# Patient Record
Sex: Female | Born: 1935 | Race: White | Hispanic: No | Marital: Married | Smoking: Never smoker
Health system: Southern US, Community
[De-identification: ages and names within clinical notes are randomized; demographics above are authoritative.]

## PROBLEM LIST (undated history)

## (undated) ENCOUNTER — Ambulatory Visit: Admission: EM | Payer: Medicare (Managed Care)

## (undated) DIAGNOSIS — K900 Celiac disease: Secondary | ICD-10-CM

## (undated) DIAGNOSIS — I809 Phlebitis and thrombophlebitis of unspecified site: Secondary | ICD-10-CM

## (undated) DIAGNOSIS — I639 Cerebral infarction, unspecified: Secondary | ICD-10-CM

## (undated) DIAGNOSIS — I10 Essential (primary) hypertension: Secondary | ICD-10-CM

## (undated) DIAGNOSIS — F419 Anxiety disorder, unspecified: Secondary | ICD-10-CM

## (undated) DIAGNOSIS — E162 Hypoglycemia, unspecified: Secondary | ICD-10-CM

## (undated) DIAGNOSIS — R112 Nausea with vomiting, unspecified: Secondary | ICD-10-CM

## (undated) DIAGNOSIS — M1990 Unspecified osteoarthritis, unspecified site: Secondary | ICD-10-CM

## (undated) DIAGNOSIS — H269 Unspecified cataract: Secondary | ICD-10-CM

## (undated) DIAGNOSIS — E039 Hypothyroidism, unspecified: Secondary | ICD-10-CM

## (undated) DIAGNOSIS — Z79890 Hormone replacement therapy (postmenopausal): Secondary | ICD-10-CM

## (undated) DIAGNOSIS — H409 Unspecified glaucoma: Secondary | ICD-10-CM

## (undated) DIAGNOSIS — M419 Scoliosis, unspecified: Secondary | ICD-10-CM

## (undated) DIAGNOSIS — T7840XA Allergy, unspecified, initial encounter: Secondary | ICD-10-CM

## (undated) DIAGNOSIS — D099 Carcinoma in situ, unspecified: Secondary | ICD-10-CM

## (undated) DIAGNOSIS — K219 Gastro-esophageal reflux disease without esophagitis: Secondary | ICD-10-CM

## (undated) DIAGNOSIS — I6381 Other cerebral infarction due to occlusion or stenosis of small artery: Secondary | ICD-10-CM

## (undated) DIAGNOSIS — E785 Hyperlipidemia, unspecified: Secondary | ICD-10-CM

## (undated) DIAGNOSIS — D692 Other nonthrombocytopenic purpura: Secondary | ICD-10-CM

## (undated) DIAGNOSIS — N3281 Overactive bladder: Secondary | ICD-10-CM

## (undated) DIAGNOSIS — I7300 Raynaud's syndrome without gangrene: Secondary | ICD-10-CM

## (undated) DIAGNOSIS — M810 Age-related osteoporosis without current pathological fracture: Secondary | ICD-10-CM

## (undated) DIAGNOSIS — O2230 Deep phlebothrombosis in pregnancy, unspecified trimester: Secondary | ICD-10-CM

## (undated) DIAGNOSIS — J302 Other seasonal allergic rhinitis: Secondary | ICD-10-CM

## (undated) DIAGNOSIS — Z9889 Other specified postprocedural states: Secondary | ICD-10-CM

## (undated) DIAGNOSIS — E7800 Pure hypercholesterolemia, unspecified: Secondary | ICD-10-CM

## (undated) HISTORY — DX: Hormone replacement therapy: Z79.890

## (undated) HISTORY — DX: Other seasonal allergic rhinitis: J30.2

## (undated) HISTORY — DX: Essential (primary) hypertension: I10

## (undated) HISTORY — DX: Unspecified glaucoma: H40.9

## (undated) HISTORY — DX: Overactive bladder: N32.81

## (undated) HISTORY — DX: Raynaud's syndrome without gangrene: I73.00

## (undated) HISTORY — DX: Allergy, unspecified, initial encounter: T78.40XA

## (undated) HISTORY — DX: Age-related osteoporosis without current pathological fracture: M81.0

## (undated) HISTORY — DX: Cerebral infarction, unspecified: I63.9

## (undated) HISTORY — DX: Deep phlebothrombosis in pregnancy, unspecified trimester: O22.30

## (undated) HISTORY — PX: OTHER SURGICAL HISTORY: SHX169

## (undated) HISTORY — DX: Hyperlipidemia, unspecified: E78.5

## (undated) HISTORY — DX: Unspecified osteoarthritis, unspecified site: M19.90

## (undated) HISTORY — DX: Gastro-esophageal reflux disease without esophagitis: K21.9

## (undated) HISTORY — DX: Hypoglycemia, unspecified: E16.2

## (undated) HISTORY — DX: Celiac disease: K90.0

## (undated) HISTORY — DX: Unspecified cataract: H26.9

## (undated) HISTORY — DX: Anxiety disorder, unspecified: F41.9

## (undated) HISTORY — DX: Scoliosis, unspecified: M41.9

## (undated) HISTORY — DX: Hypothyroidism, unspecified: E03.9

## (undated) HISTORY — DX: Other nonthrombocytopenic purpura: D69.2

## (undated) HISTORY — PX: CATARACT EXTRACTION, BILATERAL: SHX1313

## (undated) HISTORY — PX: VARICOSE VEIN SURGERY: SHX832

## (undated) HISTORY — DX: Phlebitis and thrombophlebitis of unspecified site: I80.9

## (undated) HISTORY — DX: Other cerebral infarction due to occlusion or stenosis of small artery: I63.81

## (undated) HISTORY — DX: Pure hypercholesterolemia, unspecified: E78.00

## (undated) HISTORY — DX: Carcinoma in situ, unspecified: D09.9

---

## 1977-10-04 HISTORY — PX: ABDOMINAL HYSTERECTOMY: SUR658

## 1998-06-28 ENCOUNTER — Emergency Department (HOSPITAL_COMMUNITY): Admission: EM | Admit: 1998-06-28 | Discharge: 1998-06-28 | Payer: Self-pay | Admitting: Emergency Medicine

## 2002-07-05 ENCOUNTER — Encounter: Payer: Self-pay | Admitting: Chiropractic Medicine

## 2002-07-05 ENCOUNTER — Encounter: Admission: RE | Admit: 2002-07-05 | Discharge: 2002-07-05 | Payer: Self-pay | Admitting: Chiropractic Medicine

## 2003-02-06 ENCOUNTER — Encounter: Admission: RE | Admit: 2003-02-06 | Discharge: 2003-02-06 | Payer: Self-pay | Admitting: Chiropractic Medicine

## 2003-02-06 ENCOUNTER — Encounter: Payer: Self-pay | Admitting: Chiropractic Medicine

## 2008-01-30 DIAGNOSIS — D099 Carcinoma in situ, unspecified: Secondary | ICD-10-CM

## 2008-01-30 HISTORY — DX: Carcinoma in situ, unspecified: D09.9

## 2010-02-17 ENCOUNTER — Encounter: Admission: RE | Admit: 2010-02-17 | Discharge: 2010-02-17 | Payer: Self-pay | Admitting: *Deleted

## 2010-06-04 ENCOUNTER — Encounter: Admission: RE | Admit: 2010-06-04 | Discharge: 2010-06-04 | Payer: Self-pay | Admitting: Family Medicine

## 2012-09-29 ENCOUNTER — Encounter: Payer: Self-pay | Admitting: Internal Medicine

## 2013-06-01 ENCOUNTER — Encounter: Payer: Self-pay | Admitting: Internal Medicine

## 2014-01-29 ENCOUNTER — Other Ambulatory Visit: Payer: Self-pay | Admitting: Internal Medicine

## 2014-01-29 DIAGNOSIS — Z1231 Encounter for screening mammogram for malignant neoplasm of breast: Secondary | ICD-10-CM

## 2014-02-14 ENCOUNTER — Ambulatory Visit
Admission: RE | Admit: 2014-02-14 | Discharge: 2014-02-14 | Disposition: A | Discharge status: Home / Self Care | Payer: Medicare Other | Source: Ambulatory Visit | Attending: Internal Medicine | Admitting: Internal Medicine

## 2014-02-14 ENCOUNTER — Encounter (INDEPENDENT_AMBULATORY_CARE_PROVIDER_SITE_OTHER): Payer: Self-pay

## 2014-02-14 ENCOUNTER — Other Ambulatory Visit: Payer: Self-pay | Admitting: Internal Medicine

## 2014-02-14 DIAGNOSIS — Z1231 Encounter for screening mammogram for malignant neoplasm of breast: Secondary | ICD-10-CM

## 2017-02-08 ENCOUNTER — Encounter: Payer: Self-pay | Admitting: Gastroenterology

## 2017-03-10 ENCOUNTER — Encounter: Payer: Self-pay | Admitting: *Deleted

## 2017-03-17 ENCOUNTER — Ambulatory Visit: Payer: Self-pay | Admitting: Gastroenterology

## 2017-04-28 ENCOUNTER — Encounter: Payer: Self-pay | Admitting: *Deleted

## 2017-04-28 ENCOUNTER — Other Ambulatory Visit: Payer: Self-pay | Admitting: *Deleted

## 2017-05-12 ENCOUNTER — Ambulatory Visit: Payer: Self-pay | Admitting: Gastroenterology

## 2017-12-05 ENCOUNTER — Other Ambulatory Visit: Payer: Self-pay | Admitting: Podiatry

## 2017-12-05 ENCOUNTER — Other Ambulatory Visit: Payer: Self-pay

## 2017-12-05 ENCOUNTER — Encounter: Payer: Self-pay | Admitting: Podiatry

## 2017-12-05 ENCOUNTER — Ambulatory Visit: Payer: Medicare Other | Admitting: Podiatry

## 2017-12-05 ENCOUNTER — Ambulatory Visit (INDEPENDENT_AMBULATORY_CARE_PROVIDER_SITE_OTHER): Payer: Medicare Other

## 2017-12-05 DIAGNOSIS — R52 Pain, unspecified: Secondary | ICD-10-CM

## 2017-12-05 DIAGNOSIS — M779 Enthesopathy, unspecified: Secondary | ICD-10-CM

## 2017-12-05 DIAGNOSIS — M4120 Other idiopathic scoliosis, site unspecified: Secondary | ICD-10-CM

## 2017-12-05 DIAGNOSIS — M778 Other enthesopathies, not elsewhere classified: Secondary | ICD-10-CM

## 2017-12-05 DIAGNOSIS — M7752 Other enthesopathy of left foot: Secondary | ICD-10-CM

## 2017-12-05 DIAGNOSIS — M7751 Other enthesopathy of right foot: Secondary | ICD-10-CM | POA: Diagnosis not present

## 2017-12-05 NOTE — Progress Notes (Signed)
   Subjective:    Patient ID: Caroline [name], female    DOB: 1935/12/14, 82 y.o.   MRN: [id number]  HPI    Review of Systems  All other systems reviewed and are negative.      Objective:   Physical Exam        Assessment & Plan:

## 2017-12-06 NOTE — Progress Notes (Signed)
   HPI: 82 year old female presenting today as a new patient with a chief complaint of pain to the forefoot and toes bilaterally that began about 4 months ago. She reports associated swelling of the areas. She states [facility] to [facility] her granddaughter race which aggravated her symptoms. She has not done anything for treatment. Patient is here for further evaluation and treatment.   Past Medical History:  Diagnosis Date  . Anxiety   . Celiac sprue   . GERD (gastroesophageal reflux disease)   . Hormone replacement therapy (HRT)   . Hypercholesteremia   . Hypothyroidism   . OAB (overactive bladder)   . Osteoporosis   . Raynaud disease   . Seasonal allergies   . Senile purpura (HCC)   . Thrombophlebitis      Physical Exam: General: The patient is alert and oriented x3 in no acute distress.  Dermatology: Skin is warm, dry and supple bilateral lower extremities. Negative for open lesions or macerations.  Vascular: Palpable pedal pulses bilaterally. No edema or erythema noted. Capillary refill within normal limits.  Neurological: Epicritic and protective threshold grossly intact bilaterally.   Musculoskeletal Exam: Pain with palpation to the 2nd MPJ of the left foot. Range of motion within normal limits to all pedal and ankle joints bilateral. Muscle strength 5/5 in all groups bilateral.   Radiographic Exam:  Normal osseous mineralization. Joint spaces preserved. No fracture/dislocation/boney destruction.    Assessment: - 2nd MPJ capsulitis left - Idiopathic scoliosis    Plan of Care:  - Patient evaluated. X-Rays reviewed.  - Injection of 0.5 mLs Celestone Soluspan injected into the 2nd MPJ of the left foot.  - Appointment with Raiford Nobleick for custom molded orthotics.  - Recommended new shoes from [employer].  - Return to clinic as needed.    Felecia ShellingBrent M. Grace Haggart, DPM [facility]  Dr. Felecia ShellingBrent M. Berna Gitto, DPM    [address]                                         [place], [place] [address]                Office [phone]  Fax [phone]

## 2018-01-02 ENCOUNTER — Other Ambulatory Visit: Payer: Medicare Other | Admitting: Orthotics

## 2018-01-02 ENCOUNTER — Ambulatory Visit: Payer: Medicare Other | Admitting: Podiatry

## 2018-02-01 DIAGNOSIS — I639 Cerebral infarction, unspecified: Secondary | ICD-10-CM

## 2018-02-01 HISTORY — DX: Cerebral infarction, unspecified: I63.9

## 2018-02-07 ENCOUNTER — Inpatient Hospital Stay (HOSPITAL_COMMUNITY)
Admission: EM | Admit: 2018-02-07 | Discharge: 2018-02-10 | DRG: 064 | Disposition: A | Discharge status: Home Health | Payer: Medicare Other | Attending: Internal Medicine | Admitting: Internal Medicine

## 2018-02-07 ENCOUNTER — Emergency Department (HOSPITAL_COMMUNITY): Payer: Medicare Other

## 2018-02-07 ENCOUNTER — Encounter (HOSPITAL_COMMUNITY): Payer: Self-pay

## 2018-02-07 DIAGNOSIS — Z90710 Acquired absence of both cervix and uterus: Secondary | ICD-10-CM

## 2018-02-07 DIAGNOSIS — W19XXXA Unspecified fall, initial encounter: Secondary | ICD-10-CM | POA: Diagnosis present

## 2018-02-07 DIAGNOSIS — E7800 Pure hypercholesterolemia, unspecified: Secondary | ICD-10-CM | POA: Diagnosis present

## 2018-02-07 DIAGNOSIS — G9341 Metabolic encephalopathy: Secondary | ICD-10-CM | POA: Diagnosis present

## 2018-02-07 DIAGNOSIS — Z8672 Personal history of thrombophlebitis: Secondary | ICD-10-CM

## 2018-02-07 DIAGNOSIS — N3000 Acute cystitis without hematuria: Secondary | ICD-10-CM

## 2018-02-07 DIAGNOSIS — Z7982 Long term (current) use of aspirin: Secondary | ICD-10-CM

## 2018-02-07 DIAGNOSIS — I6381 Other cerebral infarction due to occlusion or stenosis of small artery: Secondary | ICD-10-CM

## 2018-02-07 DIAGNOSIS — R4781 Slurred speech: Secondary | ICD-10-CM | POA: Diagnosis present

## 2018-02-07 DIAGNOSIS — M810 Age-related osteoporosis without current pathological fracture: Secondary | ICD-10-CM | POA: Diagnosis present

## 2018-02-07 DIAGNOSIS — R4182 Altered mental status, unspecified: Secondary | ICD-10-CM | POA: Diagnosis not present

## 2018-02-07 DIAGNOSIS — K219 Gastro-esophageal reflux disease without esophagitis: Secondary | ICD-10-CM | POA: Diagnosis present

## 2018-02-07 DIAGNOSIS — I639 Cerebral infarction, unspecified: Secondary | ICD-10-CM | POA: Diagnosis not present

## 2018-02-07 DIAGNOSIS — I10 Essential (primary) hypertension: Secondary | ICD-10-CM | POA: Diagnosis present

## 2018-02-07 DIAGNOSIS — K900 Celiac disease: Secondary | ICD-10-CM | POA: Diagnosis present

## 2018-02-07 DIAGNOSIS — Y92000 Kitchen of unspecified non-institutional (private) residence as  the place of occurrence of the external cause: Secondary | ICD-10-CM

## 2018-02-07 DIAGNOSIS — R29700 NIHSS score 0: Secondary | ICD-10-CM | POA: Diagnosis present

## 2018-02-07 DIAGNOSIS — Z79890 Hormone replacement therapy (postmenopausal): Secondary | ICD-10-CM

## 2018-02-07 DIAGNOSIS — B9620 Unspecified Escherichia coli [E. coli] as the cause of diseases classified elsewhere: Secondary | ICD-10-CM | POA: Diagnosis present

## 2018-02-07 DIAGNOSIS — Z79899 Other long term (current) drug therapy: Secondary | ICD-10-CM

## 2018-02-07 DIAGNOSIS — R296 Repeated falls: Secondary | ICD-10-CM | POA: Diagnosis present

## 2018-02-07 DIAGNOSIS — Z1611 Resistance to penicillins: Secondary | ICD-10-CM | POA: Diagnosis present

## 2018-02-07 DIAGNOSIS — E785 Hyperlipidemia, unspecified: Secondary | ICD-10-CM

## 2018-02-07 DIAGNOSIS — E039 Hypothyroidism, unspecified: Secondary | ICD-10-CM | POA: Diagnosis present

## 2018-02-07 DIAGNOSIS — N390 Urinary tract infection, site not specified: Secondary | ICD-10-CM | POA: Diagnosis present

## 2018-02-07 DIAGNOSIS — R29810 Facial weakness: Secondary | ICD-10-CM | POA: Diagnosis present

## 2018-02-07 LAB — COMPREHENSIVE METABOLIC PANEL
ALT: 19 U/L (ref 14–54)
AST: 34 U/L (ref 15–41)
Albumin: 4 g/dL (ref 3.5–5.0)
Alkaline Phosphatase: 92 U/L (ref 38–126)
Anion gap: 8 (ref 5–15)
BUN: 18 mg/dL (ref 6–20)
CO2: 25 mmol/L (ref 22–32)
Calcium: 9.1 mg/dL (ref 8.9–10.3)
Chloride: 107 mmol/L (ref 101–111)
Creatinine, Ser: 0.93 mg/dL (ref 0.44–1.00)
GFR calc Af Amer: >60 mL/min (ref >60)
GFR calc non Af Amer: 57 mL/min — ABNORMAL LOW (ref >60)
Glucose, Bld: 92 mg/dL (ref 65–99)
Potassium: 4.1 mmol/L (ref 3.5–5.1)
Sodium: 140 mmol/L (ref 135–145)
Total Bilirubin: 0.8 mg/dL (ref 0.3–1.2)
Total Protein: 6.8 g/dL (ref 6.5–8.1)

## 2018-02-07 LAB — CBC
HCT: 40.1 % (ref 36.0–46.0)
Hemoglobin: 13.4 g/dL (ref 12.0–15.0)
MCH: 31 pg (ref 26.0–34.0)
MCHC: 33.4 g/dL (ref 30.0–36.0)
MCV: 92.8 fL (ref 78.0–100.0)
Platelets: 305 10*3/uL (ref 150–400)
RBC: 4.32 MIL/uL (ref 3.87–5.11)
RDW: 14.4 % (ref 11.5–15.5)
WBC: 8.4 10*3/uL (ref 4.0–10.5)

## 2018-02-07 LAB — DIFFERENTIAL
Basophils Absolute: 0.1 10*3/uL (ref 0.0–0.1)
Basophils Relative: 1 %
Eosinophils Absolute: 0.3 10*3/uL (ref 0.0–0.7)
Eosinophils Relative: 3 %
Lymphocytes Relative: 28 %
Lymphs Abs: 2.4 10*3/uL (ref 0.7–4.0)
Monocytes Absolute: 0.5 10*3/uL (ref 0.1–1.0)
Monocytes Relative: 6 %
Neutro Abs: 5.2 10*3/uL (ref 1.7–7.7)
Neutrophils Relative %: 62 %

## 2018-02-07 LAB — RAPID URINE DRUG SCREEN, HOSP PERFORMED
Amphetamines: NOT DETECTED
Barbiturates: NOT DETECTED
Benzodiazepines: NOT DETECTED
Cocaine: NOT DETECTED
Opiates: NOT DETECTED
Tetrahydrocannabinol: NOT DETECTED

## 2018-02-07 LAB — I-STAT CHEM 8, ED
BUN: 22 mg/dL — ABNORMAL HIGH (ref 6–20)
Calcium, Ion: 1.13 mmol/L — ABNORMAL LOW (ref 1.15–1.40)
Chloride: 106 mmol/L (ref 101–111)
Creatinine, Ser: 0.9 mg/dL (ref 0.44–1.00)
Glucose, Bld: 90 mg/dL (ref 65–99)
HCT: 41 % (ref 36.0–46.0)
Hemoglobin: 13.9 g/dL (ref 12.0–15.0)
Potassium: 3.8 mmol/L (ref 3.5–5.1)
Sodium: 141 mmol/L (ref 135–145)
TCO2: 26 mmol/L (ref 22–32)

## 2018-02-07 LAB — APTT: aPTT: 30 seconds (ref 24–36)

## 2018-02-07 LAB — URINALYSIS, ROUTINE W REFLEX MICROSCOPIC
Bilirubin Urine: NEGATIVE
Glucose, UA: NEGATIVE mg/dL
Ketones, ur: NEGATIVE mg/dL
Nitrite: POSITIVE — AB
Protein, ur: NEGATIVE mg/dL
Specific Gravity, Urine: 1.018 (ref 1.005–1.030)
WBC, UA: >50 WBC/hpf — ABNORMAL HIGH (ref 0–5)
pH: 5 (ref 5.0–8.0)

## 2018-02-07 LAB — I-STAT TROPONIN, ED: Troponin i, poc: 0 ng/mL (ref 0.00–0.08)

## 2018-02-07 LAB — PROTIME-INR
INR: 1.04
Prothrombin Time: 13.5 seconds (ref 11.4–15.2)

## 2018-02-07 LAB — ETHANOL: Alcohol, Ethyl (B): <10 mg/dL (ref <10)

## 2018-02-07 NOTE — ED Provider Notes (Signed)
Patient placed in Quick Look pathway, seen and evaluated   Chief Complaint: Possible TIA  HPI:   Patient with history of hypothyroidism, high blood pressure, no anticoagulation --presents the emergency department with frequent falls today.  Patient states that she awoke in her normal state of health.  She states that around lunchtime she began feeling poorly, describes feeling tired, and laid down.  At some point she developed a frontal headache.  When she got up she fell into the side of the wall but was able to get up.  She then proceeded to drive and go perform errands.  While she was out she states that she tried to get in a stranger's car not realizing it was not hers.  Upon returning home she fell multiple times.  She states that during one fall in her kitchen she hit her head on the floor.  Patient's husband reports that he received a phone call from the patient's sister who was talking to her on the telephone.  He was told that her speech was abnormal.  He has not witnessed any abnormal speech himself.  He does state that his wife seems more so unsteady on her feet today than usual.  Patient is currently feeling better except her HA is still present, which is why she felt that she had a possible TIA.  Patient denies neck pain. Patient denies other signs of stroke including: facial droop,aphasia, weakness/numbness in extremities on one side.   ROS:  Positive ROS: (+) Weakness, fatigue, falls, HA Negative ROS: (-) neck pain, CP, SOB, abd pain  Physical Exam:   Gen: No distress  Neuro: Awake and Alert  Skin: Warm    Focused Exam: Neck no carotid bruits; Heart RRR, nml S1,S2, no m/r/g; Lungs CTAB; Abd soft, NT, no rebound or guarding; Ext 2+ pedal pulses bilaterally, no edema; Neuro cranial nerves II through XII grossly intact, upper and lower extremity sensation and strength preserved, neg Romberg.  BP (!) 162/58   Pulse 87   Temp 98.1 F (36.7 C) (Oral)   Resp 18   Ht  (1.626 m)    Wt 64.2 kg (141 lb 9.6 oz)   SpO2 97%   BMI 24.31 kg/m   Plan: Patient with nonspecific weakness, falls, and confusion symptoms today; no focal neurological deficits at time of exam.  No code stroke.  It is possible patient had a TIA today given the symptoms.  Work-up started.  Initiation of care has begun. The patient has been counseled on the process, plan, and necessity for staying for the completion/evaluation, and the remainder of the medical screening examination    Renne Crigler, Cordelia Poche 02/07/18 2045    Eber Hong, MD 02/08/18 [phone]

## 2018-02-07 NOTE — ED Triage Notes (Signed)
Pt presents with multiple falls today due to dizziness, pt reports "wicked" frontal headache and increased blood pressure all of which is normal now.  Husband reports pt has had some confusion last week which is resolved now.

## 2018-02-08 ENCOUNTER — Emergency Department (HOSPITAL_COMMUNITY): Payer: Medicare Other

## 2018-02-08 ENCOUNTER — Observation Stay (HOSPITAL_COMMUNITY): Payer: Self-pay

## 2018-02-08 ENCOUNTER — Observation Stay (HOSPITAL_COMMUNITY): Payer: Medicare Other

## 2018-02-08 ENCOUNTER — Encounter (HOSPITAL_COMMUNITY): Payer: Self-pay | Admitting: Family Medicine

## 2018-02-08 DIAGNOSIS — I639 Cerebral infarction, unspecified: Secondary | ICD-10-CM | POA: Diagnosis not present

## 2018-02-08 DIAGNOSIS — I10 Essential (primary) hypertension: Secondary | ICD-10-CM | POA: Diagnosis not present

## 2018-02-08 DIAGNOSIS — N3000 Acute cystitis without hematuria: Secondary | ICD-10-CM | POA: Insufficient documentation

## 2018-02-08 DIAGNOSIS — E039 Hypothyroidism, unspecified: Secondary | ICD-10-CM | POA: Diagnosis not present

## 2018-02-08 DIAGNOSIS — E785 Hyperlipidemia, unspecified: Secondary | ICD-10-CM

## 2018-02-08 DIAGNOSIS — I6381 Other cerebral infarction due to occlusion or stenosis of small artery: Secondary | ICD-10-CM

## 2018-02-08 DIAGNOSIS — N390 Urinary tract infection, site not specified: Secondary | ICD-10-CM | POA: Diagnosis present

## 2018-02-08 HISTORY — DX: Cerebral infarction, unspecified: I63.9

## 2018-02-08 LAB — LIPID PANEL
Cholesterol: 182 mg/dL (ref 0–200)
HDL: 64 mg/dL (ref >40)
LDL Cholesterol: 107 mg/dL — ABNORMAL HIGH (ref 0–99)
Total CHOL/HDL Ratio: 2.8 RATIO
Triglycerides: 55 mg/dL (ref <150)
VLDL: 11 mg/dL (ref 0–40)

## 2018-02-08 LAB — HEMOGLOBIN A1C
Hgb A1c MFr Bld: 5.7 % — ABNORMAL HIGH (ref 4.8–5.6)
Mean Plasma Glucose: 116.89 mg/dL

## 2018-02-08 MED ORDER — ASPIRIN 325 MG PO TABS
325.0000 mg | ORAL_TABLET | Freq: Every day | ORAL | Status: DC
  Administered 2018-02-08: 325 mg via ORAL
  Filled 2018-02-08: qty 1

## 2018-02-08 MED ORDER — SODIUM CHLORIDE 0.9 % IV SOLN
1.0000 g | INTRAVENOUS | Status: DC
  Administered 2018-02-09 – 2018-02-10 (×2): 1 g via INTRAVENOUS
  Filled 2018-02-08 (×2): qty 10

## 2018-02-08 MED ORDER — VITAMIN D 1000 UNITS PO TABS
1000.0000 [IU] | ORAL_TABLET | Freq: Every day | ORAL | Status: DC
  Administered 2018-02-08 – 2018-02-10 (×3): 1000 [IU] via ORAL
  Filled 2018-02-08 (×3): qty 1

## 2018-02-08 MED ORDER — ACETAMINOPHEN 160 MG/5ML PO SOLN: 650.0000 mg | ORAL | Status: DC | PRN

## 2018-02-08 MED ORDER — CYANOCOBALAMIN 500 MCG PO TABS
250.0000 ug | ORAL_TABLET | ORAL | Status: DC
  Administered 2018-02-08 – 2018-02-10 (×2): 250 ug via ORAL
  Filled 2018-02-08 (×2): qty 1

## 2018-02-08 MED ORDER — HYDROCODONE-ACETAMINOPHEN 5-325 MG PO TABS: 1.0000 | ORAL_TABLET | ORAL | Status: DC | PRN

## 2018-02-08 MED ORDER — SODIUM CHLORIDE 0.9 % IV SOLN
INTRAVENOUS | Status: DC
  Administered 2018-02-08: 06:00:00 via INTRAVENOUS

## 2018-02-08 MED ORDER — ENOXAPARIN SODIUM 40 MG/0.4ML ~~LOC~~ SOLN
40.0000 mg | SUBCUTANEOUS | Status: DC
  Administered 2018-02-08 – 2018-02-10 (×3): 40 mg via SUBCUTANEOUS
  Filled 2018-02-08 (×3): qty 0.4

## 2018-02-08 MED ORDER — STROKE: EARLY STAGES OF RECOVERY BOOK
Freq: Once | Status: AC
  Administered 2018-02-08: 07:00:00
  Filled 2018-02-08: qty 1

## 2018-02-08 MED ORDER — ATORVASTATIN CALCIUM 10 MG PO TABS
20.0000 mg | ORAL_TABLET | Freq: Every day | ORAL | Status: DC
  Administered 2018-02-08 – 2018-02-09 (×2): 20 mg via ORAL
  Filled 2018-02-08 (×2): qty 2

## 2018-02-08 MED ORDER — ASPIRIN EC 81 MG PO TBEC
81.0000 mg | DELAYED_RELEASE_TABLET | Freq: Every day | ORAL | Status: DC
  Administered 2018-02-09: 81 mg via ORAL
  Filled 2018-02-08: qty 1

## 2018-02-08 MED ORDER — ACETAMINOPHEN 650 MG RE SUPP: 650.0000 mg | RECTAL | Status: DC | PRN

## 2018-02-08 MED ORDER — SODIUM CHLORIDE 0.9 % IV SOLN
1.0000 g | Freq: Once | INTRAVENOUS | Status: AC
  Administered 2018-02-08: 1 g via INTRAVENOUS
  Filled 2018-02-08: qty 10

## 2018-02-08 MED ORDER — ACETAMINOPHEN 325 MG PO TABS: 650.0000 mg | ORAL_TABLET | ORAL | Status: DC | PRN

## 2018-02-08 MED ORDER — ONDANSETRON HCL 4 MG/2ML IJ SOLN
4.0000 mg | Freq: Four times a day (QID) | INTRAMUSCULAR | Status: DC | PRN
  Filled 2018-02-08: qty 2

## 2018-02-08 MED ORDER — CLOPIDOGREL BISULFATE 75 MG PO TABS
75.0000 mg | ORAL_TABLET | Freq: Every day | ORAL | Status: DC
  Administered 2018-02-08 – 2018-02-10 (×3): 75 mg via ORAL
  Filled 2018-02-08 (×3): qty 1

## 2018-02-08 MED ORDER — SENNOSIDES-DOCUSATE SODIUM 8.6-50 MG PO TABS: 1.0000 | ORAL_TABLET | Freq: Every evening | ORAL | Status: DC | PRN

## 2018-02-08 MED ORDER — LEVOTHYROXINE SODIUM 100 MCG PO TABS
100.0000 ug | ORAL_TABLET | Freq: Every day | ORAL | Status: DC
  Administered 2018-02-09 – 2018-02-10 (×2): 100 ug via ORAL
  Filled 2018-02-08 (×2): qty 1

## 2018-02-08 MED ORDER — ASPIRIN 300 MG RE SUPP
300.0000 mg | Freq: Every day | RECTAL | Status: DC
  Filled 2018-02-08: qty 1

## 2018-02-08 NOTE — H&P (Signed)
History and Physical    Caroline [name] ZOX:[id number] DOB: 1936-01-19 DOA: 02/07/2018  PCP: Gaspar Garbe, MD   Patient coming from: Home  Chief Complaint: Confusion, gait difficulty with falls, left facial droop   HPI: Caroline [name] is a 82 y.o. female with medical history significant for hypertension and hypothyroidism, now presenting to the emergency department for evaluation of confusion, gait difficulty with falls, headache, and left facial droop.  Patient reportedly had an episode of confusion last week that resolved until today at roughly 10:30 AM when she began to have difficulties including gait instability, reportedly leaning to the left side and falling multiple times, drooping of the left face with drooling, and some confusion.  She is also had a frontal headache.  Denies chest pain or palpitations and denies fevers or chills.  By the time of admission, she reports that a slight headache persist, but other symptoms have resolved.  ED Course: Upon arrival to the ED, patient is found to be afebrile, saturating well on room air, and with vitals otherwise stable.  EKG features a normal sinus rhythm and noncontrast head CT is negative for acute intracranial abnormality.  Chemistry panel and CBC are unremarkable, UDS is negative, and ethanol level is undetectable.  Urinalysis is suggestive of infection and sample was sent for culture.  MRI brain reveals a 2 cm acute ischemic nonhemorrhagic ventral right thalamic infarct.  Patient was given 1 g of IV Rocephin and neurology was consulted by the ED physician.  Patient will be admitted for ongoing evaluation and management of acute ischemic CVA.  Review of Systems:  All other systems reviewed and apart from HPI, are negative.  Past Medical History:  Diagnosis Date  . Anxiety   . Celiac sprue   . GERD (gastroesophageal reflux disease)   . Hormone replacement therapy (HRT)   . Hypercholesteremia   . Hypothyroidism   . OAB (overactive bladder)     . Osteoporosis   . Raynaud disease   . Seasonal allergies   . Senile purpura (HCC)   . Thrombophlebitis     Past Surgical History:  Procedure Laterality Date  . ABDOMINAL HYSTERECTOMY  1979  . knee surgery Left      reports that she has never smoked. She has never used smokeless tobacco. She reports that she does not drink alcohol. Her drug history is not on file.  Allergies  Allergen Reactions  . Gluten Meal Nausea And Vomiting    Patient has CELIAC DISEASE  . Wheat Bran Nausea And Vomiting    Patient has CELIAC DISEASE  . Adhesive [Tape] Other (See Comments)    Patient's skin is VERY THIN and tears very easily; please use either paper tape or Coban wrap  . Codeine Nausea And Vomiting  . Other Other (See Comments)    Patient has Hypoglycemia; her husband stated "when her sugar crashes, it crashes hard."    Family History  Problem Relation Age of Onset  . Arthritis Mother   . Hypertension Mother   . Obesity Sister      Prior to Admission medications   Medication Sig Start Date End Date Taking? Authorizing Provider  Ascorbic Acid (VITAMIN C) 100 MG tablet Take 100 mg by mouth every other day.    Yes [provider]  aspirin EC 81 MG tablet Take 81 mg by mouth at bedtime.   Yes [provider]  B Complex-Biotin-FA (VITAMIN B50 COMPLEX PO) Take 1 tablet by mouth every other day.  Yes [provider]  Cholecalciferol (VITAMIN D-3) 1000 units CAPS Take 1,000 Units by mouth daily.   Yes [provider]  Cyanocobalamin (B-12) 250 MCG TABS Take 250 mcg by mouth every other day.    Yes [provider]  ibuprofen (ADVIL,MOTRIN) 200 MG tablet Take 200 mg by mouth every 6 (six) hours as needed (for back pain).   Yes [provider]  irbesartan (AVAPRO) 150 MG tablet Take 150 mg by mouth daily.  10/11/17  Yes [provider]  levothyroxine (SYNTHROID, LEVOTHROID) 100 MCG tablet Take 100 mcg by mouth daily before  breakfast.   Yes [provider]  meloxicam (MOBIC) 15 MG tablet Take 15 mg by mouth daily as needed (for back pain).    Yes [provider]    Physical Exam: Vitals:   02/07/18 2335 02/08/18 0051 02/08/18 0222 02/08/18 0230  BP: 118/61 (!) 114/53 113/64 137/64  Pulse: 75 73 70 69  Resp: Temp: 97.7 F (36.5 C) 98.1 F (36.7 C)    TempSrc: Oral Oral    SpO2: 96% 98% 95% 96%  Weight:      Height:          Constitutional: NAD, calm  Eyes: PERTLA, lids and conjunctivae normal ENMT: Mucous membranes are moist. Posterior pharynx clear of any exudate or lesions.   Neck: normal, supple, no masses, no thyromegaly Respiratory: clear to auscultation bilaterally, no wheezing, no crackles. Normal respiratory effort.  Cardiovascular: S1 & S2 heard, regular rate and rhythm. No extremity edema. No significant JVD. Abdomen: No distension, no tenderness, soft. Bowel sounds normal.  Musculoskeletal: no clubbing / cyanosis. No joint deformity upper and lower extremities.   Skin: no significant rashes, lesions, ulcers. Warm, dry, well-perfused. Neurologic: Subtle left lower facial weakness. Sensation to light touch intact, patellar DTR normal. Strength 5/5 in all 4 limbs.  Psychiatric: Alert and oriented x 3. Pleasant and cooperative.     Labs on Admission: I have personally reviewed following labs and imaging studies  CBC: Recent Labs  Lab 02/07/18 2058 02/07/18 2135  WBC 8.4  --   NEUTROABS 5.2  --   HGB 13.4 13.9  HCT 40.1 41.0  MCV 92.8  --   PLT 305  --    Basic Metabolic Panel: Recent Labs  Lab 02/07/18 2058 02/07/18 2135  NA 140 141  K 4.1 3.8  CL 107 106  CO2 25  --   GLUCOSE 92 90  BUN 18 22*  CREATININE 0.93 0.90  CALCIUM 9.1  --    GFR: Estimated Creatinine Clearance: 43.1 mL/min (by C-G formula based on SCr of 0.9 mg/dL). Liver Function Tests: Recent Labs  Lab 02/07/18 2058  AST 34  ALT 19  ALKPHOS 92  BILITOT 0.8  PROT  6.8  ALBUMIN 4.0   No results for input(s): LIPASE, AMYLASE in the last 168 hours. No results for input(s): AMMONIA in the last 168 hours. Coagulation Profile: Recent Labs  Lab 02/07/18 2058  INR 1.04   Cardiac Enzymes: No results for input(s): CKTOTAL, CKMB, CKMBINDEX, TROPONINI in the last 168 hours. BNP (last 3 results) No results for input(s): PROBNP in the last 8760 hours. HbA1C: No results for input(s): HGBA1C in the last 72 hours. CBG: No results for input(s): GLUCAP in the last 168 hours. Lipid Profile: No results for input(s): CHOL, HDL, LDLCALC, TRIG, CHOLHDL, LDLDIRECT in the last 72 hours. Thyroid Function Tests: No results for input(s): TSH, T4TOTAL, FREET4,  T3FREE, THYROIDAB in the last 72 hours. Anemia Panel: No results for input(s): VITAMINB12, FOLATE, FERRITIN, TIBC, IRON, RETICCTPCT in the last 72 hours. Urine analysis:    Component Value Date/Time   COLORURINE YELLOW 02/07/2018 2046   APPEARANCEUR CLOUDY (A) 02/07/2018 2046   LABSPEC 1.018 02/07/2018 2046   PHURINE 5.0 02/07/2018 2046   GLUCOSEU NEGATIVE 02/07/2018 2046   HGBUR SMALL (A) 02/07/2018 2046   BILIRUBINUR NEGATIVE 02/07/2018 2046   KETONESUR NEGATIVE 02/07/2018 2046   PROTEINUR NEGATIVE 02/07/2018 2046   NITRITE POSITIVE (A) 02/07/2018 2046   LEUKOCYTESUR LARGE (A) 02/07/2018 2046   Sepsis Labs: (procalcitonin:4,lacticidven:4) )No results found for this or any previous visit (from the past 240 hour(s)).   Radiological Exams on Admission: Ct Head Wo Contrast  Result Date: 02/07/2018 CLINICAL DATA:  82 year old female with headache and dizziness. EXAM: CT HEAD WITHOUT CONTRAST TECHNIQUE: Contiguous axial images were obtained from the base of the skull through the vertex without intravenous contrast. COMPARISON:  None. FINDINGS: Brain: There is mild age-related atrophy. Moderate periventricular and deep white matter chronic microvascular ischemic changes noted. There is no acute  intracranial hemorrhage. No mass effect or midline shift noted. No extra-axial fluid collection. Vascular: No hyperdense vessel or unexpected calcification. Skull: Normal. Negative for fracture or focal lesion. Sinuses/Orbits: No acute finding. Other: None IMPRESSION: 1. No acute intracranial hemorrhage. 2. Age-related atrophy and chronic microvascular ischemic changes. Electronically Signed   By: Elgie Collard M.D.   On: 02/07/2018 21:56   Mr Brain Wo Contrast  Result Date: 02/08/2018 CLINICAL DATA:  Initial evaluation for acute altered mental status, frequent falls. EXAM: MRI HEAD WITHOUT CONTRAST TECHNIQUE: Multiplanar, multiecho pulse sequences of the brain and surrounding structures were obtained without intravenous contrast. COMPARISON:  Prior CT from 02/07/2018. FINDINGS: Brain: Generalized age-related cerebral atrophy. Patchy and confluent T2/FLAIR hyperintensity within the periventricular and deep white matter both cerebral hemispheres most consistent with chronic small vessel ischemic change, moderate in nature There is an acute ischemic nonhemorrhagic infarct involving the ventral right thalamus measuring 6 x 12 x 21 mm (series 5001, image 65). No associated mass effect. No other evidence for acute or subacute ischemia. Gray-white matter differentiation otherwise maintained. No acute or chronic intracranial hemorrhage. No mass lesion, midline shift or mass effect. Mild ventricular prominence related global parenchymal volume loss of hydrocephalus. No extra-axial fluid collection. Major dural sinuses are grossly patent. Pituitary gland suprasellar region normal. Midline structures intact and normal. Vascular: Major intracranial vascular flow voids are maintained. Skull and upper cervical spine: Craniocervical junction within normal limits. Upper cervical spine normal. Bone marrow signal intensity within normal limits. No scalp soft tissue abnormality. Sinuses/Orbits: Globes and orbital soft tissues  within normal limits. Patient status post lens extraction bilaterally. Paranasal sinuses are clear. No significant mastoid effusion. Inner ear structures normal. Other: None. IMPRESSION: 1. Approximate 2 cm acute ischemic nonhemorrhagic ventral right thalamic infarct. 2. Underlying moderate chronic small vessel ischemic disease. Electronically Signed   By: Rise Mu M.D.   On: 02/08/2018 04:15    EKG: Independently reviewed. Normal sinus rhythm.   Assessment/Plan   1. Acute ischemic CVA  - Presents with confusion, unsteady gait with falls, slight left facial droop with onset ~10:30 am on 02/07/18  - Head CT is negative for acute findings but MRI brain reveals acute ischemic infarct involving ventral right thalamus  - Neurology is consulting and much appreciated  - Continue cardiac monitoring with frequent neuro checks, PT/OT/SLP evals  - Check MRA head, carotid  dopplers, echocardiogram, fasting lipids, and A1c  - Increase ASA to 325 mg    2. UTI  - Sample sent for culture and empiric Rocephin started  - Continue current abx pending culture data and clinical course    3. Hypertension  - BP at goal  - Hold ARB during acute-phase ischemic CVA    4. Hypothyroidism  - Continue Synthroid    DVT prophylaxis: Lovenox Code Status: Full  Family Communication: Discussed with patient Consults called: Neurology Admission status: Observation     Briscoe Deutscher, MD [facility] Pager [phone]  If 7PM-7AM, please contact night-coverage [email or web address] Password [email or web address]  02/08/2018, 5:04 AM

## 2018-02-08 NOTE — Progress Notes (Addendum)
Patient admitted after midnight,  She was found to have a new CVA on MRI.  Work up in progress including echo and carotids.  PT/OT eval. ? UTI-- was started on abx- no symptoms of frequency, dysuria etc per pateint Marlin Canary DO

## 2018-02-08 NOTE — ED Provider Notes (Signed)
[facility] EMERGENCY DEPARTMENT Provider Note   CSN: [id number] Arrival date & time: 02/07/18  2015     History   Chief Complaint No chief complaint on file.   HPI Caroline [name] is a 82 y.o. female.  HPI  This is an 82 year old female with a history of high blood pressure, hypothyroidism who presents with confusion and frequent falls.  Patient reports that she woke up this morning feeling normally.  However around 10:30 AM she started to feel poorly.  She states she felt generally fatigued and tired.  She laid down for nap.  She did drive herself to errands this afternoon.  However, she felt confused and attempted to get in someone else's car.  Upon getting home, she got out of her car and felt lightheaded.  She states that she fell into a wall.  She had multiple subsequent falls.  She denies unilateral weakness but generalized weakness of the legs.  Some question of whether she had some slurred speech.  Patient and husband states that they have not noted any slurred speech.  And has noted a droop of the left side of her mouth and she was drooling.  She currently feels much better.  She does have a frontal headache.  She fell once in the kitchen hitting her head.  She denies any recent fevers, back pain.  She denies chest pain, shortness of breath, abdominal pain.  Of note, informed by nursing that when she got up to ambulate she was noted to lean to the left and appeared unsteady.  Past Medical History:  Diagnosis Date  . Anxiety   . Celiac sprue   . GERD (gastroesophageal reflux disease)   . Hormone replacement therapy (HRT)   . Hypercholesteremia   . Hypothyroidism   . OAB (overactive bladder)   . Osteoporosis   . Raynaud disease   . Seasonal allergies   . Senile purpura (HCC)   . Thrombophlebitis     There are no active problems to display for this patient.   Past Surgical History:  Procedure Laterality Date  . ABDOMINAL HYSTERECTOMY  1979  . knee surgery  Left      OB History   None      Home Medications    Prior to Admission medications   Medication Sig Start Date End Date Taking? Authorizing Provider  Ascorbic Acid (VITAMIN C) 100 MG tablet Take 100 mg by mouth every other day.    Yes [provider]  aspirin EC 81 MG tablet Take 81 mg by mouth at bedtime.   Yes [provider]  B Complex-Biotin-FA (VITAMIN B50 COMPLEX PO) Take 1 tablet by mouth every other day.   Yes [provider]  Cholecalciferol (VITAMIN D-3) 1000 units CAPS Take 1,000 Units by mouth daily.   Yes [provider]  Cyanocobalamin (B-12) 250 MCG TABS Take 250 mcg by mouth every other day.    Yes [provider]  ibuprofen (ADVIL,MOTRIN) 200 MG tablet Take 200 mg by mouth every 6 (six) hours as needed (for back pain).   Yes [provider]  irbesartan (AVAPRO) 150 MG tablet Take 150 mg by mouth daily.  10/11/17  Yes [provider]  levothyroxine (SYNTHROID, LEVOTHROID) 100 MCG tablet Take 100 mcg by mouth daily before breakfast.   Yes [provider]  meloxicam (MOBIC) 15 MG tablet Take 15 mg by mouth daily as needed (for back pain).    Yes [provider]  Multiple  Vitamin (MULTIVITAMIN) tablet Take 1 tablet by mouth daily.    [provider]    Family History Family History  Problem Relation Age of Onset  . Arthritis Mother   . Hypertension Mother   . Obesity Sister     Social History Social History   Tobacco Use  . Smoking status: Never Smoker  . Smokeless tobacco: Never Used  Substance Use Topics  . Alcohol use: No  . Drug use: Not on file     Allergies   Gluten meal; Wheat bran; Adhesive [tape]; Codeine; and Other   Review of Systems Review of Systems  Constitutional: Negative for fever.  Respiratory: Negative for shortness of breath.   Cardiovascular: Negative for chest pain.  Gastrointestinal: Negative for abdominal pain.  Genitourinary: Negative  for dysuria.  Musculoskeletal: Negative for back pain.  Neurological: Positive for dizziness, speech difficulty, light-headedness and headaches.  Psychiatric/Behavioral: Positive for confusion.  All other systems reviewed and are negative.    Physical Exam Updated Vital Signs BP 113/64   Pulse 70   Temp 98.1 F (36.7 C) (Oral)   Resp 20   Ht  (1.626 m)   Wt 64.2 kg (141 lb 9.6 oz)   SpO2 95%   BMI 24.31 kg/m   Physical Exam  Constitutional: She is oriented to person, place, and time.  Elderly, nontoxic, no acute distress  HENT:  Head: Normocephalic and atraumatic.  Eyes: Pupils are equal, round, and reactive to light. EOM are normal.  Neck: Neck supple.  Cardiovascular: Normal rate, regular rhythm and normal heart sounds.  No murmur heard. Pulmonary/Chest: Effort normal and breath sounds normal. No respiratory distress. She has no wheezes.  Abdominal: Soft. Bowel sounds are normal. There is no tenderness.  Neurological: She is alert and oriented to person, place, and time.  Slight droop of the left side of the mouth which is overcome with intention, otherwise cranial nerves intact, 5 out of 5 strength in all 4 extremities, no dysmetria to finger-nose-finger, gait testing deferred  Skin: Skin is warm and dry.  Psychiatric: She has a normal mood and affect.  Nursing note and vitals reviewed.    ED Treatments / Results  Labs (all labs ordered are listed, but only abnormal results are displayed) Labs Reviewed  COMPREHENSIVE METABOLIC PANEL - Abnormal; Notable for the following components:      Result Value   GFR calc non Af Amer 57 (*)    All other components within normal limits  URINALYSIS, ROUTINE W REFLEX MICROSCOPIC - Abnormal; Notable for the following components:   APPearance CLOUDY (*)    Hgb urine dipstick SMALL (*)    Nitrite POSITIVE (*)    Leukocytes, UA LARGE (*)    WBC, UA >50 (*)    Bacteria, UA FEW (*)    All other components within normal  limits  I-STAT CHEM 8, ED - Abnormal; Notable for the following components:   BUN 22 (*)    Calcium, Ion 1.13 (*)    All other components within normal limits  URINE CULTURE  PROTIME-INR  APTT  CBC  DIFFERENTIAL  ETHANOL  RAPID URINE DRUG SCREEN, HOSP PERFORMED  I-STAT TROPONIN, ED  CBG MONITORING, ED  I-STAT CHEM 8, ED  I-STAT TROPONIN, ED    EKG None  Radiology Ct Head Wo Contrast  Result Date: 02/07/2018 CLINICAL DATA:  82 year old female with headache and dizziness. EXAM: CT HEAD WITHOUT CONTRAST TECHNIQUE: Contiguous axial images were obtained from the base of the  skull through the vertex without intravenous contrast. COMPARISON:  None. FINDINGS: Brain: There is mild age-related atrophy. Moderate periventricular and deep white matter chronic microvascular ischemic changes noted. There is no acute intracranial hemorrhage. No mass effect or midline shift noted. No extra-axial fluid collection. Vascular: No hyperdense vessel or unexpected calcification. Skull: Normal. Negative for fracture or focal lesion. Sinuses/Orbits: No acute finding. Other: None IMPRESSION: 1. No acute intracranial hemorrhage. 2. Age-related atrophy and chronic microvascular ischemic changes. Electronically Signed   By: Elgie Collard M.D.   On: 02/07/2018 21:56   Mr Brain Wo Contrast  Result Date: 02/08/2018 CLINICAL DATA:  Initial evaluation for acute altered mental status, frequent falls. EXAM: MRI HEAD WITHOUT CONTRAST TECHNIQUE: Multiplanar, multiecho pulse sequences of the brain and surrounding structures were obtained without intravenous contrast. COMPARISON:  Prior CT from 02/07/2018. FINDINGS: Brain: Generalized age-related cerebral atrophy. Patchy and confluent T2/FLAIR hyperintensity within the periventricular and deep white matter both cerebral hemispheres most consistent with chronic small vessel ischemic change, moderate in nature There is an acute ischemic nonhemorrhagic infarct involving the  ventral right thalamus measuring 6 x 12 x 21 mm (series 5001, image 65). No associated mass effect. No other evidence for acute or subacute ischemia. Gray-white matter differentiation otherwise maintained. No acute or chronic intracranial hemorrhage. No mass lesion, midline shift or mass effect. Mild ventricular prominence related global parenchymal volume loss of hydrocephalus. No extra-axial fluid collection. Major dural sinuses are grossly patent. Pituitary gland suprasellar region normal. Midline structures intact and normal. Vascular: Major intracranial vascular flow voids are maintained. Skull and upper cervical spine: Craniocervical junction within normal limits. Upper cervical spine normal. Bone marrow signal intensity within normal limits. No scalp soft tissue abnormality. Sinuses/Orbits: Globes and orbital soft tissues within normal limits. Patient status post lens extraction bilaterally. Paranasal sinuses are clear. No significant mastoid effusion. Inner ear structures normal. Other: None. IMPRESSION: 1. Approximate 2 cm acute ischemic nonhemorrhagic ventral right thalamic infarct. 2. Underlying moderate chronic small vessel ischemic disease. Electronically Signed   By: Rise Mu M.D.   On: 02/08/2018 04:15    Procedures Procedures (including critical care time)  Medications Ordered in ED Medications  cefTRIAXone (ROCEPHIN) 1 g in sodium chloride 0.9 % 100 mL IVPB (has no administration in time range)     Initial Impression / Assessment and Plan / ED Course  I have reviewed the triage vital signs and the nursing notes.  Pertinent labs & imaging results that were available during my care of the patient were reviewed by me and considered in my medical decision making (see chart for details).     Patient presents today with confusion, frequent falls, some concern for slurred speech and facial droop which appears to have mostly resolved.  She is overall nontoxic-appearing on exam  and vital signs are reassuring.  Neurologic exam is notable for possible slight left-sided facial droop which is overcome when the patient smiles.  I did not walk the patient as patient was reportedly very unsteady on her feet and leaning to the left.  Initial work-up reviewed from provider in triage.  CT scan of the head is negative.  She does have evidence of UTI.  Urine was cultured and patient was given IV Rocephin.  MRI ordered.  Patient is out of the TPA window as her symptoms started at 10:30 AM.  4:32 AM MRI does show evidence of an acute stroke in the right thalamus.  She is stable on repeat exam.  Will admit to  the hospitalist for formal stroke work-up.  Final Clinical Impressions(s) / ED Diagnoses   Final diagnoses:  Right thalamic stroke [facility])  Acute cystitis without hematuria    ED Discharge Orders    None       Shon Baton, MD 02/08/18 [phone]

## 2018-02-08 NOTE — Progress Notes (Signed)
Inpatient Rehabilitation  Per SLP request, patient was screened by Keviana Guida for appropriateness for an Inpatient Acute Rehab consult.  At this time we will plan to follow along for OT & PT recommendations prior to requesting an Inpatient Rehab consult.  Call if questions.   Kashara Blocher, M.A., CCC/SLP Admission Coordinator  [facility]  Cell [phone]  

## 2018-02-08 NOTE — Evaluation (Signed)
Physical Therapy Evaluation Patient Details Name: Caroline [name] MRN: [id number] DOB: 1936/01/30 Today's Date: 02/08/2018   History of Present Illness  82 y.o. female with medical history significant for hypertension and hypothyroidism who presents to the emergency room with confusion, gait difficulty, headache and left facial droop noticed by family. MRI reveals a right thalamic stroke.  Clinical Impression  PTA pt independent with ambulation with use of cane/shopping cart to ambulate community distances, and independent in iADLs. Pt currently limited by L sided weakness and decreased balance especially with gait. Pt currently supervision for bed mobility, minA for transfers and modA with a cane/minA with RW for ambulation of <1.8 feet/sec. Pt is motivated and desires to return to her PLOF making her a good candidate for CIR level rehab at d/c. PT will continue to follow acutely.      Follow Up Recommendations CIR    Equipment Recommendations  Rolling walker with 5" wheels(youth walkers)    Recommendations for Other Services       Precautions / Restrictions Precautions Precautions: Fall Precaution Comments: 6 falls yesterday prior to admission  Restrictions Weight Bearing Restrictions: No      Mobility  Bed Mobility Overal bed mobility: Needs Assistance Bed Mobility: Supine to Sit     Supine to sit: Supervision     General bed mobility comments: supervision for safety   Transfers Overall transfer level: Needs assistance Equipment used: Straight cane Transfers: Sit to/from Stand Sit to Stand: Min assist         General transfer comment: min A for steadying with cane  Ambulation/Gait Ambulation/Gait assistance: Mod assist;Min assist Ambulation Distance (Feet): 300 Feet Assistive device: Straight cane;Rolling walker (2 wheeled) Gait Pattern/deviations: Step-through pattern;Staggering left;Staggering right;Scissoring;Decreased dorsiflexion - left Gait velocity:  slowed Gait velocity interpretation: <1.8 ft/sec, indicate of risk for recurrent falls General Gait Details: modA for steadying with cane for minor scissoring and staggering L and R, switched to RW and required min A for steadying with RW for 1x LoB  Stairs Stairs: Yes Stairs assistance: Min guard Stair Management: Two rails;Alternating pattern;Forwards Number of Stairs: 5 General stair comments: min guard for safety, good steadying with bilateral rails  Wheelchair Mobility    Modified Rankin (Stroke Patients Only) Modified Rankin (Stroke Patients Only) Pre-Morbid Rankin Score: No symptoms Modified Rankin: Moderately severe disability     Balance Overall balance assessment: Needs assistance Sitting-balance support: No upper extremity supported;Feet supported Sitting balance-Leahy Scale: Good     Standing balance support: Single extremity supported Standing balance-Leahy Scale: Fair                               Pertinent Vitals/Pain Pain Assessment: 0-10 Pain Score: 6  Pain Location: L wrist OA  Pain Descriptors / Indicators: Aching;Jabbing Pain Intervention(s): Monitored during session;Repositioned    Home Living Family/patient expects to be discharged to:: Private residence Living Arrangements: Spouse/significant other Available Help at Discharge: Family;Available 24 hours/day Type of Home: House Home Access: Stairs to enter   [employer] of Steps: 1 Home Layout: Two level;Able to live on main level with bedroom/bathroom Home Equipment: Gilmer Mor - single point;Shower seat - built in      Prior Function Level of Independence: Independent with assistive device(s)         Comments: use cane or shopping cart for longer distances in community      Hand Dominance   Dominant Hand: Right    Extremity/Trunk Assessment  Upper Extremity Assessment Upper Extremity Assessment: Generalized weakness    Lower Extremity Assessment Lower  Extremity Assessment: Generalized weakness;LLE deficits/detail RLE Deficits / Details: ROM WFL, strength grossly 4/5 RLE Sensation: WNL RLE Coordination: WNL LLE Deficits / Details: ROM WFL, L hip strength grossly 3+/5, knee and ankle 4/5  LLE Sensation: WNL LLE Coordination: WNL       Communication   Communication: No difficulties  Cognition Arousal/Alertness: Awake/alert Behavior During Therapy: WFL for tasks assessed/performed Overall Cognitive Status: No family/caregiver present to determine baseline cognitive functioning                                         Assessment/Plan    PT Assessment Patient needs continued PT services  PT Problem List Decreased strength;Decreased activity tolerance;Decreased balance;Decreased mobility;Decreased safety awareness;Decreased knowledge of use of DME;Decreased cognition       PT Treatment Interventions DME instruction;Gait training;Functional mobility training;Therapeutic activities;Therapeutic exercise;Balance training;Neuromuscular re-education;Cognitive remediation;Patient/family education    PT Goals (Current goals can be found in the Care Plan section)  Acute Rehab PT Goals Patient Stated Goal: not fall PT Goal Formulation: With patient Time For Goal Achievement: 02/22/18 Potential to Achieve Goals: Good    Frequency Min 4X/week    AM-PAC PT "6 Clicks" Daily Activity  Outcome Measure Difficulty turning over in bed (including adjusting bedclothes, sheets and blankets)?: None Difficulty moving from lying on back to sitting on the side of the bed? : Unable Difficulty sitting down on and standing up from a chair with arms (e.g., wheelchair, bedside commode, etc,.)?: Unable Help needed moving to and from a bed to chair (including a wheelchair)?: A Little Help needed walking in hospital room?: A Little Help needed climbing 3-5 steps with a railing? : A Little 6 Click Score: 15    End of Session Equipment  Utilized During Treatment: Gait belt Activity Tolerance: Patient tolerated treatment well Patient left: in chair;with call bell/phone within reach;with chair alarm set Nurse Communication: Mobility status PT Visit Diagnosis: Unsteadiness on feet (R26.81);Other abnormalities of gait and mobility (R26.89)    Time: 1610-9604 PT Time Calculation (min) (ACUTE ONLY): 39 min   Charges:   PT Evaluation $PT Eval Moderate Complexity: 1 Mod PT Treatments $Gait Training: 23-37 mins   PT G Codes:        Iker Nuttall B. Beverely Risen PT, DPT Acute Rehabilitation  [phone] Pager [phone]    Caroline [name] 02/08/2018, 5:29 PM

## 2018-02-08 NOTE — ED Notes (Signed)
Called lab to add urine culture to already sent urine. They stated they would.

## 2018-02-08 NOTE — Progress Notes (Signed)
Preliminary results by tech - Carotid Duplex Completed. No evidence of a significant stenosis in bilateral carotid arteries - 1-39%. Marilynne Halsted, BS, RDMS, RVT

## 2018-02-08 NOTE — Evaluation (Signed)
Speech Language Pathology Evaluation Patient Details Name: Caroline [name] MRN: [id number] DOB: [birth year] Today's Date: 02/08/2018 Time: 0945-1000 SLP Time Calculation (min) (ACUTE ONLY): 15 min  Problem List:  Patient Active Problem List   Diagnosis Date Noted  . Hypothyroidism 02/08/2018  . Essential hypertension 02/08/2018  . Acute ischemic stroke (HCC) 02/08/2018  . Acute lower UTI 02/08/2018  . Acute cystitis without hematuria    Past Medical History:  Past Medical History:  Diagnosis Date  . Anxiety   . Celiac sprue   . GERD (gastroesophageal reflux disease)   . Hormone replacement therapy (HRT)   . Hypercholesteremia   . Hypothyroidism   . OAB (overactive bladder)   . Osteoporosis   . Raynaud disease   . Seasonal allergies   . Senile purpura (HCC)   . Thrombophlebitis    Past Surgical History:  Past Surgical History:  Procedure Laterality Date  . ABDOMINAL HYSTERECTOMY  1979  . knee surgery Left    HPI:  Caroline [name] a 82 y.o.femalewith medical history significant forhypertension and hypothyroidism, now presenting to the emergency department for evaluation of confusion, gait difficulty with falls, headache, and left facial droop. Patient reportedly had an episode of confusion last week that resolved until today at roughly 10:30 AM when she began to have difficulties including gait instability, reportedly leaning to the left side and falling multiple times, drooping of the left face with drooling, and some confusion. She is also had a frontal headache. Denies chest pain or palpitations and denies fevers or chills. By the time of admission, she reports that a slight headache persist, but other symptoms have resolved. Upon arrival to the ED, patient is found to be afebrile, saturating well on room air, and with vitals otherwise stable. EKG features a normal sinus rhythm and noncontrast head CT is negative for acute intracranial abnormality. Chemistry panel and CBC are  unremarkable, UDS is negative, and ethanol level is undetectable. Urinalysis is suggestive of infection and sample was sent for culture. MRI brain reveals a 2 cm acute ischemic nonhemorrhagic ventral right thalamic infarct. Patient was given 1 g of IV Rocephin and neurology was consulted by the ED physician. Patient will be admitted for ongoing evaluation and management of acute ischemic CVA.   Assessment / Plan / Recommendation Clinical Impression  Pt presents with cognitive impairments that impact basic problem solving, orientation to time, selective attention, intellectual awareness and reasoning. Pt with multiple interruptions during evaluation and formal cognitive evaluation not performed. However, pt was able to read clock 0945 and stated that it was "nighttime." Pt required Min to Mod A cues to problem solve that sun was shining therefore it was daytime. Recommend further cognitive assessment to ensure safe discharge home    SLP Assessment  SLP Recommendation/Assessment: Patient needs continued Speech Lanaguage Pathology Services SLP Visit Diagnosis: Cognitive communication deficit (R41.841)    Follow Up Recommendations  Inpatient Rehab    Frequency and Duration min 2x/week  2 weeks      SLP Evaluation Cognition  Overall Cognitive Status: Impaired/Different from baseline Arousal/Alertness: Awake/alert Orientation Level: Oriented to person;Disoriented to time;Oriented to place;Oriented to situation Attention: Sustained;Selective Sustained Attention: Appears intact Selective Attention: Impaired Selective Attention Impairment: Verbal complex;Functional complex Memory: Appears intact Awareness: Impaired Awareness Impairment: Intellectual impairment Problem Solving: Impaired Problem Solving Impairment: Verbal basic;Functional basic Executive Function: Reasoning Reasoning: Impaired Reasoning Impairment: Verbal basic;Functional basic Behaviors: Impulsive Safety/Judgment:  Impaired       Comprehension  Auditory Comprehension Overall Auditory Comprehension: Appears within  functional limits for tasks assessed Visual Recognition/Discrimination Discrimination: Within Function Limits Reading Comprehension Reading Status: Within funtional limits    Expression Expression Primary Mode of Expression: Verbal Verbal Expression Overall Verbal Expression: Appears within functional limits for tasks assessed Written Expression Dominant Hand: Right Written Expression: Not tested   Oral / Motor  Oral Motor/Sensory Function Overall Oral Motor/Sensory Function: Within functional limits Motor Speech Overall Motor Speech: Appears within functional limits for tasks assessed   GO                    Deondra Wigger 02/08/2018, 11:20 AM

## 2018-02-08 NOTE — Consult Note (Addendum)
Requesting Physician: Dr. Wilkie Aye    Chief Complaint: Confusion  History obtained from: Patient and Chart     HPI:                                                                                                                                       Caroline [name] is an 82 y.o. female past medical history for hypothyroidism,hypercholesterolemia presents to the emergency department with confusion, gait difficulty, headache. Patient cannot recall exact time her symptoms started. She states she felt confused and her grandson noticed a left facial droop. Per chart review around 10.30 am she started having difficulties with walking and was leaning to the left side and falling multiple times. She also complained of headache  She is almost back to her baseline for her mild headache.  Date last known well: 5.7.19 Time last known well: morning tPA Given: no, outside window  NIHSS: 0 Baseline MRS 0    Past Medical History:  Diagnosis Date  . Anxiety   . Celiac sprue   . GERD (gastroesophageal reflux disease)   . Hormone replacement therapy (HRT)   . Hypercholesteremia   . Hypothyroidism   . OAB (overactive bladder)   . Osteoporosis   . Raynaud disease   . Seasonal allergies   . Senile purpura (HCC)   . Thrombophlebitis     Past Surgical History:  Procedure Laterality Date  . ABDOMINAL HYSTERECTOMY  1979  . knee surgery Left     Family History  Problem Relation Age of Onset  . Arthritis Mother   . Hypertension Mother   . Obesity Sister    Social History:  reports that she has never smoked. She has never used smokeless tobacco. She reports that she does not drink alcohol. Her drug history is not on file.  Allergies:  Allergies  Allergen Reactions  . Gluten Meal Nausea And Vomiting    Patient has CELIAC DISEASE  . Wheat Bran Nausea And Vomiting    Patient has CELIAC DISEASE  . Adhesive [Tape] Other (See Comments)    Patient's skin is VERY THIN and tears very easily; please use  either paper tape or Coban wrap  . Codeine Nausea And Vomiting  . Other Other (See Comments)    Patient has Hypoglycemia; her husband stated "when her sugar crashes, it crashes hard."    Medications:  I reviewed home medications   ROS:                                                                                                                                     14 systems reviewed and negative except above    Examination:                                                                                                      General: Appears well-developed and well-nourished.  Psych: Affect appropriate to situation Eyes: No scleral injection HENT: No OP obstrucion Head: Normocephalic.  Cardiovascular: Normal rate and regular rhythm.  Respiratory: Effort normal and breath sounds normal to anterior ascultation GI: Soft.  No distension. There is no tenderness.  Skin: WDI   Neurological Examination Mental Status: Alert, oriented, thought content appropriate.  Speech fluent without evidence of aphasia. Able to follow 3 step commands without difficulty. Cranial Nerves: II: Visual fields grossly normal,  III,IV, VI: ptosis not present, extra-ocular motions intact bilaterally, pupils equal, round, reactive to light and accommodation V,VII: smile symmetric, facial light touch sensation normal bilaterally VIII: hearing normal bilaterally IX,X: uvula rises symmetrically XI: bilateral shoulder shrug XII: midline tongue extension Motor: Right : Upper extremity   5/5    Left:     Upper extremity   5/5  Lower extremity   5/5     Lower extremity   5/5 Tone and bulk:normal tone throughout; no atrophy noted Sensory: Pinprick and light touch intact throughout, bilaterally Deep Tendon Reflexes: 2+ and symmetric throughout Plantars: Right: downgoing   Left:  downgoing Cerebellar: normal finger-to-nose, normal rapid alternating movements and normal heel-to-shin test Gait: was not assessed     Lab Results: Basic Metabolic Panel: Recent Labs  Lab 02/07/18 2058 02/07/18 03/02/34  NA 140 141  K 4.1 3.8  CL 107 106  CO2 25  --   GLUCOSE 92 90  BUN 18 22*  CREATININE 0.93 0.90  CALCIUM 9.1  --     CBC: Recent Labs  Lab 02/07/18 2058 02/07/18 2135  WBC 8.4  --   NEUTROABS 5.2  --   HGB 13.4 13.9  HCT 40.1 41.0  MCV 92.8  --   PLT 305  --     Coagulation Studies: Recent Labs    02/07/18 02-Mar-2057  LABPROT 13.5  INR 1.04    Imaging: Ct Head Wo Contrast  Result Date: 02/07/2018 CLINICAL DATA:  82 year old female with headache and dizziness. EXAM: CT HEAD WITHOUT  CONTRAST TECHNIQUE: Contiguous axial images were obtained from the base of the skull through the vertex without intravenous contrast. COMPARISON:  None. FINDINGS: Brain: There is mild age-related atrophy. Moderate periventricular and deep white matter chronic microvascular ischemic changes noted. There is no acute intracranial hemorrhage. No mass effect or midline shift noted. No extra-axial fluid collection. Vascular: No hyperdense vessel or unexpected calcification. Skull: Normal. Negative for fracture or focal lesion. Sinuses/Orbits: No acute finding. Other: None IMPRESSION: 1. No acute intracranial hemorrhage. 2. Age-related atrophy and chronic microvascular ischemic changes. Electronically Signed   By: Elgie Collard M.D.   On: 02/07/2018 21:56   Mr Brain Wo Contrast  Result Date: 02/08/2018 CLINICAL DATA:  Initial evaluation for acute altered mental status, frequent falls. EXAM: MRI HEAD WITHOUT CONTRAST TECHNIQUE: Multiplanar, multiecho pulse sequences of the brain and surrounding structures were obtained without intravenous contrast. COMPARISON:  Prior CT from 02/07/2018. FINDINGS: Brain: Generalized age-related cerebral atrophy. Patchy and confluent T2/FLAIR hyperintensity  within the periventricular and deep white matter both cerebral hemispheres most consistent with chronic small vessel ischemic change, moderate in nature There is an acute ischemic nonhemorrhagic infarct involving the ventral right thalamus measuring 6 x 12 x 21 mm (series 5001, image 65). No associated mass effect. No other evidence for acute or subacute ischemia. Gray-white matter differentiation otherwise maintained. No acute or chronic intracranial hemorrhage. No mass lesion, midline shift or mass effect. Mild ventricular prominence related global parenchymal volume loss of hydrocephalus. No extra-axial fluid collection. Major dural sinuses are grossly patent. Pituitary gland suprasellar region normal. Midline structures intact and normal. Vascular: Major intracranial vascular flow voids are maintained. Skull and upper cervical spine: Craniocervical junction within normal limits. Upper cervical spine normal. Bone marrow signal intensity within normal limits. No scalp soft tissue abnormality. Sinuses/Orbits: Globes and orbital soft tissues within normal limits. Patient status post lens extraction bilaterally. Paranasal sinuses are clear. No significant mastoid effusion. Inner ear structures normal. Other: None. IMPRESSION: 1. Approximate 2 cm acute ischemic nonhemorrhagic ventral right thalamic infarct. 2. Underlying moderate chronic small vessel ischemic disease. Electronically Signed   By: Rise Mu M.D.   On: 02/08/2018 04:15     ASSESSMENT AND PLAN  82 y.o. female with medical history significant for hypertension and hypothyroidism who presents to the emergency room with confusion, gait difficulty, headache and left facial droop noticed by family. My brain reveals a right thalamic stroke. She is outside the window for TPA.   Right Thalamic stroke    Risk factors: HTN, HLD Etiology: - likely small vessel disease   Recommend #MRA Head and neck  #Transthoracic Echo  # Start patient on  ASA  daily and plavix  daily x 3 weeks  #Start or continue Atorvastatin 80 mg/other high intensity statin # BP goal: permissive HTN upto 220/110 mmHG # HBAIC and Lipid profile # Telemetry monitoring # Frequent neuro checks # NPO until passes stroke swallow screen  Please page stroke NP  Or  PA  Or MD from 8am -4 pm  as this patient from this time will be  followed by the stroke.   You can look them up on [email or web address]  Password [facility]    Emori Kamau [facility] Pager Number [phone]

## 2018-02-08 NOTE — Progress Notes (Addendum)
STROKE TEAM PROGRESS NOTE   INTERVAL HISTORY Patient without complaints.  Feels she is neurologically back to baseline.  States she had been out running errands yesterday when she came home with a severe headache and very thirsty.  She drank 2 bottles of water.  She went back and forth to the bathroom.  She eventually ended up in the kitchen where she fell and hit her head on the tile floor.  She was unable to get up.  Vitals:   02/08/18 0230 02/08/18 0500 02/08/18 0536 02/08/18 0700  BP: 137/64 (!) 148/70  (!) 169/77  Pulse: 69 83  76  Resp: 17     Temp:   97.8 F (36.6 C) 97.6 F (36.4 C)  TempSrc:    Axillary  SpO2: 96% 95%  98%  Weight:      Height:        CBC:  CBC Latest Ref Rng & Units 02/07/2018 02/07/2018  WBC 4.0 - 10.5 K/uL - 8.4  Hemoglobin 12.0 - 15.0 g/dL 96.0 45.4  Hematocrit 09.8 - 46.0 % 41.0 40.1  Platelets 150 - 400 K/uL - 305     Comprehensive Metabolic Panel:   CMP Latest Ref Rng & Units 02/07/2018 02/07/2018  Glucose 65 - 99 mg/dL 90 92  BUN 6 - 20 mg/dL 11(B) 18  Creatinine 1.47 - 1.00 mg/dL 8.29 5.62  Sodium 130 - 145 mmol/L 141 140  Potassium 3.5 - 5.1 mmol/L 3.8 4.1  Chloride 101 - 111 mmol/L 106 107  CO2 22 - 32 mmol/L - 25  Calcium 8.9 - 10.3 mg/dL - 9.1  Total Protein 6.5 - 8.1 g/dL - 6.8  Total Bilirubin 0.3 - 1.2 mg/dL - 0.8  Alkaline Phos 38 - 126 U/L - 92  AST 15 - 41 U/L - 34  ALT 14 - 54 U/L - 19   Lipid Panel: No results found for: CHOL, TRIG, HDL, CHOLHDL, VLDL, LDLCALC HgbA1c: No results found for: HGBA1C Urine Drug Screen:     Component Value Date/Time   LABOPIA NONE DETECTED 02/07/2018 2046   COCAINSCRNUR NONE DETECTED 02/07/2018 2046   LABBENZ NONE DETECTED 02/07/2018 2046   AMPHETMU NONE DETECTED 02/07/2018 2046   THCU NONE DETECTED 02/07/2018 2046   LABBARB NONE DETECTED 02/07/2018 2046    Alcohol Level     Component Value Date/Time   ETH <10 02/07/2018 2059    IMAGING Ct Head Wo Contrast  Result Date:  02/07/2018 CLINICAL DATA:  82 year old female with headache and dizziness. EXAM: CT HEAD WITHOUT CONTRAST TECHNIQUE: Contiguous axial images were obtained from the base of the skull through the vertex without intravenous contrast. COMPARISON:  None. FINDINGS: Brain: There is mild age-related atrophy. Moderate periventricular and deep white matter chronic microvascular ischemic changes noted. There is no acute intracranial hemorrhage. No mass effect or midline shift noted. No extra-axial fluid collection. Vascular: No hyperdense vessel or unexpected calcification. Skull: Normal. Negative for fracture or focal lesion. Sinuses/Orbits: No acute finding. Other: None IMPRESSION: 1. No acute intracranial hemorrhage. 2. Age-related atrophy and chronic microvascular ischemic changes. Electronically Signed   By: Elgie Collard M.D.   On: 02/07/2018 21:56   Mr Brain Wo Contrast  Result Date: 02/08/2018 CLINICAL DATA:  Initial evaluation for acute altered mental status, frequent falls. EXAM: MRI HEAD WITHOUT CONTRAST TECHNIQUE: Multiplanar, multiecho pulse sequences of the brain and surrounding structures were obtained without intravenous contrast. COMPARISON:  Prior CT from 02/07/2018. FINDINGS: Brain: Generalized age-related cerebral atrophy. Patchy and confluent T2/FLAIR hyperintensity  within the periventricular and deep white matter both cerebral hemispheres most consistent with chronic small vessel ischemic change, moderate in nature There is an acute ischemic nonhemorrhagic infarct involving the ventral right thalamus measuring 6 x 12 x 21 mm (series 5001, image 65). No associated mass effect. No other evidence for acute or subacute ischemia. Gray-white matter differentiation otherwise maintained. No acute or chronic intracranial hemorrhage. No mass lesion, midline shift or mass effect. Mild ventricular prominence related global parenchymal volume loss of hydrocephalus. No extra-axial fluid collection. Major dural  sinuses are grossly patent. Pituitary gland suprasellar region normal. Midline structures intact and normal. Vascular: Major intracranial vascular flow voids are maintained. Skull and upper cervical spine: Craniocervical junction within normal limits. Upper cervical spine normal. Bone marrow signal intensity within normal limits. No scalp soft tissue abnormality. Sinuses/Orbits: Globes and orbital soft tissues within normal limits. Patient status post lens extraction bilaterally. Paranasal sinuses are clear. No significant mastoid effusion. Inner ear structures normal. Other: None. IMPRESSION: 1. Approximate 2 cm acute ischemic nonhemorrhagic ventral right thalamic infarct. 2. Underlying moderate chronic small vessel ischemic disease. Electronically Signed   By: Rise Mu M.D.   On: 02/08/2018 04:15   Mr Maxine Glenn Head Wo Contrast  Result Date: 02/08/2018 CLINICAL DATA:  Confusion and gait difficulty.  Left facial droop. EXAM: MRA HEAD WITHOUT CONTRAST TECHNIQUE: Angiographic images of the Circle of Willis were obtained using MRA technique without intravenous contrast. COMPARISON:  None. FINDINGS: Tortuous distal left cervical ICA with looping. Symmetric carotid and vertebral arteries. Dominant right PICA and left AICA. Mild atheromatous type narrowing seen along the bilateral P2 segments. Moderate narrowing at the right MCA bifurcation. Hypoplastic left A1 segment. Negative for aneurysm. IMPRESSION: 1. No acute finding. 2. Atheromatous narrowings that are mild at the P2 segments and moderate at the right MCA bifurcation. Electronically Signed   By: Marnee Spring M.D.   On: 02/08/2018 07:10   Carotid Doppler   There is 1-39% bilateral ICA stenosis. Vertebral artery flow is antegrade.    2D Echocardiogram  pending   PHYSICAL EXAM General: Appears well-developed and well-nourished.  Psych: Affect appropriate to situation Eyes: No scleral injection HENT: No OP obstrucion Head: Normocephalic.   Cardiovascular: Normal rate and regular rhythm.  Respiratory: Effort normal and breath sounds normal to anterior ascultation Skin: WDI  Neurological Examination Mental Status: Alert, oriented, thought content appropriate.  Speech fluent without evidence of aphasia. Able to follow 3 step commands without difficulty. Cranial Nerves: II: Visual fields grossly normal,  III,IV, VI: ptosis not present, extra-ocular motions intact bilaterally, pupils equal, round, reactive to light and accommodation V,VII: smile symmetric, facial light touch sensation normal bilaterally VIII: hearing normal bilaterally IX,X: uvula rises symmetrically XI: bilateral shoulder shrug XII: midline tongue extension Motor: Right :  Upper extremity   5/5                                      Left:     Upper extremity   5/5             Lower extremity   5/5                                                  Lower extremity   5/5  Tone and bulk:normal tone throughout; no atrophy noted Sensory: Pinprick and light touch intact throughout, bilaterally Deep Tendon Reflexes: 2+ and symmetric throughout Plantars: Right: downgoing                                Left: downgoing Cerebellar: normal finger-to-nose, normal rapid alternating movements and normal heel-to-shin test Gait: was not assessed   ASSESSMENT/PLAN Ms. Jearlean Demauro is a 82 y.o. female with history of hypothyroidism and hyperlipidemia presenting with confusion, gait abnormality and headache.   Stroke:   Right thalamic infarct secondary to small vessel disease source  Resultant neurological deficits resolved  CT head no acute hemorrhage. Small vessel disease. Atrophy.   MRI small right thalamic infarct.  Small vessel disease.  MRA no acute finding.  Mild atherosclerosis.  Carotid Doppler  B ICA 1-39% stenosis, VAs antegrade   2D Echo  pending   LDL 107  HgbA1c 5.7  Lovenox 40 mg sq daily for VTE prophylaxis  aspirin 81 mg daily prior to  admission, now on clopidogrel 75 mg daily. Given mild stroke, will place on aspirin 81 mg and plavix 75 mg daily x 3 weeks, then plavix alone. Orders adjusted.   Therapy recommendations:  pending   Disposition:  Anticipate return home  Hypertension  Variable ranging from 110s to 190s  . Permissive hypertension (OK if < 220/120) but gradually normalize in 5-7 days . Long-term BP goal normotensive  Hyperlipidemia  Home meds:  No statin  LDL 107, goal < 70  Added lipitor 20  Continue statin at discharge  Other Stroke Risk Factors  Advanced age  Other Active Problems  Asymptomatic UTI.  Started on antibiotics  Hospital day # 0  Annie Main, MSN, APRN, ANVP-BC, AGPCNP-BC Advanced Practice Stroke Nurse [facility] See Amion for Schedule & Pager information 02/08/2018 3:43 PM   ATTENDING NOTE: I reviewed above note and agree with the assessment and plan. I have made any additions or clarifications directly to the above note. Pt was seen and examined.   82 year old female with history of HLD, HTN admitted for gait difficulty, leaning toward the left side and left facial droop.  CT no acute abnormality, MRI showed right thalamic infarct.  UDS negative.  MRI showed left siphon and right M1 stenosis.  Carotid Doppler unremarkable.  TTE pending.  LDL 107 and A1c 5.7.  Found to have UTI and started on Rocephin.   Patient stroke likely due to small vessel disease.  She has aspirin 81 PTA, recommend aspirin 81 and Plavix dual antiplatelet for 3 weeks and then Plavix alone.  Also started on Lipitor for stroke prevention. PT/OT recommend Inpatient rehab.  Neurology will sign off. Please call with questions. Pt will follow up with stroke clinic NP at [facility] in about 4 weeks. Thanks for the consult.   Marvel Plan, MD PhD Stroke Neurology 02/08/2018 5:27 PM     To contact Stroke Continuity provider, please refer to [email or web address]. After hours, contact General Neurology

## 2018-02-09 ENCOUNTER — Observation Stay (HOSPITAL_COMMUNITY): Payer: Medicare Other

## 2018-02-09 DIAGNOSIS — W19XXXA Unspecified fall, initial encounter: Secondary | ICD-10-CM | POA: Diagnosis present

## 2018-02-09 DIAGNOSIS — M810 Age-related osteoporosis without current pathological fracture: Secondary | ICD-10-CM | POA: Diagnosis present

## 2018-02-09 DIAGNOSIS — Z8672 Personal history of thrombophlebitis: Secondary | ICD-10-CM | POA: Diagnosis not present

## 2018-02-09 DIAGNOSIS — R269 Unspecified abnormalities of gait and mobility: Secondary | ICD-10-CM

## 2018-02-09 DIAGNOSIS — R296 Repeated falls: Secondary | ICD-10-CM | POA: Diagnosis present

## 2018-02-09 DIAGNOSIS — N3000 Acute cystitis without hematuria: Secondary | ICD-10-CM | POA: Diagnosis not present

## 2018-02-09 DIAGNOSIS — K219 Gastro-esophageal reflux disease without esophagitis: Secondary | ICD-10-CM | POA: Diagnosis present

## 2018-02-09 DIAGNOSIS — Z7982 Long term (current) use of aspirin: Secondary | ICD-10-CM | POA: Diagnosis not present

## 2018-02-09 DIAGNOSIS — I639 Cerebral infarction, unspecified: Principal | ICD-10-CM | POA: Diagnosis present

## 2018-02-09 DIAGNOSIS — R29810 Facial weakness: Secondary | ICD-10-CM | POA: Diagnosis present

## 2018-02-09 DIAGNOSIS — Z90710 Acquired absence of both cervix and uterus: Secondary | ICD-10-CM | POA: Diagnosis not present

## 2018-02-09 DIAGNOSIS — Z79890 Hormone replacement therapy (postmenopausal): Secondary | ICD-10-CM | POA: Diagnosis not present

## 2018-02-09 DIAGNOSIS — K900 Celiac disease: Secondary | ICD-10-CM | POA: Diagnosis present

## 2018-02-09 DIAGNOSIS — G8314 Monoplegia of lower limb affecting left nondominant side: Secondary | ICD-10-CM

## 2018-02-09 DIAGNOSIS — Z79899 Other long term (current) drug therapy: Secondary | ICD-10-CM | POA: Diagnosis not present

## 2018-02-09 DIAGNOSIS — N390 Urinary tract infection, site not specified: Secondary | ICD-10-CM

## 2018-02-09 DIAGNOSIS — E785 Hyperlipidemia, unspecified: Secondary | ICD-10-CM | POA: Diagnosis present

## 2018-02-09 DIAGNOSIS — I5030 Unspecified diastolic (congestive) heart failure: Secondary | ICD-10-CM | POA: Diagnosis not present

## 2018-02-09 DIAGNOSIS — I10 Essential (primary) hypertension: Secondary | ICD-10-CM

## 2018-02-09 DIAGNOSIS — E039 Hypothyroidism, unspecified: Secondary | ICD-10-CM | POA: Diagnosis present

## 2018-02-09 DIAGNOSIS — R4182 Altered mental status, unspecified: Secondary | ICD-10-CM | POA: Diagnosis present

## 2018-02-09 DIAGNOSIS — G9341 Metabolic encephalopathy: Secondary | ICD-10-CM | POA: Diagnosis present

## 2018-02-09 DIAGNOSIS — I69398 Other sequelae of cerebral infarction: Secondary | ICD-10-CM | POA: Diagnosis not present

## 2018-02-09 DIAGNOSIS — Y92000 Kitchen of unspecified non-institutional (private) residence as  the place of occurrence of the external cause: Secondary | ICD-10-CM | POA: Diagnosis not present

## 2018-02-09 DIAGNOSIS — B9620 Unspecified Escherichia coli [E. coli] as the cause of diseases classified elsewhere: Secondary | ICD-10-CM | POA: Diagnosis present

## 2018-02-09 DIAGNOSIS — Z1611 Resistance to penicillins: Secondary | ICD-10-CM | POA: Diagnosis present

## 2018-02-09 DIAGNOSIS — E7800 Pure hypercholesterolemia, unspecified: Secondary | ICD-10-CM | POA: Diagnosis present

## 2018-02-09 DIAGNOSIS — R29700 NIHSS score 0: Secondary | ICD-10-CM | POA: Diagnosis present

## 2018-02-09 DIAGNOSIS — R4781 Slurred speech: Secondary | ICD-10-CM | POA: Diagnosis present

## 2018-02-09 NOTE — Consult Note (Addendum)
Physical Medicine and Rehabilitation Consult   Reason for Consult: Stroke with functional decline.  Referring Physician: Dr. Zenaida Niece   HPI: Caroline [name] is a 82 y.o. female with history of hypothyroid, Raynaud's celiac sprue who was admitted on 02/07/18 with left facial droop, confusion and difficulty walking with tendency to lean to the left as well as multiple falls and struck her head.  MRI brain done revealing right thalamic stroke and MRA without acute finding. Carotid dopplers were negative for significant stenosis. 2 D echo done today.  Dr. Roda Shutters felt that stroke was due to SVD and recommended ASA/Plavix X 3 weeks followed by Plavix alone.  Speech therapy evaluations done revealing impairments in problem solving, orientation, and attention. Patient with reports of fatigue few days PTA and was started on IV rocephin for UTI.  Therapy evaluations completed today revealing left sided weakness as well as decreased balance affecting mobility. CIR recommended due to functional deficits/    Review of Systems  Constitutional: Positive for malaise/fatigue (for few days PTA). Negative for fever.  HENT: Negative for hearing loss and tinnitus.   Eyes: Negative for blurred vision and double vision.  Respiratory: Negative for cough and shortness of breath.   Cardiovascular: Negative for chest pain and palpitations.  Gastrointestinal: Negative for constipation, heartburn and nausea.  Genitourinary: Negative for dysuria and urgency.  Musculoskeletal: Positive for back pain, falls (LLE instability with fatigue) and myalgias.       Left flank pain since falls  Neurological: Positive for focal weakness. Negative for dizziness and speech change.  Psychiatric/Behavioral: The patient is not nervous/anxious and does not have insomnia.       Past Medical History:  Diagnosis Date  . Anxiety   . Celiac sprue   . GERD (gastroesophageal reflux disease)   . Hormone replacement therapy (HRT)   .  Hypercholesteremia   . Hypothyroidism   . OAB (overactive bladder)   . Osteoporosis   . Raynaud disease   . Seasonal allergies   . Senile purpura (HCC)   . Thrombophlebitis     Past Surgical History:  Procedure Laterality Date  . ABDOMINAL HYSTERECTOMY  1979  . knee surgery Left     Family History  Problem Relation Age of Onset  . Arthritis Mother   . Hypertension Mother   . Obesity Sister     Social History:  Married. Retired from [employer] and [employer]. Independent and active without AD. Manages home and yard work--uses Psychologist, educational. She reports that she has never smoked. She has never used smokeless tobacco. She reports that she does not drink alcohol. Her drug history is not on file.    Allergies  Allergen Reactions  . Gluten Meal Nausea And Vomiting    Patient has CELIAC DISEASE  . Wheat Bran Nausea And Vomiting    Patient has CELIAC DISEASE  . Adhesive [Tape] Other (See Comments)    Patient's skin is VERY THIN and tears very easily; please use either paper tape or Coban wrap  . Codeine Nausea And Vomiting  . Other Other (See Comments)    Patient has Hypoglycemia; her husband stated "when her sugar crashes, it crashes hard."    Medications Prior to Admission  Medication Sig Dispense Refill  . Ascorbic Acid (VITAMIN C) 100 MG tablet Take 100 mg by mouth every other day.     Marland Kitchen aspirin EC 81 MG tablet Take 81 mg by mouth at bedtime.    . B Complex-Biotin-FA (  VITAMIN B50 COMPLEX PO) Take 1 tablet by mouth every other day.    . Cholecalciferol (VITAMIN D-3) 1000 units CAPS Take 1,000 Units by mouth daily.    . Cyanocobalamin (B-12) 250 MCG TABS Take 250 mcg by mouth every other day.     . ibuprofen (ADVIL,MOTRIN) 200 MG tablet Take 200 mg by mouth every 6 (six) hours as needed (for back pain).    . irbesartan (AVAPRO) 150 MG tablet Take 150 mg by mouth daily.     Marland Kitchen levothyroxine (SYNTHROID, LEVOTHROID) 100 MCG tablet Take 100 mcg by mouth daily before  breakfast.    . meloxicam (MOBIC) 15 MG tablet Take 15 mg by mouth daily as needed (for back pain).       Home: Home Living Family/patient expects to be discharged to:: Private residence Living Arrangements: Spouse/significant other Available Help at Discharge: Family, Available 24 hours/day Type of Home: House Home Access: Stairs to enter [employer] of Steps: 1 Home Layout: Two level, Able to live on main level with bedroom/bathroom Bathroom Shower/Tub: Psychologist, counselling, Door [employer] Toilet: Standard Bathroom Accessibility: Yes Home Equipment: Medical laboratory scientific officer - single point, Information systems manager - built in  Lives With: Spouse  Functional History: Prior Function Level of Independence: Independent with assistive device(s) Comments: use cane or shopping cart for longer distances in community  Functional Status:  Mobility: Bed Mobility Overal bed mobility: Modified Independent Bed Mobility: Supine to Sit Supine to sit: Supervision General bed mobility comments: supervision for safety  Transfers Overall transfer level: Needs assistance Equipment used: None Transfers: Sit to/from Stand Sit to Stand: Supervision General transfer comment: min A for steadying with cane Ambulation/Gait Ambulation/Gait assistance: Mod assist, Min assist Ambulation Distance (Feet): 300 Feet Assistive device: Straight cane, Rolling walker (2 wheeled) Gait Pattern/deviations: Step-through pattern, Staggering left, Staggering right, Scissoring, Decreased dorsiflexion - left General Gait Details: modA for steadying with cane for minor scissoring and staggering L and R, switched to RW and required min A for steadying with RW for 1x LoB Gait velocity: slowed Gait velocity interpretation: <1.8 ft/sec, indicate of risk for recurrent falls Stairs: Yes Stairs assistance: Min guard Stair Management: Two rails, Alternating pattern, Forwards Number of Stairs: 5 General stair comments: min guard for safety, good  steadying with bilateral rails    ADL: ADL Overall ADL's : Needs assistance/impaired Eating/Feeding: Independent Grooming: Supervision/safety, Standing Upper Body Bathing: Set up, Supervision/ safety, Standing Lower Body Bathing: Supervison/ safety, Set up, Sit to/from stand Upper Body Dressing : Set up, Sitting Lower Body Dressing: Supervision/safety, Sit to/from stand Toilet Transfer: Supervision/safety, RW, Comfort height toilet, Ambulation Toilet Transfer Details (indicate cue type and reason): difficulty getting up from low toilet. Would benefit from 3in1 Toileting- Clothing Manipulation and Hygiene: Modified independent Functional mobility during ADLs: Supervision/safety General ADL Comments: close to baseline with basic ADL tasks; recommend S with medication management  Cognition: Cognition Overall Cognitive Status: No family/caregiver present to determine baseline cognitive functioning Arousal/Alertness: Awake/alert Orientation Level: Oriented X4 Attention: Sustained, Selective Sustained Attention: Appears intact Selective Attention: Impaired Selective Attention Impairment: Verbal complex, Functional complex Memory: Appears intact Awareness: Impaired Awareness Impairment: Intellectual impairment Problem Solving: Impaired Problem Solving Impairment: Verbal basic, Functional basic Executive Function: Reasoning Reasoning: Impaired Reasoning Impairment: Verbal basic, Functional basic Behaviors: Impulsive Safety/Judgment: Impaired Cognition Arousal/Alertness: Awake/alert Behavior During Therapy: WFL for tasks assessed/performed Overall Cognitive Status: No family/caregiver present to determine baseline cognitive functioning General Comments: Pt appears to demonstrate decreased attention and STM. Would benefit form further cognitive assessment; able to  explain medicaiton schedule adn problem solve during ADL  Blood pressure (!) 174/52, pulse 70, temperature 97.7 F (36.5  C), temperature source Oral, resp. rate 16, height  (1.626 m), weight 64.2 kg (141 lb 9.6 oz), SpO2 99 %. Physical Exam  Nursing note and vitals reviewed. Constitutional: She is oriented to person, place, and time. She appears well-developed and well-nourished. No distress.  HENT:  Head: Normocephalic and atraumatic.  Eyes: Pupils are equal, round, and reactive to light. Conjunctivae and EOM are normal.  Neck: Normal range of motion. Neck supple.  Cardiovascular: Normal rate, regular rhythm and normal heart sounds. Exam reveals no friction rub.  No murmur heard. Respiratory: Effort normal and breath sounds normal. No respiratory distress. She has no wheezes.  GI: Soft. Bowel sounds are normal. She exhibits no distension. There is no tenderness.  Neurological: She is alert and oriented to person, place, and time.  Speech clear. Able to follow basic command without difficulty. Able to answer orientation questions without difficulty.  Left sided weakness --LE>UE  Skin: Skin is warm and dry. She is not diaphoretic. No erythema.  Psychiatric: She has a normal mood and affect.  4+ strength in left deltoid bicep tricep grip, 5/5 in the right deltoid bicep tricep grip, 5/5 bilateral hip flexor knee extensor ankle dorsiflexor Sensation reported as equal bilateral upper and lower limbs as well as facial Visual fields are intact No evidence of facial droop No evidence of dysarthria  No results found for this or any previous visit (from the past 24 hour(s)). Ct Head Wo Contrast  Result Date: 02/07/2018 CLINICAL DATA:  82 year old female with headache and dizziness. EXAM: CT HEAD WITHOUT CONTRAST TECHNIQUE: Contiguous axial images were obtained from the base of the skull through the vertex without intravenous contrast. COMPARISON:  None. FINDINGS: Brain: There is mild age-related atrophy. Moderate periventricular and deep white matter chronic microvascular ischemic changes noted. There is no acute  intracranial hemorrhage. No mass effect or midline shift noted. No extra-axial fluid collection. Vascular: No hyperdense vessel or unexpected calcification. Skull: Normal. Negative for fracture or focal lesion. Sinuses/Orbits: No acute finding. Other: None IMPRESSION: 1. No acute intracranial hemorrhage. 2. Age-related atrophy and chronic microvascular ischemic changes. Electronically Signed   By: Elgie Collard M.D.   On: 02/07/2018 21:56   Mr Brain Wo Contrast  Result Date: 02/08/2018 CLINICAL DATA:  Initial evaluation for acute altered mental status, frequent falls. EXAM: MRI HEAD WITHOUT CONTRAST TECHNIQUE: Multiplanar, multiecho pulse sequences of the brain and surrounding structures were obtained without intravenous contrast. COMPARISON:  Prior CT from 02/07/2018. FINDINGS: Brain: Generalized age-related cerebral atrophy. Patchy and confluent T2/FLAIR hyperintensity within the periventricular and deep white matter both cerebral hemispheres most consistent with chronic small vessel ischemic change, moderate in nature There is an acute ischemic nonhemorrhagic infarct involving the ventral right thalamus measuring 6 x 12 x 21 mm (series 5001, image 65). No associated mass effect. No other evidence for acute or subacute ischemia. Gray-white matter differentiation otherwise maintained. No acute or chronic intracranial hemorrhage. No mass lesion, midline shift or mass effect. Mild ventricular prominence related global parenchymal volume loss of hydrocephalus. No extra-axial fluid collection. Major dural sinuses are grossly patent. Pituitary gland suprasellar region normal. Midline structures intact and normal. Vascular: Major intracranial vascular flow voids are maintained. Skull and upper cervical spine: Craniocervical junction within normal limits. Upper cervical spine normal. Bone marrow signal intensity within normal limits. No scalp soft tissue abnormality. Sinuses/Orbits: Globes and orbital soft tissues  within normal limits. Patient status post lens extraction bilaterally. Paranasal sinuses are clear. No significant mastoid effusion. Inner ear structures normal. Other: None. IMPRESSION: 1. Approximate 2 cm acute ischemic nonhemorrhagic ventral right thalamic infarct. 2. Underlying moderate chronic small vessel ischemic disease. Electronically Signed   By: Rise Mu M.D.   On: 02/08/2018 04:15   Mr Maxine Glenn Head Wo Contrast  Result Date: 02/08/2018 CLINICAL DATA:  Confusion and gait difficulty.  Left facial droop. EXAM: MRA HEAD WITHOUT CONTRAST TECHNIQUE: Angiographic images of the Circle of Willis were obtained using MRA technique without intravenous contrast. COMPARISON:  None. FINDINGS: Tortuous distal left cervical ICA with looping. Symmetric carotid and vertebral arteries. Dominant right PICA and left AICA. Mild atheromatous type narrowing seen along the bilateral P2 segments. Moderate narrowing at the right MCA bifurcation. Hypoplastic left A1 segment. Negative for aneurysm. IMPRESSION: 1. No acute finding. 2. Atheromatous narrowings that are mild at the P2 segments and moderate at the right MCA bifurcation. Electronically Signed   By: Marnee Spring M.D.   On: 02/08/2018 07:10     Assessment/Plan: Diagnosis: Right thalamic infarct secondary to small vessel disease with mild left upper extremity weakness and gait disorder 1. Does the need for close, 24 hr/day medical supervision in concert with the patient's rehab needs make it unreasonable for this patient to be served in a less intensive setting? No 2. Co-Morbidities requiring supervision/potential complications: Hypothyroidism, celiac sprue and Raynaud's disease 3. Due to disease management and patient education, does the patient require 24 hr/day rehab nursing? Yes 4. Does the patient require coordinated care of a physician, rehab nurse, PT, OT to address physical and functional deficits in the context of the above medical  diagnosis(es)? No Addressing deficits in the following areas: balance, endurance, locomotion, strength, transferring, bathing, dressing and toileting 5. Can the patient actively participate in an intensive therapy program of at least 3 hrs of therapy per day at least 5 days per week? Yes 6. The potential for patient to make measurable gains while on inpatient rehab is Limited secondary to high functional status 7. Anticipated functional outcomes upon discharge from inpatient rehab are n/a  with PT, n/a with OT, n/a with SLP. 8. Estimated rehab length of stay to reach the above functional goals is: Not applicable 9. Anticipated D/C setting: Home 10. Anticipated post D/C treatments: HH therapy 11. Overall Rehab/Functional Prognosis: excellent  RECOMMENDATIONS: This patient's condition is appropriate for continued rehabilitative care in the following setting: [facility] Therapy Patient has agreed to participate in recommended program. Yes Note that insurance prior authorization may be required for reimbursement for recommended care.  Comment:   "I have personally performed a face to face diagnostic evaluation of this patient.  Additionally, I have reviewed and concur with the physician assistant's documentation above." Erick Colace M.D. [facility] FAAPM&R (Sports Med, Neuromuscular Med) Diplomate Am Board of Electrodiagnostic Med  Jacquelynn Cree, PA-C 02/09/2018

## 2018-02-09 NOTE — Progress Notes (Signed)
Inpatient Rehabilitation  Patient was re-screened by Fae Pippin for appropriateness for an Inpatient Acute Rehab consult and note PT recommendations.  At this time we are recommending an Inpatient Rehab consult.  Text paged MD to notify; please order if you are agreeable.  Charlane Ferretti., CCC/SLP Admission Coordinator  [facility]  Cell [phone]

## 2018-02-09 NOTE — Progress Notes (Signed)
  Echocardiogram 2D Echocardiogram has been performed.  Caroline [name] 02/09/2018, 11:49 AM

## 2018-02-09 NOTE — Progress Notes (Signed)
  Speech Language Pathology Treatment: Cognitive-Linquistic  Patient Details Name: Labresha Mellor MRN: [id number] DOB: 11/10/1935 Today's Date: 02/09/2018 Time: 1350-1405 SLP Time Calculation (min) (ACUTE ONLY): 15 min  Assessment / Plan / Recommendation Clinical Impression  Skilled treatment session focused on cognition goals. Upon entrance to room,it was evident that pt had made rapid progress. She immediately recall all PT/OT recommendations including equipment, was orientated, able to alternate attention with other people entering room and was able to demonstrate anticipatory awareness with Mod I ability. ST to sign off at this time, recommend initial 24 hour supervision at discharge to prevent any more falls. No follow up services indicated.    HPI HPI: Mckaylee Dimalanta a 82 y.o.femalewith medical history significant forhypertension and hypothyroidism, now presenting to the emergency department for evaluation of confusion, gait difficulty with falls, headache, and left facial droop. Patient reportedly had an episode of confusion last week that resolved until today at roughly 10:30 AM when she began to have difficulties including gait instability, reportedly leaning to the left side and falling multiple times, drooping of the left face with drooling, and some confusion. She is also had a frontal headache. Denies chest pain or palpitations and denies fevers or chills. By the time of admission, she reports that a slight headache persist, but other symptoms have resolved. Upon arrival to the ED, patient is found to be afebrile, saturating well on room air, and with vitals otherwise stable. EKG features a normal sinus rhythm and noncontrast head CT is negative for acute intracranial abnormality. Chemistry panel and CBC are unremarkable, UDS is negative, and ethanol level is undetectable. Urinalysis is suggestive of infection and sample was sent for culture. MRI brain reveals a 2 cm acute ischemic  nonhemorrhagic ventral right thalamic infarct. Patient was given 1 g of IV Rocephin and neurology was consulted by the ED physician. Patient will be admitted for ongoing evaluation and management of acute ischemic CVA.      SLP Plan  Continue with current plan of care;Discharge SLP treatment due to (comment)       Recommendations                   Plan: Continue with current plan of care;Discharge SLP treatment due to (comment)       GO                Wylder Macomber 02/09/2018, 2:19 PM

## 2018-02-09 NOTE — Progress Notes (Signed)
Occupational Therapy Evaluation Patient Details Name: Caroline [name] MRN: [id number] DOB: 31-Dec-1935 Today's Date: 02/09/2018    History of Present Illness 82 y.o. female with medical history significant for hypertension and hypothyroidism who presents to the emergency room with confusion, gait difficulty, headache and left facial droop noticed by family. MRI reveals a right thalamic stroke.   Clinical Impression   PTA, pt modified independent with ADL and mobility @  level. Pt with significant improvement. Pt currerntly completing ADL and mobility @ S level. Scored a 21 on the DGI which is not indicative of a high fall risk. At this time, recommend pt DC home with initial 24/7 S and HHOT. Will follow acutely to facilitate safe DC home.     Follow Up Recommendations  Home health OT;Supervision/Assistance - 24 hour    Equipment Recommendations  3 in 1 bedside commode    Recommendations for Other Services       Precautions / Restrictions Precautions Precautions: Fall Restrictions Weight Bearing Restrictions: No      Mobility Bed Mobility Overal bed mobility: Modified Independent                Transfers Overall transfer level: Needs assistance Equipment used: None Transfers: Sit to/from Stand Sit to Stand: Supervision              Balance Overall balance assessment: Needs assistance Sitting-balance support: No upper extremity supported;Feet supported Sitting balance-Leahy Scale: Good     Standing balance support: Single extremity supported Standing balance-Leahy Scale: Fair                   Standardized Balance Assessment Standardized Balance Assessment : Dynamic Gait Index   Dynamic Gait Index Level Surface: Normal Change in Gait Speed: Normal Gait with Horizontal Head Turns: Mild Impairment Gait with Vertical Head Turns: Mild Impairment Gait and Pivot Turn: Mild Impairment Step Over Obstacle: Normal Step Around Obstacles: Normal Steps:  Normal Total Score: 21  Not indicative of high fall risk     ADL either performed or assessed with clinical judgement   ADL Overall ADL's : Needs assistance/impaired Eating/Feeding: Independent   Grooming: Supervision/safety;Standing   Upper Body Bathing: Set up;Supervision/ safety;Standing   Lower Body Bathing: Supervison/ safety;Set up;Sit to/from stand   Upper Body Dressing : Set up;Sitting   Lower Body Dressing: Supervision/safety;Sit to/from stand   Toilet Transfer: Supervision/safety;RW;Comfort height toilet;Ambulation Toilet Transfer Details (indicate cue type and reason): difficulty getting up from low toilet. Would benefit from 3in1 Toileting- Clothing Manipulation and Hygiene: Modified independent       Functional mobility during ADLs: Supervision/safety General ADL Comments: close to baseline with basic ADL tasks; recommend S with medication management     Vision Baseline Vision/History: Wears glasses Wears Glasses: At all times Vision Assessment?: Yes Eye Alignment: Within Functional Limits Ocular Range of Motion: Within Functional Limits Alignment/Gaze Preference: Within Defined Limits Tracking/Visual Pursuits: Able to track stimulus in all quads without difficulty Saccades: Additional eye shifts occurred during testing Convergence: Within functional limits Visual Fields: No apparent deficits Additional Comments: Decreased visual attention     Perception Perception Perception Tested?: (appeasr [facility])   Praxis Praxis Praxis tested?: Within functional limits    Pertinent Vitals/Pain Pain Assessment: No/denies pain     Hand Dominance Right   Extremity/Trunk Assessment Upper Extremity Assessment Upper Extremity Assessment: Overall WFL for tasks assessed   Lower Extremity Assessment Lower Extremity Assessment: Defer to PT evaluation RLE Deficits / Details: ROM WFL, strength grossly 4/5 RLE Sensation:  WNL RLE Coordination: WNL LLE Deficits /  Details: ROM WFL, L hip strength grossly 3+/5, knee and ankle 4/5  LLE Sensation: WNL LLE Coordination: WNL   Cervical / Trunk Assessment Cervical / Trunk Assessment: Kyphotic;Other exceptions(scoliotic)   Communication Communication Communication: No difficulties   Cognition Arousal/Alertness: Awake/alert Behavior During Therapy: WFL for tasks assessed/performed Overall Cognitive Status: No family/caregiver present to determine baseline cognitive functioning                                 General Comments: Pt appears to demonstrate decreased attention and STM. Would benefit form further cognitive assessment; able to explain medicaiton schedule adn problem solve during ADL   General Comments       Exercises     Shoulder Instructions      Home Living Family/patient expects to be discharged to:: Private residence Living Arrangements: Spouse/significant other Available Help at Discharge: Family;Available 24 hours/day Type of Home: House Home Access: Stairs to enter [employer] of Steps: 1   Home Layout: Two level;Able to live on main level with bedroom/bathroom     Bathroom Shower/Tub: Walk-in shower;Door   [employer] Toilet: Standard Bathroom Accessibility: Yes How Accessible: Accessible via walker Home Equipment: Cane - single point;Shower seat - built in      Lives With: Spouse    Prior Functioning/Environment Level of Independence: Independent with assistive device(s)        Comments: use cane or shopping cart for longer distances in community         OT Problem List: Impaired balance (sitting and/or standing);Decreased safety awareness;Decreased cognition;Decreased knowledge of use of DME or AE      OT Treatment/Interventions: Self-care/ADL training;DME and/or AE instruction;Therapeutic activities;Patient/family education;Cognitive remediation/compensation;Balance training    OT Goals(Current goals can be found in the care plan  section) Acute Rehab OT Goals Patient Stated Goal: to be independent OT Goal Formulation: With patient Time For Goal Achievement: 02/23/18 Potential to Achieve Goals: Good  OT Frequency: Min 2X/week   Barriers to D/C:            Co-evaluation              AM-PAC PT "6 Clicks" Daily Activity     Outcome Measure Help from another person eating meals?: None Help from another person taking care of personal grooming?: None Help from another person toileting, which includes using toliet, bedpan, or urinal?: A Little Help from another person bathing (including washing, rinsing, drying)?: A Little Help from another person to put on and taking off regular upper body clothing?: None Help from another person to put on and taking off regular lower body clothing?: None 6 Click Score: 22   End of Session Equipment Utilized During Treatment: Gait belt;Rolling walker Nurse Communication: Mobility status;Other (comment)(DC plan)  Activity Tolerance: Patient tolerated treatment well Patient left: in bed;with call bell/phone within reach;with bed alarm set  OT Visit Diagnosis: Unsteadiness on feet (R26.81);History of falling (Z91.81);Other symptoms and signs involving cognitive function                Time: 1019-1040 OT Time Calculation (min): 21 min Charges:  OT General Charges $OT Visit: 1 Visit OT Evaluation $OT Eval Low Complexity: 1 Low G-Codes:     Carmel Garfield, OT/L  [phone] 02/09/2018  Samaia Iwata,HILLARY 02/09/2018, 10:54 AM

## 2018-02-09 NOTE — Progress Notes (Signed)
PROGRESS NOTE    Caroline [name]  NWG:[id number] DOB: 02/01/1936 DOA: 02/07/2018 PCP: Gaspar Garbe, MD    Brief Narrative:  82 year old female who presented with confusion, gait disturbance and left facial droop.  She does have significant past medical history for hypertension, hypothyroidism.  She developed a sudden difficulty walking, blindness in left eye with ambulatory dysfunction, leaning to the left side with multiple falls, associated with left facial dropping and confusion.  On the initial physical examination blood pressure 118/61, heart rate 75, respiratory rate 16, temperature 97.7, oxygen saturation 96%.  Moist mucous membranes, lungs are clear to auscultation, heart S1-S2 present rhythmic, the abdomen was soft nontender, no lower extremity edema, positive left facial weakness.  Sodium 140, potassium 4.1, chloride 1 7, bicarb 25, glucose 92, BUN 18, creatinine 0.93, white count 8.4, hemoglobin 13.4, hematocrit 40.1, platelets 305.  Urinalysis with specific gravity 1.018, pH 5.0, RBCs 11-20, white cell count greater than 50.  Urine drug screen negative, alcohol level less than 10.  Head CT with no acute intracranial hemorrhage.  EKG with normal sinus rhythm, normal axis, normal intervals.  Brain MRI with a 2 cm acute ischemic nonhemorrhagic ventral right thalamic infarct.   Patient was admitted to the hospital with a working diagnosis of acute ischemic cerebrovascular accident.   Assessment & Plan:   Principal Problem:   Acute ischemic stroke [facility]) Active Problems:   Hypothyroidism   Essential hypertension   Acute lower UTI   Right thalamic stroke (HCC)   Hyperlipidemia   1.  Acute ischemic nonhemorrhagic ventral right thalamic infarct. Patient feeling better, not back to baseline. Will continue physical therapy and speech therapy evaluation. Dual antiplatelet therapy with asa and clopidogrel, statin therapy with atorvastatin 20 mg daily. Carotid ultrasonography negative for  significant stenosis. Pending echocardiogram. LDL 107, HDL 64, total cholesterol 578.   2.  Urinary tract infection due to E coli (presnet on admission), complicated with metabolic encephalopathy. Urine culture positive for E coli, will follow on sensitivities, for now will continue with IV ceftriaxone.   3.  Hypertension. Continue blood pressure monitoring, target 160 to 180 mmHg, will continue to hold on irvesartan for now.   4.  Hypothyroidism. Continue levothyroxine per home regimen.   DVT prophylaxis: enoxaparin   Code Status:  full Family Communication: I spoke with patient's husband at the bedside and all questions were addressed.  Disposition Plan: pending physical therapy    Consultants:   Neurology   Procedures:     Antimicrobials:       Subjective: Patient is feeling better, improved strength but not back to baseline, no further confusion or facial drop. Tolerating po well.   Objective: Vitals:   02/09/18 0002 02/09/18 0400 02/09/18 0805 02/09/18 1151  BP: (!) 122/54 133/75 (!) 146/67 (!) 174/52  Pulse: 70 72 70 70  Resp: Temp: 97.6 F (36.4 C) 97.7 F (36.5 C) 97.7 F (36.5 C)   TempSrc: Oral Oral Oral   SpO2: 96% 99% 96% 99%  Weight:      Height:        Intake/Output Summary (Last 24 hours) at 02/09/2018 1236 Last data filed at 02/09/2018 1100 Gross per 24 hour  Intake 240 ml  Output -  Net 240 ml   Filed Weights   02/07/18 2036  Weight: 64.2 kg (141 lb 9.6 oz)    Examination:   General: Not in pain or dyspnea, deconditioned Neurology: Awake and alert, no facial drop or  focal weakness.  E ENT: mild pallor, no icterus, oral mucosa moist Cardiovascular: No JVD. S1-S2 present, rhythmic, no gallops, rubs, or murmurs. No lower extremity edema. Pulmonary: vesicular breath sounds bilaterally, adequate air movement, no wheezing, rhonchi or rales. Gastrointestinal. Abdomen with no organomegaly, non tender, no rebound or guarding Skin. No  rashes Musculoskeletal: no joint deformities     Data Reviewed: I have personally reviewed following labs and imaging studies  CBC: Recent Labs  Lab 02/07/18 2058 02/07/18 2135  WBC 8.4  --   NEUTROABS 5.2  --   HGB 13.4 13.9  HCT 40.1 41.0  MCV 92.8  --   PLT 305  --    Basic Metabolic Panel: Recent Labs  Lab 02/07/18 2058 02/07/18 2135  NA 140 141  K 4.1 3.8  CL 107 106  CO2 25  --   GLUCOSE 92 90  BUN 18 22*  CREATININE 0.93 0.90  CALCIUM 9.1  --    GFR: Estimated Creatinine Clearance: 43.1 mL/min (by C-G formula based on SCr of 0.9 mg/dL). Liver Function Tests: Recent Labs  Lab 02/07/18 2058  AST 34  ALT 19  ALKPHOS 92  BILITOT 0.8  PROT 6.8  ALBUMIN 4.0   No results for input(s): LIPASE, AMYLASE in the last 168 hours. No results for input(s): AMMONIA in the last 168 hours. Coagulation Profile: Recent Labs  Lab 02/07/18 2058  INR 1.04   Cardiac Enzymes: No results for input(s): CKTOTAL, CKMB, CKMBINDEX, TROPONINI in the last 168 hours. BNP (last 3 results) No results for input(s): PROBNP in the last 8760 hours. HbA1C: Recent Labs    02/08/18 1058  HGBA1C 5.7*   CBG: No results for input(s): GLUCAP in the last 168 hours. Lipid Profile: Recent Labs    02/08/18 1058  CHOL 182  HDL 64  LDLCALC 107*  TRIG 55  CHOLHDL 2.8   Thyroid Function Tests: No results for input(s): TSH, T4TOTAL, FREET4, T3FREE, THYROIDAB in the last 72 hours. Anemia Panel: No results for input(s): VITAMINB12, FOLATE, FERRITIN, TIBC, IRON, RETICCTPCT in the last 72 hours.    Radiology Studies: I have reviewed all of the imaging during this hospital visit personally     Scheduled Meds: . aspirin EC  81 mg Oral QHS  . atorvastatin  20 mg Oral q1800  . cholecalciferol  1,000 Units Oral Daily  . clopidogrel  75 mg Oral Daily  . enoxaparin (LOVENOX) injection  40 mg Subcutaneous Q24H  . levothyroxine  100 mcg Oral QAC breakfast  . vitamin B-12  250 mcg  Oral QODAY   Continuous Infusions: . cefTRIAXone (ROCEPHIN)  IV Stopped (02/09/18 0630)     LOS: 0 days        Kariann Wecker Annett Gula, MD [facility] Pager [phone]

## 2018-02-09 NOTE — Progress Notes (Signed)
Physical Therapy Treatment Patient Details Name: Caroline [name] MRN: [id number] DOB: Jan 20, 1936 Today's Date: 02/09/2018    History of Present Illness 82 y.o. female with medical history significant for hypertension and hypothyroidism who presents to the emergency room with confusion, gait difficulty, headache and left facial droop noticed by family. MRI reveals a right thalamic stroke.    PT Comments    Patient is progressing toward PT goals and overall min guard assist for OOB mobility. Pt with noted cognitive impairments needing redirection to tasks at times and decreased attention. Pt will need 24 hour assistance upon d/c and will continue to benefit from further skilled PT services to maximize independence and safety with mobility.    Follow Up Recommendations  CIR     Equipment Recommendations  Rolling walker with 5" wheels(youth sized)    Recommendations for Other Services       Precautions / Restrictions Precautions Precautions: Fall Restrictions Weight Bearing Restrictions: No    Mobility  Bed Mobility               General bed mobility comments: pt sitting EOB upon arrival  Transfers Overall transfer level: Needs assistance Equipment used: None Transfers: Sit to/from Stand Sit to Stand: Supervision         General transfer comment: supervision for safety  Ambulation/Gait Ambulation/Gait assistance: Min guard Ambulation Distance (Feet): 300 Feet Assistive device: Rolling walker (2 wheeled);None Gait Pattern/deviations: Step-through pattern;Decreased stride length;Trunk flexed;Drifts right/left     General Gait Details: pt drifts slightly R to L without AD; overall min guard for safety with challenges to gait; cues for safe use of RW especially when turning   Stairs Stairs: Yes Stairs assistance: Min guard Stair Management: One rail Right;Alternating pattern;Forwards Number of Stairs: 2 General stair comments: cues for sequencing   Wheelchair  Mobility    Modified Rankin (Stroke Patients Only)       Balance Overall balance assessment: Needs assistance Sitting-balance support: No upper extremity supported;Feet supported Sitting balance-Leahy Scale: Good     Standing balance support: Single extremity supported Standing balance-Leahy Scale: Fair                              Cognition Arousal/Alertness: Awake/alert Behavior During Therapy: WFL for tasks assessed/performed Overall Cognitive Status: Impaired/Different from baseline                                 General Comments: needs cues for attention to task      Exercises      General Comments General comments (skin integrity, edema, etc.): husband present in room      Pertinent Vitals/Pain Pain Assessment: No/denies pain    Home Living                      Prior Function            PT Goals (current goals can now be found in the care plan section) Acute Rehab PT Goals Patient Stated Goal: to be independent Progress towards PT goals: Progressing toward goals    Frequency    Min 4X/week      PT Plan Current plan remains appropriate    Co-evaluation              AM-PAC PT "6 Clicks" Daily Activity  Outcome Measure  Difficulty turning over in bed (including adjusting  bedclothes, sheets and blankets)?: None Difficulty moving from lying on back to sitting on the side of the bed? : A Little Difficulty sitting down on and standing up from a chair with arms (e.g., wheelchair, bedside commode, etc,.)?: A Lot Help needed moving to and from a bed to chair (including a wheelchair)?: A Little Help needed walking in hospital room?: A Little Help needed climbing 3-5 steps with a railing? : A Little 6 Click Score: 18    End of Session Equipment Utilized During Treatment: Gait belt Activity Tolerance: Patient tolerated treatment well Patient left: in chair;with call bell/phone within reach;with family/visitor  present;with chair alarm set Nurse Communication: Mobility status PT Visit Diagnosis: Unsteadiness on feet (R26.81);Other abnormalities of gait and mobility (R26.89)     Time: 1610-9604 PT Time Calculation (min) (ACUTE ONLY): 23 min  Charges:  $Gait Training: 8-22 mins $Therapeutic Activity: 8-22 mins                    G Codes:       Caroline [name], PTA Pager: [phone]     Caroline [name] 02/09/2018, 4:23 PM

## 2018-02-10 LAB — CBC WITH DIFFERENTIAL/PLATELET
Basophils Absolute: 0 10*3/uL (ref 0.0–0.1)
Basophils Relative: 1 %
Eosinophils Absolute: 0.3 10*3/uL (ref 0.0–0.7)
Eosinophils Relative: 5 %
HCT: 39.6 % (ref 36.0–46.0)
Hemoglobin: 12.8 g/dL (ref 12.0–15.0)
Lymphocytes Relative: 33 %
Lymphs Abs: 2 10*3/uL (ref 0.7–4.0)
MCH: 30 pg (ref 26.0–34.0)
MCHC: 32.3 g/dL (ref 30.0–36.0)
MCV: 92.7 fL (ref 78.0–100.0)
Monocytes Absolute: 0.4 10*3/uL (ref 0.1–1.0)
Monocytes Relative: 7 %
Neutro Abs: 3.2 10*3/uL (ref 1.7–7.7)
Neutrophils Relative %: 54 %
Platelets: 283 10*3/uL (ref 150–400)
RBC: 4.27 MIL/uL (ref 3.87–5.11)
RDW: 14.7 % (ref 11.5–15.5)
WBC: 6 10*3/uL (ref 4.0–10.5)

## 2018-02-10 LAB — ECHOCARDIOGRAM COMPLETE
Height: 64 in
Weight: 2265.6 oz

## 2018-02-10 LAB — URINE CULTURE: Culture: >=100000 — AB

## 2018-02-10 LAB — BASIC METABOLIC PANEL
Anion gap: 8 (ref 5–15)
BUN: 18 mg/dL (ref 6–20)
CO2: 27 mmol/L (ref 22–32)
Calcium: 8.7 mg/dL — ABNORMAL LOW (ref 8.9–10.3)
Chloride: 105 mmol/L (ref 101–111)
Creatinine, Ser: 0.79 mg/dL (ref 0.44–1.00)
GFR calc Af Amer: >60 mL/min (ref >60)
GFR calc non Af Amer: >60 mL/min (ref >60)
Glucose, Bld: 88 mg/dL (ref 65–99)
Potassium: 4.1 mmol/L (ref 3.5–5.1)
Sodium: 140 mmol/L (ref 135–145)

## 2018-02-10 MED ORDER — ATORVASTATIN CALCIUM 20 MG PO TABS: 20.0000 mg | ORAL_TABLET | Freq: Every day | ORAL | 0 refills | Status: DC

## 2018-02-10 MED ORDER — CEPHALEXIN 500 MG PO CAPS
500.0000 mg | ORAL_CAPSULE | Freq: Three times a day (TID) | ORAL | Status: DC
  Administered 2018-02-10: 500 mg via ORAL
  Filled 2018-02-10: qty 1

## 2018-02-10 MED ORDER — CEPHALEXIN 500 MG PO CAPS: 500.0000 mg | ORAL_CAPSULE | Freq: Three times a day (TID) | ORAL | 0 refills | Status: AC

## 2018-02-10 MED ORDER — CLOPIDOGREL BISULFATE 75 MG PO TABS: 75.0000 mg | ORAL_TABLET | Freq: Every day | ORAL | 0 refills | Status: DC

## 2018-02-10 NOTE — Progress Notes (Signed)
Occupational Therapy Treatment Patient Details Name: Caroline [name] MRN: [id number] DOB: 1936/02/13 Today's Date: 02/10/2018    History of present illness 82 y.o. female with medical history significant for hypertension and hypothyroidism who presents to the emergency room with confusion, gait difficulty, headache and left facial droop noticed by family. MRI reveals a right thalamic stroke.   OT comments  Pt continues to make steady progress. Completed ADL session with S @ RW level. Educated on use of DEM to reduce risk of falls. Pt verbalized understanding. Pt needs to continue with HHOT. Pt safe to DC home when medically stable.   Follow Up Recommendations  Home health OT;Supervision/Assistance - 24 hour    Equipment Recommendations  3 in 1 bedside commode(youth RW)    Recommendations for Other Services      Precautions / Restrictions Precautions Precautions: Fall       Mobility Bed Mobility Overal bed mobility: Modified Independent                Transfers                      Balance Overall balance assessment: Needs assistance   Sitting balance-Leahy Scale: Good       Standing balance-Leahy Scale: Fair                             ADL either performed or assessed with clinical judgement   ADL Overall ADL's : Needs assistance/impaired                                     Functional mobility during ADLs: Supervision/safety General ADL Comments: completed ADL session, including changing undergarments to do urinary incontinence @ S level. Educated on home safety and strategies to reduce risk of falls. Pt needs to use 3in1 bedside at night and over toilet during the day. Will need to use as shower seat. Pt verbalized understanding.      Vision       Perception     Praxis      Cognition Arousal/Alertness: Awake/alert Behavior During Therapy: WFL for tasks assessed/performed Overall Cognitive Status: Impaired/Different  from baseline                                 General Comments: slow processing; ? memory deficits; need to further assess        Exercises     Shoulder Instructions       General Comments      Pertinent Vitals/ Pain       Pain Assessment: No/denies pain  Home Living                                          Prior Functioning/Environment              Frequency  Min 2X/week        Progress Toward Goals  OT Goals(current goals can now be found in the care plan section)  Progress towards OT goals: Progressing toward goals  Acute Rehab OT Goals Patient Stated Goal: to be independent OT Goal Formulation: With patient Time For Goal Achievement: 02/23/18 Potential to Achieve Goals: Good ADL Goals Pt  Will Transfer to Toilet: with modified independence;bedside commode;ambulating Additional ADL Goal #1: pt/family will independently verbalize 3 strategies to reduce risk of falls Additional ADL Goal #2: Pt will complete dynamic ADL task wtih modified independence  Plan Discharge plan remains appropriate    Co-evaluation                 AM-PAC PT "6 Clicks" Daily Activity     Outcome Measure   Help from another person eating meals?: None Help from another person taking care of personal grooming?: None Help from another person toileting, which includes using toliet, bedpan, or urinal?: A Little Help from another person bathing (including washing, rinsing, drying)?: A Little Help from another person to put on and taking off regular upper body clothing?: None Help from another person to put on and taking off regular lower body clothing?: None 6 Click Score: 22    End of Session Equipment Utilized During Treatment: Rolling walker  OT Visit Diagnosis: Unsteadiness on feet (R26.81);History of falling (Z91.81);Other symptoms and signs involving cognitive function   Activity Tolerance Patient tolerated treatment well   Patient  Left in chair;with call bell/phone within reach;with chair alarm set   Nurse Communication Other (comment)(DC needs)        Time: 1220-1250 OT Time Calculation (min): 30 min  Charges: OT General Charges $OT Visit: 1 Visit OT Treatments $Self Care/Home Management : 23-37 mins  [facility], OT/L  [phone] 02/10/2018   Caroline [name],HILLARY 02/10/2018, 1:06 PM

## 2018-02-10 NOTE — Progress Notes (Signed)
Physical Therapy Treatment Patient Details Name: Caroline [name] MRN: [id number] DOB: 10-07-1935 Today's Date: 02/10/2018    History of Present Illness 82 y.o. female with medical history significant for hypertension and hypothyroidism who presents to the emergency room with confusion, gait difficulty, headache and left facial droop noticed by family. MRI reveals a right thalamic stroke.    PT Comments    Session focused on stair training and ambulating. Able to walk with min guard without AD, however still unsteady. Will benefit from HHPT with follow up OP neuro PT. Stairs today with close supervision. Pt leaving unit as session was cut short. Discussed home safety considerations with patient, she reports she is happy to be d/cing today.   Follow Up Recommendations  Home health PT     Equipment Recommendations  Rolling walker with 5" wheels    Recommendations for Other Services       Precautions / Restrictions Precautions Precautions: Fall    Mobility  Bed Mobility Overal bed mobility: Modified Independent Bed Mobility: Supine to Sit     Supine to sit: Supervision     General bed mobility comments: pt sitting EOB upon arrival  Transfers Overall transfer level: Needs assistance Equipment used: None Transfers: Sit to/from Stand Sit to Stand: Supervision         General transfer comment: supervision for safety  Ambulation/Gait Ambulation/Gait assistance: Min guard Ambulation Distance (Feet): 150 Feet Assistive device: Rolling walker (2 wheeled);None Gait Pattern/deviations: Step-through pattern;Decreased stride length;Trunk flexed;Drifts right/left Gait velocity: slowed   General Gait Details: patient unsteable without AD, walking with supervision with RW this visit.   Stairs Stairs: Yes Stairs assistance: Min guard;Supervision Stair Management: One rail Right;Alternating pattern;Forwards Number of Stairs: 8 General stair comments: cues for  sequencing   Wheelchair Mobility    Modified Rankin (Stroke Patients Only)       Balance Overall balance assessment: Needs assistance Sitting-balance support: No upper extremity supported;Feet supported Sitting balance-Leahy Scale: Good       Standing balance-Leahy Scale: Fair                              Cognition Arousal/Alertness: Awake/alert Behavior During Therapy: WFL for tasks assessed/performed Overall Cognitive Status: Impaired/Different from baseline                                 General Comments: slow processing; ? memory deficits; need to further assess      Exercises      General Comments        Pertinent Vitals/Pain Pain Assessment: No/denies pain    Home Living                      Prior Function            PT Goals (current goals can now be found in the care plan section) Acute Rehab PT Goals Patient Stated Goal: to be independent PT Goal Formulation: With patient Time For Goal Achievement: 02/22/18 Potential to Achieve Goals: Good Progress towards PT goals: Progressing toward goals    Frequency    Min 4X/week      PT Plan Current plan remains appropriate    Co-evaluation              AM-PAC PT "6 Clicks" Daily Activity  Outcome Measure  Difficulty turning over in bed (including adjusting bedclothes, sheets  and blankets)?: None Difficulty moving from lying on back to sitting on the side of the bed? : A Little Difficulty sitting down on and standing up from a chair with arms (e.g., wheelchair, bedside commode, etc,.)?: A Lot Help needed moving to and from a bed to chair (including a wheelchair)?: A Little Help needed walking in hospital room?: A Little Help needed climbing 3-5 steps with a railing? : A Little 6 Click Score: 18    End of Session Equipment Utilized During Treatment: Gait belt Activity Tolerance: Patient tolerated treatment well Patient left: in chair;with call  bell/phone within reach;with family/visitor present;with chair alarm set Nurse Communication: Mobility status PT Visit Diagnosis: Unsteadiness on feet (R26.81);Other abnormalities of gait and mobility (R26.89)     Time: 1610-9604 PT Time Calculation (min) (ACUTE ONLY): 18 min  Charges:  $Gait Training: 8-22 mins                    G Codes:       Etta Grandchild, PT, DPT Acute Rehab Services Pager: [phone]     Etta Grandchild 02/10/2018, 4:15 PM

## 2018-02-10 NOTE — Discharge Summary (Signed)
Physician Discharge Summary  Morrisa Aldaba ZOX:[id number] DOB: August 28, 1936 DOA: 02/07/2018  PCP: Gaspar Garbe, MD  Admit date: 02/07/2018 Discharge date: 02/10/2018  Admitted From: Home Disposition:  Home  Recommendations for Outpatient Follow-up and new medication changes:  1. Follow up with PCP in 1- week 2. Patient has been placed on clopidogrel and aspirin for 3 weeks, then continue aspirin alone.  3. Follow up with neurology as outpatient.  4. Pending echocardiogram report.   Home Health: Yes  Equipment/Devices: walker   Discharge Condition: stable  CODE STATUS: Full Diet recommendation: Heart healthy diet  Brief/Interim Summary: 82 year old female who presented with confusion, gait disturbance and left facial droop. She does have the significant past medical history for hypertension, and hypothyroidism.  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  On the initial physical examination blood pressure 118/61, heart rate 75, respiratory rate 16, temperature 97.7, oxygen saturation 96%.  Moist mucous membranes, lungs were clear to auscultation, heart S1-S2 present rhythmic, the abdomen was soft and nontender, no lower extremity edema, positive left facial weakness.  Sodium 140, potassium 4.1, chloride 1 7, bicarb 25, glucose 92, BUN 18, creatinine 0.93, white count 8.4, hemoglobin 13.4, hematocrit 40.1, platelets 305.  Urinalysis with specific gravity 1.018, pH 5.0, RBCs 11-20, white cell count greater than 50.  Urine drug screen negative, alcohol level less than 10.  Head CT with no acute intracranial hemorrhage.  EKG with normal sinus rhythm, normal axis, normal intervals.  Brain MRI with a 2 cm acute ischemic nonhemorrhagic ventral right thalamic infarct.   Patient was admitted to the hospital with a working diagnosis of acute ischemic cerebrovascular accident.    1.  Acute ischemic nonhemorrhagic ventral right thalamic infarct.  Patient was admitted to the medical ward, she was placed on a remote telemetry monitor, received physical therapy and speech therapy evaluation.  Frequent neuro checks.  Carotid ultrasonography with no significant stenosis, echocardiogram report is still pending.  Her condition improved, it was recommended to continue physical therapy at home.  Home health services have been arranged.  Patient will continue dual therapy for 3 weeks, with aspirin and clopidogrel, then continue clopidogrel alone.  Patient was placed on low-dose atorvastatin 20 mg daily, her LDL is 107, HDL 64 with cholesterol 182.   2.  Urinary tract infection due to E. coli, present on admission, complicated by metabolic encephalopathy.  Patient was placed on IV ceftriaxone, her encephalopathy improved, at discharge she is back to her baseline.  She will continue cephalexin for 3 more days.  Coli sensitive to cephalosporins, resistant to ampicillin.  3.  Hypertension.  Her antihypertensive agents were held during her hospitalization to prevent hypotension, at discharge she will resume irbesartan.  Discharge systolic blood pressure 134 mmHg.   4.  Hypothyroidism.  Continue levothyroxine.  Discharge Diagnoses:  Principal Problem:   Acute ischemic stroke [facility]) Active Problems:   Hypothyroidism   Essential hypertension   Acute lower UTI   Right thalamic stroke (HCC)   Hyperlipidemia   CVA (cerebral vascular accident) [facility])    Discharge Instructions  Discharge Instructions    Ambulatory referral to Neurology   Complete by:  As directed    Follow up with stroke clinic NP (Jessica Vanschaick or Darrol Angel, if both not available, consider Dr. Delia Heady, Dr. Jamelle Rushing, or Dr. Naomie Dean) at [facility] in about 4 weeks.  Allergies as of 02/10/2018      Reactions   Gluten Meal Nausea And Vomiting   Patient has CELIAC  DISEASE   Wheat Bran Nausea And Vomiting   Patient has CELIAC DISEASE   Adhesive [tape] Other (See Comments)   Patient's skin is VERY THIN and tears very easily; please use either paper tape or Coban wrap   Codeine Nausea And Vomiting   Other Other (See Comments)   Patient has Hypoglycemia; her husband stated "when her sugar crashes, it crashes hard."      Medication List    TAKE these medications   aspirin EC 81 MG tablet Take 81 mg by mouth at bedtime.   atorvastatin 20 MG tablet Commonly known as:  LIPITOR Take 1 tablet (20 mg total) by mouth daily at 6 PM.   B-12 250 MCG Tabs Take 250 mcg by mouth every other day.   cephALEXin 500 MG capsule Commonly known as:  KEFLEX Take 1 capsule (500 mg total) by mouth every 8 (eight) hours for 3 days.   clopidogrel 75 MG tablet Commonly known as:  PLAVIX Take 1 tablet (75 mg total) by mouth daily.   ibuprofen 200 MG tablet Commonly known as:  ADVIL,MOTRIN Take 200 mg by mouth every 6 (six) hours as needed (for back pain).   irbesartan 150 MG tablet Commonly known as:  AVAPRO Take 150 mg by mouth daily.   levothyroxine 100 MCG tablet Commonly known as:  SYNTHROID, LEVOTHROID Take 100 mcg by mouth daily before breakfast.   meloxicam 15 MG tablet Commonly known as:  MOBIC Take 15 mg by mouth daily as needed (for back pain).   VITAMIN B50 COMPLEX PO Take 1 tablet by mouth every other day.   vitamin C 100 MG tablet Take 100 mg by mouth every other day.   Vitamin D-3 1000 units Caps Take 1,000 Units by mouth daily.      Follow-up Information    [facility] Follow up in 4 week(s).   Specialty:  Neurology Why:  Stroke clinic.  Office will call with appointment date and time. Contact information: [address] [place] [address] [phone]         Allergies  Allergen Reactions  . Gluten Meal Nausea And Vomiting    Patient has CELIAC DISEASE  . Wheat Bran Nausea  And Vomiting    Patient has CELIAC DISEASE  . Adhesive [Tape] Other (See Comments)    Patient's skin is VERY THIN and tears very easily; please use either paper tape or Coban wrap  . Codeine Nausea And Vomiting  . Other Other (See Comments)    Patient has Hypoglycemia; her husband stated "when her sugar crashes, it crashes hard."    Consultations:  Neurology    Procedures/Studies: Ct Head Wo Contrast  Result Date: 02/07/2018 CLINICAL DATA:  82 year old female with headache and dizziness. EXAM: CT HEAD WITHOUT CONTRAST TECHNIQUE: Contiguous axial images were obtained from the base of the skull through the vertex without intravenous contrast. COMPARISON:  None. FINDINGS: Brain: There is mild age-related atrophy. Moderate periventricular and deep white matter chronic microvascular ischemic changes noted. There is no acute intracranial hemorrhage. No mass effect or midline shift noted. No extra-axial fluid collection. Vascular: No hyperdense vessel or unexpected calcification. Skull: Normal. Negative for fracture or focal lesion. Sinuses/Orbits: No acute finding. Other: None IMPRESSION: 1. No acute intracranial hemorrhage. 2. Age-related atrophy and chronic microvascular ischemic changes. Electronically Signed   By: Burtis Junes  Radparvar M.D.   On: 02/07/2018 21:56   Mr Brain Wo Contrast  Result Date: 02/08/2018 CLINICAL DATA:  Initial evaluation for acute altered mental status, frequent falls. EXAM: MRI HEAD WITHOUT CONTRAST TECHNIQUE: Multiplanar, multiecho pulse sequences of the brain and surrounding structures were obtained without intravenous contrast. COMPARISON:  Prior CT from 02/07/2018. FINDINGS: Brain: Generalized age-related cerebral atrophy. Patchy and confluent T2/FLAIR hyperintensity within the periventricular and deep white matter both cerebral hemispheres most consistent with chronic small vessel ischemic change, moderate in nature There is an acute ischemic nonhemorrhagic infarct involving  the ventral right thalamus measuring 6 x 12 x 21 mm (series 5001, image 65). No associated mass effect. No other evidence for acute or subacute ischemia. Gray-white matter differentiation otherwise maintained. No acute or chronic intracranial hemorrhage. No mass lesion, midline shift or mass effect. Mild ventricular prominence related global parenchymal volume loss of hydrocephalus. No extra-axial fluid collection. Major dural sinuses are grossly patent. Pituitary gland suprasellar region normal. Midline structures intact and normal. Vascular: Major intracranial vascular flow voids are maintained. Skull and upper cervical spine: Craniocervical junction within normal limits. Upper cervical spine normal. Bone marrow signal intensity within normal limits. No scalp soft tissue abnormality. Sinuses/Orbits: Globes and orbital soft tissues within normal limits. Patient status post lens extraction bilaterally. Paranasal sinuses are clear. No significant mastoid effusion. Inner ear structures normal. Other: None. IMPRESSION: 1. Approximate 2 cm acute ischemic nonhemorrhagic ventral right thalamic infarct. 2. Underlying moderate chronic small vessel ischemic disease. Electronically Signed   By: Rise Mu M.D.   On: 02/08/2018 04:15   Mr Maxine Glenn Head Wo Contrast  Result Date: 02/08/2018 CLINICAL DATA:  Confusion and gait difficulty.  Left facial droop. EXAM: MRA HEAD WITHOUT CONTRAST TECHNIQUE: Angiographic images of the Circle of Willis were obtained using MRA technique without intravenous contrast. COMPARISON:  None. FINDINGS: Tortuous distal left cervical ICA with looping. Symmetric carotid and vertebral arteries. Dominant right PICA and left AICA. Mild atheromatous type narrowing seen along the bilateral P2 segments. Moderate narrowing at the right MCA bifurcation. Hypoplastic left A1 segment. Negative for aneurysm. IMPRESSION: 1. No acute finding. 2. Atheromatous narrowings that are mild at the P2 segments and  moderate at the right MCA bifurcation. Electronically Signed   By: Marnee Spring M.D.   On: 02/08/2018 07:10       Subjective: Patient is feeling better, no nausea or vomiting, no chest pain or dyspnea.   Discharge Exam: Vitals:   02/10/18 0422 02/10/18 0849  BP: (!) 141/62 (!) 134/57  Pulse: 63 70  Resp: 18 18  Temp: 97.6 F (36.4 C) (!) 97.5 F (36.4 C)  SpO2: 98% 97%   Vitals:   02/09/18 2000 02/09/18 2356 02/10/18 0422 02/10/18 0849  BP: (!) 179/70 (!) 140/55 (!) 141/62 (!) 134/57  Pulse: 72 71 63 70  Resp: 18 18 18 18   Temp: 97.8 F (36.6 C) 98.1 F (36.7 C) 97.6 F (36.4 C) (!) 97.5 F (36.4 C)  TempSrc: Oral Oral Oral Oral  SpO2: 96% 96% 98% 97%  Weight:      Height:        General: Not in pain or dyspnea Neurology: Awake and alert, non focal  E ENT: mild pallor, no icterus, oral mucosa moist Cardiovascular: No JVD. S1-S2 present, rhythmic, no gallops, rubs, or murmurs. No lower extremity edema. Pulmonary: vesicular breath sounds bilaterally, adequate air movement, no wheezing, rhonchi or rales. Gastrointestinal. Abdomen flat, no organomegaly, non tender, no rebound or guarding Skin. No  rashes Musculoskeletal: no joint deformities   The results of significant diagnostics from this hospitalization (including imaging, microbiology, ancillary and laboratory) are listed below for reference.     Microbiology: Recent Results (from the past 240 hour(s))  Urine culture     Status: Abnormal   Collection Time: 02/07/18  8:46 PM  Result Value Ref Range Status   Specimen Description URINE, RANDOM  Final   Special Requests   Final    NONE Performed at [facility], [address], [place], [place] [address]    Culture >=100,000 COLONIES/mL ESCHERICHIA COLI (A)  Final   Report Status 02/10/2018 FINAL  Final   Organism ID, Bacteria ESCHERICHIA COLI (A)  Final      Susceptibility   Escherichia coli - MIC*    AMPICILLIN >=32 RESISTANT Resistant      CEFAZOLIN <=4 SENSITIVE Sensitive     CEFTRIAXONE <=1 SENSITIVE Sensitive     CIPROFLOXACIN <=0.25 SENSITIVE Sensitive     GENTAMICIN <=1 SENSITIVE Sensitive     IMIPENEM <=0.25 SENSITIVE Sensitive     NITROFURANTOIN <=16 SENSITIVE Sensitive     TRIMETH/SULFA <=20 SENSITIVE Sensitive     AMPICILLIN/SULBACTAM >=32 RESISTANT Resistant     PIP/TAZO <=4 SENSITIVE Sensitive     Extended ESBL NEGATIVE Sensitive     * >=100,000 COLONIES/mL ESCHERICHIA COLI     Labs: BNP (last 3 results) No results for input(s): BNP in the last 8760 hours. Basic Metabolic Panel: Recent Labs  Lab 02/07/18 2058 02/07/18 2135 02/10/18 0438  NA 140 141 140  K 4.1 3.8 4.1  CL 107 106 105  CO2 25  --  27  GLUCOSE 92 90 88  BUN 18 22* 18  CREATININE 0.93 0.90 0.79  CALCIUM 9.1  --  8.7*   Liver Function Tests: Recent Labs  Lab 02/07/18 2058  AST 34  ALT 19  ALKPHOS 92  BILITOT 0.8  PROT 6.8  ALBUMIN 4.0   No results for input(s): LIPASE, AMYLASE in the last 168 hours. No results for input(s): AMMONIA in the last 168 hours. CBC: Recent Labs  Lab 02/07/18 2058 02/07/18 2135 02/10/18 0438  WBC 8.4  --  6.0  NEUTROABS 5.2  --  3.2  HGB 13.4 13.9 12.8  HCT 40.1 41.0 39.6  MCV 92.8  --  92.7  PLT 305  --  283   Cardiac Enzymes: No results for input(s): CKTOTAL, CKMB, CKMBINDEX, TROPONINI in the last 168 hours. BNP: Invalid input(s): POCBNP CBG: No results for input(s): GLUCAP in the last 168 hours. D-Dimer No results for input(s): DDIMER in the last 72 hours. Hgb A1c Recent Labs    02/08/18 1058  HGBA1C 5.7*   Lipid Profile Recent Labs    02/08/18 1058  CHOL 182  HDL 64  LDLCALC 107*  TRIG 55  CHOLHDL 2.8   Thyroid function studies No results for input(s): TSH, T4TOTAL, T3FREE, THYROIDAB in the last 72 hours.  Invalid input(s): FREET3 Anemia work up No results for input(s): VITAMINB12, FOLATE, FERRITIN, TIBC, IRON, RETICCTPCT in the last 72 hours. Urinalysis     Component Value Date/Time   COLORURINE YELLOW 02/07/2018 2046   APPEARANCEUR CLOUDY (A) 02/07/2018 2046   LABSPEC 1.018 02/07/2018 2046   PHURINE 5.0 02/07/2018 2046   GLUCOSEU NEGATIVE 02/07/2018 2046   HGBUR SMALL (A) 02/07/2018 2046   BILIRUBINUR NEGATIVE 02/07/2018 2046   KETONESUR NEGATIVE 02/07/2018 2046   PROTEINUR NEGATIVE 02/07/2018 2046   NITRITE POSITIVE (A) 02/07/2018 2046  LEUKOCYTESUR LARGE (A) 02/07/2018 2046   Sepsis Labs Invalid input(s): PROCALCITONIN,  WBC,  LACTICIDVEN Microbiology Recent Results (from the past 240 hour(s))  Urine culture     Status: Abnormal   Collection Time: 02/07/18  8:46 PM  Result Value Ref Range Status   Specimen Description URINE, RANDOM  Final   Special Requests   Final    NONE Performed at [facility], [address], [place], [place] [address]    Culture >=100,000 COLONIES/mL ESCHERICHIA COLI (A)  Final   Report Status 02/10/2018 FINAL  Final   Organism ID, Bacteria ESCHERICHIA COLI (A)  Final      Susceptibility   Escherichia coli - MIC*    AMPICILLIN >=32 RESISTANT Resistant     CEFAZOLIN <=4 SENSITIVE Sensitive     CEFTRIAXONE <=1 SENSITIVE Sensitive     CIPROFLOXACIN <=0.25 SENSITIVE Sensitive     GENTAMICIN <=1 SENSITIVE Sensitive     IMIPENEM <=0.25 SENSITIVE Sensitive     NITROFURANTOIN <=16 SENSITIVE Sensitive     TRIMETH/SULFA <=20 SENSITIVE Sensitive     AMPICILLIN/SULBACTAM >=32 RESISTANT Resistant     PIP/TAZO <=4 SENSITIVE Sensitive     Extended ESBL NEGATIVE Sensitive     * >=100,000 COLONIES/mL ESCHERICHIA COLI     Time coordinating discharge: 45 minutes  SIGNED:   Coralie Keens, MD  [facility] 02/10/2018, 10:44 AM Pager [phone]  If 7PM-7AM, please contact night-coverage [email or web address] Password [email or web address]

## 2018-02-10 NOTE — Progress Notes (Addendum)
NCM spoke with patient, discussed recommendation of DME: rolling walker with seat, 3-in-1.  Patient agreed to 3-in-1 and rolling walker with 2 wheels, offered choice, patient selected [facility] for DME.  Also discussed recommendation for home health therapy PT/OT and patient agreed. Offered choice, she selected [facility].  Referral for Home Health physical therapy/occupational therapy and DME: 3-in1 and rolling walker (2 wheels) called to [place], hospital liaison with [facility].  At discharge patient has transportation home.  Caroline Reams, RN Nurse case Production designer, theatre/television/film

## 2018-02-15 ENCOUNTER — Other Ambulatory Visit: Payer: Self-pay

## 2018-02-15 NOTE — Patient Outreach (Signed)
[facility]) Care Management  02/15/2018  Caroline [name] 07/31/1936 [id number]  EMMI: stroke red alert Referral date: 02/15/18 Referral reason: scheduled follow up appointment: NO Insurance: [employer] Day # 1  Telephone call to patient  Regarding EMMI stroke red alert. HIPAA verified with patient. Discussed reason for call. Patient states she has scheduled her follow up appointment with her primary MD for Monday 02/20/18.  Patient states she has a scheduled appointment with the neurologist for 03/20/18.  Patient reports having all of her medications and taking as prescribed. She reports she has completed her antibiotics.  Patient states her husband provides her transportation to her appointments. Patient denies any new symptom. RNCM reviewed signs/ symptoms of stroke with patient. Advised to call 911 for stroke symptoms. Patient verbalized understanding.  Patient reports she is receiving home health with [facility].  Denies any further needs or concerns. Patient verbally agreed to ongoing EMMI automated calls. Aware that if there is concern based on the way question is answered on call she will receive call from nurse.    PLAN: RNCM will close patient due to patient being assessed and having no further needs.   George Ina RN,BSN,CCM [facility] Telephonic  [phone]   .

## 2018-03-09 ENCOUNTER — Ambulatory Visit: Payer: Medicare Other | Admitting: Adult Health

## 2018-03-09 ENCOUNTER — Encounter: Payer: Self-pay | Admitting: Adult Health

## 2018-03-09 VITALS — BP 140/77 | HR 89 | Ht 62.0 in | Wt 140.0 lb

## 2018-03-09 DIAGNOSIS — I639 Cerebral infarction, unspecified: Secondary | ICD-10-CM | POA: Diagnosis not present

## 2018-03-09 DIAGNOSIS — I6381 Other cerebral infarction due to occlusion or stenosis of small artery: Secondary | ICD-10-CM

## 2018-03-09 DIAGNOSIS — E785 Hyperlipidemia, unspecified: Secondary | ICD-10-CM

## 2018-03-09 DIAGNOSIS — I10 Essential (primary) hypertension: Secondary | ICD-10-CM

## 2018-03-09 MED ORDER — CLOPIDOGREL BISULFATE 75 MG PO TABS: 75.0000 mg | ORAL_TABLET | Freq: Every day | ORAL | 3 refills | Status: AC

## 2018-03-09 NOTE — Patient Instructions (Addendum)
Continue clopidogrel 75 mg daily  and lipitor  for secondary stroke prevention  Stop aspirin 81mg  and continue plavix only  Do not take advil for pain - you can take Tylenol 1-2 tabs every 8 hours as needed  Continue to follow up with PCP regarding cholesterol and blood pressure management   Continue to monitor blood pressure at home  Continue to stay active and eat healthy  Keep your mind active by doing memory games such as suduku, word search, cross word puzzles and card games  We will have our research department call you with any possible trials related to memory loss  Cleared to start driving with husband in passenger seat for the first few times. You can then start to drive alone if needed but drive to only places that you are aware of and avoid main roads/high ways  Maintain strict control of hypertension with blood pressure goal below 130/90, diabetes with hemoglobin A1c goal below 6.5% and cholesterol with LDL cholesterol (bad cholesterol) goal below 70 mg/dL. I also advised the patient to eat a healthy diet with plenty of whole grains, cereals, fruits and vegetables, exercise regularly and maintain ideal body weight.  Followup in the future with me in 4 months or call earlier if needed       Thank you [facility]. I hope we have been able to provide you high quality care today.  You may receive a patient satisfaction survey over the next few weeks. We would appreciate your feedback and comments so that we may continue to improve ourselves and the health of our patients.

## 2018-03-09 NOTE — Progress Notes (Signed)
[facility] [address] [place]. [place] [address] [phone]       OFFICE FOLLOW UP NOTE  [name] Date of Birth:  07/22/1936 Medical Record Number:  [id number]   Reason for Referral:  hospital stroke follow up  CHIEF COMPLAINT:  Chief Complaint  Patient presents with  . Follow-up    Stroke follow up, pt seen at hospital room 9 [name] pts husband    HPI: Caroline [name] is being seen today for initial visit in the office for right thalamic infarct on 02/07/18. History obtained from patient and chart review. Reviewed all radiology images and labs personally.  [name] is a 82 y.o. female with history of hypothyroidism and hyperlipidemia presenting with confusion, gait abnormality and headache. CT head reviewed and showed no acute hemorrhage or no abnormality.  MRI head reviewed and shows small right thalamic infarct along with small vessel disease.  MRA showed no acute finding except for mild arthrosclerosis.  Carotid Doppler showed bilateral ICA stenosis of 1 to 39%.  2D echo showed an EF of 60 to 65%.  LDL 107 this patient was not prescribed statin PTA it was recommended to start Lipitor 20 mg.  Patient was taking aspirin 81 mg PTA and recommended DAPT with aspirin and Plavix for 3 weeks and then continue Plavix alone.  PT/OT recommended inpatient rehab.  Patient is being seen today for hospital follow-up and is accompanied by her husband.  She states all symptoms have resolved but husband does have concerns of continued memory loss.  She continues to take both aspirin and Plavix without side effects of bleeding or bruising as they were confused regarding what medication to stop.  Continues to take Lipitor without side effects myalgias.  Blood pressure today 140/77 and they do check this at home which typically runs between 1 30-1 40.  She has continued doing all previous activities and lives alone independently with her husband.  Patient is interested in memory  trial where they did inquire one at [facility] but never received a call back.  Denies new or worsening stroke/TIA symptoms.   ROS:   14 system review of systems performed and negative with exception of eye itching, heat intolerance, food allergies, incontinence of bladder, frequency of urination, daytime sleepiness, back pain, and itching  PMH:  Past Medical History:  Diagnosis Date  . Anxiety   . Arthritis   . Celiac sprue   . DVT (deep vein thrombosis) in pregnancy (HCC)   . GERD (gastroesophageal reflux disease)   . Hormone replacement therapy (HRT)   . Hypercholesteremia   . Hyperlipidemia   . Hypertension   . Hypoglycemia   . Hypothyroidism   . OAB (overactive bladder)   . Osteoporosis   . Scoliosis   . Seasonal allergies   . Senile purpura (HCC)   . Thrombophlebitis     PSH:  Past Surgical History:  Procedure Laterality Date  . ABDOMINAL HYSTERECTOMY  1979  . knee surgery Left     Social History:  Social History   Socioeconomic History  . Marital status: Married    Spouse name: Not on file  . Number of children: 3  . Years of education: 49  . Highest education level: Not on file  Occupational History  . Occupation: retired  Engineer, production  . Financial resource strain: Not on file  . Food insecurity:    Worry: Not on file    Inability: Not on file  . Transportation needs:  Medical: Not on file    Non-medical: Not on file  Tobacco Use  . Smoking status: Never Smoker  . Smokeless tobacco: Never Used  Substance and Sexual Activity  . Alcohol use: No  . Drug use: Not Currently  . Sexual activity: Never  Lifestyle  . Physical activity:    Days per week: Not on file    Minutes per session: Not on file  . Stress: Not on file  Relationships  . Social connections:    Talks on phone: Not on file    Gets together: Not on file    Attends religious service: Not on file    Active member of club or organization: Not on file    Attends meetings of clubs or  organizations: Not on file    Relationship status: Not on file  . Intimate partner violence:    Fear of current or ex partner: Not on file    Emotionally abused: Not on file    Physically abused: Not on file    Forced sexual activity: Not on file  Other Topics Concern  . Not on file  Social History Narrative  . Not on file    Family History:  Family History  Problem Relation Age of Onset  . Arthritis Mother   . Hypertension Mother   . Obesity Sister     Medications:   Current Outpatient Medications on File Prior to Visit  Medication Sig Dispense Refill  . Ascorbic Acid (VITAMIN C) 100 MG tablet Take 100 mg by mouth every other day.     Marland Kitchen aspirin EC 81 MG tablet Take 81 mg by mouth at bedtime.    Marland Kitchen atorvastatin (LIPITOR) 20 MG tablet Take 1 tablet (20 mg total) by mouth daily at 6 PM. 30 tablet 0  . B Complex-Biotin-FA (VITAMIN B50 COMPLEX PO) Take 1 tablet by mouth every other day.    . Cholecalciferol (VITAMIN D-3) 1000 units CAPS Take 1,000 Units by mouth daily.    . clopidogrel (PLAVIX) 75 MG tablet Take 1 tablet (75 mg total) by mouth daily. 30 tablet 0  . Cyanocobalamin (B-12) 250 MCG TABS Take 250 mcg by mouth every other day.     . ibuprofen (ADVIL,MOTRIN) 200 MG tablet Take 200 mg by mouth every 6 (six) hours as needed (for back pain).    . irbesartan (AVAPRO) 150 MG tablet Take 150 mg by mouth daily.     Marland Kitchen levothyroxine (SYNTHROID, LEVOTHROID) 100 MCG tablet Take 100 mcg by mouth daily before breakfast.    . meloxicam (MOBIC) 15 MG tablet Take 15 mg by mouth daily as needed (for back pain).     . Omega-3 Fatty Acids (FISH OIL PO) Take 1,000 mg by mouth.    Marland Kitchen OVER THE COUNTER MEDICATION     . Potassium 99 MG TABS Take by mouth.     No current facility-administered medications on file prior to visit.     Allergies:   Allergies  Allergen Reactions  . Gluten Meal Nausea And Vomiting    Patient has CELIAC DISEASE  . Wheat Bran Nausea And Vomiting    Patient has  CELIAC DISEASE  . Adhesive [Tape] Other (See Comments)    Patient's skin is VERY THIN and tears very easily; please use either paper tape or Coban wrap  . Codeine Nausea And Vomiting  . Other Other (See Comments)    Patient has Hypoglycemia; her husband stated "when her sugar crashes, it crashes hard."  Physical Exam  Vitals:   03/09/18 1458  BP: 140/77  Pulse: 89   There is no height or weight on file to calculate BMI. No exam data present  General: well developed, pleasant elderly Caucasian female, well nourished, seated, in no evident distress Head: head normocephalic and atraumatic.   Neck: supple with no carotid or supraclavicular bruits Cardiovascular: regular rate and rhythm, no murmurs Musculoskeletal: no deformity Skin:  no rash/petichiae Vascular:  Normal pulses all extremities  Neurologic Exam Mental Status: Awake and fully alert. Oriented to place and time. Recent and remote memory intact. Attention span, concentration and fund of knowledge appropriate. Mood and affect appropriate.  Cranial Nerves: Fundoscopic exam reveals sharp disc margins. Pupils equal, briskly reactive to light. Extraocular movements full without nystagmus. Visual fields full to confrontation. Hearing intact. Facial sensation intact. Face, tongue, palate moves normally and symmetrically.  Motor: Normal bulk and tone. Normal strength in all tested extremity muscles. Sensory.: intact to touch , pinprick , position and vibratory sensation.  Coordination: Rapid alternating movements normal in all extremities. Finger-to-nose and heel-to-shin performed accurately bilaterally. Gait and Station: Arises from chair without difficulty. Stance is normal. Gait demonstrates normal stride length and balance . Able to heel, toe and tandem walk without difficulty.  Reflexes: 1+ and symmetric. Toes downgoing.    NIHSS  0 Modified Rankin  1    Diagnostic Data (Labs, Imaging, Testing)  CT HEAD WO  CONTRAST 02/07/2018 IMPRESSION: 1. No acute intracranial hemorrhage. 2. Age-related atrophy and chronic microvascular ischemic changes.  MR BRAIN WO CONTRAST 02/08/18 IMPRESSION: 1. Approximate 2 cm acute ischemic nonhemorrhagic ventral right thalamic infarct. 2. Underlying moderate chronic small vessel ischemic disease.  MR MRA HEAD WO CONTRAST 02/08/2018 IMPRESSION: 1. No acute finding. 2. Atheromatous narrowings that are mild at the P2 segments and moderate at the right MCA bifurcation.  ECHOCARDIOGRAM 02/09/2018 Impressions: - LVEF 60-65%, normal wall thickness, normal wall motion, grade 1   DD, elevated LV filling pressure, aortic valve calcification   without stenosis, but trivial AI, normal LA size, trivial TR,   RVSP 23 mmHg, normal IVC.  VAS US CAROTID DUPLEX BILATERAL 02/08/2018 Final Interpretation: Right Carotid: Velocities in the right ICA are consistent with a 1-39% stenosis. Left Carotid: Velocities in the left ICA are consistent with a 1-39% stenosis. Vertebrals: Bilateral vertebral arteries demonstrate antegrade flow.     ASSESSMENT: Caroline [name] is a 82 y.o. year old female here with right thalamic infarct on 02/07/2018 secondary to small vessel disease. Vascular risk factors include HLD.    PLAN: -Continue clopidogrel 75 mg daily  and lipitor  for secondary stroke prevention -Stop aspirin and continue Plavix only as 3-week of DAPT therapy has been completed -Advised patient to take Tylenol as needed for pain and avoid Advil -Research department to inform patient about recent study from memory loss -Advised patient to do mind exercises such as to do good, word searches, crossword puzzles, or card games -Patient cleared to start driving but will be supervised by her husband for the first few times and then if she is able to drive alone, she is aware to only drive to places that she is aware of, short distance from her house and avoiding main highways/roads.   Patient and husband are in agreement to this -F/u with PCP regarding your HLD and HTN management -continue to monitor BP at home  -Maintain strict control of hypertension with blood pressure goal below 130/90, diabetes with hemoglobin A1c goal below 6.5% and cholesterol  with LDL cholesterol (bad cholesterol) goal below 70 mg/dL. I also advised the patient to eat a healthy diet with plenty of whole grains, cereals, fruits and vegetables, exercise regularly and maintain ideal body weight.  Follow up in 4 months or call earlier if needed   Greater than 50% of time during this 25 minute visit was spent on counseling,explanation of diagnosis of right thalamic infarct, reviewing risk factor management of HLD and HTN, planning of further management, discussion with patient and family and coordination of care    George Hugh, [name]  [facility] [address] [place], [place] [address]  Phone [phone] Fax [phone]

## 2018-03-10 NOTE — Progress Notes (Signed)
I agree with the above plan 

## 2018-03-20 ENCOUNTER — Ambulatory Visit: Payer: Medicare Other | Admitting: Adult Health

## 2018-07-10 ENCOUNTER — Ambulatory Visit: Payer: Medicare Other | Admitting: Adult Health

## 2018-07-10 ENCOUNTER — Encounter: Payer: Self-pay | Admitting: Adult Health

## 2018-07-10 VITALS — BP 142/82 | HR 76 | Resp 20 | Ht 62.0 in | Wt 148.6 lb

## 2018-07-10 DIAGNOSIS — E785 Hyperlipidemia, unspecified: Secondary | ICD-10-CM

## 2018-07-10 DIAGNOSIS — I10 Essential (primary) hypertension: Secondary | ICD-10-CM | POA: Diagnosis not present

## 2018-07-10 DIAGNOSIS — I639 Cerebral infarction, unspecified: Secondary | ICD-10-CM | POA: Diagnosis not present

## 2018-07-10 DIAGNOSIS — I6381 Other cerebral infarction due to occlusion or stenosis of small artery: Secondary | ICD-10-CM

## 2018-07-10 NOTE — Progress Notes (Signed)
[facility] [address] [place] [name] [address] [phone]       OFFICE FOLLOW UP NOTE  [name] Date of Birth:  June 18, 1936 Medical Record Number:  [id number]   Reason for Referral:  hospital stroke follow up  CHIEF COMPLAINT:  Chief Complaint  Patient presents with  . Hx. of CVA    Denies new stroke sx.  Reports compliance with Plavix, Lipitor. Stopped ASA as directed. Sts. she is having more fatigue./fim    HPI: Caroline [name] is being seen today in the office for right thalamic infarct on 02/07/18. History obtained from patient and chart review. Reviewed all radiology images and labs personally.  [name] is a 82 y.o. female with history of hypothyroidism and hyperlipidemia presenting with confusion, gait abnormality and headache. CT head reviewed and showed no acute hemorrhage or no abnormality.  MRI head reviewed and shows small right thalamic infarct along with small vessel disease.  MRA showed no acute finding except for mild arthrosclerosis.  Carotid Doppler showed bilateral ICA stenosis of 1 to 39%.  2D echo showed an EF of 60 to 65%.  LDL 107 this patient was not prescribed statin PTA it was recommended to start Lipitor 20 mg.  Patient was taking aspirin 81 mg PTA and recommended DAPT with aspirin and Plavix for 3 weeks and then continue Plavix alone.  PT/OT recommended inpatient rehab.  03/09/2018 visit: Patient is being seen today for hospital follow-up and is accompanied by her husband.  She states all symptoms have resolved but husband does have concerns of continued memory loss.  She continues to take both aspirin and Plavix without side effects of bleeding or bruising as they were confused regarding what medication to stop.  Continues to take Lipitor without side effects myalgias.  Blood pressure today 140/77 and they do check this at home which typically runs between 1 30-1 40.  She has continued doing all previous activities and lives alone  independently with her husband.  Patient is interested in memory trial where they did inquire one at [facility] but never received a call back.  Denies new or worsening stroke/TIA symptoms.  Interval history 07/10/2018: Patient is being seen today for follow-up appointment and is accompanied by her husband.  She states overall she is been doing "okay" but has continued complaints of fatigue.  She states she can go for approximately 2 hours such as cleaning the house but then will have to sit down for approximately 1 hour and then will be able to resume prior activities.  Patient is frustrated by this and she wishes to return to her prior functioning level but feels as though this may not happen.  Denies any difficulty sleeping during the night.  During questioning, patient did realize that she mainly experiences this increased fatigue with more strenuous activities as she was recently on vacation and was able to walk around looking at different shops and did not experience the fatigue.  She does state that her memory has been stable without any worsening and has been able to drive without any difficulties.  She continues to take Plavix without side effects of bleeding or bruising.  Continues to take Lipitor without side effects myalgias.  Blood pressure today 142/82.  Denies new or worsening stroke/TIA symptoms.   ROS:   14 system review of systems performed and negative with exception of fatigue, moles, cough, incontinence, urination problems, easy bruising, increased thirst, depression, too much sleep and decreased energy  PMH:  Past Medical History:  Diagnosis Date  . Anxiety   . Arthritis   . Celiac sprue   . DVT (deep vein thrombosis) in pregnancy   . GERD (gastroesophageal reflux disease)   . Hormone replacement therapy (HRT)   . Hypercholesteremia   . Hyperlipidemia   . Hypertension   . Hypoglycemia   . Hypothyroidism   . OAB (overactive bladder)   . Osteoporosis   . Scoliosis   . Seasonal  allergies   . Senile purpura (HCC)   . Thrombophlebitis     PSH:  Past Surgical History:  Procedure Laterality Date  . ABDOMINAL HYSTERECTOMY  1979  . knee surgery Left     Social History:  Social History   Socioeconomic History  . Marital status: Married    Spouse name: Not on file  . Number of children: 3  . Years of education: 64  . Highest education level: Not on file  Occupational History  . Occupation: retired  Engineer, production  . Financial resource strain: Not on file  . Food insecurity:    Worry: Not on file    Inability: Not on file  . Transportation needs:    Medical: Not on file    Non-medical: Not on file  Tobacco Use  . Smoking status: Never Smoker  . Smokeless tobacco: Never Used  Substance and Sexual Activity  . Alcohol use: No  . Drug use: Not Currently  . Sexual activity: Never  Lifestyle  . Physical activity:    Days per week: Not on file    Minutes per session: Not on file  . Stress: Not on file  Relationships  . Social connections:    Talks on phone: Not on file    Gets together: Not on file    Attends religious service: Not on file    Active member of club or organization: Not on file    Attends meetings of clubs or organizations: Not on file    Relationship status: Not on file  . Intimate partner violence:    Fear of current or ex partner: Not on file    Emotionally abused: Not on file    Physically abused: Not on file    Forced sexual activity: Not on file  Other Topics Concern  . Not on file  Social History Narrative  . Not on file    Family History:  Family History  Problem Relation Age of Onset  . Arthritis Mother   . Hypertension Mother   . Obesity Sister     Medications:   Current Outpatient Medications on File Prior to Visit  Medication Sig Dispense Refill  . Ascorbic Acid (VITAMIN C) 100 MG tablet Take 100 mg by mouth every other day.     . B Complex-Biotin-FA (VITAMIN B50 COMPLEX PO) Take 1 tablet by mouth every  other day.    . Cholecalciferol (VITAMIN D-3) 1000 units CAPS Take 1,000 Units by mouth daily.    . clopidogrel (PLAVIX) 75 MG tablet Take 1 tablet (75 mg total) by mouth daily. 90 tablet 3  . Coenzyme Q10 (COQ10 PO) Take by mouth.    . Cyanocobalamin (B-12) 250 MCG TABS Take 250 mcg by mouth every other day.     . irbesartan (AVAPRO) 150 MG tablet Take 150 mg by mouth daily.     Marland Kitchen levothyroxine (SYNTHROID, LEVOTHROID) 100 MCG tablet Take 100 mcg by mouth daily before breakfast.    . Omega-3 Fatty Acids (FISH OIL PO) Take 1,000  mg by mouth.    . oxybutynin (DITROPAN XL) 10 MG 24 hr tablet Take 10 mg by mouth at bedtime.    . Potassium 99 MG TABS Take by mouth.    Marland Kitchen atorvastatin (LIPITOR) 20 MG tablet Take 1 tablet (20 mg total) by mouth daily at 6 PM. 30 tablet 0   No current facility-administered medications on file prior to visit.     Allergies:   Allergies  Allergen Reactions  . Gluten Meal Nausea And Vomiting    Patient has CELIAC DISEASE  . Wheat Bran Nausea And Vomiting    Patient has CELIAC DISEASE  . Adhesive [Tape] Other (See Comments)    Patient's skin is VERY THIN and tears very easily; please use either paper tape or Coban wrap  . Codeine Nausea And Vomiting  . Other Other (See Comments)    Patient has Hypoglycemia; her husband stated "when her sugar crashes, it crashes hard."     Physical Exam  Vitals:   07/10/18 1505  BP: (!) 142/82  Pulse: 76  Resp: 20  Weight: 148 lb 9.6 oz (67.4 kg)  Height: 5\' 2"  (1.575 m)   Body mass index is 27.18 kg/m. No exam data present  General: well developed, pleasant elderly Caucasian female, well nourished, seated, in no evident distress Head: head normocephalic and atraumatic.   Neck: supple with no carotid or supraclavicular bruits Cardiovascular: regular rate and rhythm, no murmurs Musculoskeletal: no deformity Skin:  no rash/petichiae Vascular:  Normal pulses all extremities  Neurologic Exam Mental Status: Awake  and fully alert. Oriented to place and time. Recent and remote memory intact. Attention span, concentration and fund of knowledge appropriate. Mood and affect appropriate.  Cranial Nerves: Fundoscopic exam reveals sharp disc margins. Pupils equal, briskly reactive to light. Extraocular movements full without nystagmus. Visual fields full to confrontation. Hearing intact. Facial sensation intact. Face, tongue, palate moves normally and symmetrically.  Motor: Normal bulk and tone. Normal strength in all tested extremity muscles. Sensory.: intact to touch , pinprick , position and vibratory sensation.  Coordination: Rapid alternating movements normal in all extremities. Finger-to-nose and heel-to-shin performed accurately bilaterally. Gait and Station: Arises from chair without difficulty. Stance is normal. Gait demonstrates normal stride length and balance . Able to heel, toe and tandem walk without difficulty.  Reflexes: 1+ and symmetric. Toes downgoing.      Diagnostic Data (Labs, Imaging, Testing)  CT HEAD WO CONTRAST 02/07/2018 IMPRESSION: 1. No acute intracranial hemorrhage. 2. Age-related atrophy and chronic microvascular ischemic changes.  MR BRAIN WO CONTRAST 02/08/18 IMPRESSION: 1. Approximate 2 cm acute ischemic nonhemorrhagic ventral right thalamic infarct. 2. Underlying moderate chronic small vessel ischemic disease.  MR MRA HEAD WO CONTRAST 02/08/2018 IMPRESSION: 1. No acute finding. 2. Atheromatous narrowings that are mild at the P2 segments and moderate at the right MCA bifurcation.  ECHOCARDIOGRAM 02/09/2018 Impressions: - LVEF 60-65%, normal wall thickness, normal wall motion, grade 1   DD, elevated LV filling pressure, aortic valve calcification   without stenosis, but trivial AI, normal LA size, trivial TR,   RVSP 23 mmHg, normal IVC.  VAS US CAROTID DUPLEX BILATERAL 02/08/2018 Final Interpretation: Right Carotid: Velocities in the right ICA are consistent with a  1-39% stenosis. Left Carotid: Velocities in the left ICA are consistent with a 1-39% stenosis. Vertebrals: Bilateral vertebral arteries demonstrate antegrade flow.     ASSESSMENT: Sherissa Tenenbaum is a 82 y.o. year old female here with right thalamic infarct on 02/07/2018 secondary to small vessel  disease. Vascular risk factors include HLD.  Patient is being seen today for follow-up visit and overall has been stable from a stroke standpoint but does have ongoing complaints of increased fatigue with increased activity.    PLAN: -Continue clopidogrel 75 mg daily  and lipitor  for secondary stroke prevention -f/u with PCP for continued management of cholesterol blood pressure -Recommended to do more strenuous activities at a slower rate and for less time as patient does admit to pushing herself continuously until she gets to the point where she can no longer continue.  Long discussion regarding this as it is unable to determine if this will be the patient's new normal but there is a chance that she could see some improvement.  Also recommended to do low intensity exercising for at least 30 continuous minutes daily even if this is for a walk. -Advised patient to do mind exercises such as to do good, word searches, crossword puzzles, or card games -continue to monitor BP at home -Maintain strict control of hypertension with blood pressure goal below 130/90, diabetes with hemoglobin A1c goal below 6.5% and cholesterol with LDL cholesterol (bad cholesterol) goal below 70 mg/dL. I also advised the patient to eat a healthy diet with plenty of whole grains, cereals, fruits and vegetables, exercise regularly and maintain ideal body weight.  Follow up in 6 months or call earlier if needed   Greater than 50% of time during this 25 minute visit was spent on counseling,explanation of diagnosis of right thalamic infarct, reviewing risk factor management of HLD and HTN, planning of further management, discussion with  patient and family and coordination of care    George Hugh, [facility]  [facility] [address] [place], [place] [address]  Phone [phone] Fax [phone]

## 2018-07-10 NOTE — Patient Instructions (Signed)
Continue clopidogrel 75 mg daily  and lipitor  for secondary stroke prevention  Continue to follow up with PCP regarding cholesterol and blood pressure management   Try to do activities at a slower rate for less time to see if this helps with fatigue. This could take time to get better and by preventing over exertion could help  Continue to do mind exercises for memory loss such as suduku, crossword, word search, reading and card games   Continue to stay active as tolerated and maintain a healthy diet   Continue to monitor blood pressure at home  Maintain strict control of hypertension with blood pressure goal below 130/90, diabetes with hemoglobin A1c goal below 6.5% and cholesterol with LDL cholesterol (bad cholesterol) goal below 70 mg/dL. I also advised the patient to eat a healthy diet with plenty of whole grains, cereals, fruits and vegetables, exercise regularly and maintain ideal body weight.  Followup in the future with me in 6 months or call earlier if needed       Thank you for coming to see [place] at [facility]. I hope we have been able to provide you high quality care today.  You may receive a patient satisfaction survey over the next few weeks. We would appreciate your feedback and comments so that we may continue to improve ourselves and the health of our patients.

## 2018-07-11 NOTE — Progress Notes (Signed)
I agree with the above plan 

## 2018-10-13 ENCOUNTER — Encounter: Payer: Self-pay | Admitting: Gastroenterology

## 2018-11-01 ENCOUNTER — Encounter: Payer: Self-pay | Admitting: Gastroenterology

## 2018-11-01 ENCOUNTER — Ambulatory Visit: Payer: Medicare Other | Admitting: Gastroenterology

## 2018-11-01 VITALS — BP 152/70 | HR 92 | Ht 61.0 in | Wt 150.5 lb

## 2018-11-01 DIAGNOSIS — R112 Nausea with vomiting, unspecified: Secondary | ICD-10-CM

## 2018-11-01 DIAGNOSIS — K900 Celiac disease: Secondary | ICD-10-CM | POA: Diagnosis not present

## 2018-11-01 NOTE — Patient Instructions (Signed)
If you are age 81 or older, your body mass index should be between 23-30. Your Body mass index is 28.44 kg/m. If this is out of the aforementioned range listed, please consider follow up with your Primary Care Provider.  If you are age 64 or younger, your body mass index should be between 19-25. Your Body mass index is 28.44 kg/m. If this is out of the aformentioned range listed, please consider follow up with your Primary Care Provider.   You have been scheduled for an endoscopy. Please follow written instructions given to you at your visit today. If you use inhalers (even only as needed), please bring them with you on the day of your procedure. Your physician has requested that you go to www.startemmi.com and enter the access code given to you at your visit today. This web site gives a general overview about your procedure. However, you should still follow specific instructions given to you by our office regarding your preparation for the procedure.  It was a pleasure to see you today!  Dr. Danis  

## 2018-11-01 NOTE — Progress Notes (Signed)
Caroline [name] 11/01/2018  Referring physician: Gaspar Garbe, MD  Reason for consult/chief complaint: Emesis (soon after eating, moth fills up with water and then food comes back up) and Heartburn (burning sensation right between breast)   Subjective  HPI:  This is a very pleasant 83 year old woman referred by primary care and last seen by Dr. Juanda Chance in 2003.  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 Caroline [name] has remained on a gluten-free diet ever since then.  Caroline [name] was referred by primary care for about 6 months of frequent "vomiting".  She says that soon after meals will bring up a lot of liquid and usually a small amount of food.  This will typically happen within 30 to 60 minutes after the meals, times precipitated by bending over.  She has had a few episodes of heartburn with a substernal burning discomfort that is nonexertional and resolves after she stands up.  She denies dyspnea radiation of the pain or any exertional component.  Her bowel habits have been regular without rectal bleeding.  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  I reviewed her most recent neurology office note from October, where she is being maintained on Plavix.  ROS:  Review of Systems  Constitutional: Positive for fatigue. Negative for appetite change and unexpected weight change.  HENT: Negative for mouth sores and voice change.   Eyes: Negative for pain and redness.  Respiratory: Positive for cough. Negative for shortness of breath.   Cardiovascular: Positive for leg swelling. Negative for chest pain and palpitations.  Genitourinary: Positive for enuresis and frequency. Negative for dysuria and hematuria.         She is currently undergoing bladder therapy with [facility]  Musculoskeletal: Positive for back pain. Negative for arthralgias and myalgias.       Scoliosis  Skin: Negative for pallor and rash.  Neurological: Negative for weakness and headaches.  Hematological: Negative for adenopathy.   No weight loss   Past Medical History: Past Medical History:  Diagnosis Date  . Anxiety   . Arthritis   . Celiac sprue   . CVA (cerebral vascular accident) (HCC) 02/2018   R thalamic CVA  . DVT (deep vein thrombosis) in pregnancy   . GERD (gastroesophageal reflux disease)   . Glaucoma   . Hormone replacement therapy (HRT)   . Hypercholesteremia   . Hyperlipidemia   . Hypertension   . Hypoglycemia   . Hypothyroidism   . OAB (overactive bladder)   . Osteoporosis   . Raynaud's disease   . Scoliosis   . Seasonal allergies   . Senile purpura (HCC)   . Thrombophlebitis      Past Surgical History: Past Surgical History:  Procedure Laterality Date  . ABDOMINAL HYSTERECTOMY  1979  . knee surgery Left      Family History: Family History  Problem Relation Age of Onset  . Arthritis Mother        pt denies  . Hypertension Mother        pt denies  . Obesity Sister   . Cancer Maternal Uncle        type unknown    Social History: Social History   Socioeconomic History  . Marital status: Married  Spouse name: Not on file  . Number of children: 3  . Years of education: 1912  . Highest education level: Not on file  Occupational History  . Occupation: retired  Engineer, productionocial Needs  . Financial resource strain: Not on file  . Food insecurity:    Worry: Not on file    Inability: Not on file  . Transportation needs:    Medical: Not on file    Non-medical: Not on file  Tobacco Use  . Smoking status: Never Smoker  . Smokeless tobacco: Never Used  Substance and Sexual Activity  . Alcohol use: No  . Drug use: Not Currently  . Sexual activity: Never  Lifestyle  . Physical  activity:    Days per week: Not on file    Minutes per session: Not on file  . Stress: Not on file  Relationships  . Social connections:    Talks on phone: Not on file    Gets together: Not on file    Attends religious service: Not on file    Active member of club or organization: Not on file    Attends meetings of clubs or organizations: Not on file    Relationship status: Not on file  Other Topics Concern  . Not on file  Social History Narrative  . Not on file    Allergies: Allergies  Allergen Reactions  . Gluten Meal Nausea And Vomiting    Patient has CELIAC DISEASE  . Wheat Bran Nausea And Vomiting    Patient has CELIAC DISEASE  . Adhesive [Tape] Other (See Comments)    Patient's skin is VERY THIN and tears very easily; please use either paper tape or Coban wrap  . Codeine Nausea And Vomiting  . Other Other (See Comments)    Patient has Hypoglycemia; her husband stated "when her sugar crashes, it crashes hard."    Outpatient Meds: Current Outpatient Medications  Medication Sig Dispense Refill  . Ascorbic Acid (VITAMIN C) 100 MG tablet Take 100 mg by mouth every other day.     Marland Kitchen. atorvastatin (LIPITOR) 20 MG tablet Take 1 tablet (20 mg total) by mouth daily at 6 PM. 30 tablet 0  . B Complex-Biotin-FA (VITAMIN B50 COMPLEX PO) Take 1 tablet by mouth every other day.    . Cholecalciferol (VITAMIN D-3) 1000 units CAPS Take 1,000 Units by mouth daily.    . clopidogrel (PLAVIX) 75 MG tablet Take 75 mg by mouth daily.    . Coenzyme Q10 (COQ10 PO) Take by mouth.    . Cyanocobalamin (B-12) 250 MCG TABS Take 250 mcg by mouth every other day.     . hydrochlorothiazide (MICROZIDE) 12.5 MG capsule Take 12.5 mg by mouth as needed.    . irbesartan (AVAPRO) 150 MG tablet Take 150 mg by mouth daily.     Marland Kitchen. levothyroxine (SYNTHROID, LEVOTHROID) 125 MCG tablet Take 1 tablet by mouth daily.    . Omega-3 Fatty Acids (FISH OIL PO) Take 1,000 mg by mouth.    . Potassium 99 MG TABS Take by  mouth.     No current facility-administered medications for this visit.       ___________________________________________________________________ Objective   Exam:  BP (!) 152/70 (BP Location: Left Arm, Patient Position: Sitting, Cuff Size: Normal)   Pulse 92   Ht 5\' 1"  (1.549 m) Comment: height measured without shoes  Wt 150 lb 8 oz (68.3 kg)   BMI 28.44 kg/m    General: this is a(n) well-appearing woman with  no dysarthria.  Eyes: sclera anicteric, no redness  ENT: oral mucosa moist without lesions, no cervical or supraclavicular lymphadenopathy  CV: RRR without murmur, S1/S2, no JVD, no peripheral edema  Resp: clear to auscultation bilaterally, normal RR and effort noted  GI: soft, no tenderness, with active bowel sounds. No guarding or palpable organomegaly noted.  Skin; warm and dry, no rash or jaundice noted  Neuro: awake, alert and oriented x 3. Normal gross motor function and fluent speech  Labs:  Primary care labs from 10/06/2018: Normal CBC and CMP  TSH elevated at 29, free T4 low at 0.6 Caroline [name](Caroline [name] says her dose was increased)  Radiologic Studies:  LVEF normal May 2019  Assessment: Encounter Diagnoses  Name Primary?  . Nausea and vomiting in adult Yes  . Celiac sprue     The symptoms are usually preceded by water brash and nausea.  It is difficult to tell if it is true vomiting or severe regurgitation.  This raises concern for sizable hiatal hernia or gastric outlet obstruction.  She has a longstanding sprue diagnosis and has been on a gluten-free diet( except for the usual occasional indiscretions), and does not have malabsorption syndrome to suggest evolution into lymphoma or collagenous sprue. She does not appear clinically hypothyroid to explain these upper digestive symptoms.  Plan:  Ideally, I would like to perform upper endoscopy if she could be off Plavix 5 days prior.  We have made a tentative schedule for an endoscopy several weeks from now and  we will communicate with her neurologist for opinion on briefly holding Plavix.  If the risk is felt to be acceptable, we will proceed with upper endoscopy.  If not, we will cancel the procedure schedule barium upper GI series.  Her endoscopy was discussed in detail and she is agreeable after discussion of procedure and risks.  She is aware of the cerebrovascular risk if she is off Plavix 5 days before and perhaps up to a day or 2 afterwards if any intervention is done such as dilation.  Increased risk for procedure due to medical comorbidities.  Thank you for the courtesy of this consult.  Please call me with any questions or concerns.  Charlie PitterHenry L Danis III  CC: Referring provider noted above

## 2018-11-01 NOTE — Progress Notes (Deleted)
[facility] Consult Note:  History: Caroline [name] 11/01/2018  Referring physician: Gaspar Garbeisovec, Richard W, MD  Reason for consult/chief complaint: No chief complaint on file.   Subjective  HPI:  ***  She suffered a right thalamic infarct May 2019.  October 2019 neurology follow-up note indicates residual memory loss and chronic fatigue, no other neurologic residual deficits.  She saw Dr. Juanda ChanceBrodie in December 2003 for iron deficiency anemia.  Colonoscopy was normal.  Upper endoscopy showed flattened duodenal folds, and biopsies were consistent with celiac sprue showing lymphoplasmacytic infiltrate and almost complete atrophy of villi.  If any serologies were done, they are not available in this EMR.  ROS:  Review of Systems   Past Medical History: Past Medical History:  Diagnosis Date  . Anxiety   . Arthritis   . Celiac sprue   . CVA (cerebral vascular accident) (HCC) 02/2018   R thalamic CVA  . DVT (deep vein thrombosis) in pregnancy   . GERD (gastroesophageal reflux disease)   . Hormone replacement therapy (HRT)   . Hypercholesteremia   . Hyperlipidemia   . Hypertension   . Hypoglycemia   . Hypothyroidism   . OAB (overactive bladder)   . Osteoporosis   . Raynaud's disease   . Scoliosis   . Seasonal allergies   . Senile purpura (HCC)   . Thrombophlebitis      Past Surgical History: Past Surgical History:  Procedure Laterality Date  . ABDOMINAL HYSTERECTOMY  1979  . knee surgery Left      Family History: Family History  Problem Relation Age of Onset  . Arthritis Mother   . Hypertension Mother   . Obesity Sister     Social History: Social History   Socioeconomic History  . Marital status: Married    Spouse name: Not on file  . Number of children: 3  . Years of education: 3012  . Highest education level: Not on file  Occupational History  . Occupation: retired  Engineer, productionocial Needs  . Financial resource strain: Not on file  . Food  insecurity:    Worry: Not on file    Inability: Not on file  . Transportation needs:    Medical: Not on file    Non-medical: Not on file  Tobacco Use  . Smoking status: Never Smoker  . Smokeless tobacco: Never Used  Substance and Sexual Activity  . Alcohol use: No  . Drug use: Not Currently  . Sexual activity: Never  Lifestyle  . Physical activity:    Days per week: Not on file    Minutes per session: Not on file  . Stress: Not on file  Relationships  . Social connections:    Talks on phone: Not on file    Gets together: Not on file    Attends religious service: Not on file    Active member of club or organization: Not on file    Attends meetings of clubs or organizations: Not on file    Relationship status: Not on file  Other Topics Concern  . Not on file  Social History Narrative  . Not on file    Allergies: Allergies  Allergen Reactions  . Gluten Meal Nausea And Vomiting    Patient has CELIAC DISEASE  . Wheat Bran Nausea And Vomiting    Patient has CELIAC DISEASE  . Adhesive [Tape] Other (See Comments)    Patient's skin is VERY THIN and tears very easily; please use either paper tape or Coban wrap  .  Codeine Nausea And Vomiting  . Other Other (See Comments)    Patient has Hypoglycemia; her husband stated "when her sugar crashes, it crashes hard."    Outpatient Meds: Current Outpatient Medications  Medication Sig Dispense Refill  . Ascorbic Acid (VITAMIN C) 100 MG tablet Take 100 mg by mouth every other day.     Marland Kitchen atorvastatin (LIPITOR) 20 MG tablet Take 1 tablet (20 mg total) by mouth daily at 6 PM. 30 tablet 0  . B Complex-Biotin-FA (VITAMIN B50 COMPLEX PO) Take 1 tablet by mouth every other day.    . Cholecalciferol (VITAMIN D-3) 1000 units CAPS Take 1,000 Units by mouth daily.    . Coenzyme Q10 (COQ10 PO) Take by mouth.    . Cyanocobalamin (B-12) 250 MCG TABS Take 250 mcg by mouth every other day.     . irbesartan (AVAPRO) 150 MG tablet Take 150 mg by  mouth daily.     Marland Kitchen levothyroxine (SYNTHROID, LEVOTHROID) 100 MCG tablet Take 100 mcg by mouth daily before breakfast.    . Omega-3 Fatty Acids (FISH OIL PO) Take 1,000 mg by mouth.    . oxybutynin (DITROPAN XL) 10 MG 24 hr tablet Take 10 mg by mouth at bedtime.    . Potassium 99 MG TABS Take by mouth.     No current facility-administered medications for this visit.       ___________________________________________________________________ Objective   Exam:  There were no vitals taken for this visit.   General: this is a(n) ***   Eyes: sclera anicteric, no redness  ENT: oral mucosa moist without lesions, no cervical or supraclavicular lymphadenopathy  CV: RRR without murmur, S1/S2, no JVD, no peripheral edema  Resp: clear to auscultation bilaterally, normal RR and effort noted  GI: soft, *** tenderness, with active bowel sounds. No guarding or palpable organomegaly noted.  Skin; warm and dry, no rash or jaundice noted  Neuro: awake, alert and oriented x 3. Normal gross motor function and fluent speech  Labs:  CBC Latest Ref Rng & Units 02/10/2018 02/07/2018 02/07/2018  WBC 4.0 - 10.5 K/uL 6.0 - 8.4  Hemoglobin 12.0 - 15.0 g/dL 76.2 26.3 33.5  Hematocrit 36.0 - 46.0 % 39.6 41.0 40.1  Platelets 150 - 400 K/uL 283 - 305   CMP Latest Ref Rng & Units 02/10/2018 02/07/2018 02/07/2018  Glucose 65 - 99 mg/dL 88 90 92  BUN 6 - 20 mg/dL 18 45(G) 18  Creatinine 0.44 - 1.00 mg/dL 2.56 3.89 3.73  Sodium 135 - 145 mmol/L 140 141 140  Potassium 3.5 - 5.1 mmol/L 4.1 3.8 4.1  Chloride 101 - 111 mmol/L 105 106 107  CO2 22 - 32 mmol/L 27 - 25  Calcium 8.9 - 10.3 mg/dL 4.2(A) - 9.1  Total Protein 6.5 - 8.1 g/dL - - 6.8  Total Bilirubin 0.3 - 1.2 mg/dL - - 0.8  Alkaline Phos 38 - 126 U/L - - 92  AST 15 - 41 U/L - - 34  ALT 14 - 54 U/L - - 19   No iron levels on file  Radiologic Studies:  ***  Assessment: No diagnosis found.  ***  Plan:  ***  Thank you for the courtesy of this  consult.  Please call me with any questions or concerns.  Charlie Pitter III  CC: Referring provider noted above

## 2018-11-03 ENCOUNTER — Other Ambulatory Visit: Payer: Self-pay

## 2018-11-03 ENCOUNTER — Telehealth: Payer: Self-pay

## 2018-11-03 DIAGNOSIS — R112 Nausea with vomiting, unspecified: Secondary | ICD-10-CM

## 2018-11-03 DIAGNOSIS — K900 Celiac disease: Secondary | ICD-10-CM

## 2018-11-03 NOTE — Progress Notes (Signed)
He replied that it is acceptable risk to hold plavix 5 days prior to procedure.  - HD

## 2018-11-03 NOTE — Progress Notes (Signed)
Dr. [name], Thanks so much for your reply.  Sheralyn Boatman,  please see below.  No plavix 5 days prior to procedure.

## 2018-11-03 NOTE — Telephone Encounter (Signed)
Dr Myrtie Neither talked to Dr [name] in Neuro. Per Dr Myrtie Neither, ok to hold her Plavix 5 days prior to her procedure. Left a message for patient to return call.

## 2018-11-06 NOTE — Telephone Encounter (Signed)
Pt return called did advised of the not in epic pt understood will call back if have any further questions

## 2018-11-06 NOTE — Telephone Encounter (Signed)
Patient has been notified, states clear understanding to hold 5 days prior to procedure.

## 2018-11-15 ENCOUNTER — Encounter: Payer: Self-pay | Admitting: Gastroenterology

## 2018-11-29 ENCOUNTER — Ambulatory Visit (AMBULATORY_SURGERY_CENTER): Payer: Medicare Other | Admitting: Gastroenterology

## 2018-11-29 ENCOUNTER — Encounter: Payer: Self-pay | Admitting: Gastroenterology

## 2018-11-29 VITALS — BP 144/72 | HR 70 | Temp 97.5°F | Resp 12 | Ht 61.0 in | Wt 150.0 lb

## 2018-11-29 DIAGNOSIS — K2100 Gastro-esophageal reflux disease with esophagitis, without bleeding: Secondary | ICD-10-CM

## 2018-11-29 DIAGNOSIS — D72820 Lymphocytosis (symptomatic): Secondary | ICD-10-CM | POA: Diagnosis not present

## 2018-11-29 DIAGNOSIS — R112 Nausea with vomiting, unspecified: Secondary | ICD-10-CM

## 2018-11-29 DIAGNOSIS — K2980 Duodenitis without bleeding: Secondary | ICD-10-CM

## 2018-11-29 DIAGNOSIS — K900 Celiac disease: Secondary | ICD-10-CM

## 2018-11-29 MED ORDER — OMEPRAZOLE 40 MG PO CPDR: 40.0000 mg | DELAYED_RELEASE_CAPSULE | Freq: Two times a day (BID) | ORAL | 2 refills | Status: DC

## 2018-11-29 MED ORDER — SODIUM CHLORIDE 0.9 % IV SOLN: 500.0000 mL | Freq: Once | INTRAVENOUS | Status: DC

## 2018-11-29 NOTE — Progress Notes (Signed)
Called to room to assist during endoscopic procedure.  Patient ID and intended procedure confirmed with present staff. Received instructions for my participation in the procedure from the performing physician.  

## 2018-11-29 NOTE — Progress Notes (Signed)
PT taken to PACU. Monitors in place. VSS. Report given to RN. 

## 2018-11-29 NOTE — Patient Instructions (Signed)
Resume plavix at prior dose tomorrow (Thursday) Prilosec ordered - see prescription Repeat EGD in 8 weeks to evaluate therapy  Follow antireflux measures, need to elevate head of bed, may need to get a bed wedge  YOU HAD AN ENDOSCOPIC PROCEDURE TODAY AT THE [facility]:   Refer to the procedure report that was given to you for any specific questions about what was found during the examination.  If the procedure report does not answer your questions, please call your gastroenterologist to clarify.  If you requested that your care partner not be given the details of your procedure findings, then the procedure report has been included in a sealed envelope for you to review at your convenience later.  YOU SHOULD EXPECT: Some feelings of bloating in the abdomen. Passage of more gas than usual.  Walking can help get rid of the air that was put into your GI tract during the procedure and reduce the bloating. If you had a lower endoscopy (such as a colonoscopy or flexible sigmoidoscopy) you may notice spotting of blood in your stool or on the toilet paper. If you underwent a bowel prep for your procedure, you may not have a normal bowel movement for a few days.  Please Note:  You might notice some irritation and congestion in your nose or some drainage.  This is from the oxygen used during your procedure.  There is no need for concern and it should clear up in a day or so.  SYMPTOMS TO REPORT IMMEDIATELY:   Following upper endoscopy (EGD)  Vomiting of blood or coffee ground material  New chest pain or pain under the shoulder blades  Painful or persistently difficult swallowing  New shortness of breath  Fever of 100F or higher  Black, tarry-looking stools  For urgent or emergent issues, a gastroenterologist can be reached at any hour by calling [phone].   DIET:  We do recommend a small meal at first, but then you may proceed to your regular diet.  Drink plenty of fluids but you  should avoid alcoholic beverages for 24 hours.  ACTIVITY:  You should plan to take it easy for the rest of today and you should NOT DRIVE or use heavy machinery until tomorrow (because of the sedation medicines used during the test).    FOLLOW UP: Our staff will call the number listed on your records the next business day following your procedure to check on you and address any questions or concerns that you may have regarding the information given to you following your procedure. If we do not reach you, we will leave a message.  However, if you are feeling well and you are not experiencing any problems, there is no need to return our call.  We will assume that you have returned to your regular daily activities without incident.  If any biopsies were taken you will be contacted by phone or by letter within the next 1-3 weeks.  Please call us at [phone] if you have not heard about the biopsies in 3 weeks.    SIGNATURES/CONFIDENTIALITY: You and/or your care partner have signed paperwork which will be entered into your electronic medical record.  These signatures attest to the fact that that the information above on your After Visit Summary has been reviewed and is understood.  Full responsibility of the confidentiality of this discharge information lies with you and/or your care-partner.

## 2018-11-29 NOTE — Progress Notes (Signed)
Tried to make a new appointment in 8 weeks, but the schedule was not available.  Will have to schedule when available.

## 2018-11-29 NOTE — Op Note (Addendum)
[facility] Patient Name: Caroline [name] Procedure Date: 11/29/2018 9:45 AM MRN: [id number] Endoscopist: Sherilyn Cooter L. Myrtie Neither , MD Age: 83 Referring MD:  Date of Birth: 04-Mar-1936 Gender: Female Account #: [id number] Procedure:                Upper GI endoscopy Indications:              Celiac disease (Dx 2003, no diarrhea or weight loss                            on gluten free diet) , Vomiting (suspected to be                            possible severe regurgitation) Medicines:                Monitored Anesthesia Care Procedure:                Pre-Anesthesia Assessment:                           - Prior to the procedure, a History and Physical                            was performed, and patient medications and                            allergies were reviewed. The patient's tolerance of                            previous anesthesia was also reviewed. The risks                            and benefits of the procedure and the sedation                            options and risks were discussed with the patient.                            All questions were answered, and informed consent                            was obtained. Prior Anticoagulants: The patient has                            taken Plavix (clopidogrel), last dose was 5 days                            prior to procedure. ASA Grade Assessment: III - A                            patient with severe systemic disease. After                            reviewing the risks and benefits, the patient was  deemed in satisfactory condition to undergo the                            procedure.                           After obtaining informed consent, the endoscope was                            passed under direct vision. Throughout the                            procedure, the patient's blood pressure, pulse, and                            oxygen saturations were monitored continuously. The                             Endoscope was introduced through the mouth, and                            advanced to the second part of duodenum. The upper                            GI endoscopy was accomplished without difficulty.                            The patient tolerated the procedure well. Scope In: Scope Out: Findings:                 LA Grade D (one or more mucosal breaks involving at                            least 75% of esophageal circumference) esophagitis                            with bleeding was found in the distal esophagus.                           A 4-5 cm hiatal hernia was present with a possible                            additional paraesophageal component.                           The stomach was normal.                           The cardia and gastric fundus were normal on                            retroflexion.                           Atrophic mucosa was found in the entire duodenum.  Two biopsies were taken with a cold forceps for                            histology from second portion of duodenum, and two                            additional biopsies from the duodenal bulb.                           The exam of the duodenum was otherwise normal. Complications:            No immediate complications. Estimated Blood Loss:     Estimated blood loss was minimal. Impression:               - LA Grade D reflux esophagitis.                           - 4 cm hiatal hernia.                           - Normal stomach.                           - Duodenal mucosal atrophy. Biopsied. Recommendation:           - Patient has a contact number available for                            emergencies. The signs and symptoms of potential                            delayed complications were discussed with the                            patient. Return to normal activities tomorrow.                            Written discharge instructions were provided to the                             patient.                           - Resume previous diet.                           - Resume Plavix (clopidogrel) at prior dose                            tomorrow.                           - Use Prilosec (omeprazole) 40 mg by mouth twice                            daily before breakfast/supper for 8 weeks. Disp                            #  60, RF 2                           - Await pathology results.                           - Repeat upper endoscopy in 8 weeks to evaluate the                            response to therapy.                           - Follow an antireflux regimen indefinitely                            (especially elevating head of bed - obtain a bed                            wedge). Emmanuelle Hibbitts L. Myrtie Neither, MD 11/29/2018 10:32:37 AM This report has been signed electronically.

## 2018-11-30 ENCOUNTER — Telehealth: Payer: Self-pay

## 2018-11-30 NOTE — Telephone Encounter (Signed)
  Follow up Call-  Call back number 11/29/2018  Post procedure Call Back phone  # [phone]  Permission to leave phone message Yes  Some recent data might be hidden     Patient questions:  Do you have a fever, pain , or abdominal swelling? No. Pain Score  0 *  Have you tolerated food without any problems? Yes.    Have you been able to return to your normal activities? Yes.    Do you have any questions about your discharge instructions: Diet   No. Medications  No. Follow up visit  No.  Do you have questions or concerns about your Care? No.  Actions: * If pain score is 4 or above: No action needed, pain <4.  Spoke with patient's husband and patient is "doing well."

## 2018-12-25 ENCOUNTER — Other Ambulatory Visit: Payer: Self-pay | Admitting: Adult Health

## 2019-01-01 ENCOUNTER — Ambulatory Visit: Payer: Self-pay | Admitting: Gastroenterology

## 2019-01-02 ENCOUNTER — Telehealth: Payer: Self-pay | Admitting: Gastroenterology

## 2019-01-02 MED ORDER — PANTOPRAZOLE SODIUM 40 MG PO TBEC: 40.0000 mg | DELAYED_RELEASE_TABLET | Freq: Two times a day (BID) | ORAL | 1 refills | Status: DC

## 2019-01-02 NOTE — Telephone Encounter (Signed)
Left message to return call 

## 2019-01-02 NOTE — Telephone Encounter (Signed)
Please let [name] know that I received a notification from her pharmacy commending a change from omeprazole to pantoprazole because of a potential interaction between the omeprazole and Plavix.  I want her to stop the omeprazole and begin pantoprazole 40 mg twice daily.  I sent a prescription for this to her pharmacy.  He is also set up a WebEx or Zoom visit with me in the next few weeks.

## 2019-01-03 NOTE — Telephone Encounter (Signed)
Patient has been notified and aware. She does not have a way to do Webex or zoom. I put her on the recall list for a follow up visit.

## 2019-01-05 ENCOUNTER — Ambulatory Visit: Payer: Self-pay | Admitting: Gastroenterology

## 2019-01-10 ENCOUNTER — Other Ambulatory Visit: Payer: Self-pay

## 2019-01-10 ENCOUNTER — Encounter: Payer: Self-pay | Admitting: Adult Health

## 2019-01-10 ENCOUNTER — Ambulatory Visit (INDEPENDENT_AMBULATORY_CARE_PROVIDER_SITE_OTHER): Payer: Medicare Other | Admitting: Adult Health

## 2019-01-10 DIAGNOSIS — I6381 Other cerebral infarction due to occlusion or stenosis of small artery: Secondary | ICD-10-CM

## 2019-01-10 DIAGNOSIS — I10 Essential (primary) hypertension: Secondary | ICD-10-CM | POA: Diagnosis not present

## 2019-01-10 DIAGNOSIS — I639 Cerebral infarction, unspecified: Secondary | ICD-10-CM

## 2019-01-10 DIAGNOSIS — E785 Hyperlipidemia, unspecified: Secondary | ICD-10-CM | POA: Diagnosis not present

## 2019-01-10 NOTE — Progress Notes (Signed)
[facility] [address] [place]. [place] [address] [phone]     Virtual Visit via Telephone Note  I connected with Caroline [name] on 01/10/19 at  3:15 PM EDT by telephone located remotely within my own home and verified that I am speaking with the correct person using two identifiers who reports being located within her own home.    I discussed the limitations, risks, security and privacy concerns of performing an evaluation and management service by telephone and the availability of in person appointments. I also discussed with the patient that there may be a patient responsible charge related to this service. The patient expressed understanding and agreed to proceed.   History of Present Illness:  Caroline [name] is a 83 y.o. 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  She was initially scheduled for face-to-face office visit today at this time but due to COVID19, face-to-face office visit rescheduled for non-face-to-face telephone visit.   She was last seen in the office on 07/10/2018 with continued complaints of fatigue but otherwise stable from a stroke standpoint.  Since this time, she states she has been doing well with only minimal amount of fatigue. She does endorse having to rest 1x in the afternoon but is able to maintain needed activities around the house along with outdoor work without difficulty. She continues on Plavix without side effects of bleeding or bruising.  Continues on atorvastatin without side effects myalgias.  She does have follow up visit with PCP for full check up and plans on obtaining lab work at that time. Blood pressure is monitored at home with BP 140s/70s.  No further concerns at this time.  Denies new or worsening stroke/TIA symptoms.   Observations/Objective:  *Limited exam due to visit type*  General: Pleasant elderly Caucasian female asking and answering questions  appropriately  Reviewed all recent lab work and imaging   Assessment and Plan:   Caroline [name] is a 83 y.o. year old female here with right thalamic infarct on 02/07/2018 secondary to small vessel disease. Vascular risk factors include HLD and HTN.   She has been doing well from a stroke standpoint without residual deficits or reoccurring of symptoms.  -Continue clopidogrel 75 mg daily  and lipitor  for secondary stroke prevention -f/u with PCP for continued management of cholesterol and blood pressure -Encouraged daily activity and exercising along with maintaining a healthy diet -continue to monitor BP at home -Maintain strict control of hypertension with blood pressure goal below 130/90, diabetes with hemoglobin A1c goal below 6.5% and cholesterol with LDL cholesterol (bad cholesterol) goal below 70 mg/dL. I also advised the patient to eat a healthy diet with plenty of whole grains, cereals, fruits and vegetables, exercise regularly and maintain ideal body weight.   Follow Up Instructions: As she has been stable from a stroke standpoint, recommend to follow-up as needed    I discussed the assessment and treatment plan with the patient.  The patient was provided an opportunity to ask questions and all were answered to their satisfaction. The patient agreed with the plan and verbalized an understanding of the instructions.   I provided 22 minutes of non-face-to-face time during this encounter.    George Hugh, AGNP-BC  [facility] [address] [place], [place] [address]  Phone [phone] Fax [phone] Note: This document was prepared with digital dictation and possible smart phrase technology. Any transcriptional errors that result from this  process are unintentional.

## 2019-01-14 NOTE — Progress Notes (Signed)
I agree with the above plan 

## 2019-02-14 ENCOUNTER — Encounter: Payer: Self-pay | Admitting: Gastroenterology

## 2019-02-28 ENCOUNTER — Telehealth: Payer: Self-pay

## 2019-02-28 NOTE — Telephone Encounter (Signed)
Covid-19 travel screening questions  Have you traveled in the last 14 days? NO If yes where?  Do you now or have you had a fever in the last 14 days? NO  Do you have any respiratory symptoms of shortness of breath or cough now or in the last 14 days? NO  Do you have a medical history of Congestive Heart Failure? NO  Do you have a medical history of lung disease? NO  Do you have any family members or close contacts with diagnosed or suspected Covid-19? NO        

## 2019-03-02 ENCOUNTER — Ambulatory Visit: Payer: Medicare Other | Admitting: Gastroenterology

## 2019-03-02 ENCOUNTER — Encounter: Payer: Self-pay | Admitting: Gastroenterology

## 2019-03-02 ENCOUNTER — Other Ambulatory Visit: Payer: Self-pay

## 2019-03-02 VITALS — BP 136/60 | HR 92 | Temp 97.7°F | Ht 61.0 in | Wt 150.0 lb

## 2019-03-02 DIAGNOSIS — K449 Diaphragmatic hernia without obstruction or gangrene: Secondary | ICD-10-CM | POA: Diagnosis not present

## 2019-03-02 DIAGNOSIS — K2100 Gastro-esophageal reflux disease with esophagitis, without bleeding: Secondary | ICD-10-CM

## 2019-03-02 DIAGNOSIS — K900 Celiac disease: Secondary | ICD-10-CM

## 2019-03-02 NOTE — Progress Notes (Signed)
[facility] Progress Note  Chief Complaint: GERD with esophagitis  Subjective  History: Caroline [name] has a history of celiac sprue, and came to see me several months ago with regurgitation and "vomiting" which sounded more like severe regurgitation.  Upper endoscopy revealed a 4 to 5 cm hiatal hernia and severe erosive esophagitis. The initial plan was to repeat an upper endoscopy in about 8 weeks on high-dose PPI as well as nonpharmacologic antireflux measures, but that was delayed due to COVID.  Caroline [name] is feeling very well overall, and no longer has heartburn or regurgitation.  Her appetite is good and she is put on a few pounds in the last couple of months.  She denies dysphagia or odynophagia. Her pharmacist changed to the twice daily omeprazole to pantoprazole out of concern for potential interaction with clopidogrel.  Caroline [name] seems to have had some confusion about that, as she was continuing to take the omeprazole.  She brought her pills today, and that bottle is empty but she still has capsules in the pantoprazole bottle.  We are confirming that with her pharmacy and will be sure to clarify her medicines with today's instructions.  She also elevated her head of bed says that has made "a world of difference".  She is also avoiding food within several hours of bed.  In the past, she would sometimes enjoy late evening ice cream, after which she would have severe regurgitation that would wake her from sleep.  ROS: Cardiovascular:  no chest pain Respiratory: no dyspnea No diarrhea  Low back pain Remainder of systems negative except as above The patient's Past Medical, Family and Social History were reviewed and are on file in the EMR.  Objective:  Med list reviewed  Current Outpatient Medications:  .  Ascorbic Acid (VITAMIN C) 100 MG tablet, Take 100 mg by mouth every other day. , Disp: , Rfl:  .  B Complex-Biotin-FA (VITAMIN B50 COMPLEX PO), Take 1 tablet by mouth every other day.,  Disp: , Rfl:  .  Cholecalciferol (VITAMIN D-3) 1000 units CAPS, Take 1,000 Units by mouth daily., Disp: , Rfl:  .  clopidogrel (PLAVIX) 75 MG tablet, Take 75 mg by mouth daily., Disp: , Rfl:  .  Coenzyme Q10 (COQ10 PO), Take by mouth., Disp: , Rfl:  .  Cyanocobalamin (B-12) 250 MCG TABS, Take 250 mcg by mouth every other day. , Disp: , Rfl:  .  hydrochlorothiazide (MICROZIDE) 12.5 MG capsule, Take 12.5 mg by mouth as needed., Disp: , Rfl:  .  irbesartan (AVAPRO) 150 MG tablet, Take 150 mg by mouth daily. , Disp: , Rfl:  .  levothyroxine (SYNTHROID, LEVOTHROID) 125 MCG tablet, Take 1 tablet by mouth daily., Disp: , Rfl:  .  Omega-3 Fatty Acids (FISH OIL PO), Take 1,000 mg by mouth., Disp: , Rfl:  .  omeprazole (PRILOSEC) 40 MG capsule, Take 40 mg by mouth 2 (two) times a day., Disp: , Rfl:  .  Potassium 99 MG TABS, Take by mouth., Disp: , Rfl:  .  atorvastatin (LIPITOR) 20 MG tablet, Take 1 tablet (20 mg total) by mouth daily at 6 PM., Disp: 30 tablet, Rfl: 0   Vital signs in last 24 hrs: Vitals:   03/02/19 0932  BP: 136/60  Pulse: 92  Temp: 97.7 F (36.5 C)    Physical Exam  She is well-appearing  HEENT: sclera anicteric, oral mucosa moist without lesions  Neck: supple, no thyromegaly, JVD or lymphadenopathy  Cardiac: RRR without murmurs, S1S2  heard, no peripheral edema  Pulm: clear to auscultation bilaterally, normal RR and effort noted  Abdomen: soft, no tenderness, with active bowel sounds. No guarding or palpable hepatosplenomegaly.  Skin; warm and dry, no jaundice or rash  Data:  Duodenal bulb biopsy shows peptic changes Biopsy from second portion duodenum shows mild changes of celiac.  @ASSESSMENTPLANBEGIN @ Assessment: Encounter Diagnoses  Name Primary?  . GERD with esophagitis Yes  . Hiatal hernia   . Celiac sprue    Despite biopsy changes of celiac, she is asymptomatic on a gluten-free diet. Severe reflux esophagitis was found, symptoms considerably  improved on medicines and diet/lifestyle changes. I showed her diagram of the anatomy, described hiatal hernia, reviewed therapies for reflux.  Since she is considerably improved, I am hopeful she may be able to avoid surgical therapy for the hernia and reflux. Plan: Decrease PPI to pantoprazole 40 mg with evening meal, and plan to continue it indefinitely.  We will confirm with her pharmacy and be sure instructions are clear on her AVS today.  Continue diet and lifestyle measures, as there are at least if not more important in the medicine to control the symptoms.  Repeat upper endoscopy in 4 to 6 weeks to see if esophagitis remains healed after PPI dose reduction.  Today's note will be copied to primary care for continuity and also to suggest she have a bone density study if it has not been done in the last 2 or 3 years.  She has a history of celiac sprue will be on long-term PPI therapy.   Total time 28 minutes, over half spent face-to-face with patient in counseling and coordination of care.   Charlie Pitter III  CC: Guerry Bruin, MD

## 2019-03-02 NOTE — Patient Instructions (Addendum)
If you are age 81 or older, your body mass index should be between 23-30. Your Body mass index is 28.34 kg/m. If this is out of the aforementioned range listed, please consider follow up with your Primary Care Provider.  If you are age 74 or younger, your body mass index should be between 19-25. Your Body mass index is 28.34 kg/m. If this is out of the aformentioned range listed, please consider follow up with your Primary Care Provider.   You have been scheduled for an endoscopy. Please follow written instructions given to you at your visit today. If you use inhalers (even only as needed), please bring them with you on the day of your procedure. Your physician has requested that you go to www.startemmi.com and enter the access code given to you at your visit today. This web site gives a general overview about your procedure. However, you should still follow specific instructions given to you by our office regarding your preparation for the procedure.  PLEASE DON'T TAKE ANY MORE OMEPRAZOLE.  CONTINUE THE PANTOPRAZOLE 40 MG, ONE WITH SUPPER DAILY!!!!   You will be contacted by our office prior to your procedure for directions on holding your Plavix.  If you do not hear from our office 1 week prior to your scheduled procedure, please call [phone] to discuss.   It was a pleasure to see you today!  Dr. Myrtie Neither

## 2019-03-07 ENCOUNTER — Telehealth: Payer: Self-pay

## 2019-03-07 NOTE — Telephone Encounter (Signed)
[facility] Pre-operative Risk Assessment     Request for surgical clearance:     Endoscopy Procedure  What type of surgery is being performed?     EGD  When is this surgery scheduled?     03-29-2019  What type of clearance is required ?   Pharmacy  Are there any medications that need to be held prior to surgery and how long? Yes, Plavix, 5 days  Practice name and name of physician performing surgery?      [facility]  What is your office phone and fax number? Phone- [phone]  [phone]  Anesthesia type (None, local, MAC, general) ? MAC

## 2019-03-07 NOTE — Telephone Encounter (Signed)
Please provide clearance letter.  Patient had stroke in 02/2018 with good clinical recovery.  She has been stable since that time without recurrent stroke/TIA symptoms.  At this time, she is cleared to undergo endoscopy procedure with holding Plavix 5 days prior and recommend restarting immediately after once cleared by GI.  She has had a small but acceptable periprocedural risk of recurrent stroke while off antiplatelet.

## 2019-03-08 NOTE — Telephone Encounter (Signed)
Clearance form fax to [phone] twice and confirmed.

## 2019-03-08 NOTE — Telephone Encounter (Signed)
Clearance letter done waiting for Shanda Bumps NP signature.

## 2019-03-09 ENCOUNTER — Telehealth: Payer: Self-pay

## 2019-03-09 NOTE — Telephone Encounter (Signed)
Pt. Has been notified and aware. She states clear understanding to hold 5 days prior.

## 2019-03-09 NOTE — Telephone Encounter (Signed)
Patient call with complaints of GERD. We stopped her omeprazole due to being on Plavix. She started Protonix 40 mg with evening meals. She states its just not working. She has had 2 episodes of vomiting after eating. Pt requests a medication alternative. Please advise.

## 2019-03-12 NOTE — Telephone Encounter (Signed)
Sorry to hear she is having trouble.  I'm not sure it is a failure of the omeprazole, since some regurgitation episodes will happen on any such medicine, considering the large hiatal hernia she has.  Must be sure to stay upright for at least an hour after meals, and not eat within several hours of bedtime, while also elevating head of bed as discussed at visit.  If very attentive to that, but still episodes occurring, then go back up to twice daily on the omeprazole (40 mg breakfast and supper).  Will be glad to discuss it further with her when she comes for EGD in a couple weeks.

## 2019-03-12 NOTE — Telephone Encounter (Signed)
Pt understands, she will continue the Protonix for now and see how it goes until the EGD scheduled for 03-29-2019. She has been following the guidelines for not eating late, and sitting upright after meals. She has also raised the head of the bed.

## 2019-03-28 ENCOUNTER — Telehealth: Payer: Self-pay | Admitting: Gastroenterology

## 2019-03-28 NOTE — Telephone Encounter (Signed)

## 2019-03-29 ENCOUNTER — Ambulatory Visit (AMBULATORY_SURGERY_CENTER): Payer: Medicare Other | Admitting: Gastroenterology

## 2019-03-29 ENCOUNTER — Other Ambulatory Visit: Payer: Self-pay

## 2019-03-29 ENCOUNTER — Encounter: Payer: Self-pay | Admitting: Gastroenterology

## 2019-03-29 VITALS — BP 168/72 | HR 75 | Temp 98.2°F | Resp 19 | Ht 61.0 in | Wt 150.0 lb

## 2019-03-29 DIAGNOSIS — K2100 Gastro-esophageal reflux disease with esophagitis, without bleeding: Secondary | ICD-10-CM

## 2019-03-29 DIAGNOSIS — K208 Other esophagitis: Secondary | ICD-10-CM | POA: Diagnosis not present

## 2019-03-29 DIAGNOSIS — K449 Diaphragmatic hernia without obstruction or gangrene: Secondary | ICD-10-CM

## 2019-03-29 MED ORDER — SODIUM CHLORIDE 0.9 % IV SOLN: 500.0000 mL | Freq: Once | INTRAVENOUS | Status: DC

## 2019-03-29 NOTE — Op Note (Signed)
[facility] Patient Name: Caroline PrinceLynn Vittorio Procedure Date: 03/29/2019 9:07 AM MRN: [id number] Endoscopist: Sherilyn CooterHenry L. Myrtie Neitheranis , MD Age: 83 Referring MD:  Date of Birth: Feb 01, 1936 Gender: Female Account #: [id number] Procedure:                Upper GI endoscopy Indications:              Follow-up of reflux esophagitis Medicines:                Monitored Anesthesia Care Procedure:                Pre-Anesthesia Assessment:                           - Prior to the procedure, a History and Physical                            was performed, and patient medications and                            allergies were reviewed. The patient's tolerance of                            previous anesthesia was also reviewed. The risks                            and benefits of the procedure and the sedation                            options and risks were discussed with the patient.                            All questions were answered, and informed consent                            was obtained. Prior Anticoagulants: The patient has                            taken Plavix (clopidogrel), last dose was 5 days                            prior to procedure. ASA Grade Assessment: III - A                            patient with severe systemic disease. After                            reviewing the risks and benefits, the patient was                            deemed in satisfactory condition to undergo the                            procedure.  After obtaining informed consent, the endoscope was                            passed under direct vision. Throughout the                            procedure, the patient's blood pressure, pulse, and                            oxygen saturations were monitored continuously. The                            Model GIF-HQ190 [id number]) scope was introduced                            through the mouth, and advanced to the second part                     of duodenum. The upper GI endoscopy was                            accomplished without difficulty. The patient                            tolerated the procedure well. Scope In: Scope Out: Findings:                 LA Grade D (one or more mucosal breaks involving at                            least 75% of esophageal circumference) esophagitis                            with no bleeding was found in the lower third of                            the esophagus.                           One tongue of salmon-colored mucosa was present at                            39 cm. No other visible abnormalities were present.                            The maximum longitudinal extent of these esophageal                            mucosal changes was 1 cm in length. Biopsies were                            taken with a cold forceps for histology.                           A 4 cm hiatal hernia  was present.                           The stomach was normal.                           The cardia and gastric fundus were normal on                            retroflexion.                           Diffuse atrophic mucosa was found in the entire                            duodenum (previously biopsied - known celiac). Complications:            No immediate complications. Estimated Blood Loss:     Estimated blood loss was minimal. Impression:               - LA Grade D reflux esophagitis.                           - Salmon-colored mucosa suspicious for                            short-segment Barrett's esophagus. Biopsied.                           - 4 cm hiatal hernia.                           - Normal stomach.                           - Duodenal mucosal atrophy.                           Esophagitis not improved since last EGD on high                            dose acid suppression and diet/lifestyle                            anti-reflux changes. Persistent symptoms as well,                             particularly episodic severe regurgitation. Recommendation:           - Patient has a contact number available for                            emergencies. The signs and symptoms of potential                            delayed complications were discussed with the  patient. Return to normal activities tomorrow.                            Written discharge instructions were provided to the                            patient.                           - Resume previous diet.                           - Continue present medications.                           - Resume Plavix (clopidogrel) at prior dose                            tomorrow.                           - Await pathology results.                           - discussion re: evaluation for surgical hernia                            repair Mallie Mussel L. Loletha Carrow, MD 03/29/2019 9:34:03 AM This report has been signed electronically.

## 2019-03-29 NOTE — Progress Notes (Signed)
Per Dr. Loletha Carrow he wants the office to set up a telephone visit to discuss evaluation for surgical hernia repair.  That way Dr. Loletha Carrow and talk with pt and her husband together about the findings.  The office will call pt to set up this telephone appointment.   No problems noted in the recovery room.

## 2019-03-29 NOTE — Progress Notes (Signed)
Caroline [name]- temp [name]- vitals 

## 2019-03-29 NOTE — Progress Notes (Signed)
Pt's states no medical or surgical changes since previsit or office visit. 

## 2019-03-29 NOTE — Progress Notes (Signed)
A and O x3. Report to RN. Tolerated MAC anesthesia well.Teeth unchanged after procedure.

## 2019-03-29 NOTE — Progress Notes (Signed)
Called to room to assist during endoscopic procedure.  Patient ID and intended procedure confirmed with present staff. Received instructions for my participation in the procedure from the performing physician.  

## 2019-03-29 NOTE — Patient Instructions (Addendum)
YOU HAD AN ENDOSCOPIC PROCEDURE TODAY AT [facility]:   Refer to the procedure report that was given to you for any specific questions about what was found during the examination.  If the procedure report does not answer your questions, please call your gastroenterologist to clarify.  If you requested that your care partner not be given the details of your procedure findings, then the procedure report has been included in a sealed envelope for you to review at your convenience later.  YOU SHOULD EXPECT: Some feelings of bloating in the abdomen. Passage of more gas than usual.  Walking can help get rid of the air that was put into your GI tract during the procedure and reduce the bloating. If you had a lower endoscopy (such as a colonoscopy or flexible sigmoidoscopy) you may notice spotting of blood in your stool or on the toilet paper. If you underwent a bowel prep for your procedure, you may not have a normal bowel movement for a few days.  Please Note:  You might notice some irritation and congestion in your nose or some drainage.  This is from the oxygen used during your procedure.  There is no need for concern and it should clear up in a day or so.  SYMPTOMS TO REPORT IMMEDIATELY:    Following upper endoscopy (EGD)  Vomiting of blood or coffee ground material  New chest pain or pain under the shoulder blades  Painful or persistently difficult swallowing  New shortness of breath  Fever of 100F or higher  Black, tarry-looking stools  For urgent or emergent issues, a gastroenterologist can be reached at any hour by calling [phone].   DIET:  We do recommend a small meal at first, but then you may proceed to your regular diet.  Drink plenty of fluids but you should avoid alcoholic beverages for 24 hours.  ACTIVITY:  You should plan to take it easy for the rest of today and you should NOT DRIVE or use heavy machinery until tomorrow (because of the sedation medicines used  during the test).    FOLLOW UP: Our staff will call the number listed on your records 48-72 hours following your procedure to check on you and address any questions or concerns that you may have regarding the information given to you following your procedure. If we do not reach you, we will leave a message.  We will attempt to reach you two times.  During this call, we will ask if you have developed any symptoms of COVID 19. If you develop any symptoms (ie: fever, flu-like symptoms, shortness of breath, cough etc.) before then, please call [phone].  If you test positive for Covid 19 in the 2 weeks post procedure, please call and report this information to [place].    If any biopsies were taken you will be contacted by phone or by letter within the next 1-3 weeks.  Please call us at [phone] if you have not heard about the biopsies in 3 weeks.    SIGNATURES/CONFIDENTIALITY: You and/or your care partner have signed paperwork which will be entered into your electronic medical record.  These signatures attest to the fact that that the information above on your After Visit Summary has been reviewed and is understood.  Full responsibility of the confidentiality of this discharge information lies with you and/or your care-partner.    Handouts were given to you on GERD and a hiatal hernia. Resume your PLAVIX at prior dose tomorrow. You  may resume your other current medications today. Await biopsy results. Discussion for evaluation for surgical hernia repair.  The office will call you to schedule a telephone visit with Dr. Myrtie Neitheranis to discuss surgical hernia repair with you, without sedation on board and with your husband also. Please call if any questions or concerns.

## 2019-03-30 ENCOUNTER — Telehealth: Payer: Self-pay | Admitting: Gastroenterology

## 2019-03-30 NOTE — Telephone Encounter (Signed)
Left message to return call 

## 2019-03-30 NOTE — Telephone Encounter (Signed)
Pt would like to know "if Dr. Loletha Carrow would like me to take those pills for nausea before or after meals."

## 2019-04-02 ENCOUNTER — Encounter: Payer: Self-pay | Admitting: Gastroenterology

## 2019-04-02 ENCOUNTER — Other Ambulatory Visit: Payer: Self-pay | Admitting: *Deleted

## 2019-04-02 ENCOUNTER — Telehealth: Payer: Self-pay | Admitting: *Deleted

## 2019-04-02 NOTE — Telephone Encounter (Signed)
Pt called with an update this morning to let you know she has been doing well. She is taking the Protonix and OTC gas relief and has no problem or complaints. Advised to call if she had any other symptoms

## 2019-04-02 NOTE — Telephone Encounter (Signed)
Spoke to [place], she's doing great with treatment of Protonix and OTC gas relief medications. Advised to call if she develops any problems or complaints.

## 2019-04-02 NOTE — Telephone Encounter (Signed)
Caroline [name].  I did not make any changes to medicines Caroline [name] sometimes has difficulty with memory). My apologies I was not at liberty to call him between procedures Friday.  Told her we need to discuss her GERD treatment, and that I would have office contact them to arrange a phone visit with me. I am out of the office all week while on hospital consult service.    Caroline [name],  My regular schedule is full for weeks.  Please add-on  1pm phone visit Wed July 8th.  No change in therapy at this time.   Thank you both.  - HD

## 2019-04-02 NOTE — Telephone Encounter (Signed)
  Follow up Call-  Call back number 03/29/2019 11/29/2018  Post procedure Call Back phone  # [phone]  Permission to leave phone message Yes Yes  Some recent data might be hidden     Patient questions:  Do you have a fever, pain , or abdominal swelling? No. Pain Score  0 *  Have you tolerated food without any problems? Yes.    Have you been able to return to your normal activities? Yes.    Do you have any questions about your discharge instructions: Diet   No. Medications  Yes.   Follow up visit  No.  Do you have questions or concerns about your Care? Yes.       Dr Loletha Carrow - when  I called for f/u husband was abrupt with me saying patient  left [facility] confused about which medication she is supposed to take as you advised her  to stop one because it made her sick.  They couldn't tell me which meds they were referring to (as  I woke them up).  They will be calling me back later today.  Hopefully you can shed some light on this.  Thanks!  Actions: * If pain score is 4 or above: No action needed, pain <4. 1.  Have you developed a fever since your procedure? no  2.   Have you had an respiratory symptoms (SOB or cough) since your procedure? no  3.   Have you tested positive for COVID 19 since your procedure no  4.   Have you had any family members/close contacts diagnosed with the COVID 19 since your procedure?  no   If yes to any of these questions please route to Joylene John, RN and Alphonsa Gin, Therapist, sports.

## 2019-04-04 ENCOUNTER — Encounter: Payer: Self-pay | Admitting: *Deleted

## 2019-04-26 ENCOUNTER — Ambulatory Visit (INDEPENDENT_AMBULATORY_CARE_PROVIDER_SITE_OTHER): Payer: Medicare Other | Admitting: Gastroenterology

## 2019-04-26 ENCOUNTER — Encounter: Payer: Self-pay | Admitting: Gastroenterology

## 2019-04-26 VITALS — Ht 60.0 in | Wt 137.0 lb

## 2019-04-26 DIAGNOSIS — K2100 Gastro-esophageal reflux disease with esophagitis, without bleeding: Secondary | ICD-10-CM

## 2019-04-26 DIAGNOSIS — K449 Diaphragmatic hernia without obstruction or gangrene: Secondary | ICD-10-CM | POA: Diagnosis not present

## 2019-04-26 MED ORDER — PANTOPRAZOLE SODIUM 40 MG PO TBEC: 40.0000 mg | DELAYED_RELEASE_TABLET | Freq: Two times a day (BID) | ORAL | 1 refills | Status: DC

## 2019-04-26 NOTE — Progress Notes (Signed)
This patient contacted our office requesting a physician telemedicine consultation regarding clinical questions and/or test results. Interactive audio and video telecommunications were attempted between this provider and the patient.  However, this technology failed due to the patient having technical difficulties OR they did not have access to video capabilities.  We continued and completed the visit with audio only.  Due to COVID restrictions, this was felt to be the most appropriate method of patient evaluation.   Participants on the conference : myself and patient and Caroline [name]  The patient consented to this consultation and was aware that a charge will be placed through their insurance.  They were also made aware of the limitations of telemedicine.  I was in my office and the patient was at home.   Total time 21 minutes, 17  spent on phone discussion.   Current Outpatient Medications:  .  Ascorbic Acid (VITAMIN C) 100 MG tablet, Take 100 mg by mouth every other day. , Disp: , Rfl:  .  B Complex-Biotin-FA (VITAMIN B50 COMPLEX PO), Take 1 tablet by mouth every other day., Disp: , Rfl:  .  Cholecalciferol (VITAMIN D-3) 1000 units CAPS, Take 1,000 Units by mouth daily., Disp: , Rfl:  .  clopidogrel (PLAVIX) 75 MG tablet, Take 75 mg by mouth daily., Disp: , Rfl:  .  Coenzyme Q10 (COQ10 PO), Take by mouth., Disp: , Rfl:  .  Cyanocobalamin (B-12) 250 MCG TABS, Take 250 mcg by mouth every other day. , Disp: , Rfl:  .  hydrochlorothiazide (MICROZIDE) 12.5 MG capsule, Take 12.5 mg by mouth as needed., Disp: , Rfl:  .  irbesartan (AVAPRO) 150 MG tablet, Take 150 mg by mouth daily. , Disp: , Rfl:  .  levothyroxine (SYNTHROID, LEVOTHROID) 125 MCG tablet, Take 1 tablet by mouth daily., Disp: , Rfl:  .  Omega-3 Fatty Acids (FISH OIL PO), Take 1,000 mg by mouth., Disp: , Rfl:  .  omeprazole (PRILOSEC) 40 MG capsule, Take 40 mg by mouth 2 (two) times a day., Disp: , Rfl:  .  Potassium 99 MG TABS,  Take by mouth., Disp: , Rfl:  .  atorvastatin (LIPITOR) 20 MG tablet, Take 1 tablet (20 mg total) by mouth daily at 6 PM., Disp: 30 tablet, Rfl: 0   _____________________________________________________________________________________________   Caroline [name] followed up with me regarding Caroline reflux esophagitis and hiatal hernia.  She has severe regurgitation, especially at night.  Upper endoscopy a month ago showed no improvement from several months prior with Caroline on high-dose twice daily PPI as well as diet and lifestyle changes including elevating head of bed.  I think she still occasionally eats in the late evening.  She was originally on omeprazole, but it was changed to pantoprazole on the recommendation of Caroline pharmacist because of potential interaction with clopidogrel.  She had some confusion over Caroline medications which when she should be taking, the staff educated Caroline about this on several occasions. She has felt the medicine has not been working properly, because she still has regurgitation after meals and with bending over.  We have spoken before about the ability of medicine to often control heartburn, but it does not prevent regurgitation.   It seems she has some difficulty with Caroline memory, and that is also why she originally wanted Caroline [name] to be part of today's discussion.However, he was working out of state today and could not be part of the discussion.  (A tongue of salmon-colored distal esophageal mucosa was biopsied on most  recent EGD, no Barrett's was seen.)  Caroline [name] has been feeling well lately, better than she has in months.  Other than some dietary indiscretions when family were recently visiting, she has had infrequent episodes of regurgitation, and still does not get heartburn.  She is trying to eat smaller portions, does not eat within several hours of bed, I also recommended staying upright, and even perhaps walking around, for at least 30 minutes after meals.  On detailed questioning  of Caroline meds, it turns out that at least sometimes, she has been taking ondansetron twice daily instead of pantoprazole.  So we reviewed Caroline meds again, especially the need for pantoprazole twice daily.   It now seems that perhaps some confusion over Caroline medicines may have been at least partially responsible for the lack of improvement in the esophagitis on repeat endoscopy.  Caroline [name] feels that Caroline symptoms are doing well these days, and she does not want to consider surgical treatment of the reflux.  I encouraged Caroline to continue diet and lifestyle changes as well as pantoprazole 40 mg twice daily.  A new prescription was sent for this.    Caroline Lund, MD    [facility]  She was also encouraged to contact me as the need arises.

## 2019-06-27 ENCOUNTER — Telehealth: Payer: Self-pay | Admitting: Gastroenterology

## 2019-06-27 ENCOUNTER — Other Ambulatory Visit: Payer: Self-pay | Admitting: *Deleted

## 2019-06-27 DIAGNOSIS — R112 Nausea with vomiting, unspecified: Secondary | ICD-10-CM

## 2019-06-27 NOTE — Telephone Encounter (Signed)
Given "nausea pills" after stroke 2 years ago. Unable to remember what the prescription was. Vomited in car 2 days ago and would like something for nausea. Persistent daily nausea x 1 month. This nausea has lead to several episodes of vomiting. The N/V is affecting her daily living, she is afraid to leave the house because the N/V suddenly appears and she vomits no matter where she is. She is scheduled with [facility] on 9/30 at 11:00 am. Please advise.

## 2019-06-27 NOTE — Telephone Encounter (Signed)
Spoke to the patient, patient desired to be scheduled for GES after returned from visiting out of town. Patient scheduled for GES on Tuesday, July 24, 2019 with an arrival time of 8:30 am at [facility]. She is to be NPO for 6 hours prior and cannot have any stomach medications for 6 hours prior to her GES. Will call the patient on 9/24 to confirm.

## 2019-06-27 NOTE — Telephone Encounter (Signed)
Caroline [name], I will prescribe her some zofran to take sparingly because it can cause constipation.  Still seems to me much of this is related to the hiatal hernia, for which she has not wished to seek surgical consultation.  Clinic visit visit with APP next week as scheduled.  Let's also make sure her stomach empties normally - arrange a gastric emptying scan.  [name] - patient of mine you will see in clinic next week.  Has severe regurgitation and reflux esophagitis.  - HD

## 2019-06-28 ENCOUNTER — Telehealth: Payer: Self-pay | Admitting: *Deleted

## 2019-06-28 MED ORDER — ONDANSETRON HCL 4 MG PO TABS: 4.0000 mg | ORAL_TABLET | Freq: Three times a day (TID) | ORAL | 0 refills | Status: DC | PRN

## 2019-06-28 NOTE — Telephone Encounter (Signed)
done

## 2019-06-28 NOTE — Telephone Encounter (Signed)
Spoke with the patient who confirmed the GES date and time. She was also notified that the script for Zofran was sent to the pharmacy.

## 2019-06-28 NOTE — Telephone Encounter (Signed)
Dr. Loletha Carrow, please advise concerning Zofran prescription. Thank you.

## 2019-07-04 ENCOUNTER — Encounter: Payer: Self-pay | Admitting: Gastroenterology

## 2019-07-04 ENCOUNTER — Ambulatory Visit (INDEPENDENT_AMBULATORY_CARE_PROVIDER_SITE_OTHER): Payer: Medicare Other | Admitting: Gastroenterology

## 2019-07-04 VITALS — BP 142/68 | HR 84 | Temp 97.2°F | Ht 60.0 in | Wt 145.2 lb

## 2019-07-04 DIAGNOSIS — K219 Gastro-esophageal reflux disease without esophagitis: Secondary | ICD-10-CM | POA: Diagnosis not present

## 2019-07-04 DIAGNOSIS — R1110 Vomiting, unspecified: Secondary | ICD-10-CM | POA: Diagnosis not present

## 2019-07-04 DIAGNOSIS — K209 Esophagitis, unspecified without bleeding: Secondary | ICD-10-CM

## 2019-07-04 DIAGNOSIS — K449 Diaphragmatic hernia without obstruction or gangrene: Secondary | ICD-10-CM | POA: Diagnosis not present

## 2019-07-04 NOTE — Patient Instructions (Signed)
If you are age 81 or older, your body mass index should be between 23-30. Your Body mass index is 28.36 kg/m. If this is out of the aforementioned range listed, please consider follow up with your Primary Care Provider.  If you are age 66 or younger, your body mass index should be between 19-25. Your Body mass index is 28.36 kg/m. If this is out of the aformentioned range listed, please consider follow up with your Primary Care Provider.   Discussed small frequent meals.  Will await results of Gastric Emptying Scan.  Thank you for choosing me and [facility].   Alonza Bogus, PA-C   Gastric Emptying Scan instructions:  Please make certain not to have anything to eat or drink after midnight the night before your test. Hold all stomach medications (ex: Zofran, phenergan, Reglan) 48 hours prior to your test. If you need to reschedule your appointment, please contact radiology scheduling at [phone]. _____________________________________________________________________ A gastric-emptying study measures how long it takes for food to move through your stomach. There are several ways to measure stomach emptying. In the most common test, you eat food that contains a small amount of radioactive material. A scanner that detects the movement of the radioactive material is placed over your abdomen to monitor the rate at which food leaves your stomach. This test normally takes about 4 hours to complete. _____________________________________________________________________

## 2019-07-04 NOTE — Progress Notes (Signed)
____________________________________________________________  Attending physician addendum:  Thank you for sending this case to me. I have reviewed the entire note, and the outlined plan seems appropriate.  Thank you for seeing her.  I still feel these episodes that she describes as vomiting are actually severe regurgitation due to her large hiatal hernia.  She has some memory difficulty, and does not seem to recall that we have had that discussion a few times.  With a recent phone call, I then ordered a gastric emptying study to be sure that is not playing a role here.  Wilfrid Lund, MD  ____________________________________________________________

## 2019-07-04 NOTE — Progress Notes (Signed)
07/04/2019 Caroline [name] [id number] 11/10/1935   HISTORY OF PRESENT ILLNESS:  This is an 83 year old female who is a patient of Dr. Loletha Carrow.  She is here today with complaints of intermittent episodes of vomiting.  She does have a 4 cm hiatal hernia and had grade D esophagitis related to that.  He has discussed possible surgical repair with her but due to her age she has declined for now.  Is on pantoprazole 40 mg twice daily.  Has Zofran to use as needed.  Says that these episodes come on suddenly ad se can feel the saliva building up in her mouth before she vomits.  Once she vomits she feels great.  Says that this occurs about once a week or so.  She seems to follow antireflux dietary measures well from what she tells me.  She is scheduled for gastric emptying scan later this month   Past Medical History:  Diagnosis Date  . Allergy   . Anxiety   . Arthritis   . Cataract   . Celiac sprue   . CVA (cerebral vascular accident) ([place]) 02/2018   R thalamic CVA  . DVT (deep vein thrombosis) in pregnancy   . GERD (gastroesophageal reflux disease)   . Glaucoma   . Hormone replacement therapy (HRT)   . Hypercholesteremia   . Hyperlipidemia   . Hypertension   . Hypoglycemia   . Hypothyroidism   . OAB (overactive bladder)   . Osteoporosis   . Raynaud's disease   . Scoliosis   . Seasonal allergies   . Senile purpura ([place])   . 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   Bridge Left Nose, and Left Neck  . 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   Bridge of nose tx Cx3 and 5FU, Left neck Tx Cx3 and 5FU  . Thrombophlebitis    Past Surgical History:  Procedure Laterality Date  . ABDOMINAL HYSTERECTOMY  1979  . CATARACT EXTRACTION, BILATERAL    . knee surgery Left   . VARICOSE VEIN SURGERY Left     reports  that she has never smoked. She has never used smokeless tobacco. She reports previous drug use. She reports that she does not drink alcohol. family history includes Arthritis in her mother; Cancer in her maternal uncle; Hypertension in her mother; Obesity in her sister. Allergies  Allergen Reactions  . Gluten Meal Nausea And Vomiting    Patient has CELIAC DISEASE  . Wheat Bran Nausea And Vomiting    Patient has CELIAC DISEASE  . Adhesive [Tape] Other (See Comments)    Patient's skin is VERY THIN and tears very easily; please use either paper tape or Coban wrap  . Codeine Nausea And Vomiting  . Other Other (See Comments)    Patient has Hypoglycemia; her husband stated "when her sugar crashes, it crashes hard."      Outpatient Encounter Medications as of 07/04/2019  Medication Sig  . Ascorbic Acid (VITAMIN C) 100 MG tablet Take 100 mg by mouth every other day.   Marland Kitchen atorvastatin (LIPITOR) 20 MG tablet Take 1 tablet (20 mg total) by mouth daily at 6 PM.  . B Complex-Biotin-FA (VITAMIN B50 COMPLEX PO) Take 1 tablet by mouth every other day.  . Cholecalciferol (VITAMIN D-3) 1000 units CAPS Take  1,000 Units by mouth daily.  . clopidogrel (PLAVIX) 75 MG tablet Take 75 mg by mouth daily.  . Coenzyme Q10 (COQ10 PO) Take by mouth.  . Cyanocobalamin (B-12) 250 MCG TABS Take 250 mcg by mouth every other day.   . hydrochlorothiazide (MICROZIDE) 12.5 MG capsule Take 12.5 mg by mouth as needed.  . irbesartan (AVAPRO) 150 MG tablet Take 150 mg by mouth daily.   Marland Kitchen levothyroxine (SYNTHROID, LEVOTHROID) 125 MCG tablet Take 1 tablet by mouth daily.  . Omega-3 Fatty Acids (FISH OIL PO) Take 1,000 mg by mouth.  Marland Kitchen omeprazole (PRILOSEC) 40 MG capsule Take 40 mg by mouth 2 (two) times a day.  . ondansetron (ZOFRAN) 4 MG tablet Take 1 tablet (4 mg total) by mouth every 8 (eight) hours as needed for nausea or vomiting.  . pantoprazole (PROTONIX) 40 MG tablet Take 1 tablet (40 mg total) by mouth 2 (two) times daily  before a meal.  . Potassium 99 MG TABS Take by mouth.  . simethicone (MYLICON) 125 MG chewable tablet Chew 125 mg by mouth every 6 (six) hours as needed for flatulence.   No facility-administered encounter medications on file as of 07/04/2019.      REVIEW OF SYSTEMS  : All other systems reviewed and negative except where noted in the History of Present Illness.   PHYSICAL EXAM: BP (!) 142/68   Pulse 84   Temp (!) 97.2 F (36.2 C)   Ht 5' (1.524 m)   Wt 145 lb 3.2 oz (65.9 kg)   BMI 28.36 kg/m  General: Well developed white female in no acute distress Head: Normocephalic and atraumatic Eyes:  Sclerae anicteric, conjunctiva pink. Ears: Normal auditory acuity Lungs: Clear throughout to auscultation; no increased WOB. Heart: Regular rate and rhythm; no M/R/G. Abdomen: Soft, non-distended.  BS present.  Non-tender. Musculoskeletal: Symmetrical with no gross deformities  Skin: No lesions on visible extremities Extremities: No edema  Neurological: Alert oriented x 4, grossly non-focal Psychological:  Alert and cooperative. Normal mood and affect  ASSESSMENT AND PLAN: *83 year old female with complaints of intermittent episodes of vomiting.  She does have a 4 cm hiatal hernia and had grade D esophagitis related to that.  Is on pantoprazole 40 mg twice daily.  Has Zofran to use as needed.  She seems to follow antireflux dietary measures well.  We did discuss and talk about eating small frequent meals.  She is scheduled for gastric emptying scan later this month so we will follow-up those results.  I gave her reassurance and told her that otherwise I think she is doing everything that we can do for now unless she would reconsider having surgery for hiatal hernia repair.   CC:  Tisovec, Adelfa Koh, MD

## 2019-07-24 ENCOUNTER — Encounter (HOSPITAL_COMMUNITY)
Admission: RE | Admit: 2019-07-24 | Discharge: 2019-07-24 | Disposition: A | Discharge status: Home / Self Care | Payer: Medicare Other | Source: Ambulatory Visit | Attending: Gastroenterology | Admitting: Gastroenterology

## 2019-07-24 ENCOUNTER — Other Ambulatory Visit: Payer: Self-pay

## 2019-07-24 DIAGNOSIS — R112 Nausea with vomiting, unspecified: Secondary | ICD-10-CM | POA: Diagnosis not present

## 2019-07-24 MED ORDER — TECHNETIUM TC 99M SULFUR COLLOID
2.0600 | Freq: Once | INTRAVENOUS | Status: AC | PRN
  Administered 2019-07-24: 2.06 via ORAL

## 2019-07-25 ENCOUNTER — Telehealth: Payer: Self-pay | Admitting: Gastroenterology

## 2019-07-25 NOTE — Telephone Encounter (Signed)
Pls call pt, she would like to speak with you about a test that she had done yesterday at [facility].

## 2019-07-25 NOTE — Telephone Encounter (Signed)
The wanted to know if she has any further appts with Dr Loletha Carrow.  I confirmed no appts are scheduled at this time.  She will be contacted with results from [place] scan on 10/20 as soon as available The pt has been advised of the information and verbalized understanding.

## 2019-08-01 ENCOUNTER — Ambulatory Visit (INDEPENDENT_AMBULATORY_CARE_PROVIDER_SITE_OTHER): Payer: Medicare Other | Admitting: Urology

## 2019-08-01 ENCOUNTER — Encounter: Payer: Self-pay | Admitting: Urology

## 2019-08-01 ENCOUNTER — Other Ambulatory Visit: Payer: Self-pay

## 2019-08-01 ENCOUNTER — Other Ambulatory Visit: Payer: Self-pay | Admitting: Orthopaedic Surgery

## 2019-08-01 VITALS — BP 133/72 | HR 116 | Ht 60.0 in | Wt 133.0 lb

## 2019-08-01 DIAGNOSIS — R8271 Bacteriuria: Secondary | ICD-10-CM | POA: Diagnosis not present

## 2019-08-01 DIAGNOSIS — N3941 Urge incontinence: Secondary | ICD-10-CM | POA: Diagnosis not present

## 2019-08-01 DIAGNOSIS — M5136 Other intervertebral disc degeneration, lumbar region: Secondary | ICD-10-CM

## 2019-08-01 LAB — BLADDER SCAN AMB NON-IMAGING

## 2019-08-01 NOTE — Progress Notes (Signed)
08/01/2019 11:01 AM   Caroline [name] December 04, 1935 [id number]  Referring provider: Haywood Pao, MD [address] [place],  [place] [address]  Chief Complaint  Patient presents with  . Urinary Incontinence    New Patient    HPI: Extremely pleasant 83 year old female who presents today for second opinion of urinary urgency and urge incontinence.  She has been followed at [facility] urology by Dr. Bjorn Loser for several years.  She is had extensive evaluation and treatments for severe OAB/urge incontinence including trial and failure of multiple medications.  She reports that only one medication was really effective but it caused severe dry mouth.  She believes that she underwent extensive evaluation with either cystoscopy or urodynamics (unclear from her history today, will request records).  She underwent a full course of posterior nerve stimulation about 6 months ago which she had a difficult time tolerating due to toe cramping.  She completed the course without much effect.  She is currently actively monitoring her fluid intake.  She also engages in toilet mapping and is engaging in timed voiding every 2 hours during the daytime.  She will have approximately on average 1-2 episodes of gross urinary incontinence without warning during the daytime for which she wears 2 adult diapers per day.  She does not have much urgency frequency outside of these episodes.  She does have enuresis as well and wears an adult diaper.  She often wakes up knowing she has to use the bathroom but with her history of stroke and unsteady gait, will often just allow herself to "trickle" in her pull-up at night.  She is extremely embarrassed by these episodes.  Her husband also makes her feel badly about her episodes of incontinence.  No gross hematuria or recent UTIs.  She has been told on multiple occasions that she is a "raging" UTI but is asymptomatic.  She denies any dysuria today although her urine is  grossly positive.   PMH: Past Medical History:  Diagnosis Date  . Allergy   . Anxiety   . Arthritis   . Cataract   . Celiac sprue   . CVA (cerebral vascular accident) ([place]) 02/2018   R thalamic CVA  . DVT (deep vein thrombosis) in pregnancy   . GERD (gastroesophageal reflux disease)   . Glaucoma   . Hormone replacement therapy (HRT)   . Hypercholesteremia   . Hyperlipidemia   . Hypertension   . Hypoglycemia   . Hypothyroidism   . OAB (overactive bladder)   . Osteoporosis   . Raynaud's disease   . Scoliosis   . Seasonal allergies   . Senile purpura ([place])   . 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   Bridge Left Nose, and Left Neck  . 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   Bridge of nose tx Cx3 and 5FU, Left neck Tx Cx3 and 5FU  . Thrombophlebitis     Surgical History: Past Surgical History:  Procedure Laterality Date  . ABDOMINAL HYSTERECTOMY  1979  . CATARACT EXTRACTION, BILATERAL    . knee surgery Left   . VARICOSE VEIN SURGERY Left     Home Medications:  Allergies as of 08/01/2019      Reactions   Gluten Meal Nausea And Vomiting   Patient has CELIAC DISEASE  Wheat Bran Nausea And Vomiting   Patient has CELIAC DISEASE   Adhesive [tape] Other (See Comments)   Patient's skin is VERY THIN and tears very easily; please use either paper tape or Coban wrap   Codeine Nausea And Vomiting   Other Other (See Comments)   Patient has Hypoglycemia; her husband stated "when her sugar crashes, it crashes hard."      Medication List       Accurate as of August 01, 2019 11:01 AM. If you have any questions, ask your nurse or doctor.        STOP taking these medications   atorvastatin 20 MG tablet Commonly known as: LIPITOR Stopped by: Vanna Scotland, MD     TAKE these  medications   B-12 250 MCG Tabs Take 250 mcg by mouth every other day.   clopidogrel 75 MG tablet Commonly known as: PLAVIX Take 75 mg by mouth daily.   COQ10 PO Take by mouth.   FISH OIL PO Take 1,000 mg by mouth.   hydrochlorothiazide 12.5 MG capsule Commonly known as: MICROZIDE Take 12.5 mg by mouth as needed.   irbesartan 150 MG tablet Commonly known as: AVAPRO Take 150 mg by mouth daily.   levothyroxine 125 MCG tablet Commonly known as: SYNTHROID Take 1 tablet by mouth daily.   omeprazole 40 MG capsule Commonly known as: PRILOSEC Take 40 mg by mouth 2 (two) times a day.   ondansetron 4 MG tablet Commonly known as: ZOFRAN Take 1 tablet (4 mg total) by mouth every 8 (eight) hours as needed for nausea or vomiting.   pantoprazole 40 MG tablet Commonly known as: PROTONIX Take 1 tablet (40 mg total) by mouth 2 (two) times daily before a meal.   Potassium 99 MG Tabs Take by mouth.   simethicone 125 MG chewable tablet Commonly known as: MYLICON Chew 125 mg by mouth every 6 (six) hours as needed for flatulence.   VITAMIN B50 COMPLEX PO Take 1 tablet by mouth every other day.   vitamin C 100 MG tablet Take 100 mg by mouth every other day.   Vitamin D-3 25 MCG (1000 UT) Caps Take 1,000 Units by mouth daily.       Allergies:  Allergies  Allergen Reactions  . Gluten Meal Nausea And Vomiting    Patient has CELIAC DISEASE  . Wheat Bran Nausea And Vomiting    Patient has CELIAC DISEASE  . Adhesive [Tape] Other (See Comments)    Patient's skin is VERY THIN and tears very easily; please use either paper tape or Coban wrap  . Codeine Nausea And Vomiting  . Other Other (See Comments)    Patient has Hypoglycemia; her husband stated "when her sugar crashes, it crashes hard."    Family History: Family History  Problem Relation Age of Onset  . Arthritis Mother        pt denies  . Hypertension Mother        pt denies  . Obesity Sister   . Cancer Maternal  Uncle        type unknown  . Colon cancer Neg Hx   . Stomach cancer Neg Hx   . Esophageal cancer Neg Hx   . Pancreatic cancer Neg Hx     Social History:  reports that she has never smoked. She has never used smokeless tobacco. She reports previous drug use. She reports that she does not drink alcohol.  ROS: UROLOGY Frequent Urination?: Yes Hard to postpone urination?: Yes Burning/pain  with urination?: No Get up at night to urinate?: No Leakage of urine?: Yes Urine stream starts and stops?: No Trouble starting stream?: No Do you have to strain to urinate?: No Blood in urine?: No Urinary tract infection?: No Sexually transmitted disease?: No Injury to kidneys or bladder?: No Painful intercourse?: No Weak stream?: No Currently pregnant?: No Vaginal bleeding?: No Last menstrual period?: N  Gastrointestinal Nausea?: No Vomiting?: No Indigestion/heartburn?: No Diarrhea?: No Constipation?: No  Constitutional Fever: No Night sweats?: No Weight loss?: No Fatigue?: No  Skin Skin rash/lesions?: No Itching?: No  Eyes Blurred vision?: No Double vision?: No  Ears/Nose/Throat Sore throat?: No Sinus problems?: No  Hematologic/Lymphatic Swollen glands?: No Easy bruising?: No  Cardiovascular Leg swelling?: No Chest pain?: No  Respiratory Cough?: No Shortness of breath?: No  Endocrine Excessive thirst?: No  Musculoskeletal Back pain?: No Joint pain?: No  Neurological Headaches?: No Dizziness?: No  Psychologic Depression?: No Anxiety?: Yes  Physical Exam: BP 133/72   Pulse (!) 116   Ht 5' (1.524 m)   Wt 133 lb (60.3 kg)   BMI 25.97 kg/m   Constitutional:  Alert and oriented, No acute distress. HEENT: [place] AT, moist mucus membranes.  Trachea midline, no masses. Cardiovascular: No clubbing, cyanosis, or edema. Respiratory: Normal respiratory effort, no increased work of breathing. Skin: No rashes, bruises or suspicious lesions. Neurologic: Grossly  intact, no focal deficits, moving all 4 extremities. Psychiatric: Normal mood and affect. MSK: Fairly significant scoliosis/kyphosis appreciated  Laboratory Data: Lab Results  Component Value Date   WBC 6.0 02/10/2018   HGB 12.8 02/10/2018   HCT 39.6 02/10/2018   MCV 92.7 02/10/2018   PLT 283 02/10/2018    Lab Results  Component Value Date   CREATININE 0.79 02/10/2018    Lab Results  Component Value Date   HGBA1C 5.7 (H) 02/08/2018    Urinalysis UA today with greater than 30 white blood cells per high-powered field, no red blood cells, many bacteria nitrate positive.  Pertinent Imaging: Results for orders placed or performed in visit on 08/01/19  Bladder Scan (Post Void Residual) in office  Result Value Ref Range   Scan Result     Assessment & Plan:    1. Urge incontinence Records from [facility]rology requested today  Based on her history, she has failed oral medications and PTNS.  We discussed Botox today at length as that is relatively reasonable last resort for severe refractory OAB type symptoms.  It does sound from her history like she is had extensive work-up to date by excellent urologist.  She was given information on Botox.  We discussed my practice of using this medication including teaching patients to self cath in case they have an episode of urinary retention postoperatively which occurs in approximately 5% of patients.  We discussed other risks including UTI, failure of the medication amongst others.  We discussed overall efficacy rates.  She would like to think about this and will plan to proceed if she like to pursue this.  She will discuss with her husband and get back to [place]. - Urinalysis, Complete - Bladder Scan (Post Void Residual) in office  2. Chronic bacteriuria Patient likely chronically colonized, asymptomatic today other than baseline urge incontinence which I do not suspect is a contributing factor  We discussed that we will hold off  on treating her unless she develops symptoms, agreeable this plan   Return for call if wants to try botox, please sign records release from [facility].  Vanna ScotlandAshley Rosevelt Luu, MD  [facility] [address], [address] [place], [place] [address] [phone]

## 2019-08-02 LAB — URINALYSIS, COMPLETE
Bilirubin, UA: NEGATIVE
Glucose, UA: NEGATIVE
Ketones, UA: NEGATIVE
Nitrite, UA: POSITIVE — AB
Protein,UA: NEGATIVE
Specific Gravity, UA: 1.01 (ref 1.005–1.030)
Urobilinogen, Ur: 0.2 mg/dL (ref 0.2–1.0)
pH, UA: 5.5 (ref 5.0–7.5)

## 2019-08-02 LAB — MICROSCOPIC EXAMINATION
RBC, Urine: NONE SEEN /hpf (ref 0–2)
WBC, UA: >30 /hpf — AB (ref 0–5)

## 2019-08-12 ENCOUNTER — Other Ambulatory Visit: Payer: Self-pay

## 2019-08-12 ENCOUNTER — Ambulatory Visit
Admission: RE | Admit: 2019-08-12 | Discharge: 2019-08-12 | Disposition: A | Discharge status: Home / Self Care | Payer: Medicare Other | Source: Ambulatory Visit | Attending: Orthopaedic Surgery | Admitting: Orthopaedic Surgery

## 2019-08-12 DIAGNOSIS — M5136 Other intervertebral disc degeneration, lumbar region: Secondary | ICD-10-CM

## 2019-10-04 ENCOUNTER — Other Ambulatory Visit: Payer: Self-pay | Admitting: Gastroenterology

## 2019-10-23 ENCOUNTER — Other Ambulatory Visit: Payer: Self-pay | Admitting: Gastroenterology

## 2019-12-26 ENCOUNTER — Telehealth: Payer: Self-pay | Admitting: Gastroenterology

## 2019-12-26 MED ORDER — PANTOPRAZOLE SODIUM 40 MG PO TBEC: DELAYED_RELEASE_TABLET | ORAL | 1 refills | Status: DC

## 2019-12-26 NOTE — Telephone Encounter (Signed)
Refill has been given.

## 2020-01-22 ENCOUNTER — Ambulatory Visit: Payer: Medicare Other | Admitting: Gastroenterology

## 2020-02-05 ENCOUNTER — Ambulatory Visit: Payer: Medicare Other | Admitting: Gastroenterology

## 2020-02-05 ENCOUNTER — Encounter: Payer: Self-pay | Admitting: Gastroenterology

## 2020-02-05 VITALS — BP 132/70 | HR 93 | Temp 98.2°F | Ht 64.0 in | Wt 147.2 lb

## 2020-02-05 DIAGNOSIS — K449 Diaphragmatic hernia without obstruction or gangrene: Secondary | ICD-10-CM

## 2020-02-05 DIAGNOSIS — K2100 Gastro-esophageal reflux disease with esophagitis, without bleeding: Secondary | ICD-10-CM | POA: Diagnosis not present

## 2020-02-05 NOTE — Progress Notes (Signed)
[facility] Progress Note  Chief Complaint: Reflux esophagitis and hiatal hernia  Subjective  History: Caroline [name] has been seen multiple times for episodes that she describes as "vomiting", but appear to be severe reflux based on endoscopic findings in both February and June 2020.  On the second exam, there was little improvement in endoscopic findings on high-dose PPI and antireflux measures.  I have currently recommended she consider surgical repair of the hiatal hernia, but she has been reluctant to do so.  Last clinic visit with one of our PAs September 2020 for same symptoms.  Gastric emptying study shortly afterwards was normal.  Patient was then referred to [facility], but she decided not to see them.  Caroline [name] is much the same as before, with intermittent heartburn and episodes of severe regurgitation that are heralded by "mouth watering". She has learned to avoid certain foods including coffee, and she rarely has alcohol. She mostly wanted to discuss dietary interventions to control GERD. She has tried elevating her head of bed and found some success with diet. She stopped drinking coffee in the morning because she was having multiple regurgitation episodes.  ROS: Cardiovascular:  no chest pain Respiratory: no dyspnea Appetite has been good and weight stable. The patient's Past Medical, Family and Social History were reviewed and are on file in the EMR.  Objective:  Med list reviewed  Current Outpatient Medications:  .  Ascorbic Acid (VITAMIN C) 100 MG tablet, Take 100 mg by mouth every other day. , Disp: , Rfl:  .  B Complex-Biotin-FA (VITAMIN B50 COMPLEX PO), Take 1 tablet by mouth every other day., Disp: , Rfl:  .  Cholecalciferol (VITAMIN D-3) 1000 units CAPS, Take 1,000 Units by mouth daily., Disp: , Rfl:  .  clopidogrel (PLAVIX) 75 MG tablet, Take 75 mg by mouth daily., Disp: , Rfl:  .  Coenzyme Q10 (COQ10 PO), Take by mouth., Disp: , Rfl:  .  Cyanocobalamin (B-12) 250 MCG TABS,  Take 250 mcg by mouth every other day. , Disp: , Rfl:  .  Dextrose-Fructose-Sod Citrate (NAUZENE) [phone] MG CHEW, Chew 1 Chip by mouth as needed., Disp: , Rfl:  .  hydrochlorothiazide (MICROZIDE) 12.5 MG capsule, Take 12.5 mg by mouth as needed., Disp: , Rfl:  .  irbesartan (AVAPRO) 150 MG tablet, Take 150 mg by mouth daily. , Disp: , Rfl:  .  levothyroxine (SYNTHROID, LEVOTHROID) 125 MCG tablet, Take 1 tablet by mouth daily., Disp: , Rfl:  .  Omega-3 Fatty Acids (FISH OIL PO), Take 1,000 mg by mouth., Disp: , Rfl:  .  pantoprazole (PROTONIX) 40 MG tablet, TAKE 1 TABLET BY MOUTH 2 TIMES DAILY BEFORE A MEAL., Disp: 180 tablet, Rfl: 1 .  Potassium 99 MG TABS, Take by mouth., Disp: , Rfl:  .  simethicone (MYLICON) 017 MG chewable tablet, Chew 125 mg by mouth every 6 (six) hours as needed for flatulence., Disp: , Rfl:  .  traMADol (ULTRAM) 50 MG tablet, Take 25 mg by mouth every 6 (six) hours as needed., Disp: , Rfl:    Vital signs in last 24 hrs: Vitals:   02/05/20 1312  BP: 132/70  Pulse: 93  Temp: 98.2 F (36.8 C)  SpO2: 95%    No exam, entire visit spent in conversation.  Labs:   ___________________________________________ Radiologic studies:  CLINICAL DATA:  Nausea and vomiting   EXAM: NUCLEAR MEDICINE GASTRIC EMPTYING SCAN   TECHNIQUE: After oral ingestion of radiolabeled meal, sequential abdominal images were obtained  for 4 hours. Percentage of activity emptying the stomach was calculated at 1 hour, 2 hour, 3 hour, and 4 hours.   RADIOPHARMACEUTICALS:  2.06 mCi Tc-67m sulfur colloid in standardized meal   COMPARISON:  None.   FINDINGS: Expected location of the stomach in the left upper quadrant. Ingested meal empties the stomach gradually over the course of the study.   37% emptied at 1 hr ( normal >= 10%)   75% emptied at 2 hr ( normal >= 40%)   91% emptied at 3 hr ( normal >= 70%)   99% emptied at 4 hr ( normal >= 90%)   IMPRESSION: Normal gastric  emptying study.     Electronically Signed   By: Jasmine Pang M.D.   On: 07/24/2019 14:40  ____________________________________________ Other:   _____________________________________________ Assessment & Plan  Assessment: Encounter Diagnoses  Name Primary?  . Gastroesophageal reflux disease with esophagitis without hemorrhage Yes  . Hiatal hernia    Severe GERD with reflux esophagitis and hiatal hernia. She is concerned about the risks of and recovery from surgery, and would like to manage this problem is long as she can with medicines and diet/lifestyle interventions. I think that is fine to the extent she can live with it, and encouraged her to seek surgical consultation when and if she feels ready to do so.   Plan: Continue twice daily oral PPI, which she needs to help keep the esophagitis from worsening and developing stricture as well as chronic upper airway problems. Written dietary advice was given. We discussed some of it as well, and I also reviewed results of her gastric emptying study. Caroline [name] will see me as needed.  20 minutes were spent on this encounter (including chart review, history/exam, counseling/coordination of care, and documentation)  Charlie Pitter III

## 2020-02-05 NOTE — Patient Instructions (Signed)
If you are age 82 or older, your body mass index should be between 23-30. Your Body mass index is 25.27 kg/m. If this is out of the aforementioned range listed, please consider follow up with your Primary Care Provider.  If you are age 82 or younger, your body mass index should be between 19-25. Your Body mass index is 25.27 kg/m. If this is out of the aformentioned range listed, please consider follow up with your Primary Care Provider.   It was a pleasure to see you today!  Dr. Myrtie Neither      Gastroesophageal Reflux Disease, Adult Gastroesophageal reflux (GER) happens when acid from the stomach flows up into the tube that connects the mouth and the stomach (esophagus). Normally, food travels down the esophagus and stays in the stomach to be digested. With GER, food and stomach acid sometimes move back up into the esophagus. You may have a disease called gastroesophageal reflux disease (GERD) if the reflux:  Happens often.  Causes frequent or very bad symptoms.  Causes problems such as damage to the esophagus. When this happens, the esophagus becomes sore and swollen (inflamed). Over time, GERD can make small holes (ulcers) in the lining of the esophagus. What are the causes? This condition is caused by a problem with the muscle between the esophagus and the stomach. When this muscle is weak or not normal, it does not close properly to keep food and acid from coming back up from the stomach. The muscle can be weak because of:  Tobacco use.  Pregnancy.  Having a certain type of hernia (hiatal hernia).  Alcohol use.  Certain foods and drinks, such as coffee, chocolate, onions, and peppermint. What increases the risk? You are more likely to develop this condition if you:  Are overweight.  Have a disease that affects your connective tissue.  Use NSAID medicines. What are the signs or symptoms? Symptoms of this condition include:  Heartburn.  Difficult or painful  swallowing.  The feeling of having a lump in the throat.  A bitter taste in the mouth.  Bad breath.  Having a lot of saliva.  Having an upset or bloated stomach.  Belching.  Chest pain. Different conditions can cause chest pain. Make sure you see your doctor if you have chest pain.  Shortness of breath or noisy breathing (wheezing).  Ongoing (chronic) cough or a cough at night.  Wearing away of the surface of teeth (tooth enamel).  Weight loss. How is this treated? Treatment will depend on how bad your symptoms are. Your doctor may suggest:  Changes to your diet.  Medicine.  Surgery. Follow these instructions at home: Eating and drinking   Follow a diet as told by your doctor. You may need to avoid foods and drinks such as: ? Coffee and tea (with or without caffeine). ? Drinks that contain alcohol. ? Energy drinks and sports drinks. ? Bubbly (carbonated) drinks or sodas. ? Chocolate and cocoa. ? Peppermint and mint flavorings. ? Garlic and onions. ? Horseradish. ? Spicy and acidic foods. These include peppers, chili powder, curry powder, vinegar, hot sauces, and BBQ sauce. ? Citrus fruit juices and citrus fruits, such as oranges, lemons, and limes. ? Tomato-based foods. These include red sauce, chili, salsa, and pizza with red sauce. ? Fried and fatty foods. These include donuts, french fries, potato chips, and high-fat dressings. ? High-fat meats. These include hot dogs, rib eye steak, sausage, ham, and bacon. ? High-fat dairy items, such as whole milk, butter,  and cream cheese.  Eat small meals often. Avoid eating large meals.  Avoid drinking large amounts of liquid with your meals.  Avoid eating meals during the 2-3 hours before bedtime.  Avoid lying down right after you eat.  Do not exercise right after you eat. Lifestyle   Do not use any products that contain nicotine or tobacco. These include cigarettes, e-cigarettes, and chewing tobacco. If you  need help quitting, ask your doctor.  Try to lower your stress. If you need help doing this, ask your doctor.  If you are overweight, lose an amount of weight that is healthy for you. Ask your doctor about a safe weight loss goal. General instructions  Pay attention to any changes in your symptoms.  Take over-the-counter and prescription medicines only as told by your doctor. Do not take aspirin, ibuprofen, or other NSAIDs unless your doctor says it is okay.  Wear loose clothes. Do not wear anything tight around your waist.  Raise (elevate) the head of your bed about 6 inches (15 cm).  Avoid bending over if this makes your symptoms worse.  Keep all follow-up visits as told by your doctor. This is important. Contact a doctor if:  You have new symptoms.  You lose weight and you do not know why.  You have trouble swallowing or it hurts to swallow.  You have wheezing or a cough that keeps happening.  Your symptoms do not get better with treatment.  You have a hoarse voice. Get help right away if:  You have pain in your arms, neck, jaw, teeth, or back.  You feel sweaty, dizzy, or light-headed.  You have chest pain or shortness of breath.  You throw up (vomit) and your throw-up looks like blood or coffee grounds.  You pass out (faint).  Your poop (stool) is bloody or black.  You cannot swallow, drink, or eat. Summary  If a person has gastroesophageal reflux disease (GERD), food and stomach acid move back up into the esophagus and cause symptoms or problems such as damage to the esophagus.  Treatment will depend on how bad your symptoms are.  Follow a diet as told by your doctor.  Take all medicines only as told by your doctor. This information is not intended to replace advice given to you by your health care provider. Make sure you discuss any questions you have with your health care provider. Document Revised: 03/29/2018 Document Reviewed: 03/29/2018 Elsevier  Patient Education  2020 [employer].    Food Choices for Gastroesophageal Reflux Disease, Adult When you have gastroesophageal reflux disease (GERD), the foods you eat and your eating habits are very important. Choosing the right foods can help ease your discomfort. Think about working with a nutrition specialist (dietitian) to help you make good choices. What are tips for following this plan?  Meals  Choose healthy foods that are low in fat, such as fruits, vegetables, whole grains, low-fat dairy products, and lean meat, fish, and poultry.  Eat small meals often instead of 3 large meals a day. Eat your meals slowly, and in a place where you are relaxed. Avoid bending over or lying down until 2-3 hours after eating.  Avoid eating meals 2-3 hours before bed.  Avoid drinking a lot of liquid with meals.  Cook foods using methods other than frying. Bake, grill, or broil food instead.  Avoid or limit: ? Chocolate. ? Peppermint or spearmint. ? Alcohol. ? Pepper. ? Black and decaffeinated coffee. ? Black and decaffeinated tea. ?  Bubbly (carbonated) soft drinks. ? Caffeinated energy drinks and soft drinks.  Limit high-fat foods such as: ? Fatty meat or fried foods. ? Whole milk, cream, butter, or ice cream. ? Nuts and nut butters. ? Pastries, donuts, and sweets made with butter or shortening.  Avoid foods that cause symptoms. These foods may be different for everyone. Common foods that cause symptoms include: ? Tomatoes. ? Oranges, lemons, and limes. ? Peppers. ? Spicy food. ? Onions and garlic. ? Vinegar. Lifestyle  Maintain a healthy weight. Ask your doctor what weight is healthy for you. If you need to lose weight, work with your doctor to do so safely.  Exercise for at least 30 minutes for 5 or more days each week, or as told by your doctor.  Wear loose-fitting clothes.  Do not smoke. If you need help quitting, ask your doctor.  Sleep with the head of your bed  higher than your feet. Use a wedge under the mattress or blocks under the bed frame to raise the head of the bed. Summary  When you have gastroesophageal reflux disease (GERD), food and lifestyle choices are very important in easing your symptoms.  Eat small meals often instead of 3 large meals a day. Eat your meals slowly, and in a place where you are relaxed.  Limit high-fat foods such as fatty meat or fried foods.  Avoid bending over or lying down until 2-3 hours after eating.  Avoid peppermint and spearmint, caffeine, alcohol, and chocolate. This information is not intended to replace advice given to you by your health care provider. Make sure you discuss any questions you have with your health care provider. Document Revised: 01/11/2019 Document Reviewed: 10/26/2016 Elsevier Patient Education  [employer].

## 2020-02-21 ENCOUNTER — Telehealth: Payer: Self-pay | Admitting: Gastroenterology

## 2020-02-21 NOTE — Telephone Encounter (Signed)
Pt calling wanting the name of the surgeon Dr. Myrtie Neither would recommend for her possible surgery. Please advise.

## 2020-02-21 NOTE — Telephone Encounter (Signed)
If she is ready for a surgical consult for her hiatal hernia and reflux, please send a referral to Dr. Ovidio Kin at [facility]

## 2020-02-21 NOTE — Telephone Encounter (Signed)
Attempted to call pt, no answer. 

## 2020-02-21 NOTE — Telephone Encounter (Signed)
Spoke with pt and she is ready to have surgical consult. Referral faxed to [facility] for appt with Dr. Ezzard Standing. Pt aware and knows she will be called from that office regarding an appt.

## 2020-03-06 ENCOUNTER — Other Ambulatory Visit: Payer: Self-pay | Admitting: Gastroenterology

## 2020-04-23 ENCOUNTER — Other Ambulatory Visit: Payer: Self-pay | Admitting: Gastroenterology

## 2020-05-09 ENCOUNTER — Telehealth: Payer: Self-pay | Admitting: Gastroenterology

## 2020-05-09 NOTE — Telephone Encounter (Signed)
80 mg twice daily is above the FDA-approved dosing for this medicine. The maximum dosing is 40 mg twice daily, and that is what I am willing to prescribe.  She does not need to see the surgeon if he does not wish to.

## 2020-05-09 NOTE — Telephone Encounter (Signed)
Patient is requesting an increase on her pantorazole. She states that she has been taking 2 po BID ( 80 mg BID) she states that this helps control all her symptoms . She has been doing this for several months from what she said. She did not follow with going to see [facility]. She declines surgery and feels like that it is just not for her.

## 2020-05-09 NOTE — Telephone Encounter (Signed)
Patient has been notified

## 2020-06-02 ENCOUNTER — Other Ambulatory Visit: Payer: Self-pay | Admitting: Gastroenterology

## 2020-07-22 IMAGING — MR MR LUMBAR SPINE W/O CM
4 of 5 series · 29 of 48 positions shown · non-contrast
Comparison: None.

CLINICAL DATA: History of scoliosis. Back pain radiating to right
buttock. Recent fall.

EXAM:
MRI LUMBAR SPINE WITHOUT CONTRAST
TECHNIQUE: Multiplanar, multisequence MR imaging of the lumbar spine was
performed. No intravenous contrast was administered.

[Series 3: T2 · sagittal · 4.0mm · 1.09mm/px · 8 of 22 slices shown (1 of 2)]
[im 1/22]
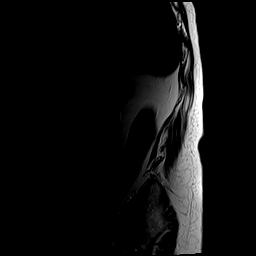
[im 4/22]
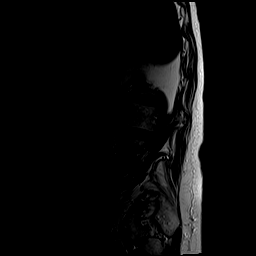
[im 7/22]
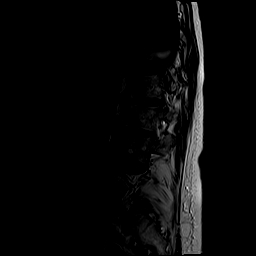
[im 10/22]
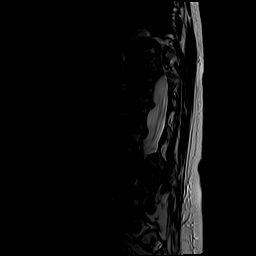
[im 13/22]
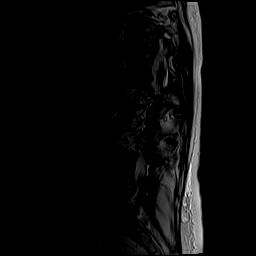
[im 16/22]
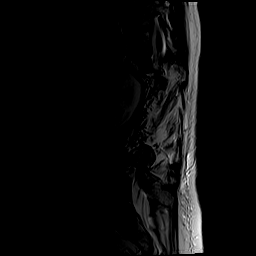
[im 19/22]
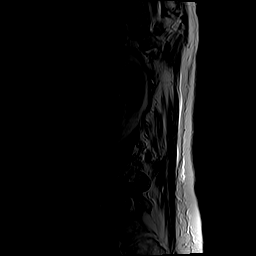
[im 22/22]
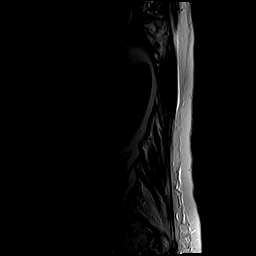

[Series 5: T1 · sagittal · 4.0mm · 1.09mm/px · 7 of 22 slices shown (1 of 2)]
[im 1/22]
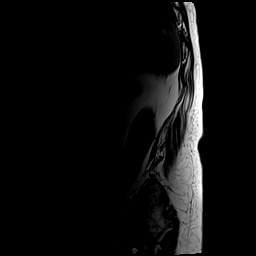
[im 4/22]
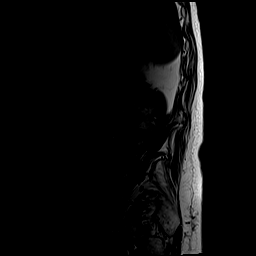
[im 8/22]
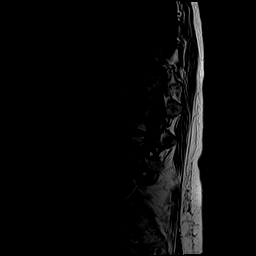
[im 11/22]
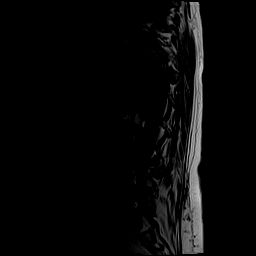
[im 15/22]
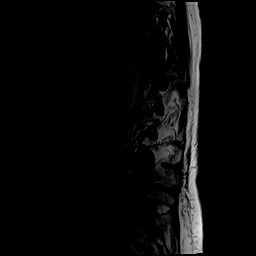
[im 18/22]
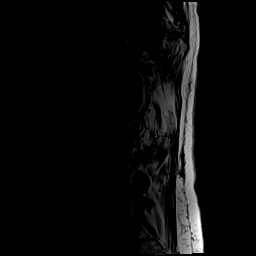
[im 22/22]
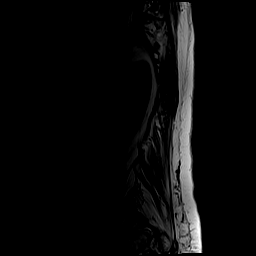

[Series 6: T2 · axial · 4.0mm · 0.39mm/px · z∈[-89,+97]mm · 9 of 40 slices shown (2 of 2)]
[im 1/40]
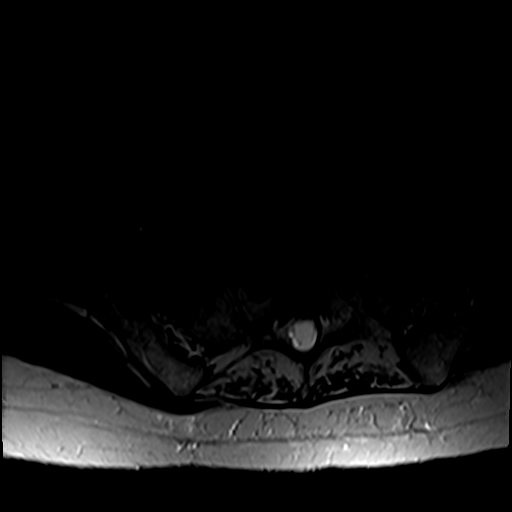
[im 7/40]
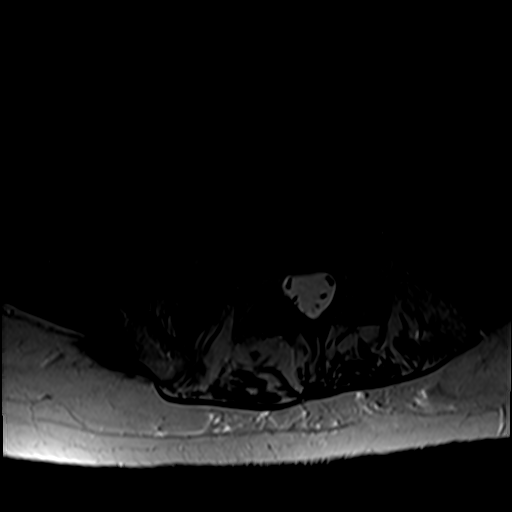
[im 14/40]
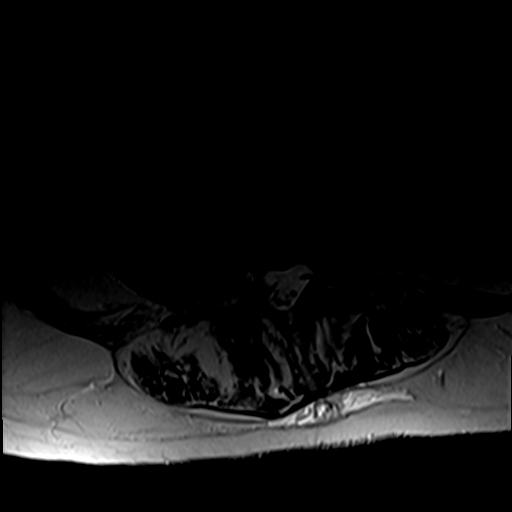
[im 17/40]
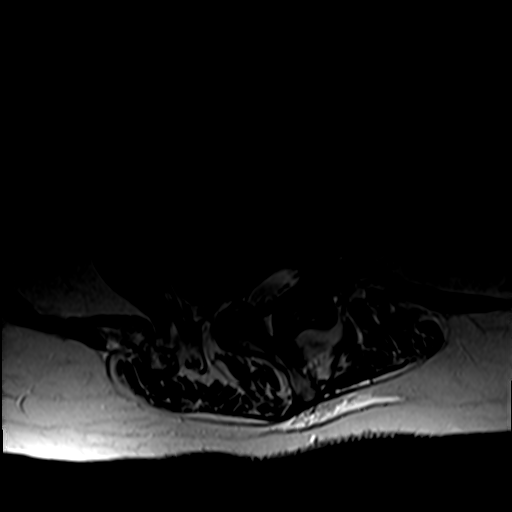
[im 20/40]
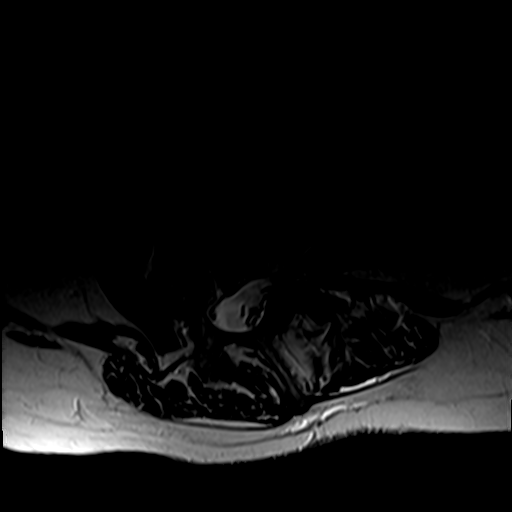
[im 23/40]
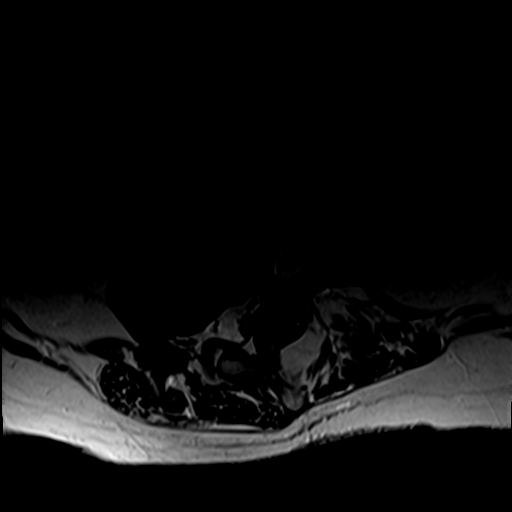
[im 27/40]
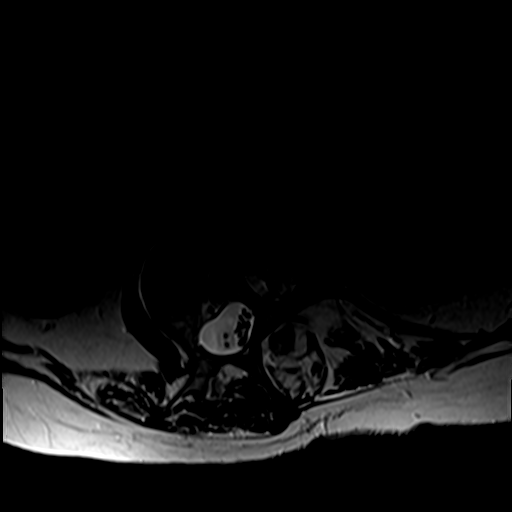
[im 33/40]
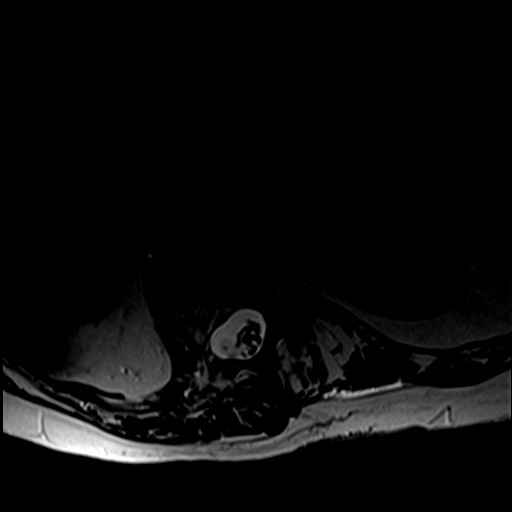
[im 40/40]
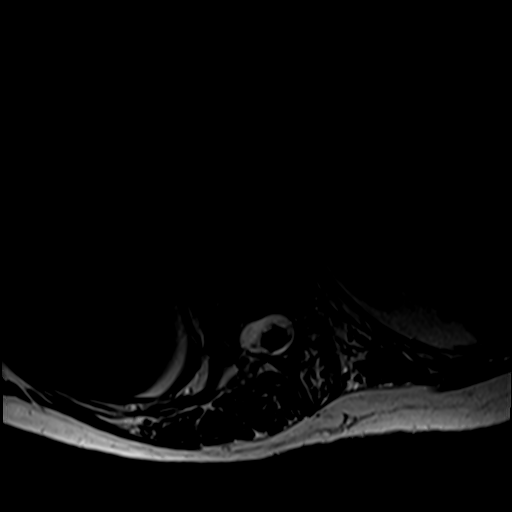

[Series 7: T1 · axial · 4.0mm · 0.39mm/px · z∈[-89,+64]mm · 5 of 40 slices shown (2 of 2)]
[im 1/40]
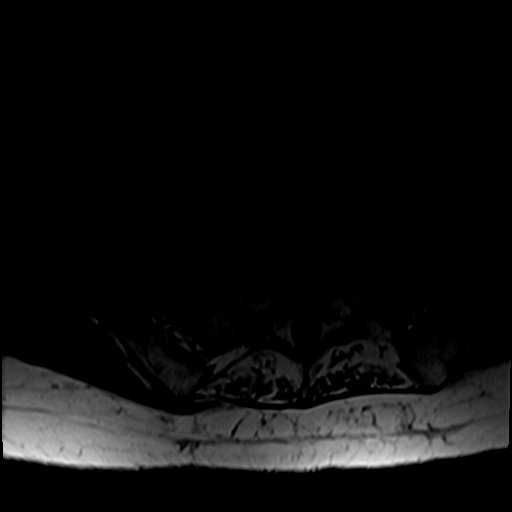
[im 7/40]
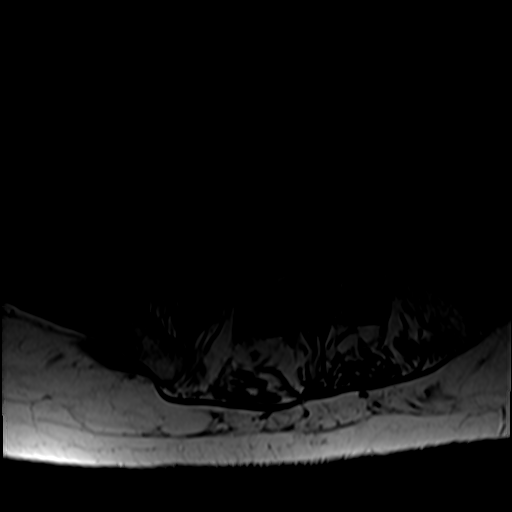
[im 14/40]
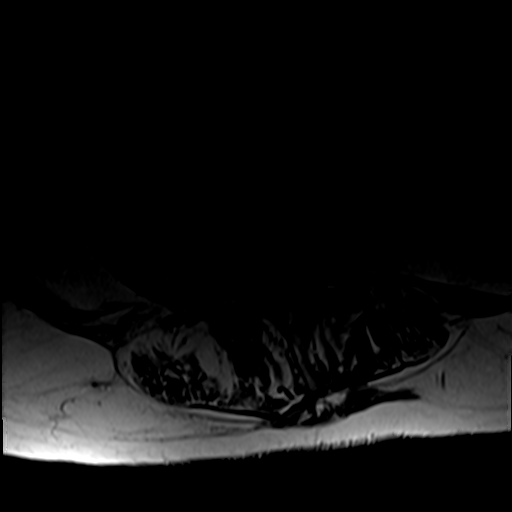
[im 20/40]
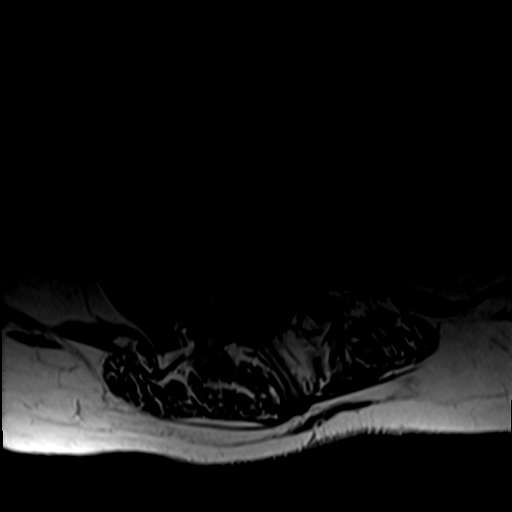
[im 33/40]
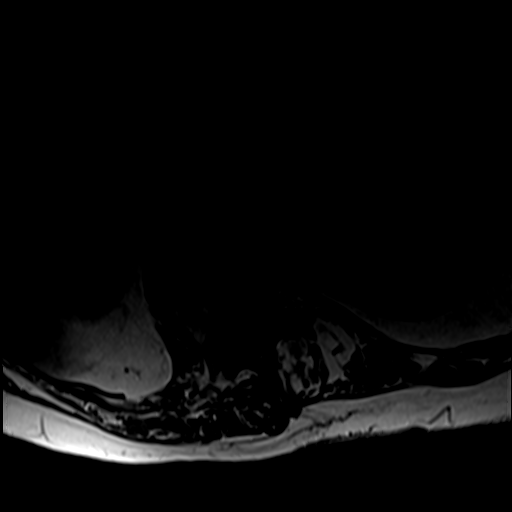

[29 of 48 positions shown; findings below may reference images not displayed]

FINDINGS: Segmentation:  Standard

Alignment:  There is right convex scoliosis with apex at L2.

Vertebrae: No acute compression fracture, discitis-osteomyelitis,
facet edema or other focal marrow lesion. No epidural collection.

Conus medullaris and cauda equina: Conus extends to the L1 level.
Conus and cauda equina appear normal.

Paraspinal and other soft tissues: Negative

Disc levels:

T12-L1: Mild left foraminal narrowing due to curvature. No spinal
canal stenosis.

L1-L2: Small left eccentric disc bulge. No spinal canal stenosis.
Moderate left foraminal stenosis due to curvature. No right
foraminal stenosis.

L2-L3: Disc space narrowing without focal herniation. No spinal
canal stenosis. Mild left foraminal narrowing due to curvature.

L3-L4: Intermediate disc bulge with endplate spurring. Moderate left
foraminal stenosis. No spinal canal stenosis or right foraminal
stenosis.

L4-L5: Small disc bulge with mild endplate spurring. Mild right
foraminal stenosis. No spinal canal stenosis.

L5-S1: Endplate spurring and disc space narrowing, right eccentric.
Moderate right foraminal stenosis. There is impingement on the
exiting right L5 nerve root. No spinal canal or left neural
foraminal stenosis.

Visualized sacrum: Normal.
IMPRESSION: 1. Moderate right L5-S1 neural foraminal stenosis with impingement
on the exiting right L5 nerve root. Correlate for corresponding
right L5 radiculopathy.
2. Moderate left L3-4 neural foraminal stenosis due to combination
of disc bulge and endplate spurring.
3. Moderate left L1-2 neural foraminal stenosis due to curvature.
4. Right convex scoliosis with apex at L2.

## 2020-08-22 ENCOUNTER — Telehealth: Payer: Self-pay | Admitting: Gastroenterology

## 2020-08-22 NOTE — Telephone Encounter (Signed)
Spoke with patient she states that she has been having nausea and vomiting, she states that about an hour/hour and a half after eating she has to vomit. She states that yesterday she was vomiting through her nose and mouth. Pt states that she can't drink tea or coffee, she states breakfast is the worse, she states that she has been trying to eat 6 small meals a day. Advised patient to try a bland diet, she states that she gets tired of eating grits and oatmeal. She states that she ate ketchup the other day and felt burning all down her esophagus. Advised patient that she should be avoiding anything spicy, greasy, acidic, citrus, and caffeine. Advised patient that she should not eat within 2-3 hours of going to bed, pt states that she does not eat right before laying down, she states that she has a cup of warm 2% milk before bed. Pt states that she has  lost 10 lbs over a month or so. Patient states that she is still taking Pantoprazole 1 tablet twice a day before meals, I asked her when she takes the medication and she said "right before I eat" I asked as in 30 minutes before? She replied "No, I take the pantoprazole then I eat" I advised patient that she should be taking the medication 30 minutes prior to eating, she states that she did not know that. Advised patient to try that over the weekend and to give [place] a call on Monday if she is not better. Offered patient a sooner appt, and she said she would keep January appt because the Doctor would probably just say she needed surgery for her hernia, advised that if it is large enough it could worsen reflux symptoms. Patient verbalized understanding of all information and had no other concerns at the end of the call.

## 2020-08-22 NOTE — Telephone Encounter (Signed)
Lm on vm for patient to return call 

## 2020-08-22 NOTE — Telephone Encounter (Signed)
Pt is requesting a call back from a nurse (missed call) 

## 2020-08-25 ENCOUNTER — Other Ambulatory Visit: Payer: Self-pay | Admitting: Gastroenterology

## 2020-10-09 ENCOUNTER — Ambulatory Visit: Payer: Medicare Other | Admitting: Gastroenterology

## 2020-11-24 ENCOUNTER — Other Ambulatory Visit: Payer: Self-pay

## 2020-11-24 ENCOUNTER — Ambulatory Visit: Payer: Medicare Other | Admitting: Neurology

## 2020-11-24 ENCOUNTER — Telehealth: Payer: Self-pay | Admitting: Neurology

## 2020-11-24 ENCOUNTER — Encounter: Payer: Self-pay | Admitting: Neurology

## 2020-11-24 VITALS — BP 191/82 | HR 79 | Ht 60.0 in | Wt 141.4 lb

## 2020-11-24 DIAGNOSIS — R413 Other amnesia: Secondary | ICD-10-CM | POA: Diagnosis not present

## 2020-11-24 DIAGNOSIS — G3184 Mild cognitive impairment, so stated: Secondary | ICD-10-CM | POA: Diagnosis not present

## 2020-11-24 MED ORDER — PREVAGEN 10 MG PO CAPS: 1.0000 | ORAL_CAPSULE | ORAL | 1 refills | Status: AC

## 2020-11-24 NOTE — Progress Notes (Signed)
[facility] [address] [place]. [place] [address] [phone]       OFFICE CONSULT NOTE  [name] Date of Birth:  1936/07/21 Medical Record Number:  [id number]   Referring MD: Guerry Bruin Reason for Referral: Memory loss HPI: Caroline [name] is a pleasant 85 year old Caucasian lady seen today for initial office consultation visit for memory loss.  History is obtained from the patient, review of electronic medical records personally reviewed pertinent imaging films in PACS.  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  She states she has no subjective memory complaints but her husband insisted that she come as he has been concern for the last 6 months to a year.  She often goes into a another room and forgets why she was there but when she focuses in a minute or so she remembers.  She has never missed any of her appointments and does not forget to take her medicines.  She is still quite independent and drives and has never gotten lost.  She still balancing her checkbook's.  She is independent in all activities of daily living and does her own house hold work..  She had a right thalamic lacunar infarct in May 2019 at that time she presented with some headache and fall but she recovered fairly quickly and had no residual deficits at discharge.  She has remained on Plavix which is tolerating well without bleeding or bruising.  She states her blood pressure has been under good control though it is elevated today in office at 191/82.  She has not followed up in the neurology clinic for stroke.  She denies any history of seizures, significant head injury with loss of consciousness, headaches or recurrent stroke or TIA symptoms.  There is no family stable Zimmers of memory loss.  She has no complaints today.  ROS:   14 system review of systems is positive for memory  loss, forgetfulness, decreased hearing, imbalance all other systems negative PMH:  Past Medical History:  Diagnosis Date  . Allergy   . Anxiety   . Arthritis   . Cataract   . Celiac sprue   . CVA (cerebral vascular accident) (HCC) 02/2018   R thalamic CVA  . DVT (deep vein thrombosis) in pregnancy   . GERD (gastroesophageal reflux disease)   . Glaucoma   . Hormone replacement therapy (HRT)   . Hypercholesteremia   . Hyperlipidemia   . Hypertension   . Hypoglycemia   . Hypothyroidism   . OAB (overactive bladder)   . Osteoporosis   . Raynaud's disease   . Scoliosis   . Seasonal allergies   . Senile purpura (HCC)   . 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   Bridge Left Nose, and Left Neck  . 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   Bridge of nose tx Cx3 and 5FU, Left neck Tx Cx3 and 5FU  . Thrombophlebitis     Social History:  Social History   Socioeconomic History  . Marital status: Married    Spouse name: Caroline [name] Reveal  . Number of children: 3  . Years of education: 25  . Highest education level: Not on file  Occupational History  . Occupation: retired  Tobacco Use  .  Smoking status: Never Smoker  . Smokeless tobacco: Never Used  Vaping Use  . Vaping Use: Never used  Substance and Sexual Activity  . Alcohol use: No  . Drug use: Not Currently  . Sexual activity: Never  Other Topics Concern  . Not on file  Social History Narrative   Lives with husband   Right Handed   Drinks no caffeine   Social Determinants of Health   Financial Resource Strain: Not on file  Food Insecurity: Not on file  Transportation Needs: Not on file  Physical Activity: Not on file  Stress: Not on file  Social Connections: Not on file  Intimate Partner Violence: Not on file    Medications:   Current  Outpatient Medications on File Prior to Visit  Medication Sig Dispense Refill  . Ascorbic Acid (VITAMIN C) 100 MG tablet Take 100 mg by mouth every other day.     . B Complex-Biotin-FA (VITAMIN B50 COMPLEX PO) Take 1 tablet by mouth every other day.    . Cholecalciferol (VITAMIN D-3) 1000 units CAPS Take 1,000 Units by mouth daily.    . clopidogrel (PLAVIX) 75 MG tablet Take 75 mg by mouth daily.    . Coenzyme Q10 (COQ10 PO) Take by mouth.    . Cyanocobalamin (B-12) 250 MCG TABS Take 250 mcg by mouth every other day.     Marland Kitchen Dextrose-Fructose-Sod Citrate (NAUZENE) [phone] MG CHEW Chew 1 Chip by mouth as needed.    . hydrochlorothiazide (MICROZIDE) 12.5 MG capsule Take 12.5 mg by mouth as needed.    . irbesartan (AVAPRO) 150 MG tablet Take 150 mg by mouth daily.     Marland Kitchen levothyroxine (SYNTHROID, LEVOTHROID) 125 MCG tablet Take 1 tablet by mouth daily.    . Omega-3 Fatty Acids (FISH OIL PO) Take 1,000 mg by mouth.    . ondansetron (ZOFRAN) 4 MG tablet TAKE 1 TABLET BY MOUTH EVERY 8 HOURS AS NEEDED FOR NAUSEA OR VOMITING. 30 tablet 0  . pantoprazole (PROTONIX) 40 MG tablet TAKE 1 TABLET BY MOUTH 2 TIMES DAILY BEFORE A MEAL. 180 tablet 1  . Potassium 99 MG TABS Take by mouth.    . simethicone (MYLICON) 125 MG chewable tablet Chew 125 mg by mouth every 6 (six) hours as needed for flatulence.    . traMADol (ULTRAM) 50 MG tablet Take 25 mg by mouth every 6 (six) hours as needed.     No current facility-administered medications on file prior to visit.    Allergies:   Allergies  Allergen Reactions  . Gluten Meal Nausea And Vomiting    Patient has CELIAC DISEASE  . Wheat Bran Nausea And Vomiting    Patient has CELIAC DISEASE  . Adhesive [Tape] Other (See Comments)    Patient's skin is VERY THIN and tears very easily; please use either paper tape or Coban wrap  . Codeine Nausea And Vomiting  . Other Other (See Comments)    Patient has Hypoglycemia; her husband stated "when her sugar crashes, it  crashes hard."    Physical Exam General: well developed, well nourished pleasant elderly Caucasian lady, seated, in no evident distress Head: head normocephalic and atraumatic.   Neck: supple with no carotid or supraclavicular bruits Cardiovascular: regular rate and rhythm, no murmurs Musculoskeletal: Severe kyphoscoliosis Skin:  no rash/petichiae Vascular:  Normal pulses all extremities  Neurologic Exam Mental Status: Awake and fully alert. Oriented to place and time. Recent and remote memory intact. Attention span, concentration and fund of knowledge appropriate.  Mood and affect appropriate.  Diminished recall 1/3.  Able to name only 9 animals which can walk on 4 legs.  Clock drawing 3/4.  Mini-Mental status exam score 26/30.  Neurotic depression score 2 not depressed Cranial Nerves: Fundoscopic exam reveals sharp disc margins. Pupils equal, briskly reactive to light. Extraocular movements full without nystagmus. Visual fields full to confrontation. Hearing intact. Facial sensation intact. Face, tongue, palate moves normally and symmetrically.  Motor: Normal bulk and tone. Normal strength in all tested extremity muscles. Sensory.: intact to touch , pinprick , position and vibratory sensation.  Coordination: Rapid alternating movements normal in all extremities. Finger-to-nose and heel-to-shin performed accurately bilaterally. Gait and Station: Arises from chair without difficulty. Stance is normal. Gait demonstrates normal stride length and mild imbalance .  Unsteady while standing on a narrow base and while standing on either foot unsupported.  Not able to heel, toe and tandem walk without difficulty.  Reflexes: 1+ and symmetric. Toes downgoing.    MMSE - Mini Mental State Exam 11/24/2020  Orientation to time 5  Orientation to Place 5  Registration 3  Attention/ Calculation 3  Recall 2  Language- name 2 objects 2  Language- repeat 1  Language- follow 3 step command 3  Language- read &  follow direction 1  Write a sentence 1  Copy design 0  Total score 26     ASSESSMENT: 85 year old Caucasian lady with subacute short-term memory and mild cognitive difficulties due to mild cognitive impairment which is age-appropriate.  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  I recommend further evaluation by checking lab work for reversible causes of memory loss, hemoglobin A1c, lipid profile, MRI scan of the brain and EEG.  I recommend she increase participation in cognitively challenging activities like solving crossword puzzles, playing bridge and sodoku.  She can try taking Previgen 10 mg daily for memory enhancement.  We also discussed memory compensation strategies.  She will continue on Plavix for stroke prevention and maintain aggressive risk factor modification with strict control of hypertension with blood pressure goal below 130/90, lipids with LDL cholesterol goal below 70 mg percent and diabetes with hemoglobin A1c goal below 6.5%.  She will return for follow-up in the future in 2 months or call earlier if necessary.  Greater than 50% time during this 45-minute consultation visit was spent on counseling and coordination of care about transient confusion and memory difficulties and discussion about mild cognitive impairment and answering questions. Delia Heady, MD  [facility] [address] [place], [place] [address]  Phone [phone] Fax [phone] Note: This document was prepared with digital dictation and possible smart phrase technology. Any transcriptional errors that result from this process are unintentional.

## 2020-11-24 NOTE — Patient Instructions (Addendum)
I had a long discussion with the patient with regards to her symptoms of memory difficulties and previous stroke and answered questions.  I recommend further evaluation by checking lab work for reversible causes of memory loss, hemoglobin A1c, lipid profile, MRI scan of the brain and EEG.  I recommend she increase participation in cognitively challenging activities like solving crossword puzzles, playing bridge and sodoku.  She can try taking Previgen 10 mg daily for memory enhancement.  We also discussed memory compensation strategies.  She will continue on Plavix for stroke prevention and maintain aggressive risk factor modification with strict control of hypertension with blood pressure goal below 130/90, lipids with LDL cholesterol goal below 70 mg percent and diabetes with hemoglobin A1c goal below 6.5%.  She will return for follow-up in the future in 2 months or call earlier if necessary. Memory Compensation Strategies  1. Use "WARM" strategy.  W= write it down  A= associate it  R= repeat it  M= make a mental note  2.   You can keep a Glass blower/designer.  Use a 3-ring notebook with sections for the following: calendar, important names and phone numbers,  medications, doctors' names/phone numbers, lists/reminders, and a section to journal what you did  each day.   3.    Use a calendar to write appointments down.  4.    Write yourself a schedule for the day.  This can be placed on the calendar or in a separate section of the Memory Notebook.  Keeping a  regular schedule can help memory.  5.    Use medication organizer with sections for each day or morning/evening pills.  You may need help loading it  6.    Keep a basket, or pegboard by the door.  Place items that you need to take out with you in the basket or on the pegboard.  You may also want to  include a message board for reminders.  7.    Use sticky notes.  Place sticky notes with reminders in a place where the task is performed.  For  example: " turn off the  stove" placed by the stove, "lock the door" placed on the door at eye level, " take your medications" on  the bathroom mirror or by the place where you normally take your medications.  8.    Use alarms/timers.  Use while cooking to remind yourself to check on food or as a reminder to take your medicine, or as a  reminder to make a call, or as a reminder to perform another task, etc.

## 2020-11-24 NOTE — Telephone Encounter (Signed)
[employer] medicare order sent to [facility]. No auth they will reach out to the patient to schedule.  

## 2020-11-25 ENCOUNTER — Ambulatory Visit: Payer: Medicare Other | Admitting: Neurology

## 2020-11-25 LAB — LIPID PANEL
Chol/HDL Ratio: 3.5 ratio (ref 0.0–4.4)
Cholesterol, Total: 237 mg/dL — ABNORMAL HIGH (ref 100–199)
HDL: 68 mg/dL (ref >39)
LDL Chol Calc (NIH): 148 mg/dL — ABNORMAL HIGH (ref 0–99)
Triglycerides: 121 mg/dL (ref 0–149)
VLDL Cholesterol Cal: 21 mg/dL (ref 5–40)

## 2020-11-25 LAB — DEMENTIA PANEL
Homocysteine: 11.3 umol/L (ref 0.0–21.3)
RPR Ser Ql: NONREACTIVE
TSH: 1.76 u[IU]/mL (ref 0.450–4.500)
Vitamin B-12: 513 pg/mL (ref 232–1245)

## 2020-11-25 LAB — HEMOGLOBIN A1C
Est. average glucose Bld gHb Est-mCnc: 117 mg/dL
Hgb A1c MFr Bld: 5.7 % — ABNORMAL HIGH (ref 4.8–5.6)

## 2020-12-02 ENCOUNTER — Other Ambulatory Visit: Payer: Self-pay | Admitting: Neurology

## 2020-12-02 MED ORDER — ATORVASTATIN CALCIUM 40 MG PO TABS: 40.0000 mg | ORAL_TABLET | Freq: Every day | ORAL | 3 refills | Status: DC

## 2020-12-02 NOTE — Progress Notes (Signed)
Kindly inform the patient that lab work for reversible causes of memory loss was all normal and cholesterol profile was not satisfactory with elevated bad cholesterol given her prior history of stroke I recommend she start taking Lipitor 40 mg daily and have follow-up with him for checking 2 months.  Screening test for diabetes was satisfactory

## 2020-12-04 ENCOUNTER — Telehealth: Payer: Self-pay | Admitting: Neurology

## 2020-12-04 ENCOUNTER — Other Ambulatory Visit: Payer: Medicare Other

## 2020-12-04 NOTE — Telephone Encounter (Signed)
-----   Message from Micki Riley, MD sent at 12/02/2020  5:41 PM EST ----- Kindly inform the patient that lab work for reversible causes of memory loss was all normal and cholesterol profile was not satisfactory with elevated bad cholesterol given her prior history of stroke I recommend she start taking Lipitor 40 mg daily and have follow-up with him for checking 2 months.  Screening test for diabetes was satisfactory

## 2020-12-04 NOTE — Telephone Encounter (Signed)
Called the patient, there was no answer LVM asking for the patient to call back.   **If patient returns call please advise the patient Dr. Meryl Dare has looked over the lab results and with the exception of cholesterol levels everything was in a normal range.  He does recommend treatment for the elevated cholesterol levels with the medication called atorvastatin.  This is best taking once a day in the evening.  It has been sent to the patient's pharmacy.

## 2020-12-11 ENCOUNTER — Telehealth: Payer: Self-pay | Admitting: Emergency Medicine

## 2020-12-11 NOTE — Telephone Encounter (Signed)
-----   Message from Micki Riley, MD sent at 12/02/2020  5:41 PM EST ----- Kindly inform the patient that lab work for reversible causes of memory loss was all normal and cholesterol profile was not satisfactory with elevated bad cholesterol given her prior history of stroke I recommend she start taking Lipitor 40 mg daily and have follow-up with him for checking 2 months.  Screening test for diabetes was satisfactory

## 2020-12-11 NOTE — Telephone Encounter (Signed)
Called patient and went over Dr. Marlis Edelson review and findings regarding blood work.    Patient denied further questions, verbalized understanding and expressed appreciation for the phone call.

## 2020-12-12 ENCOUNTER — Ambulatory Visit
Admission: RE | Admit: 2020-12-12 | Discharge: 2020-12-12 | Disposition: A | Discharge status: Home / Self Care | Payer: Medicare Other | Source: Ambulatory Visit | Attending: Neurology | Admitting: Neurology

## 2020-12-12 DIAGNOSIS — R413 Other amnesia: Secondary | ICD-10-CM | POA: Diagnosis not present

## 2020-12-12 MED ORDER — GADOBENATE DIMEGLUMINE 529 MG/ML IV SOLN
10.0000 mL | Freq: Once | INTRAVENOUS | Status: AC | PRN
  Administered 2020-12-12: 10 mL via INTRAVENOUS

## 2020-12-21 NOTE — Progress Notes (Signed)
Kindly inform the patient that MR I scan of the brain showed a small remote age stroke in the deep portion of the brain on the right as well as age-related changes of hardening of the arteries and mild shrinkage of the brain.  No new abnormality or any worrisome finding.

## 2020-12-23 ENCOUNTER — Telehealth: Payer: Self-pay | Admitting: *Deleted

## 2020-12-23 NOTE — Telephone Encounter (Signed)
Called patient and informed her that MRI scan of the brain showed a small remote age stroke in the deep portion of the brain on the right as well as age-related changes of hardening of the arteries and mild shrinkage of the brain. No new abnormality or any worrisome finding. She stated they gave her dye and she felt nauseous, had a  headache all weekend. I advised in future if MD wants to order MRI she let them know she doesn't want dye. Patient verbalized understanding, appreciation.

## 2020-12-24 ENCOUNTER — Ambulatory Visit: Payer: Medicare Other | Admitting: Neurology

## 2021-01-05 ENCOUNTER — Other Ambulatory Visit: Payer: Medicare Other

## 2021-01-26 ENCOUNTER — Other Ambulatory Visit: Payer: Self-pay

## 2021-01-26 ENCOUNTER — Ambulatory Visit: Payer: Medicare Other | Admitting: Neurology

## 2021-01-26 DIAGNOSIS — R410 Disorientation, unspecified: Secondary | ICD-10-CM

## 2021-01-26 DIAGNOSIS — R413 Other amnesia: Secondary | ICD-10-CM

## 2021-02-02 ENCOUNTER — Ambulatory Visit (INDEPENDENT_AMBULATORY_CARE_PROVIDER_SITE_OTHER): Payer: Medicare Other | Admitting: Gastroenterology

## 2021-02-02 ENCOUNTER — Encounter: Payer: Self-pay | Admitting: Gastroenterology

## 2021-02-02 VITALS — BP 150/60 | HR 81 | Ht 64.0 in | Wt 142.0 lb

## 2021-02-02 DIAGNOSIS — K2100 Gastro-esophageal reflux disease with esophagitis, without bleeding: Secondary | ICD-10-CM | POA: Diagnosis not present

## 2021-02-02 DIAGNOSIS — K449 Diaphragmatic hernia without obstruction or gangrene: Secondary | ICD-10-CM

## 2021-02-02 NOTE — Progress Notes (Signed)
Caroline [name]  Chief Complaint: symptomatic hiatal hernia  Subjective  History: From my last clinic [name] 02/05/20: "Caroline [name] has been seen multiple times for episodes that she describes as "vomiting", but appear to be severe reflux based on endoscopic findings in both February and June 2020.  On the second exam, there was little improvement in endoscopic findings on high-dose PPI and antireflux measures.  I have currently recommended she consider surgical repair of the hiatal hernia, but she has been reluctant to do so.  Last clinic visit with one of our PAs September 2020 for same symptoms.  Gastric emptying study shortly afterwards was normal.  Patient was then referred to [facility], but she decided not to see them.   Caroline [name] is much the same as before, with intermittent heartburn and episodes of severe regurgitation that are heralded by "mouth watering". She has learned to avoid certain foods including coffee, and she rarely has alcohol. She mostly wanted to discuss dietary interventions to control GERD. She has tried elevating her head of bed and found some success with diet. She stopped drinking coffee in the morning because she was having multiple regurgitation episodes." -------  Caroline [name] was here for ongoing symptoms as before.  She will have episodes where her mouth waters and then she will have severe regurgitation to the point of vomiting several times.  It then passes and she feels fine.  She does not get preceding nausea, and her episodes are somewhat unpredictable.  She can eat spaghetti sauce 1 day, and then another time it will seem to make her sick.  However, most of these episodes lately seem to occur after breakfast. She visited her family and a few episodes occurred, and they suggested her it could be stress.  She says that she would like to reconsider seeing a surgeon for consultation, but is concerned because she does not feel her husband can handle the stress if she underwent  surgery. She denies dysphagia or odynophagia or weight loss.  In addition, she was found to have osteoporosis and was prescribed alendronate, which she picked up but not yet started because she read the possible esophagitis side effects.  ROS: Cardiovascular:  no chest pain Respiratory: no dyspnea Chronic back pain Remainder of systems negative except as above The patient's Past Medical, Family and Social History were reviewed and are on file in the EMR.  Objective:  Med list reviewed  Current Outpatient Medications:  .  Ascorbic Acid (VITAMIN C) 100 MG tablet, Take 100 mg by mouth every other day. , Disp: , Rfl:  .  atorvastatin (LIPITOR) 40 MG tablet, Take 1 tablet (40 mg total) by mouth daily., Disp: 30 tablet, Rfl: 3 .  B Complex-Biotin-FA (VITAMIN B50 COMPLEX PO), Take 1 tablet by mouth every other day., Disp: , Rfl:  .  Cholecalciferol (VITAMIN D-3) 1000 units CAPS, Take 1,000 Units by mouth daily., Disp: , Rfl:  .  clopidogrel (PLAVIX) 75 MG tablet, Take 75 mg by mouth daily., Disp: , Rfl:  .  Coenzyme Q10 (COQ10 PO), Take by mouth., Disp: , Rfl:  .  Cyanocobalamin (B-12) 250 MCG TABS, Take 250 mcg by mouth every other day. , Disp: , Rfl:  .  Dextrose-Fructose-Sod Citrate (NAUZENE) [phone] MG CHEW, Chew 1 Chip by mouth as needed., Disp: , Rfl:  .  hydrochlorothiazide (MICROZIDE) 12.5 MG capsule, Take 12.5 mg by mouth as needed., Disp: , Rfl:  .  irbesartan (AVAPRO) 150 MG tablet, Take 150 mg  by mouth daily. , Disp: , Rfl:  .  levothyroxine (SYNTHROID, LEVOTHROID) 125 MCG tablet, Take 1 tablet by mouth daily., Disp: , Rfl:  .  Omega-3 Fatty Acids (FISH OIL PO), Take 1,000 mg by mouth., Disp: , Rfl:  .  ondansetron (ZOFRAN) 4 MG tablet, TAKE 1 TABLET BY MOUTH EVERY 8 HOURS AS NEEDED FOR NAUSEA OR VOMITING., Disp: 30 tablet, Rfl: 0 .  pantoprazole (PROTONIX) 40 MG tablet, TAKE 1 TABLET BY MOUTH 2 TIMES DAILY BEFORE A MEAL., Disp: 180 tablet, Rfl: 1 .  Potassium 99 MG TABS, Take  by mouth., Disp: , Rfl:  .  simethicone (MYLICON) 125 MG chewable tablet, Chew 125 mg by mouth every 6 (six) hours as needed for flatulence., Disp: , Rfl:  .  traMADol (ULTRAM) 50 MG tablet, Take 25 mg by mouth every 6 (six) hours as needed., Disp: , Rfl:    Vital signs in last 24 hrs: Vitals:   02/02/21 1436  BP: (!) 150/60  Pulse: 81   Wt Readings from Last 3 Encounters:  02/02/21 142 lb (64.4 kg)  11/24/20 141 lb 6.4 oz (64.1 kg)  02/05/20 147 lb 3.2 oz (66.8 kg)    Physical Exam  Well-appearing, normal vocal quality  HEENT: sclera anicteric, oral mucosa moist without lesions  Neck: supple, no thyromegaly, JVD or lymphadenopathy  Cardiac: RRR without murmurs, S1S2 heard, no peripheral edema  Pulm: clear to auscultation bilaterally, normal RR and effort noted  Abdomen: soft, no tenderness, with active bowel sounds. No guarding or palpable hepatosplenomegaly.  Skin; warm and dry, no jaundice or rash  Labs:   ___________________________________________ Radiologic studies:   ____________________________________________ Other: EKG 02/2018 with nml QTC  _____________________________________________ Assessment & Plan  Assessment: Encounter Diagnoses  Name Primary?  . Gastroesophageal reflux disease with esophagitis without hemorrhage Yes  . Hiatal hernia    Reflux esophagitis from suboptimally controlled GERD, at least in part from a hiatal hernia seen on prior upper endoscopy.  Her esophagitis did not improve on the subsequent EGD on high-dose acid suppression. She has ongoing symptoms that she still describes as "vomiting", but what I believe are severe regurgitation to the point that vomiting then occurs.  She has normal gastric emptying (normal GES) and no gastric outlet obstruction on endoscopy.  I did my best to explain the nature of this condition she has, review her endoscopy and imaging reports and described the limitation of acid suppression medicine and  dietary changes to control this problem.  I still feel she needs to consider a surgical hernia repair and toupee fundoplication.  She is now willing to do that, so I referred her to [facility].  I have also scheduled an upper GI series to see if the hiatal hernia has significantly changed since her last endoscopy.  She did not feel that any antiemetics were needed because she does not really get nausea before these episodes.  Lastly, I agree with her that there is high risk of the Fosamax worsening the esophagitis because of her severe known reflux.  Consideration should be given to alternate therapy such as periodic IV infusion.   31 minutes were spent on this encounter (including chart review, history/exam, counseling/coordination of care, and documentation) > 50% of that time was spent on counseling and coordination of care.  Topics discussed included: Reflux, utility and limitations of dietary therapy as well as plans for further workup and testing.  Caroline [name]

## 2021-02-02 NOTE — Patient Instructions (Signed)
If you are age 83 or older, your body mass index should be between 23-30. Your Body mass index is 24.37 kg/m. If this is out of the aforementioned range listed, please consider follow up with your Primary Care Provider.  If you are age 37 or younger, your body mass index should be between 19-25. Your Body mass index is 24.37 kg/m. If this is out of the aformentioned range listed, please consider follow up with your Primary Care Provider.   You have been scheduled for an Upper GI Series at [facility]. Your appointment is on 02-13-2021 at 930am. Please arrive 15 minutes prior to your test for registration. Make sure not to eat or drink anything after midnight on the night before your test. If you need to reschedule, please call radiology at [phone]. ________________________________________________________________ An upper GI series uses x rays to help diagnose problems of the upper GI tract, which includes the esophagus, stomach, and duodenum. The duodenum is the first part of the small intestine. An upper GI series is conducted by a radiology technologist or a radiologist--a doctor who specializes in x-ray imaging--at a hospital or outpatient center. While sitting or standing in front of an x-ray machine, the patient drinks barium liquid, which is often white and has a chalky consistency and taste. The barium liquid coats the lining of the upper GI tract and makes signs of disease show up more clearly on x rays. X-ray video, called fluoroscopy, is used to view the barium liquid moving through the esophagus, stomach, and duodenum. Additional x rays and fluoroscopy are performed while the patient lies on an x-ray table. To fully coat the upper GI tract with barium liquid, the technologist or radiologist may press on the abdomen or ask the patient to change position. Patients hold still in various positions, allowing the technologist or radiologist to take x rays of the upper GI tract at different  angles. If a technologist conducts the upper GI series, a radiologist will later examine the images to look for problems.  This test typically takes about 1 hour to complete. __________________________________________________________________  We will send your records to [facility]. They will call you to schedule an appointment. Make certain to bring a list of current medications, including any over the counter medications or vitamins. Also bring your co-pay if you have one as well as your insurance cards. [facility] is located at [address], [address]. Should you need to reschedule your appointment, please contact them at [phone].  It was a pleasure to see you today!  Thank you for trusting me with your gastrointestinal care!

## 2021-02-03 ENCOUNTER — Ambulatory Visit: Payer: Medicare Other | Admitting: Neurology

## 2021-02-03 ENCOUNTER — Encounter: Payer: Self-pay | Admitting: Neurology

## 2021-02-03 VITALS — BP 170/81 | HR 76 | Ht 64.0 in | Wt 141.0 lb

## 2021-02-03 DIAGNOSIS — Z8673 Personal history of transient ischemic attack (TIA), and cerebral infarction without residual deficits: Secondary | ICD-10-CM | POA: Diagnosis not present

## 2021-02-03 DIAGNOSIS — G3184 Mild cognitive impairment, so stated: Secondary | ICD-10-CM

## 2021-02-03 MED ORDER — CEREFOLIN 6-1-50-5 MG PO TABS: 1.0000 | ORAL_TABLET | ORAL | 3 refills | Status: AC

## 2021-02-03 NOTE — Patient Instructions (Signed)
I had a long discussion with the patient with regards to her mild memory and cognitive impairment and discussed results of the lab work, EEG and MRI scan and answered questions.  I recommend she try taking Cerefolin NAC to help with her cognitive impairment and discontinue vitamin B-12 tablets.  She was also encouraged to increase participation in cognitively challenging activities like solving crossword puzzles, playing bridge and sodoku.  We also discussed memory compensation strategies.  She will stay on Plavix for stroke prevention and maintain aggressive risk factor modification with strict control of hypertension with blood pressure goal below 130/90, lipids with LDL cholesterol goal below 70 mg percent and diabetes with hemoglobin A1c goal below 6.5%.  She will return for follow-up in the future in 6 months or call earlier if necessary. Memory Compensation Strategies  1. Use "WARM" strategy.  W= write it down  A= associate it  R= repeat it  M= make a mental note  2.   You can keep a Glass blower/designer.  Use a 3-ring notebook with sections for the following: calendar, important names and phone numbers,  medications, doctors' names/phone numbers, lists/reminders, and a section to journal what you did  each day.   3.    Use a calendar to write appointments down.  4.    Write yourself a schedule for the day.  This can be placed on the calendar or in a separate section of the Memory Notebook.  Keeping a  regular schedule can help memory.  5.    Use medication organizer with sections for each day or morning/evening pills.  You may need help loading it  6.    Keep a basket, or pegboard by the door.  Place items that you need to take out with you in the basket or on the pegboard.  You may also want to  include a message board for reminders.  7.    Use sticky notes.  Place sticky notes with reminders in a place where the task is performed.  For example: " turn off the  stove" placed by the stove,  "lock the door" placed on the door at eye level, " take your medications" on  the bathroom mirror or by the place where you normally take your medications.  8.    Use alarms/timers.  Use while cooking to remind yourself to check on food or as a reminder to take your medicine, or as a  reminder to make a call, or as a reminder to perform another task, etc.

## 2021-02-03 NOTE — Progress Notes (Signed)
[facility] [address] [place]. [place] [address] [phone]       OFFICE FOLLOW UP VISIT NOTE  [name] Date of Birth:  [birth year] Medical Record Number:  [id number]   Referring MD: Guerry Bruin Reason for Referral: Memory loss HPI: Initial visit 11/24/2020 Caroline [name] is a pleasant 85 year old Caucasian lady seen today for initial office consultation visit for memory loss.  History is obtained from the patient, review of electronic medical records personally reviewed pertinent imaging films in PACS.  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  She states she has no subjective memory complaints but her husband insisted that she come as he has been concern for the last 6 months to a year.  She often goes into a another room and forgets why she was there but when she focuses in a minute or so she remembers.  She has never missed any of her appointments and does not forget to take her medicines.  She is still quite independent and drives and has never gotten lost.  She still balancing her checkbook's.  She is independent in all activities of daily living and does her own house hold work..  She had a right thalamic lacunar infarct in May 2019 at that time she presented with some headache and fall but she recovered fairly quickly and had no residual deficits at discharge.  She has remained on Plavix which is tolerating well without bleeding or bruising.  She states her blood pressure has been under good control though it is elevated today in office at 191/82.  She has not followed up in the neurology clinic for stroke.  She denies any history of seizures, significant head injury with loss of consciousness, headaches or recurrent stroke or TIA symptoms.  There is no family stable Zimmers of memory loss.  She has no complaints today. Update 02/03/2021 : She returns  for follow-up after last visit 2 and half months ago.  Patient states she continues to have mild short-term memory and cognitive difficulties.  She does do daily puzzles and takes vitamins.  She did undergo lab work for reversible causes of memory loss on last visit all of which was normal.  EEG done on 01/26/2021 was normal.  MRI scan of the brain done on 12/12/2020 shows old remote age right thalamic lacunar infarct and mild generalized atrophy and changes of chronic small vessel disease.  No acute abnormality was noted.  Patient remains on Plavix which is tolerating well without bruising or bleeding.  Her blood pressure normally is well controlled at home the today it is elevated in office at 170/81.  She is does see physical therapies once a week for her severe kyphoscoliosis.  She has had no falls or injuries.  She has no new complaints. ROS:   14 system review of systems is positive for memory loss, forgetfulness, decreased hearing, imbalance all other systems negative PMH:  Past Medical History:  Diagnosis Date  . Allergy   . Anxiety   . Arthritis   . Cataract   . Celiac sprue   . CVA (cerebral vascular accident) (HCC) 02/2018   R thalamic CVA  . DVT (deep vein thrombosis) in pregnancy   . GERD (gastroesophageal reflux disease)   . Glaucoma   . Hormone replacement therapy (HRT)   . Hypercholesteremia   . Hyperlipidemia   . Hypertension   . Hypoglycemia   .  Hypothyroidism   . OAB (overactive bladder)   . Osteoporosis   . Raynaud's disease   . Scoliosis   . Seasonal allergies   . Senile purpura (HCC)   . 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   Bridge Left Nose, and Left Neck  . 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   Bridge of nose tx Cx3 and 5FU, Left neck Tx Cx3 and 5FU  . Thrombophlebitis      Social History:  Social History   Socioeconomic History  . Marital status: Married    Spouse name: Blondell RevealWinslow  . Number of children: 3  . Years of education: 5412  . Highest education level: Not on file  Occupational History  . Occupation: retired  Tobacco Use  . Smoking status: Never Smoker  . Smokeless tobacco: Never Used  Vaping Use  . Vaping Use: Never used  Substance and Sexual Activity  . Alcohol use: No  . Drug use: Not Currently  . Sexual activity: Never  Other Topics Concern  . Not on file  Social History Narrative   Lives with husband   Right Handed   Drinks no caffeine   Social Determinants of Health   Financial Resource Strain: Not on file  Food Insecurity: Not on file  Transportation Needs: Not on file  Physical Activity: Not on file  Stress: Not on file  Social Connections: Not on file  Intimate Partner Violence: Not on file    Medications:   Current Outpatient Medications on File Prior to Visit  Medication Sig Dispense Refill  . Ascorbic Acid (VITAMIN C) 100 MG tablet Take 100 mg by mouth every other day.     Marland Kitchen. atorvastatin (LIPITOR) 40 MG tablet Take 1 tablet (40 mg total) by mouth daily. 30 tablet 3  . Cholecalciferol (VITAMIN D-3) 1000 units CAPS Take 1,000 Units by mouth daily.    . clopidogrel (PLAVIX) 75 MG tablet Take 75 mg by mouth daily.    . Coenzyme Q10 (COQ10 PO) Take by mouth.    . Dextrose-Fructose-Sod Citrate (NAUZENE) [id number] MG CHEW Chew 1 Chip by mouth as needed.    . hydrochlorothiazide (MICROZIDE) 12.5 MG capsule Take 12.5 mg by mouth as needed.    . irbesartan (AVAPRO) 150 MG tablet Take 150 mg by mouth daily.     Marland Kitchen. levothyroxine (SYNTHROID, LEVOTHROID) 125 MCG tablet Take 1 tablet by mouth daily.    . Omega-3 Fatty Acids (FISH OIL PO) Take 1,000 mg by mouth.    . ondansetron (ZOFRAN) 4 MG tablet TAKE 1 TABLET BY MOUTH EVERY 8 HOURS AS NEEDED FOR NAUSEA OR VOMITING. 30 tablet 0  . pantoprazole (PROTONIX) 40 MG tablet TAKE 1  TABLET BY MOUTH 2 TIMES DAILY BEFORE A MEAL. 180 tablet 1  . Potassium 99 MG TABS Take by mouth.    . simethicone (MYLICON) 125 MG chewable tablet Chew 125 mg by mouth every 6 (six) hours as needed for flatulence.    . traMADol (ULTRAM) 50 MG tablet Take 25 mg by mouth every 6 (six) hours as needed.     No current facility-administered medications on file prior to visit.    Allergies:   Allergies  Allergen Reactions  . Gluten Meal Nausea And Vomiting    Patient has CELIAC DISEASE  . Wheat  Bran Nausea And Vomiting    Patient has CELIAC DISEASE  . Adhesive [Tape] Other (See Comments)    Patient's skin is VERY THIN and tears very easily; please use either paper tape or Coban wrap  . Codeine Nausea And Vomiting  . Other Other (See Comments)    Patient has Hypoglycemia; her husband stated "when her sugar crashes, it crashes hard."    Physical Exam General: well developed, well nourished pleasant elderly Caucasian lady, seated, in no evident distress Head: head normocephalic and atraumatic.   Neck: supple with no carotid or supraclavicular bruits Cardiovascular: regular rate and rhythm, no murmurs Musculoskeletal: Severe kyphoscoliosis Skin:  no rash/petichiae Vascular:  Normal pulses all extremities  Neurologic Exam Mental Status: Awake and fully alert. Oriented to place and time. Recent and remote memory intact. Attention span, concentration and fund of knowledge appropriate. Mood and affect appropriate.  Diminished recall 1/3.  Able to name only 9 animals which can walk on 4 legs.  Clock drawing 3/4.  Mini-Mental status exam score not done at last visit was 26/30.    Cranial Nerves: Fundoscopic exam reveals sharp disc margins. Pupils equal, briskly reactive to light. Extraocular movements full without nystagmus. Visual fields full to confrontation. Hearing intact. Facial sensation intact. Face, tongue, palate moves normally and symmetrically.  Motor: Normal bulk and tone. Normal  strength in all tested extremity muscles. Sensory.: intact to touch , pinprick , position and vibratory sensation.  Coordination: Rapid alternating movements normal in all extremities. Finger-to-nose and heel-to-shin performed accurately bilaterally. Gait and Station: Arises from chair without difficulty. Stance is normal. Gait demonstrates normal stride length and mild imbalance .  Unsteady while standing on a narrow base and while standing on either foot unsupported.  Not able to heel, toe and tandem walk without difficulty.  Reflexes: 1+ and symmetric. Toes downgoing.    MMSE - Mini Mental State Exam 11/24/2020  Orientation to time 5  Orientation to Place 5  Registration 3  Attention/ Calculation 3  Recall 2  Language- name 2 objects 2  Language- repeat 1  Language- follow 3 step command 3  Language- read & follow direction 1  Write a sentence 1  Copy design 0  Total score 26     ASSESSMENT: 85 year old Caucasian lady with subacute short-term memory and mild cognitive difficulties due to mild cognitive impairment which is age-appropriate.  Prior history of right thalamic stroke in May 2019 due to small vessel disease with vascular risk factors of hypertension, hyperlipidemia and prior stroke     PLAN: I had a long discussion with the patient with regards to her mild memory and cognitive impairment and discussed results of the lab work, EEG and MRI scan and answered questions.  I recommend she try taking Cerefolin NAC to help with her cognitive impairment and discontinue vitamin B-12 tablets.  She was also encouraged to increase participation in cognitively challenging activities like solving crossword puzzles, playing bridge and sodoku.  We also discussed memory compensation strategies.  She will stay on Plavix for stroke prevention and maintain aggressive risk factor modification with strict control of hypertension with blood pressure goal below 130/90, lipids with LDL cholesterol goal  below 70 mg percent and diabetes with hemoglobin A1c goal below 6.5%.  She will return for follow-up in the future in 6 months or call earlier if necessary.  Greater than 50% time during this 25-minute  visit was spent on counseling and coordination of care about transient confusion and memory difficulties and  discussion about mild cognitive impairment and answering questions. Delia Heady, MD  [facility] [address] [place], [place] [address]  Phone [phone] Fax [phone] Note: This document was prepared with digital dictation and possible smart phrase technology. Any transcriptional errors that result from this process are unintentional.

## 2021-02-06 NOTE — Progress Notes (Signed)
Kindly inform the patient that brainwave study was normal

## 2021-02-09 ENCOUNTER — Telehealth: Payer: Self-pay | Admitting: Neurology

## 2021-02-09 NOTE — Telephone Encounter (Signed)
Called the patient to review the EEG results.  Informed the patient that the study was normal and there was nothing that appeared concerning for seizure-like activity.  Patient verbalized understanding.  Patient had no questions at this time.

## 2021-02-09 NOTE — Telephone Encounter (Signed)
-----   Message from Micki Riley, MD sent at 02/06/2021  9:55 AM EDT ----- Joneen Roach inform the patient that brainwave study was normal

## 2021-02-11 ENCOUNTER — Telehealth: Payer: Self-pay | Admitting: Gastroenterology

## 2021-02-11 NOTE — Telephone Encounter (Signed)
Spoke with patient, she states that she lost the information for her upper GI series appt. We have reviewed the instructions and appointment information. Patient verbalized understanding and had no concerns at the end of the call.

## 2021-02-11 NOTE — Telephone Encounter (Signed)
Patient called has questions regarding the [facility] appointment she has for Friday 02/13/21.

## 2021-02-13 ENCOUNTER — Ambulatory Visit (HOSPITAL_COMMUNITY)
Admission: RE | Admit: 2021-02-13 | Discharge: 2021-02-13 | Disposition: A | Discharge status: Home / Self Care | Payer: Medicare Other | Source: Ambulatory Visit | Attending: Gastroenterology | Admitting: Gastroenterology

## 2021-02-13 ENCOUNTER — Other Ambulatory Visit: Payer: Self-pay

## 2021-02-13 DIAGNOSIS — K2100 Gastro-esophageal reflux disease with esophagitis, without bleeding: Secondary | ICD-10-CM | POA: Insufficient documentation

## 2021-02-13 DIAGNOSIS — K449 Diaphragmatic hernia without obstruction or gangrene: Secondary | ICD-10-CM | POA: Diagnosis present

## 2021-02-24 ENCOUNTER — Other Ambulatory Visit: Payer: Self-pay | Admitting: Gastroenterology

## 2021-03-12 ENCOUNTER — Telehealth: Payer: Self-pay | Admitting: Gastroenterology

## 2021-03-12 NOTE — Telephone Encounter (Signed)
Refaxed records to [facility]. Referral has not been scheduled yet. Mrs Rodd has been notified and aware

## 2021-03-12 NOTE — Telephone Encounter (Signed)
Inbound call from patient requesting a call from a nurse please.  Has questions about an appt or referral she was suppose to have.  Please advise.

## 2021-03-19 NOTE — Telephone Encounter (Signed)
I called and checked on the referral status. Pt has not been scheduled yet

## 2021-03-31 ENCOUNTER — Other Ambulatory Visit (HOSPITAL_COMMUNITY): Payer: Self-pay | Admitting: *Deleted

## 2021-04-01 ENCOUNTER — Inpatient Hospital Stay (HOSPITAL_COMMUNITY): Admission: RE | Admit: 2021-04-01 | Payer: Medicare Other | Source: Ambulatory Visit

## 2021-04-02 NOTE — Telephone Encounter (Signed)
Pt has been scheduled to see Dr Doylene Canard 05-08-2021

## 2021-04-14 ENCOUNTER — Other Ambulatory Visit: Payer: Self-pay | Admitting: Neurology

## 2021-05-08 ENCOUNTER — Ambulatory Visit: Payer: Self-pay | Admitting: Surgery

## 2021-05-08 NOTE — H&P (Signed)
Caroline [name] [id number]    Referring Provider:  Dr. Amada Jupiter   Subjective    Chief Complaint: Hernia       History of Present Illness:    85 year old woman who has been followed for a symptomatic paraesophageal hernia by Dr. Myrtie Neither.  Describes episodes of intermittent heartburn and episodes of severe regurgitation.  She has eliminated many things from her diet including coffee, she keeps her head of bed elevated.  Symptoms seem unpredictable despite this.  No dysphagia or odynophagia.  Denies weight loss.  She had an endoscopy in February 2020 confirming grade D reflux esophagitis and again June 2020, with little improvement in endoscopic findings on high-dose PPI and with antireflux measures.  Biopsies negative.  She has had a normal gastric emptying study in 2020.  She has been encouraged to pursue surgical repair of her hiatal hernia for over a year but has been reluctant to do so given her age.   Upper GI 02/13/2021: Moderate hiatal hernia, some delay of passing the barium tablet at the level of the hernia but this did subsequently passed the sips of water.  Moderate reflux, mild esophageal dysmotility and mildly patulous distal thoracic esophagus.         Review of Systems: A complete review of systems was obtained from the patient.  I have reviewed this information and discussed as appropriate with the patient.  See HPI as well for other ROS.     Medical History: Past Medical History      Past Medical History:  Diagnosis Date   Arthritis     DVT (deep venous thrombosis) (CMS-HCC)     Esophageal varices (CMS-HCC)     History of stroke          There is no problem list on file for this patient.     Past Surgical History       Past Surgical History:  Procedure Laterality Date   broken knee surgery N/A     HYSTERECTOMY            Allergies       Allergies  Allergen Reactions   Gluten Nausea And Vomiting      Patient has CELIAC DISEASE Patient has CELIAC  DISEASE     Wheat Bran Nausea And Vomiting      Patient has CELIAC DISEASE   Adhesive Other (See Comments)      Patient's skin is VERY THIN and tears very easily; please use either paper tape or Coban wrap   Adhesive Tape-Silicones Other (See Comments)      Patient's skin is VERY THIN and tears very easily; please use either paper tape or Coban wrap   Codeine Nausea And Vomiting and Other (See Comments)      other     Wheat Other (See Comments)      other              Current Outpatient Medications on File Prior to Visit  Medication Sig Dispense Refill   denosumab (PROLIA) 60 mg/mL inj syringe as directed       ondansetron (ZOFRAN) 4 MG tablet Take 1 tablet by mouth every 8 (eight) hours as needed       alendronate (FOSAMAX) 70 MG tablet TAKE 1 TABLET ONCE WEEKLY 30 MINUTES BEFORE THE FIRST FOOD, BEVERAGE OR MEDICINE OF THE DAY WITH PLAIN WATER       atorvastatin (LIPITOR) 40 MG tablet Take 40 mg by mouth  once daily       azithromycin (ZITHROMAX) 250 MG tablet TAKE 2 TABLETS BY MOUTH ON DAY 1, THEN 1 TABLET DAILY ON DAYS 2 THROUGH 5.       clopidogreL (PLAVIX) 75 mg tablet Take 1 tablet by mouth once daily       irbesartan (AVAPRO) 150 MG tablet Take 1 tablet by mouth once daily       levothyroxine (SYNTHROID) 112 MCG tablet Take 1 tablet by mouth once daily       pantoprazole (PROTONIX) 40 MG DR tablet TAKE 1 TABLET BY MOUTH 2 TIMES DAILY BEFORE A MEAL.        No current facility-administered medications on file prior to visit.      Family History       Family History  Problem Relation Age of Onset   High blood pressure (Hypertension) Mother     Hyperlipidemia (Elevated cholesterol) Mother     Deep vein thrombosis (DVT or abnormal blood clot formation) Mother     High blood pressure (Hypertension) Sister     Hyperlipidemia (Elevated cholesterol) Sister          Social History       Tobacco Use  Smoking Status Never Smoker  Smokeless Tobacco Never Used      Social  History  Social History        Socioeconomic History   Marital status: Unknown  Tobacco Use   Smoking status: Never Smoker   Smokeless tobacco: Never Used  Building services engineer Use: Never used  Substance and Sexual Activity   Alcohol use: Not Currently   Drug use: Never        Objective:         Vitals:    05/08/21 1039  BP: (!) 160/90  Pulse: (!) 111  Temp: (!) 35.8 C (96.5 F)  SpO2: 94%  Weight: 62.9 kg (138 lb 9.6 oz)  Height: 165.1 cm (5\' 5" )    Body mass index is 23.06 kg/m.   Alert, well-appearing Unlabored respirations         Assessment and Plan:  Diagnoses and all orders for this visit:   Paraesophageal hiatal hernia -     [facility] Case Posting Request; Future   Gastroesophageal reflux disease with esophagitis, unspecified whether hemorrhage     I recommend proceeding with robotic paraesophageal hernia repair with toupee fundoplication.  Using a diagram to demonstrate, we went over the relevant anatomy and surgical technique.  Discussed anticipated improvement in reflux symptoms and ability to de-escalate or discontinue reflux medications.  Discussed risks of bleeding, infection, pain, scarring, injury to intra-abdominal or mediastinal structures, dysphagia which may be chronic, nausea or poor gastric emptying, recurrent hernia or problems with the wrap, as well as general cardiovascular/thromboembolic/pulmonary risks.  Discussed the typical postoperative diet progression.  Questions were welcomed and answered to her and her husband satisfaction.  They wish to proceed with scheduling.   Kalley Nicholl , MD

## 2021-05-22 NOTE — Patient Instructions (Addendum)
DUE TO COVID-19 ONLY ONE VISITOR IS ALLOWED TO COME WITH YOU AND STAY IN THE WAITING ROOM ONLY DURING PRE OP AND PROCEDURE.   **NO VISITORS ARE ALLOWED IN THE SHORT STAY AREA OR RECOVERY ROOM!!**  IF YOU WILL BE ADMITTED INTO THE HOSPITAL YOU ARE ALLOWED ONLY TWO SUPPORT PEOPLE DURING VISITATION HOURS ONLY (10AM -8PM)   The support person(s) may change daily. The support person(s) must pass our screening, gel in and out, and wear a mask at all times, including in the patient's room. Patients must also wear a mask when staff or their support person are in the room.  No visitors under the age of 46. Any visitor under the age of 32 must be accompanied by an adult.    COVID SWAB TESTING MUST BE COMPLETED ON:  Thursday, 06-11-21 between the hours of 8 and 3  **MUST PRESENT COMPLETED FORM AT TESTING SITE**    [address] [place] (backside of the building)  You are not required to quarantine, however you are required to wear a well-fitted mask when you are out and around people not in your household.  Hand Hygiene often Do NOT share personal items Notify your provider if you are in close contact with someone who has COVID or you develop fever 100.4 or greater, new onset of sneezing, cough, sore throat, shortness of breath or body aches.        Your procedure is scheduled on:  Monday, 06-15-21   Report to [facility] Main  Entrance   Report to admitting at 7:15 AM   Call this number if you have problems the morning of surgery [phone]   Do not eat food :After Midnight.   May have liquids until 6:30 AM day of surgery  CLEAR LIQUID DIET  Foods Allowed                                                                     Foods Excluded  Water, Black Coffee (no milk/no creamer) and tea, regular and decaf                  liquids that you cannot  Plain Jell-O in any flavor  (No red)                                    see through such as: Fruit ices (not with fruit  pulp)                                            milk, soups, orange juice              Iced Popsicles (No red)                                      All solid food  Apple juices Sports drinks like Gatorade (No red) Lightly seasoned clear broth or consume(fat free) Sugar      Oral Hygiene is also important to reduce your risk of infection.                                    Remember - BRUSH YOUR TEETH THE MORNING OF SURGERY WITH YOUR REGULAR TOOTHPASTE   Do NOT smoke after Midnight  Take these medicines the morning of surgery with A SIP OF WATER:  Atorvastatin, Levothyroxine, Pantoprazole.  Tramadol and Ondansetron if needed.                              You may not have any metal on your body including hair pins, jewelry, and body piercing             Do not wear make-up, lotions, powders, perfumes or deodorant  Do not wear nail polish including gel and S&S, artificial/acrylic nails, or any other type of covering on natural nails including finger and toenails. If you have artificial nails, gel coating, etc. that needs to be removed by a nail salon please have this removed prior to surgery or surgery may need to be canceled/ delayed if the surgeon/ anesthesia feels like they are unable to be safely monitored.   Do not shave  48 hours prior to surgery.         Do not bring valuables to the hospital. [facility] IS NOT  RESPONSIBLE   FOR VALUABLES.   Contacts, dentures or bridgework may not be worn into surgery.   Bring small overnight bag day of surgery.    Please read over the following fact sheets you were given: IF YOU HAVE QUESTIONS ABOUT YOUR PRE OP INSTRUCTIONS PLEASE CALL [phone] [facility] - Preparing for Surgery Before surgery, you can play an important role.  Because skin is not sterile, your skin needs to be as free of germs as possible.  You can reduce the number of germs on your skin by washing with CHG (chlorahexidine  gluconate) soap before surgery.  CHG is an antiseptic cleaner which kills germs and bonds with the skin to continue killing germs even after washing. Please DO NOT use if you have an allergy to CHG or antibacterial soaps.  If your skin becomes reddened/irritated stop using the CHG and inform your nurse when you arrive at Short Stay. Do not shave (including legs and underarms) for at least 48 hours prior to the first CHG shower.  You may shave your face/neck.  Please follow these instructions carefully:  1.  Shower with CHG Soap the night before surgery and the  morning of surgery.  2.  If you choose to wash your hair, wash your hair first as usual with your normal  shampoo.  3.  After you shampoo, rinse your hair and body thoroughly to remove the shampoo.                             4.  Use CHG as you would any other liquid soap.  You can apply chg directly to the skin and wash.  Gently with a scrungie or clean washcloth.  5.  Apply the CHG Soap to your body ONLY FROM THE NECK DOWN.   Do  not use on face/ open                           Wound or open sores. Avoid contact with eyes, ears mouth and   genitals (private parts).                       Wash face,  Genitals (private parts) with your normal soap.             6.  Wash thoroughly, paying special attention to the area where your    surgery  will be performed.  7.  Thoroughly rinse your body with warm water from the neck down.  8.  DO NOT shower/wash with your normal soap after using and rinsing off the CHG Soap.                9.  Pat yourself dry with a clean towel.            10.  Wear clean pajamas.            11.  Place clean sheets on your bed the night of your first shower and do not  sleep with pets. Day of Surgery : Do not apply any lotions/deodorants the morning of surgery.  Please wear clean clothes to the hospital/surgery center.  FAILURE TO FOLLOW THESE INSTRUCTIONS MAY RESULT IN THE CANCELLATION OF YOUR SURGERY  PATIENT  SIGNATURE_________________________________  NURSE SIGNATURE__________________________________  ________________________________________________________________________

## 2021-05-22 NOTE — Progress Notes (Addendum)
  COVID swab appointment:06-11-21  COVID Vaccine Completed:  Yes x3 Date COVID Vaccine completed: 10-29-19, 11-19-19 Has received booster:  Yes x1 09-15-20 COVID vaccine manufacturer: Pfizer       Date of COVID positive in last 90 days:  No  PCP - Guerry Bruin, MD office note on chart Cardiologist -   Chest x-ray - N/A EKG - 05-26-21 Epic Stress Test - N/A ECHO - 02-09-18 Epic Cardiac Cath - N/A Pacemaker/ICD device last checked: Spinal Cord Stimulator:  Sleep Study - N/A CPAP -   Fasting Blood Sugar - N/A Checks Blood Sugar _____ times a day  Blood Thinner Instructions:  Plavix 75 mg.  Patient states to stop 5 days Aspirin Instructions: Last Dose:  Activity level:  Can go up a flight of stairs and perform activities of daily living without stopping and without symptoms of chest pain or shortness of breath.  Patient states that she is up and down her stairs multiple times a day with no issues.       Anesthesia review:   Manual BP 178/76 at PAT, checked manually because 200/69 by automated machine.      Patient states that is has been elevated the last few times that she has checked it at home with systolic numbers in the 160s, diastolic normal.  Denies headache, dizziness or chest pain.   She will continue to monitor and notify PCP if it remains elevated.    Patient denies shortness of breath, fever, cough and chest pain at PAT appointment   Patient verbalized understanding of instructions that were given to them at the PAT appointment. Patient was also instructed that they will need to review over the PAT instructions again at home before surgery.

## 2021-05-26 ENCOUNTER — Other Ambulatory Visit: Payer: Self-pay

## 2021-05-26 ENCOUNTER — Encounter (HOSPITAL_COMMUNITY): Payer: Self-pay

## 2021-05-26 ENCOUNTER — Encounter (HOSPITAL_COMMUNITY)
Admission: RE | Admit: 2021-05-26 | Discharge: 2021-05-26 | Disposition: A | Discharge status: Home / Self Care | Payer: Medicare Other | Source: Ambulatory Visit | Attending: Surgery | Admitting: Surgery

## 2021-05-26 DIAGNOSIS — Z01818 Encounter for other preprocedural examination: Secondary | ICD-10-CM | POA: Insufficient documentation

## 2021-05-26 HISTORY — DX: Other specified postprocedural states: Z98.890

## 2021-05-26 HISTORY — DX: Nausea with vomiting, unspecified: R11.2

## 2021-05-26 LAB — BASIC METABOLIC PANEL
Anion gap: 8 (ref 5–15)
BUN: 26 mg/dL — ABNORMAL HIGH (ref 8–23)
CO2: 23 mmol/L (ref 22–32)
Calcium: 9.1 mg/dL (ref 8.9–10.3)
Chloride: 106 mmol/L (ref 98–111)
Creatinine, Ser: 0.66 mg/dL (ref 0.44–1.00)
GFR, Estimated: >60 mL/min (ref >60)
Glucose, Bld: 89 mg/dL (ref 70–99)
Potassium: 4.6 mmol/L (ref 3.5–5.1)
Sodium: 137 mmol/L (ref 135–145)

## 2021-05-26 LAB — CBC
HCT: 40.1 % (ref 36.0–46.0)
Hemoglobin: 12.4 g/dL (ref 12.0–15.0)
MCH: 27.7 pg (ref 26.0–34.0)
MCHC: 30.9 g/dL (ref 30.0–36.0)
MCV: 89.5 fL (ref 80.0–100.0)
Platelets: 300 10*3/uL (ref 150–400)
RBC: 4.48 MIL/uL (ref 3.87–5.11)
RDW: 16.2 % — ABNORMAL HIGH (ref 11.5–15.5)
WBC: 7 10*3/uL (ref 4.0–10.5)
nRBC: 0 % (ref 0.0–0.2)

## 2021-06-01 ENCOUNTER — Telehealth: Payer: Self-pay | Admitting: Neurology

## 2021-06-01 NOTE — Telephone Encounter (Signed)
Pt called she is having a procedure done on Next Monday. Her surgeon informed her to ask Dr. [name] when she should stop the clopidogrel (PLAVIX) 75 MG tablet and then when should she pick it back up. Pt is requesting a call back.

## 2021-06-01 NOTE — Telephone Encounter (Signed)
I called the patient. She is having a hiatal hernia repair on 06/15/21 (Dr. Twana First). She is asking if it is safe to stop her Plavix 75mg  daily. She would like to know how many days in advance to stop it prior to surgery and when to restart it.

## 2021-06-02 NOTE — Telephone Encounter (Signed)
Left message requesting a return call.

## 2021-06-02 NOTE — Telephone Encounter (Signed)
Spoke to pt relayed message from MD, all questions answered, pt voiced understanding

## 2021-06-11 ENCOUNTER — Other Ambulatory Visit: Payer: Self-pay | Admitting: Surgery

## 2021-06-11 LAB — SARS CORONAVIRUS 2 (TAT 6-24 HRS): SARS Coronavirus 2: NEGATIVE

## 2021-06-15 ENCOUNTER — Other Ambulatory Visit: Payer: Self-pay

## 2021-06-15 ENCOUNTER — Ambulatory Visit (HOSPITAL_COMMUNITY): Payer: Medicare Other | Admitting: Registered Nurse

## 2021-06-15 ENCOUNTER — Encounter (HOSPITAL_COMMUNITY): Payer: Self-pay | Admitting: Surgery

## 2021-06-15 ENCOUNTER — Ambulatory Visit (HOSPITAL_COMMUNITY): Payer: Medicare Other | Admitting: Physician Assistant

## 2021-06-15 ENCOUNTER — Inpatient Hospital Stay (HOSPITAL_COMMUNITY)
Admission: RE | Admit: 2021-06-15 | Discharge: 2021-06-24 | DRG: 326 | Disposition: A | Discharge status: Rehabilitation Facility | Payer: Medicare Other | Source: Ambulatory Visit | Attending: Surgery | Admitting: Surgery

## 2021-06-15 ENCOUNTER — Encounter (HOSPITAL_COMMUNITY)
Admission: RE | Disposition: A | Discharge status: Rehabilitation Facility | Payer: Self-pay | Source: Ambulatory Visit | Attending: Surgery

## 2021-06-15 DIAGNOSIS — F05 Delirium due to known physiological condition: Secondary | ICD-10-CM | POA: Diagnosis present

## 2021-06-15 DIAGNOSIS — Z7902 Long term (current) use of antithrombotics/antiplatelets: Secondary | ICD-10-CM

## 2021-06-15 DIAGNOSIS — R29704 NIHSS score 4: Secondary | ICD-10-CM | POA: Diagnosis not present

## 2021-06-15 DIAGNOSIS — Z79899 Other long term (current) drug therapy: Secondary | ICD-10-CM

## 2021-06-15 DIAGNOSIS — G9341 Metabolic encephalopathy: Secondary | ICD-10-CM | POA: Diagnosis not present

## 2021-06-15 DIAGNOSIS — B9620 Unspecified Escherichia coli [E. coli] as the cause of diseases classified elsewhere: Secondary | ICD-10-CM | POA: Diagnosis not present

## 2021-06-15 DIAGNOSIS — Z880 Allergy status to penicillin: Secondary | ICD-10-CM

## 2021-06-15 DIAGNOSIS — I639 Cerebral infarction, unspecified: Secondary | ICD-10-CM

## 2021-06-15 DIAGNOSIS — Z885 Allergy status to narcotic agent status: Secondary | ICD-10-CM

## 2021-06-15 DIAGNOSIS — E039 Hypothyroidism, unspecified: Secondary | ICD-10-CM | POA: Diagnosis present

## 2021-06-15 DIAGNOSIS — M1990 Unspecified osteoarthritis, unspecified site: Secondary | ICD-10-CM | POA: Diagnosis present

## 2021-06-15 DIAGNOSIS — N390 Urinary tract infection, site not specified: Secondary | ICD-10-CM | POA: Diagnosis not present

## 2021-06-15 DIAGNOSIS — G3184 Mild cognitive impairment, so stated: Secondary | ICD-10-CM | POA: Diagnosis present

## 2021-06-15 DIAGNOSIS — K449 Diaphragmatic hernia without obstruction or gangrene: Principal | ICD-10-CM | POA: Diagnosis present

## 2021-06-15 DIAGNOSIS — H409 Unspecified glaucoma: Secondary | ICD-10-CM | POA: Diagnosis present

## 2021-06-15 DIAGNOSIS — Z8673 Personal history of transient ischemic attack (TIA), and cerebral infarction without residual deficits: Secondary | ICD-10-CM

## 2021-06-15 DIAGNOSIS — I63532 Cerebral infarction due to unspecified occlusion or stenosis of left posterior cerebral artery: Secondary | ICD-10-CM | POA: Diagnosis not present

## 2021-06-15 DIAGNOSIS — Z7983 Long term (current) use of bisphosphonates: Secondary | ICD-10-CM

## 2021-06-15 DIAGNOSIS — Z91048 Other nonmedicinal substance allergy status: Secondary | ICD-10-CM

## 2021-06-15 DIAGNOSIS — M810 Age-related osteoporosis without current pathological fracture: Secondary | ICD-10-CM | POA: Diagnosis present

## 2021-06-15 DIAGNOSIS — Z8719 Personal history of other diseases of the digestive system: Secondary | ICD-10-CM

## 2021-06-15 DIAGNOSIS — Z91018 Allergy to other foods: Secondary | ICD-10-CM

## 2021-06-15 DIAGNOSIS — J302 Other seasonal allergic rhinitis: Secondary | ICD-10-CM | POA: Diagnosis present

## 2021-06-15 DIAGNOSIS — E785 Hyperlipidemia, unspecified: Secondary | ICD-10-CM

## 2021-06-15 DIAGNOSIS — Z86718 Personal history of other venous thrombosis and embolism: Secondary | ICD-10-CM

## 2021-06-15 DIAGNOSIS — Z79890 Hormone replacement therapy (postmenopausal): Secondary | ICD-10-CM

## 2021-06-15 DIAGNOSIS — E0781 Sick-euthyroid syndrome: Secondary | ICD-10-CM | POA: Diagnosis present

## 2021-06-15 DIAGNOSIS — I10 Essential (primary) hypertension: Secondary | ICD-10-CM

## 2021-06-15 DIAGNOSIS — Z90710 Acquired absence of both cervix and uterus: Secondary | ICD-10-CM

## 2021-06-15 HISTORY — PX: XI ROBOTIC ASSISTED PARAESOPHAGEAL HERNIA REPAIR: SHX6871

## 2021-06-15 SURGERY — REPAIR, HERNIA, PARAESOPHAGEAL, ROBOT-ASSISTED
Anesthesia: General | Site: Abdomen

## 2021-06-15 MED ORDER — CHLORHEXIDINE GLUCONATE CLOTH 2 % EX PADS: 6.0000 | MEDICATED_PAD | Freq: Once | CUTANEOUS | Status: DC

## 2021-06-15 MED ORDER — PROPOFOL 10 MG/ML IV BOLUS
INTRAVENOUS | Status: AC
  Filled 2021-06-15: qty 20

## 2021-06-15 MED ORDER — SIMETHICONE 80 MG PO CHEW: 80.0000 mg | CHEWABLE_TABLET | Freq: Four times a day (QID) | ORAL | Status: DC | PRN

## 2021-06-15 MED ORDER — SUGAMMADEX SODIUM 200 MG/2ML IV SOLN
INTRAVENOUS | Status: DC | PRN
  Administered 2021-06-15: 200 mg via INTRAVENOUS

## 2021-06-15 MED ORDER — ACETAMINOPHEN 500 MG PO TABS
1000.0000 mg | ORAL_TABLET | Freq: Four times a day (QID) | ORAL | Status: DC
  Administered 2021-06-15 – 2021-06-17 (×7): 1000 mg via ORAL
  Filled 2021-06-15 (×8): qty 2

## 2021-06-15 MED ORDER — SUCCINYLCHOLINE CHLORIDE 200 MG/10ML IV SOSY
PREFILLED_SYRINGE | INTRAVENOUS | Status: DC | PRN
  Administered 2021-06-15: 120 mg via INTRAVENOUS

## 2021-06-15 MED ORDER — ACETAMINOPHEN 500 MG PO TABS
1000.0000 mg | ORAL_TABLET | ORAL | Status: AC
  Administered 2021-06-15: 1000 mg via ORAL
  Filled 2021-06-15: qty 2

## 2021-06-15 MED ORDER — PANTOPRAZOLE SODIUM 40 MG IV SOLR
40.0000 mg | Freq: Every day | INTRAVENOUS | Status: DC
Start: 2021-06-15 — End: 2021-06-21
  Administered 2021-06-15 – 2021-06-20 (×5): 40 mg via INTRAVENOUS
  Filled 2021-06-15 (×5): qty 40

## 2021-06-15 MED ORDER — SUCCINYLCHOLINE CHLORIDE 200 MG/10ML IV SOSY
PREFILLED_SYRINGE | INTRAVENOUS | Status: AC
  Filled 2021-06-15: qty 10

## 2021-06-15 MED ORDER — DIPHENHYDRAMINE HCL 12.5 MG/5ML PO ELIX: 12.5000 mg | ORAL_SOLUTION | Freq: Four times a day (QID) | ORAL | Status: DC | PRN

## 2021-06-15 MED ORDER — CEFAZOLIN SODIUM-DEXTROSE 2-4 GM/100ML-% IV SOLN
2.0000 g | INTRAVENOUS | Status: AC
  Administered 2021-06-15: 2 g via INTRAVENOUS
  Filled 2021-06-15: qty 100

## 2021-06-15 MED ORDER — KETOROLAC TROMETHAMINE 15 MG/ML IJ SOLN
15.0000 mg | Freq: Four times a day (QID) | INTRAMUSCULAR | Status: DC | PRN
  Administered 2021-06-15 – 2021-06-16 (×3): 15 mg via INTRAVENOUS
  Filled 2021-06-15 (×3): qty 1

## 2021-06-15 MED ORDER — PHENYLEPHRINE 40 MCG/ML (10ML) SYRINGE FOR IV PUSH (FOR BLOOD PRESSURE SUPPORT)
PREFILLED_SYRINGE | INTRAVENOUS | Status: AC
  Filled 2021-06-15: qty 10

## 2021-06-15 MED ORDER — PHENYLEPHRINE HCL (PRESSORS) 10 MG/ML IV SOLN
INTRAVENOUS | Status: AC
  Filled 2021-06-15: qty 1

## 2021-06-15 MED ORDER — PROPOFOL 500 MG/50ML IV EMUL
INTRAVENOUS | Status: AC
  Filled 2021-06-15: qty 50

## 2021-06-15 MED ORDER — LABETALOL HCL 5 MG/ML IV SOLN
INTRAVENOUS | Status: DC | PRN
  Administered 2021-06-15: 2.5 mg via INTRAVENOUS

## 2021-06-15 MED ORDER — SODIUM CHLORIDE 0.9 % IV SOLN: INTRAVENOUS | Status: DC

## 2021-06-15 MED ORDER — HYDRALAZINE HCL 20 MG/ML IJ SOLN
10.0000 mg | INTRAMUSCULAR | Status: DC | PRN
  Administered 2021-06-15 – 2021-06-19 (×2): 10 mg via INTRAVENOUS
  Filled 2021-06-15 (×2): qty 1

## 2021-06-15 MED ORDER — BUPIVACAINE-EPINEPHRINE (PF) 0.25% -1:200000 IJ SOLN
INTRAMUSCULAR | Status: AC
  Filled 2021-06-15: qty 30

## 2021-06-15 MED ORDER — DEXAMETHASONE SODIUM PHOSPHATE 10 MG/ML IJ SOLN
INTRAMUSCULAR | Status: AC
  Filled 2021-06-15: qty 1

## 2021-06-15 MED ORDER — ROCURONIUM BROMIDE 10 MG/ML (PF) SYRINGE
PREFILLED_SYRINGE | INTRAVENOUS | Status: AC
  Filled 2021-06-15: qty 10

## 2021-06-15 MED ORDER — 0.9 % SODIUM CHLORIDE (POUR BTL) OPTIME
TOPICAL | Status: DC | PRN
  Administered 2021-06-15: 1000 mL

## 2021-06-15 MED ORDER — BUPIVACAINE LIPOSOME 1.3 % IJ SUSP
INTRAMUSCULAR | Status: DC | PRN
  Administered 2021-06-15: 20 mL

## 2021-06-15 MED ORDER — EPHEDRINE SULFATE 50 MG/ML IJ SOLN
INTRAMUSCULAR | Status: DC | PRN
  Administered 2021-06-15 (×4): 5 mg via INTRAVENOUS

## 2021-06-15 MED ORDER — DIPHENHYDRAMINE HCL 50 MG/ML IJ SOLN: 12.5000 mg | Freq: Four times a day (QID) | INTRAMUSCULAR | Status: DC | PRN

## 2021-06-15 MED ORDER — BISACODYL 10 MG RE SUPP: 10.0000 mg | Freq: Every day | RECTAL | Status: DC | PRN

## 2021-06-15 MED ORDER — PROPOFOL 1000 MG/100ML IV EMUL
INTRAVENOUS | Status: AC
  Filled 2021-06-15: qty 100

## 2021-06-15 MED ORDER — HYDROMORPHONE HCL 1 MG/ML IJ SOLN
INTRAMUSCULAR | Status: AC
  Filled 2021-06-15: qty 1

## 2021-06-15 MED ORDER — PHENYLEPHRINE HCL-NACL 20-0.9 MG/250ML-% IV SOLN
INTRAVENOUS | Status: DC | PRN
  Administered 2021-06-15: 40 ug/min via INTRAVENOUS

## 2021-06-15 MED ORDER — PROPOFOL 10 MG/ML IV BOLUS
INTRAVENOUS | Status: DC | PRN
Start: 2021-06-15 — End: 2021-06-15
  Administered 2021-06-15: 100 mg via INTRAVENOUS

## 2021-06-15 MED ORDER — CHLORHEXIDINE GLUCONATE 0.12 % MT SOLN
15.0000 mL | Freq: Once | OROMUCOSAL | Status: AC
  Administered 2021-06-15: 15 mL via OROMUCOSAL

## 2021-06-15 MED ORDER — ONDANSETRON HCL 4 MG/2ML IJ SOLN
INTRAMUSCULAR | Status: DC | PRN
  Administered 2021-06-15: 4 mg via INTRAVENOUS

## 2021-06-15 MED ORDER — DEXAMETHASONE SODIUM PHOSPHATE 10 MG/ML IJ SOLN
INTRAMUSCULAR | Status: DC | PRN
  Administered 2021-06-15: 10 mg via INTRAVENOUS

## 2021-06-15 MED ORDER — PROPOFOL 500 MG/50ML IV EMUL
INTRAVENOUS | Status: DC | PRN
  Administered 2021-06-15: 120 ug/kg/min via INTRAVENOUS

## 2021-06-15 MED ORDER — ONDANSETRON HCL 4 MG/2ML IJ SOLN
4.0000 mg | Freq: Four times a day (QID) | INTRAMUSCULAR | Status: DC | PRN
  Filled 2021-06-15: qty 2

## 2021-06-15 MED ORDER — ONDANSETRON HCL 4 MG/2ML IJ SOLN: 4.0000 mg | Freq: Once | INTRAMUSCULAR | Status: DC | PRN

## 2021-06-15 MED ORDER — LABETALOL HCL 5 MG/ML IV SOLN
INTRAVENOUS | Status: AC
  Filled 2021-06-15: qty 4

## 2021-06-15 MED ORDER — ORAL CARE MOUTH RINSE: 15.0000 mL | Freq: Once | OROMUCOSAL | Status: AC

## 2021-06-15 MED ORDER — LACTATED RINGERS IR SOLN
Status: DC | PRN
  Administered 2021-06-15: 1000 mL

## 2021-06-15 MED ORDER — ONDANSETRON 4 MG PO TBDP: 4.0000 mg | ORAL_TABLET | Freq: Four times a day (QID) | ORAL | Status: DC | PRN

## 2021-06-15 MED ORDER — IRBESARTAN 75 MG PO TABS: 75.0000 mg | ORAL_TABLET | Freq: Every day | ORAL | Status: DC

## 2021-06-15 MED ORDER — FENTANYL CITRATE (PF) 100 MCG/2ML IJ SOLN
INTRAMUSCULAR | Status: DC | PRN
  Administered 2021-06-15 (×2): 50 ug via INTRAVENOUS

## 2021-06-15 MED ORDER — TRAMADOL HCL 50 MG PO TABS
50.0000 mg | ORAL_TABLET | Freq: Four times a day (QID) | ORAL | Status: DC | PRN
  Administered 2021-06-15: 50 mg via ORAL
  Filled 2021-06-15: qty 1

## 2021-06-15 MED ORDER — FENTANYL CITRATE PF 50 MCG/ML IJ SOSY: 12.5000 ug | PREFILLED_SYRINGE | INTRAMUSCULAR | Status: DC | PRN

## 2021-06-15 MED ORDER — ROCURONIUM BROMIDE 100 MG/10ML IV SOLN
INTRAVENOUS | Status: DC | PRN
  Administered 2021-06-15: 10 mg via INTRAVENOUS
  Administered 2021-06-15: 40 mg via INTRAVENOUS
  Administered 2021-06-15 (×2): 10 mg via INTRAVENOUS

## 2021-06-15 MED ORDER — DOCUSATE SODIUM 100 MG PO CAPS
100.0000 mg | ORAL_CAPSULE | Freq: Two times a day (BID) | ORAL | Status: DC
  Administered 2021-06-15 – 2021-06-24 (×18): 100 mg via ORAL
  Filled 2021-06-15 (×18): qty 1

## 2021-06-15 MED ORDER — METOPROLOL TARTRATE 5 MG/5ML IV SOLN
5.0000 mg | Freq: Four times a day (QID) | INTRAVENOUS | Status: DC | PRN
Start: 2021-06-15 — End: 2021-06-24

## 2021-06-15 MED ORDER — DROPERIDOL 2.5 MG/ML IJ SOLN
INTRAMUSCULAR | Status: DC | PRN
  Administered 2021-06-15: .625 mg via INTRAVENOUS

## 2021-06-15 MED ORDER — BUPIVACAINE LIPOSOME 1.3 % IJ SUSP: 20.0000 mL | Freq: Once | INTRAMUSCULAR | Status: DC

## 2021-06-15 MED ORDER — METOCLOPRAMIDE HCL 5 MG/ML IJ SOLN
10.0000 mg | Freq: Four times a day (QID) | INTRAMUSCULAR | Status: DC
  Administered 2021-06-15 – 2021-06-17 (×7): 10 mg via INTRAVENOUS
  Filled 2021-06-15 (×8): qty 2

## 2021-06-15 MED ORDER — DROPERIDOL 2.5 MG/ML IJ SOLN
INTRAMUSCULAR | Status: AC
  Filled 2021-06-15: qty 2

## 2021-06-15 MED ORDER — FENTANYL CITRATE (PF) 100 MCG/2ML IJ SOLN
INTRAMUSCULAR | Status: AC
  Filled 2021-06-15: qty 2

## 2021-06-15 MED ORDER — EPHEDRINE 5 MG/ML INJ
INTRAVENOUS | Status: AC
  Filled 2021-06-15: qty 5

## 2021-06-15 MED ORDER — BUPIVACAINE-EPINEPHRINE 0.25% -1:200000 IJ SOLN
INTRAMUSCULAR | Status: DC | PRN
  Administered 2021-06-15: 30 mL

## 2021-06-15 MED ORDER — LIDOCAINE HCL (CARDIAC) PF 100 MG/5ML IV SOSY
PREFILLED_SYRINGE | INTRAVENOUS | Status: DC | PRN
  Administered 2021-06-15: 100 mg via INTRAVENOUS

## 2021-06-15 MED ORDER — BUPIVACAINE LIPOSOME 1.3 % IJ SUSP
INTRAMUSCULAR | Status: AC
  Filled 2021-06-15: qty 20

## 2021-06-15 MED ORDER — ENOXAPARIN SODIUM 40 MG/0.4ML IJ SOSY
40.0000 mg | PREFILLED_SYRINGE | INTRAMUSCULAR | Status: DC
  Administered 2021-06-16 – 2021-06-24 (×9): 40 mg via SUBCUTANEOUS
  Filled 2021-06-15 (×9): qty 0.4

## 2021-06-15 MED ORDER — DEXMEDETOMIDINE (PRECEDEX) IN NS 20 MCG/5ML (4 MCG/ML) IV SYRINGE
PREFILLED_SYRINGE | INTRAVENOUS | Status: AC
  Filled 2021-06-15: qty 5

## 2021-06-15 MED ORDER — LEVOTHYROXINE SODIUM 112 MCG PO TABS
112.0000 ug | ORAL_TABLET | Freq: Every day | ORAL | Status: DC
  Administered 2021-06-16 – 2021-06-22 (×7): 112 ug via ORAL
  Filled 2021-06-15 (×7): qty 1

## 2021-06-15 MED ORDER — ONDANSETRON HCL 4 MG/2ML IJ SOLN
INTRAMUSCULAR | Status: AC
  Filled 2021-06-15: qty 2

## 2021-06-15 MED ORDER — PHENYLEPHRINE 40 MCG/ML (10ML) SYRINGE FOR IV PUSH (FOR BLOOD PRESSURE SUPPORT)
PREFILLED_SYRINGE | INTRAVENOUS | Status: DC | PRN
  Administered 2021-06-15 (×3): 80 ug via INTRAVENOUS
  Administered 2021-06-15: 40 ug via INTRAVENOUS
  Administered 2021-06-15 (×3): 80 ug via INTRAVENOUS

## 2021-06-15 MED ORDER — METHOCARBAMOL 500 MG PO TABS: 500.0000 mg | ORAL_TABLET | Freq: Four times a day (QID) | ORAL | Status: DC | PRN

## 2021-06-15 MED ORDER — LACTATED RINGERS IV SOLN: INTRAVENOUS | Status: DC

## 2021-06-15 MED ORDER — HYDROMORPHONE HCL 1 MG/ML IJ SOLN
0.2500 mg | INTRAMUSCULAR | Status: DC | PRN
  Administered 2021-06-15: 0.5 mg via INTRAVENOUS

## 2021-06-15 MED ORDER — IRBESARTAN 150 MG PO TABS: 150.0000 mg | ORAL_TABLET | Freq: Every day | ORAL | Status: DC

## 2021-06-15 MED ORDER — LIDOCAINE 2% (20 MG/ML) 5 ML SYRINGE
INTRAMUSCULAR | Status: AC
  Filled 2021-06-15: qty 5

## 2021-06-15 SURGICAL SUPPLY — 63 items
ADH SKN CLS APL DERMABOND .7 (GAUZE/BANDAGES/DRESSINGS) ×1
APL PRP STRL LF DISP 70% ISPRP (MISCELLANEOUS) ×1
APPLIER CLIP 5 13 M/L LIGAMAX5 (MISCELLANEOUS)
APPLIER CLIP ROT 10 11.4 M/L (STAPLE)
APR CLP MED LRG 11.4X10 (STAPLE)
APR CLP MED LRG 5 ANG JAW (MISCELLANEOUS)
BAG COUNTER SPONGE SURGICOUNT (BAG) IMPLANT
BAG SPNG CNTER NS LX DISP (BAG)
BLADE SURG SZ11 CARB STEEL (BLADE) ×2 IMPLANT
CHLORAPREP W/TINT 26 (MISCELLANEOUS) ×2 IMPLANT
CLIP APPLIE 5 13 M/L LIGAMAX5 (MISCELLANEOUS) IMPLANT
CLIP APPLIE ROT 10 11.4 M/L (STAPLE) IMPLANT
COVER SURGICAL LIGHT HANDLE (MISCELLANEOUS) ×2 IMPLANT
COVER TIP SHEARS 8 DVNC (MISCELLANEOUS) IMPLANT
COVER TIP SHEARS 8MM DA VINCI (MISCELLANEOUS)
DECANTER SPIKE VIAL GLASS SM (MISCELLANEOUS) IMPLANT
DERMABOND ADVANCED (GAUZE/BANDAGES/DRESSINGS) ×1
DERMABOND ADVANCED .7 DNX12 (GAUZE/BANDAGES/DRESSINGS) ×1 IMPLANT
DRAIN PENROSE 0.5X18 (DRAIN) IMPLANT
DRAPE ARM DVNC X/XI (DISPOSABLE) ×4 IMPLANT
DRAPE COLUMN DVNC XI (DISPOSABLE) ×1 IMPLANT
DRAPE DA VINCI XI ARM (DISPOSABLE) ×8
DRAPE DA VINCI XI COLUMN (DISPOSABLE) ×2
ELECT REM PT RETURN 15FT ADLT (MISCELLANEOUS) ×2 IMPLANT
ENDOLOOP SUT PDS II  0 18 (SUTURE)
ENDOLOOP SUT PDS II 0 18 (SUTURE) IMPLANT
GAUZE 4X4 16PLY ~~LOC~~+RFID DBL (SPONGE) ×2 IMPLANT
GLOVE SURG ENC MOIS LTX SZ6 (GLOVE) ×6 IMPLANT
GLOVE SURG MICRO LTX SZ6 (GLOVE) ×2 IMPLANT
GLOVE SURG UNDER LTX SZ6.5 (GLOVE) ×6 IMPLANT
GOWN STRL REUS W/TWL LRG LVL3 (GOWN DISPOSABLE) ×8 IMPLANT
GOWN STRL REUS W/TWL XL LVL3 (GOWN DISPOSABLE) IMPLANT
KIT BASIN OR (CUSTOM PROCEDURE TRAY) ×2 IMPLANT
LUBRICANT JELLY K Y 4OZ (MISCELLANEOUS) IMPLANT
MARKER SKIN DUAL TIP RULER LAB (MISCELLANEOUS) ×2 IMPLANT
NEEDLE HYPO 22GX1.5 SAFETY (NEEDLE) ×2 IMPLANT
NEEDLE INSUFFLATION 14GA 120MM (NEEDLE) ×2 IMPLANT
PACK CARDIOVASCULAR III (CUSTOM PROCEDURE TRAY) ×2 IMPLANT
PAD POSITIONING PINK XL (MISCELLANEOUS) ×2 IMPLANT
SCISSORS LAP 5X35 DISP (ENDOMECHANICALS) IMPLANT
SEAL CANN UNIV 5-8 DVNC XI (MISCELLANEOUS) ×4 IMPLANT
SEAL XI 5MM-8MM UNIVERSAL (MISCELLANEOUS) ×8
SEALER VESSEL DA VINCI XI (MISCELLANEOUS) ×2
SEALER VESSEL EXT DVNC XI (MISCELLANEOUS) ×1 IMPLANT
SET IRRIG TUBING LAPAROSCOPIC (IRRIGATION / IRRIGATOR) ×2 IMPLANT
SOL ANTI FOG 6CC (MISCELLANEOUS) ×1 IMPLANT
SOLUTION ANTI FOG 6CC (MISCELLANEOUS) ×1
SOLUTION ELECTROLUBE (MISCELLANEOUS) ×2 IMPLANT
SUT ETHIBOND 0 36 GRN (SUTURE) ×4 IMPLANT
SUT MNCRL AB 4-0 PS2 18 (SUTURE) ×2 IMPLANT
SUT SILK 0 SH 30 (SUTURE) IMPLANT
SUT SILK 2 0 SH (SUTURE) IMPLANT
SYR 20ML LL LF (SYRINGE) ×2 IMPLANT
TIP INNERVISION DETACH 40FR (MISCELLANEOUS) IMPLANT
TIP INNERVISION DETACH 50FR (MISCELLANEOUS) IMPLANT
TIP INNERVISION DETACH 56FR (MISCELLANEOUS) IMPLANT
TIPS INNERVISION DETACH 40FR (MISCELLANEOUS)
TOWEL OR 17X26 10 PK STRL BLUE (TOWEL DISPOSABLE) ×2 IMPLANT
TOWEL OR NON WOVEN STRL DISP B (DISPOSABLE) ×2 IMPLANT
TRAY FOLEY MTR SLVR 14FR STAT (SET/KITS/TRAYS/PACK) ×2 IMPLANT
TRAY FOLEY MTR SLVR 16FR STAT (SET/KITS/TRAYS/PACK) IMPLANT
TROCAR ADV FIXATION 5X100MM (TROCAR) ×2 IMPLANT
TUBING INSUFFLATION 10FT LAP (TUBING) ×2 IMPLANT

## 2021-06-15 NOTE — Op Note (Signed)
Operative Note  Marijke Guadiana  [id number]  [id number]  06/15/2021   Surgeon: Phylliss Blakes MD   Assistant: Gaynelle Adu MD   Procedure performed: xi robotic paraesophageal hernia repair, gastropexy, upper endoscopy   Preop diagnosis: paraesophageal hernia Post-op diagnosis/intraop findings: same   Specimens: none Retained items: none  EBL: minimal cc Complications: none   Description of procedure: After obtaining informed consent the patient was taken to the operating room and placed supine on operating room table where general endotracheal anesthesia was initiated, preoperative antibiotics were administered, SCDs applied, and a formal timeout was performed.  A Foley catheter was inserted which is removed at the end of the case.  The abdomen was prepped and draped in the usual sterile fashion. The peritoneal cavity was entered with a left subcostal veress needle and insufflated to .  An 8 mm robotic trocar was then placed to the left of the umbilicus.  Gross inspection revealed no evidence of injury from entry. Bilateral TAPS blocks were performed under laparoscopic visualization using exparel mixed with quarter percent Marcaine and epinephrine. Under direct visualization, 3 additional 8 mm robotic trochars and one 5 mm laparoscopic trocar were placed. A subxiphoid incision was made and the liver retractor introduced for fixed retraction of the left lobe.  The robot was then docked and all instruments inserted under direct visualization.   The hernia sac was dissected out of the mediastinum beginning anteriorly and progressing along the right and left crus circumferentially until the sac had been completely reduced.  The short gastrics were divided using the vessel sealer.  The distal esophagus was somewhat patulous and some of the sac was densely adherent to this. The sac which was chronically thickened was then excised staying well away from the esophagus and stomach.  The esophagus was then  carefully mobilized with a combination of vessel sealer and blunt dissection to free it from its mediastinal attachments.  The vagus nerves were visualized and protected.  When maximal mediastinal dissection had been achieved, she was noted to have a somewhat foreshortened and patulous esophagus with the gastroesophageal junction just a couple centimeters below the hiatus.  At this point an upper endoscopy was performed to confirm the anatomy which was as expected, although reflux changes extended several centimeters up of the distal esophagus.  There was no evidence on endoscopy of injury to the esophagus from her dissection.   The crura were closed with 5 simple interrupted 0 Ethibonds posteriorly.  Endoscopy was repeated and the closure was easily traversable but appropriately snug around the esophagus.  Given her foreshortened and patulous esophagus as well as her age, a gastropexy was then performed with 2 simple interrupted 0 Ethibonds suturing the cardia to the left crus.   The abdomen was again inspected and confirmed to be free of injury and hemostatic. The liver retractor was removed under direct visualization. The abdomen was then desufflated and all trochars removed. Our skin incisions were closed with subcuticular 4-0 Monocryl after infiltrating with the remaining local. Dermabond was then applied. The patient was then awakened, extubated and taken to PACU in stable condition.    All counts were correct at the completion of the case.

## 2021-06-15 NOTE — Anesthesia Preprocedure Evaluation (Signed)
Anesthesia Evaluation  Patient identified by MRN, date of birth, ID band Patient awake    Reviewed: Allergy & Precautions, NPO status , Patient's Chart, lab work & pertinent test results  History of Anesthesia Complications (+) PONV  Airway Mallampati: III  TM Distance: <3 FB Neck ROM: Full    Dental no notable dental hx.    Pulmonary neg pulmonary ROS,    Pulmonary exam normal breath sounds clear to auscultation       Cardiovascular hypertension, Normal cardiovascular exam Rhythm:Regular Rate:Normal     Neuro/Psych Anxiety CVA, No Residual Symptoms negative psych ROS   GI/Hepatic Neg liver ROS, GERD  Medicated,  Endo/Other  Hypothyroidism   Renal/GU negative Renal ROS  negative genitourinary   Musculoskeletal negative musculoskeletal ROS (+)   Abdominal   Peds negative pediatric ROS (+)  Hematology negative hematology ROS (+)   Anesthesia Other Findings   Reproductive/Obstetrics negative OB ROS                             Anesthesia Physical Anesthesia Plan  ASA: 3  Anesthesia Plan: General   Post-op Pain Management:    Induction: Intravenous  PONV Risk Score and Plan: 4 or greater and Ondansetron, Dexamethasone, Treatment may vary due to age or medical condition and Droperidol  Airway Management Planned: Oral ETT  Additional Equipment:   Intra-op Plan:   Post-operative Plan: Extubation in OR  Informed Consent: I have reviewed the patients History and Physical, chart, labs and discussed the procedure including the risks, benefits and alternatives for the proposed anesthesia with the patient or authorized representative who has indicated his/her understanding and acceptance.     Dental advisory given  Plan Discussed with: CRNA and Surgeon  Anesthesia Plan Comments:         Anesthesia Quick Evaluation

## 2021-06-15 NOTE — Anesthesia Procedure Notes (Signed)
Procedure Name: Intubation Date/Time: 06/15/2021 7:38 AM Performed by: Hedda Slade, CRNA Pre-anesthesia Checklist: Patient identified, Patient being monitored, Timeout performed, Emergency Drugs available and Suction available Patient Re-evaluated:Patient Re-evaluated prior to induction Oxygen Delivery Method: Circle system utilized Preoxygenation: Pre-oxygenation with 100% oxygen Induction Type: IV induction Ventilation: Mask ventilation without difficulty Laryngoscope Size: Mac and 3 Grade View: Grade I Tube type: Oral Tube size: 7.0 mm Number of attempts: 1 Airway Equipment and Method: Stylet Placement Confirmation: ETT inserted through vocal cords under direct vision, positive ETCO2 and breath sounds checked- equal and bilateral Secured at: 21 cm Tube secured with: Tape Dental Injury: Teeth and Oropharynx as per pre-operative assessment

## 2021-06-15 NOTE — Anesthesia Postprocedure Evaluation (Signed)
Anesthesia Post Note  Patient: Caroline [name]  Procedure(s) Performed: XI ROBOTIC ASSISTED PARAESOPHAGEAL HERNIA REPAIR, GASTROPEXY, UPPER ENDOSCOPY (Abdomen)     Patient location during evaluation: PACU Anesthesia Type: General Level of consciousness: awake and alert Pain management: pain level controlled Vital Signs Assessment: post-procedure vital signs reviewed and stable Respiratory status: spontaneous breathing, nonlabored ventilation, respiratory function stable and patient connected to nasal cannula oxygen Cardiovascular status: blood pressure returned to baseline and stable Postop Assessment: no apparent nausea or vomiting Anesthetic complications: no   No notable events documented.  Last Vitals:  Vitals:   06/15/21 1115 06/15/21 1130  BP: (!) 161/67 (!) 151/59  Pulse: 79 72  Resp: 20 (!) 23  Temp:    SpO2: 96% 99%    Last Pain:  Vitals:   06/15/21 1130  TempSrc:   PainSc: Asleep                 Chaunice Obie S

## 2021-06-15 NOTE — Transfer of Care (Signed)
Immediate Anesthesia Transfer of Care Note  Patient: Caroline [name]  Procedure(s) Performed: XI ROBOTIC ASSISTED PARAESOPHAGEAL HERNIA REPAIR, GASTROPEXY, UPPER ENDOSCOPY (Abdomen)  Patient Location: PACU  Anesthesia Type:General  Level of Consciousness: sedated  Airway & Oxygen Therapy: Patient Spontanous Breathing and Patient connected to face mask oxygen  Post-op Assessment: Report given to RN and Post -op Vital signs reviewed and stable  Post vital signs: Reviewed and stable  Last Vitals:  Vitals Value Taken Time  BP 153/65 06/15/21 1041  Temp    Pulse 73 06/15/21 1043  Resp 16 06/15/21 1043  SpO2 100 % 06/15/21 1043  Vitals shown include unvalidated device data.  Last Pain:  Vitals:   06/15/21 0633  TempSrc:   PainSc: 0-No pain         Complications: No notable events documented.

## 2021-06-15 NOTE — H&P (Signed)
Caroline [name] [id number]    Referring Provider:  Dr. Amada Jupiter   Subjective    Chief Complaint: Hernia       History of Present Illness:    85 year old woman who has been followed for a symptomatic paraesophageal hernia by Dr. Myrtie Neither.  Describes episodes of intermittent heartburn and episodes of severe regurgitation.  She has eliminated many things from her diet including coffee, she keeps her head of bed elevated.  Symptoms seem unpredictable despite this.  No dysphagia or odynophagia.  Denies weight loss.  She had an endoscopy in February 2020 confirming grade D reflux esophagitis and again June 2020, with little improvement in endoscopic findings on high-dose PPI and with antireflux measures.  Biopsies negative.  She has had a normal gastric emptying study in 2020.  She has been encouraged to pursue surgical repair of her hiatal hernia for over a year but has been reluctant to do so given her age.   Upper GI 02/13/2021: Moderate hiatal hernia, some delay of passing the barium tablet at the level of the hernia but this did subsequently passed the sips of water.  Moderate reflux, mild esophageal dysmotility and mildly patulous distal thoracic esophagus.         Review of Systems: A complete review of systems was obtained from the patient.  I have reviewed this information and discussed as appropriate with the patient.  See HPI as well for other ROS.     Medical History: Past Medical History         Past Medical History:  Diagnosis Date   Arthritis     DVT (deep venous thrombosis) (CMS-HCC)     Esophageal varices (CMS-HCC)     History of stroke          There is no problem list on file for this patient.     Past Surgical History           Past Surgical History:  Procedure Laterality Date   broken knee surgery N/A     HYSTERECTOMY            Allergies           Allergies  Allergen Reactions   Gluten Nausea And Vomiting      Patient has CELIAC DISEASE Patient has CELIAC  DISEASE     Wheat Bran Nausea And Vomiting      Patient has CELIAC DISEASE   Adhesive Other (See Comments)      Patient's skin is VERY THIN and tears very easily; please use either paper tape or Coban wrap   Adhesive Tape-Silicones Other (See Comments)      Patient's skin is VERY THIN and tears very easily; please use either paper tape or Coban wrap   Codeine Nausea And Vomiting and Other (See Comments)      other     Wheat Other (See Comments)      other                   Current Outpatient Medications on File Prior to Visit  Medication Sig Dispense Refill   denosumab (PROLIA) 60 mg/mL inj syringe as directed       ondansetron (ZOFRAN) 4 MG tablet Take 1 tablet by mouth every 8 (eight) hours as needed       alendronate (FOSAMAX) 70 MG tablet TAKE 1 TABLET ONCE WEEKLY 30 MINUTES BEFORE THE FIRST FOOD, BEVERAGE OR MEDICINE OF THE DAY WITH PLAIN WATER  atorvastatin (LIPITOR) 40 MG tablet Take 40 mg by mouth once daily       azithromycin (ZITHROMAX) 250 MG tablet TAKE 2 TABLETS BY MOUTH ON DAY 1, THEN 1 TABLET DAILY ON DAYS 2 THROUGH 5.       clopidogreL (PLAVIX) 75 mg tablet Take 1 tablet by mouth once daily       irbesartan (AVAPRO) 150 MG tablet Take 1 tablet by mouth once daily       levothyroxine (SYNTHROID) 112 MCG tablet Take 1 tablet by mouth once daily       pantoprazole (PROTONIX) 40 MG DR tablet TAKE 1 TABLET BY MOUTH 2 TIMES DAILY BEFORE A MEAL.        No current facility-administered medications on file prior to visit.      Family History           Family History  Problem Relation Age of Onset   High blood pressure (Hypertension) Mother     Hyperlipidemia (Elevated cholesterol) Mother     Deep vein thrombosis (DVT or abnormal blood clot formation) Mother     High blood pressure (Hypertension) Sister     Hyperlipidemia (Elevated cholesterol) Sister          Social History         Tobacco Use  Smoking Status Never Smoker  Smokeless Tobacco Never Used       Social History  Social History           Socioeconomic History   Marital status: Unknown  Tobacco Use   Smoking status: Never Smoker   Smokeless tobacco: Never Used  Building services engineer Use: Never used  Substance and Sexual Activity   Alcohol use: Not Currently   Drug use: Never        Objective:           Vitals:    05/08/21 1039  BP: (!) 160/90  Pulse: (!) 111  Temp: (!) 35.8 C (96.5 F)  SpO2: 94%  Weight: 62.9 kg (138 lb 9.6 oz)  Height: 165.1 cm (5\' 5" )    Body mass index is 23.06 kg/m.   Alert, well-appearing Unlabored respirations         Assessment and Plan:  Diagnoses and all orders for this visit:   Paraesophageal hiatal hernia -     CCS Case Posting Request; Future   Gastroesophageal reflux disease with esophagitis, unspecified whether hemorrhage     I recommend proceeding with robotic paraesophageal hernia repair with toupee fundoplication.  Using a diagram to demonstrate, we went over the relevant anatomy and surgical technique.  Discussed anticipated improvement in reflux symptoms and ability to de-escalate or discontinue reflux medications.  Discussed risks of bleeding, infection, pain, scarring, injury to intra-abdominal or mediastinal structures, dysphagia which may be chronic, nausea or poor gastric emptying, recurrent hernia or problems with the wrap, as well as general cardiovascular/thromboembolic/pulmonary risks.  Discussed the typical postoperative diet progression.  Questions were welcomed and answered to her and her husband satisfaction.  They wish to proceed with scheduling.   , MD

## 2021-06-15 NOTE — Plan of Care (Signed)
  Problem: Clinical Measurements: Goal: Will remain free from infection Outcome: Progressing Goal: Diagnostic test results will improve Outcome: Progressing   

## 2021-06-15 NOTE — Progress Notes (Addendum)
Patient unable to ambulate in the hallway at this time due to difficulty seeing out of her right eye and from still being lethargic from the anesthesia. Patient resting comfortably in her bed with call light within reach.

## 2021-06-16 ENCOUNTER — Ambulatory Visit (HOSPITAL_COMMUNITY): Payer: Medicare Other

## 2021-06-16 ENCOUNTER — Encounter (HOSPITAL_COMMUNITY): Payer: Self-pay | Admitting: Surgery

## 2021-06-16 LAB — BASIC METABOLIC PANEL
Anion gap: 6 (ref 5–15)
BUN: 14 mg/dL (ref 8–23)
CO2: 25 mmol/L (ref 22–32)
Calcium: 8.8 mg/dL — ABNORMAL LOW (ref 8.9–10.3)
Chloride: 103 mmol/L (ref 98–111)
Creatinine, Ser: 0.6 mg/dL (ref 0.44–1.00)
GFR, Estimated: >60 mL/min (ref >60)
Glucose, Bld: 106 mg/dL — ABNORMAL HIGH (ref 70–99)
Potassium: 3.8 mmol/L (ref 3.5–5.1)
Sodium: 134 mmol/L — ABNORMAL LOW (ref 135–145)

## 2021-06-16 LAB — CBC
HCT: 33.8 % — ABNORMAL LOW (ref 36.0–46.0)
Hemoglobin: 11 g/dL — ABNORMAL LOW (ref 12.0–15.0)
MCH: 28.2 pg (ref 26.0–34.0)
MCHC: 32.5 g/dL (ref 30.0–36.0)
MCV: 86.7 fL (ref 80.0–100.0)
Platelets: 274 10*3/uL (ref 150–400)
RBC: 3.9 MIL/uL (ref 3.87–5.11)
RDW: 15.6 % — ABNORMAL HIGH (ref 11.5–15.5)
WBC: 11.8 10*3/uL — ABNORMAL HIGH (ref 4.0–10.5)
nRBC: 0 % (ref 0.0–0.2)

## 2021-06-16 LAB — MAGNESIUM: Magnesium: 2.2 mg/dL (ref 1.7–2.4)

## 2021-06-16 MED ORDER — QUETIAPINE FUMARATE ER 50 MG PO TB24
100.0000 mg | ORAL_TABLET | Freq: Every day | ORAL | Status: DC
  Administered 2021-06-16: 100 mg via ORAL
  Filled 2021-06-16 (×2): qty 2

## 2021-06-16 MED ORDER — IOHEXOL 300 MG/ML  SOLN
50.0000 mL | Freq: Once | INTRAMUSCULAR | Status: AC | PRN
  Administered 2021-06-16: 50 mL via ORAL

## 2021-06-16 MED ORDER — IRBESARTAN 75 MG PO TABS
75.0000 mg | ORAL_TABLET | Freq: Every day | ORAL | Status: DC
  Administered 2021-06-16 – 2021-06-19 (×4): 75 mg via ORAL
  Filled 2021-06-16 (×4): qty 1

## 2021-06-16 MED ORDER — CLOPIDOGREL BISULFATE 75 MG PO TABS
75.0000 mg | ORAL_TABLET | Freq: Every day | ORAL | Status: DC
  Administered 2021-06-17 – 2021-06-24 (×8): 75 mg via ORAL
  Filled 2021-06-16 (×8): qty 1

## 2021-06-16 NOTE — Progress Notes (Signed)
Patient constantly getting out of bed trying to find her " cats", patient was reoriented but cannot redirect. Paged on call provider. We will continue to evaluate.

## 2021-06-16 NOTE — Progress Notes (Signed)
S: Patient reports an uneventful night.  She does not remember if she had any pain, denies any nausea.  She has been tolerating liquids without difficulty.  Nursing reports that she had some confusion overnight.   O: Vitals, labs, intake/output, and orders reviewed at this time.  T-max 98.6.  Heart rate 62-87, respirations 16, saturating 97% on room air.  Systolic blood pressure initially elevated when she came in before surgery as well as immediately following surgery yesterday but has been normotensive throughout the night, currently 121/48.  P.o. intake 510, urine output 900 +8 occurrences.  BMP unremarkable.  WBC 11.8 (7 preop), hemoglobin 11 (12.4), platelets 274 (300). She got 1 dose of tramadol at 10 PM last night but otherwise no narcotics since PACU.   Gen: She is sleeping but awakens easily to voice, oriented to self and situation, but not able to remember much from overnight. H&N: EOMI, atraumatic, neck supple.  She does have some subcutaneous emphysema along her right side of her face which is improved compared to yesterday in PACU. Chest: unlabored respirations, RRR Abd: soft, nontender, nondistended, incision(s) c/d/i with Dermabond, no cellulitis or hematoma Ext: warm, no edema Neuro: grossly normal  Lines/tubes/drains: piv  A/P: Postop day 1 status post robotic repair of paraesophageal hernia with gastropexy -Subcutaneous emphysema is common and will continue to resolve on its own -Blood pressure is normal today, will half today's dose of irbesartan -Stop IV fluids -Stop narcotics and any other confusion inducing medications, sundowning precautions (keep patient awake, blinds open, TV on, lights on etc. during the day and minimize interruptions to sleep at night) -Upper GI this morning, advance to pured diet after this  If confusion improving and upper GI excessively, possible discharge this afternoon.   Phylliss Blakes, MD [facility], [place]

## 2021-06-16 NOTE — Progress Notes (Signed)
Patient reassessed this afternoon.  Upper GI as expected.  Tolerating liquids without difficulty but not much of an appetite.  Still somewhat sleepy but confusion is improved.  She did have a headache which is now resolved.  Denies any abdominal pain, nausea or dysphagia.  Her daughter is at the bedside and I spoke to her husband by phone.  Her daughter notes that she did have some increasing forgetfulness and such at home before surgery and she is prone to headaches.  Of note she did have a minor stroke many years ago.  Afebrile, no tachycardia, respirations 16, normo to mildly hypertensive, sats 96% on room air Alert, answering questions appropriately.  Cranial nerves II-XII intact.  Strength symmetrical to bilateral upper and lower extremities, no motor or sensory deficits. Subcu air in the right side of the face has resolved. Unlabored respirations, abdomen is benign   She is slowly improving, postop day 1 status post robotic paraesophageal hernia repair with gastropexy.  Suspect that she has some low-grade underlying dementia which has been exacerbated by undergoing general anesthesia.  This seems to be improving.  Based on her exam I have no concern at this time for stroke, but if anything changes or mental status worsens would proceed with CT head.  Will observe for another night and hopefully will have continued improvement in her mental status.  Please keep lights on, blinds open, TV on, and keep patient engaged and awake during the day.  Please keep blinds shut, lights off, TV off, and minimize interruptions to sleep from 9 PM to 6 AM.  No narcotics or other mental status altering medications are on her MAR at this time and NONE should be ordered.  She is not having any pain or nausea.  If continued improvement as anticipated, will plan discharge tomorrow

## 2021-06-17 DIAGNOSIS — Z8719 Personal history of other diseases of the digestive system: Secondary | ICD-10-CM | POA: Diagnosis not present

## 2021-06-17 DIAGNOSIS — J189 Pneumonia, unspecified organism: Secondary | ICD-10-CM | POA: Diagnosis not present

## 2021-06-17 DIAGNOSIS — I6622 Occlusion and stenosis of left posterior cerebral artery: Secondary | ICD-10-CM | POA: Diagnosis not present

## 2021-06-17 DIAGNOSIS — F05 Delirium due to known physiological condition: Secondary | ICD-10-CM | POA: Diagnosis present

## 2021-06-17 DIAGNOSIS — J302 Other seasonal allergic rhinitis: Secondary | ICD-10-CM | POA: Diagnosis not present

## 2021-06-17 DIAGNOSIS — G9341 Metabolic encephalopathy: Secondary | ICD-10-CM | POA: Diagnosis not present

## 2021-06-17 DIAGNOSIS — Z7902 Long term (current) use of antithrombotics/antiplatelets: Secondary | ICD-10-CM | POA: Diagnosis not present

## 2021-06-17 DIAGNOSIS — R29704 NIHSS score 4: Secondary | ICD-10-CM | POA: Diagnosis not present

## 2021-06-17 DIAGNOSIS — I63112 Cerebral infarction due to embolism of left vertebral artery: Secondary | ICD-10-CM | POA: Diagnosis not present

## 2021-06-17 DIAGNOSIS — I639 Cerebral infarction, unspecified: Secondary | ICD-10-CM | POA: Diagnosis not present

## 2021-06-17 DIAGNOSIS — I6300 Cerebral infarction due to thrombosis of unspecified precerebral artery: Secondary | ICD-10-CM | POA: Diagnosis not present

## 2021-06-17 DIAGNOSIS — H409 Unspecified glaucoma: Secondary | ICD-10-CM | POA: Diagnosis not present

## 2021-06-17 DIAGNOSIS — N390 Urinary tract infection, site not specified: Secondary | ICD-10-CM | POA: Diagnosis not present

## 2021-06-17 DIAGNOSIS — Z8673 Personal history of transient ischemic attack (TIA), and cerebral infarction without residual deficits: Secondary | ICD-10-CM | POA: Diagnosis not present

## 2021-06-17 DIAGNOSIS — G3184 Mild cognitive impairment, so stated: Secondary | ICD-10-CM | POA: Diagnosis present

## 2021-06-17 DIAGNOSIS — I10 Essential (primary) hypertension: Secondary | ICD-10-CM | POA: Diagnosis not present

## 2021-06-17 DIAGNOSIS — M1990 Unspecified osteoarthritis, unspecified site: Secondary | ICD-10-CM | POA: Diagnosis present

## 2021-06-17 DIAGNOSIS — E8809 Other disorders of plasma-protein metabolism, not elsewhere classified: Secondary | ICD-10-CM | POA: Diagnosis not present

## 2021-06-17 DIAGNOSIS — Z91018 Allergy to other foods: Secondary | ICD-10-CM | POA: Diagnosis not present

## 2021-06-17 DIAGNOSIS — E039 Hypothyroidism, unspecified: Secondary | ICD-10-CM | POA: Diagnosis not present

## 2021-06-17 DIAGNOSIS — Z7983 Long term (current) use of bisphosphonates: Secondary | ICD-10-CM | POA: Diagnosis not present

## 2021-06-17 DIAGNOSIS — E785 Hyperlipidemia, unspecified: Secondary | ICD-10-CM | POA: Diagnosis not present

## 2021-06-17 DIAGNOSIS — I63532 Cerebral infarction due to unspecified occlusion or stenosis of left posterior cerebral artery: Secondary | ICD-10-CM | POA: Diagnosis not present

## 2021-06-17 DIAGNOSIS — R2689 Other abnormalities of gait and mobility: Secondary | ICD-10-CM | POA: Diagnosis not present

## 2021-06-17 DIAGNOSIS — I69391 Dysphagia following cerebral infarction: Secondary | ICD-10-CM | POA: Diagnosis not present

## 2021-06-17 DIAGNOSIS — Z91048 Other nonmedicinal substance allergy status: Secondary | ICD-10-CM | POA: Diagnosis not present

## 2021-06-17 DIAGNOSIS — B9620 Unspecified Escherichia coli [E. coli] as the cause of diseases classified elsewhere: Secondary | ICD-10-CM | POA: Diagnosis not present

## 2021-06-17 DIAGNOSIS — E0781 Sick-euthyroid syndrome: Secondary | ICD-10-CM | POA: Diagnosis present

## 2021-06-17 DIAGNOSIS — I6389 Other cerebral infarction: Secondary | ICD-10-CM | POA: Diagnosis not present

## 2021-06-17 DIAGNOSIS — Z885 Allergy status to narcotic agent status: Secondary | ICD-10-CM | POA: Diagnosis not present

## 2021-06-17 DIAGNOSIS — M810 Age-related osteoporosis without current pathological fracture: Secondary | ICD-10-CM | POA: Diagnosis not present

## 2021-06-17 DIAGNOSIS — Z90710 Acquired absence of both cervix and uterus: Secondary | ICD-10-CM | POA: Diagnosis not present

## 2021-06-17 DIAGNOSIS — E46 Unspecified protein-calorie malnutrition: Secondary | ICD-10-CM | POA: Diagnosis not present

## 2021-06-17 DIAGNOSIS — Z86718 Personal history of other venous thrombosis and embolism: Secondary | ICD-10-CM | POA: Diagnosis not present

## 2021-06-17 DIAGNOSIS — Z9889 Other specified postprocedural states: Secondary | ICD-10-CM | POA: Diagnosis not present

## 2021-06-17 DIAGNOSIS — K449 Diaphragmatic hernia without obstruction or gangrene: Secondary | ICD-10-CM | POA: Diagnosis not present

## 2021-06-17 MED ORDER — METOCLOPRAMIDE HCL 10 MG PO TABS
10.0000 mg | ORAL_TABLET | Freq: Once | ORAL | Status: AC
  Administered 2021-06-17: 10 mg via ORAL
  Filled 2021-06-17: qty 1

## 2021-06-17 MED ORDER — METOCLOPRAMIDE HCL 5 MG/ML IJ SOLN
10.0000 mg | Freq: Three times a day (TID) | INTRAMUSCULAR | Status: DC
  Administered 2021-06-17: 10 mg via INTRAVENOUS
  Filled 2021-06-17: qty 2

## 2021-06-17 MED ORDER — KETOROLAC TROMETHAMINE 15 MG/ML IJ SOLN: 15.0000 mg | Freq: Three times a day (TID) | INTRAMUSCULAR | Status: DC | PRN

## 2021-06-17 MED ORDER — ACETAMINOPHEN 500 MG PO TABS
1000.0000 mg | ORAL_TABLET | Freq: Four times a day (QID) | ORAL | Status: DC | PRN
  Administered 2021-06-17 – 2021-06-18 (×2): 1000 mg via ORAL
  Filled 2021-06-17 (×3): qty 2

## 2021-06-17 MED ORDER — QUETIAPINE FUMARATE 25 MG PO TABS
25.0000 mg | ORAL_TABLET | Freq: Every day | ORAL | Status: DC
  Administered 2021-06-17 – 2021-06-23 (×7): 25 mg via ORAL
  Filled 2021-06-17 (×7): qty 1

## 2021-06-17 MED ORDER — BOOST / RESOURCE BREEZE PO LIQD CUSTOM
1.0000 | Freq: Three times a day (TID) | ORAL | Status: DC
  Administered 2021-06-17 – 2021-06-23 (×14): 1 via ORAL

## 2021-06-17 NOTE — NC FL2 (Signed)
[place] MEDICAID FL2 LEVEL OF CARE SCREENING TOOL     IDENTIFICATION  Patient Name: Caroline [name] Birthdate: 1936/01/18 Sex: female Admission Date (Current Location): 06/15/2021  [facility] and [place] Number:  Producer, television/film/video and Address:  [facility],  [address] [place], [place] [address]      Provider Number: [id number]  Attending Physician Name and Address:  Berna Bue, MD  Relative Name and Phone Number:  Santiaga Butzin (spouse) Ph: [phone]    Current Level of Care: Hospital Recommended Level of Care: Skilled Nursing Facility Prior Approval Number:    Date Approved/Denied:   PASRR Number: [id number]  Discharge Plan: SNF    Current Diagnoses: Patient Active Problem List   Diagnosis Date Noted   S/P repair of paraesophageal hernia 06/15/2021   CVA (cerebral vascular accident) (HCC) 02/09/2018   Hypothyroidism 02/08/2018   Essential hypertension 02/08/2018   Acute ischemic stroke (HCC) 02/08/2018   Acute lower UTI 02/08/2018   Acute cystitis without hematuria    Right thalamic stroke (HCC)    Hyperlipidemia     Orientation RESPIRATION BLADDER Height & Weight     Self, Place  Normal Incontinent Weight: 137 lb (62.1 kg) Height:  5\' 4"  (162.6 cm)  BEHAVIORAL SYMPTOMS/MOOD NEUROLOGICAL BOWEL NUTRITION STATUS      Continent Diet (Dysphagia 1 diet)  AMBULATORY STATUS COMMUNICATION OF NEEDS Skin   Limited Assist Verbally Surgical wounds                       Personal Care Assistance Level of Assistance  Bathing, Feeding, Dressing Bathing Assistance: Limited assistance Feeding assistance: Independent Dressing Assistance: Limited assistance     Functional Limitations Info  Sight, Hearing, Speech Sight Info: Adequate Hearing Info: Adequate Speech Info: Adequate    SPECIAL CARE FACTORS FREQUENCY  PT (By licensed PT), OT (By licensed OT)     PT Frequency: 5x's/week OT Frequency: 5x's/week            Contractures  Contractures Info: Not present    Additional Factors Info  Code Status, Allergies, Psychotropic Code Status Info: Full Allergies Info: Gluten Meal, Wheat Bran, Adhesive (Tape), Codeine, Penicillins Psychotropic Info: Seroquel         Current Medications (06/17/2021):  This is the current hospital active medication list Current Facility-Administered Medications  Medication Dose Route Frequency Provider Last Rate Last Admin   acetaminophen (TYLENOL) tablet 1,000 mg  1,000 mg Oral Q6H PRN 06/19/2021, MD       bisacodyl (DULCOLAX) suppository 10 mg  10 mg Rectal Daily PRN Berna Bue, MD       clopidogrel (PLAVIX) tablet 75 mg  75 mg Oral Daily Berna Bue A, MD   75 mg at 06/17/21 0855   docusate sodium (COLACE) capsule 100 mg  100 mg Oral BID 06/19/21 A, MD   100 mg at 06/17/21 0854   enoxaparin (LOVENOX) injection 40 mg  40 mg Subcutaneous Q24H 06/19/21 A, MD   40 mg at 06/17/21 0855   feeding supplement (BOOST / RESOURCE BREEZE) liquid 1 Container  1 Container Oral TID BM 06/19/21, MD   1 Container at 06/17/21 0855   hydrALAZINE (APRESOLINE) injection 10 mg  10 mg Intravenous Q2H PRN 06/19/21 A, MD   10 mg at 06/15/21 1706   irbesartan (AVAPRO) tablet 75 mg  75 mg Oral Daily 08/15/21 A, MD   75 mg at 06/17/21 [id number]  ketorolac (TORADOL) 15 MG/ML injection 15 mg  15 mg Intravenous Q8H PRN Phylliss Blakes A, MD       levothyroxine (SYNTHROID) tablet 112 mcg  112 mcg Oral Q0600 Phylliss Blakes A, MD   112 mcg at 06/17/21 5631   metoCLOPramide (REGLAN) injection 10 mg  10 mg Intravenous Q8H Phylliss Blakes A, MD   10 mg at 06/17/21 1423   metoprolol tartrate (LOPRESSOR) injection 5 mg  5 mg Intravenous Q6H PRN Phylliss Blakes A, MD       ondansetron (ZOFRAN-ODT) disintegrating tablet 4 mg  4 mg Oral Q6H PRN Berna Bue, MD       Or   ondansetron [facility]) injection 4 mg  4 mg Intravenous Q6H PRN Phylliss Blakes A, MD       pantoprazole  (PROTONIX) injection 40 mg  40 mg Intravenous QHS Phylliss Blakes A, MD   40 mg at 06/16/21 2005   QUEtiapine (SEROQUEL) tablet 25 mg  25 mg Oral QHS Berna Bue, MD         Discharge Medications: Please see discharge summary for a list of discharge medications.  Relevant Imaging Results:  Relevant Lab Results:   Additional Information SSN: [id number]  Ewing Schlein, LCSW

## 2021-06-17 NOTE — Evaluation (Signed)
Physical Therapy Evaluation Patient Details Name: Caroline [name] MRN: [id number] DOB: December 31, 1935 Today's Date: 06/17/2021  History of Present Illness  Pt is an 85 year old female s/p status post robotic repair of paraesophageal hernia with gastropexy on 06/15/21 by Dr. Fredricka Bonine.  Pt with post operative lethargy and confusion; Suspect that she has some low-grade underlying dementia which has been exacerbated by undergoing general anesthesia.  PMHx: CVA, scoliosis, DVT, GERD, HTN  Clinical Impression  Pt admitted with above diagnosis.  Pt currently with functional limitations due to the deficits listed below (see PT Problem List). Pt will benefit from skilled PT to increase their independence and safety with mobility to allow discharge to the venue listed below.  Pt presents with decreased cognition and required multimodal cues for technique and safety at this time.  Pt typically lives at home with spouse and able to ambulate without assistive device and perform ADLs.  Pt would benefit from SNF upon d/c if 24/7 if not an option at home.  Family is attempting to plan for 24/7 caregiver however uncertain how pt will perform with post operative cognition changes.      Recommendations for follow up therapy are one component of a multi-disciplinary discharge planning process, led by the attending physician.  Recommendations may be updated based on patient status, additional functional criteria and insurance authorization.  Follow Up Recommendations SNF    Equipment Recommendations  None recommended by PT    Recommendations for Other Services       Precautions / Restrictions Precautions Precautions: Fall      Mobility  Bed Mobility Overal bed mobility: Needs Assistance Bed Mobility: Sit to Supine       Sit to supine: Mod assist   General bed mobility comments: assist for LEs onto bed    Transfers Overall transfer level: Needs assistance Equipment used: Rolling walker (2 wheeled) Transfers:  Sit to/from Stand Sit to Stand: Min assist         General transfer comment: multimodal cues for technique, manual assist for hand placement, assist to rise and steady  Ambulation/Gait Ambulation/Gait assistance: Min assist;+2 safety/equipment Gait Distance (Feet): 80 Feet Assistive device: Rolling walker (2 wheeled) Gait Pattern/deviations: Step-through pattern;Decreased stride length;Trunk flexed     General Gait Details: verbal cues for RW positioning and posture (pt tends to keep RW too far forward and remain in trunk flexion however able to improve posture briefly with cues so provided frequent cues)  Stairs            Wheelchair Mobility    Modified Rankin (Stroke Patients Only)       Balance Overall balance assessment: Needs assistance         Standing balance support: Bilateral upper extremity supported Standing balance-Leahy Scale: Poor Standing balance comment: reliant on UE support                             Pertinent Vitals/Pain Pain Assessment: No/denies pain    Home Living Family/patient expects to be discharged to:: Private residence Living Arrangements: Spouse/significant other Available Help at Discharge: Family Type of Home: House Home Access: Stairs to enter   Secretary/administrator of Steps: 1 Home Layout: Able to live on main level with bedroom/bathroom Home Equipment: Walker - 2 wheels;Cane - single point      Prior Function Level of Independence: Independent         Comments: typically ambulatory at home without assistive device, able  to perform ADLs     Hand Dominance        Extremity/Trunk Assessment        Lower Extremity Assessment Lower Extremity Assessment: Generalized weakness    Cervical / Trunk Assessment Cervical / Trunk Assessment: Kyphotic  Communication   Communication: No difficulties  Cognition Arousal/Alertness: Awake/alert Behavior During Therapy: Flat affect Overall Cognitive  Status: Impaired/Different from baseline Area of Impairment: Problem solving;Following commands;Safety/judgement                       Following Commands: Follows one step commands with increased time Safety/Judgement: Decreased awareness of safety;Decreased awareness of deficits   Problem Solving: Slow processing;Requires verbal cues;Difficulty sequencing General Comments: pt able to remain awake for session however closes eyes when not stimulated      General Comments      Exercises     Assessment/Plan    PT Assessment Patient needs continued PT services  PT Problem List Decreased strength;Decreased mobility;Decreased activity tolerance;Decreased balance;Decreased knowledge of use of DME;Decreased cognition;Decreased safety awareness       PT Treatment Interventions Gait training;DME instruction;Therapeutic exercise;Functional mobility training;Therapeutic activities;Patient/family education;Balance training;Cognitive remediation    PT Goals (Current goals can be found in the Care Plan section)  Acute Rehab PT Goals Patient Stated Goal: family hopes pt can return home with 24/7 care PT Goal Formulation: With family Time For Goal Achievement: 07/01/21 Potential to Achieve Goals: Good    Frequency Min 3X/week   Barriers to discharge        Co-evaluation               AM-PAC PT "6 Clicks" Mobility  Outcome Measure Help needed turning from your back to your side while in a flat bed without using bedrails?: A Little Help needed moving from lying on your back to sitting on the side of a flat bed without using bedrails?: A Lot Help needed moving to and from a bed to a chair (including a wheelchair)?: A Little Help needed standing up from a chair using your arms (e.g., wheelchair or bedside chair)?: A Little Help needed to walk in hospital room?: A Little Help needed climbing 3-5 steps with a railing? : A Lot 6 Click Score: 16    End of Session Equipment  Utilized During Treatment: Gait belt Activity Tolerance: Patient tolerated treatment well Patient left: in bed;with bed alarm set;with call bell/phone within reach;with family/visitor present Nurse Communication: Mobility status PT Visit Diagnosis: Difficulty in walking, not elsewhere classified (R26.2);Unsteadiness on feet (R26.81)    Time: 1430-1456 PT Time Calculation (min) (ACUTE ONLY): 26 min   Charges:   PT Evaluation $PT Eval Low Complexity: 1 Low PT Treatments $Gait Training: 8-22 mins      Thomasene Mohair PT, DPT Acute Rehabilitation Services Pager: [phone] Office: [phone]   Caroline [name] Payson 06/17/2021, 3:17 PM

## 2021-06-17 NOTE — TOC Initial Note (Signed)
Transition of Care [facility]) - Initial/Assessment Note   Patient Details  Name: Caroline [name] MRN: [id number] Date of Birth: [birth year]  Transition of [facility]) CM/SW Contact:    Ewing Schlein, LCSW Phone Number: 06/17/2021, 3:09 PM  Clinical Narrative: PT evaluation recommended SNF, but patient will be monitored overnight to see if there is improvement and determine if the patient will have 24/7 care available at home at discharge.  FL2 done; PASRR received. TOC to follow up in AM.  Expected Discharge Plan: Skilled Nursing Facility Barriers to Discharge: SNF Pending bed offer, Continued Medical Work up  Patient Goals and CMS Choice Patient states their goals for this hospitalization and ongoing recovery are:: Go to SNF if 24/7 home caregivers fall through [facility] Medicare.gov Compare Post Acute Care list provided to:: Patient Represenative (must comment) Choice offered to / list presented to : Spouse  Expected Discharge Plan and Services Expected Discharge Plan: Skilled Nursing Facility In-house Referral: Clinical Social Work Post Acute Care Choice: Skilled Nursing Facility Living arrangements for the past 2 months: Single Family Home              DME Arranged: N/A DME Agency: NA  Prior Living Arrangements/Services Living arrangements for the past 2 months: Single Family Home Lives with:: Spouse Patient language and need for interpreter reviewed:: Yes Do you feel safe going back to the place where you live?: Yes      Need for Family Participation in Patient Care: Yes (Comment) (Patient oriented x2.) Care giver support system in place?: Yes (comment) Criminal Activity/Legal Involvement Pertinent to Current Situation/Hospitalization: No - Comment as needed  Activities of Daily Living Home Assistive Devices/Equipment: Eyeglasses, Blood pressure cuff, Cane (specify quad or straight), Walker (specify type), Grab bars in shower ADL Screening (condition at time of admission) Patient's  cognitive ability adequate to safely complete daily activities?: Yes Is the patient deaf or have difficulty hearing?: No Does the patient have difficulty seeing, even when wearing glasses/contacts?: No Does the patient have difficulty concentrating, remembering, or making decisions?: No Patient able to express need for assistance with ADLs?: Yes Does the patient have difficulty dressing or bathing?: No Independently performs ADLs?: Yes (appropriate for developmental age) Does the patient have difficulty walking or climbing stairs?: No Weakness of Legs: None Weakness of Arms/Hands: None  Emotional Assessment Appearance:: Appears stated age Orientation: : Oriented to Self, Oriented to Place Alcohol / Substance Use: Not Applicable Psych Involvement: No (comment)  Admission diagnosis:  S/P repair of paraesophageal hernia [Q19.758, Z87.19] Patient Active Problem List   Diagnosis Date Noted   S/P repair of paraesophageal hernia 06/15/2021   CVA (cerebral vascular accident) (HCC) 02/09/2018   Hypothyroidism 02/08/2018   Essential hypertension 02/08/2018   Acute ischemic stroke (HCC) 02/08/2018   Acute lower UTI 02/08/2018   Acute cystitis without hematuria    Right thalamic stroke (HCC)    Hyperlipidemia    PCP:  Tisovec, Adelfa Koh, MD Pharmacy:   [facility] - [place], [place] - [address] [place] [address] Phone: [phone] Fax: [phone]  [facility] - [place], [place] - [address] [place] [address] Phone: [phone] Fax: [phone]  Readmission Risk Interventions No flowsheet data found.

## 2021-06-17 NOTE — Evaluation (Signed)
SLP Cancellation Note  Patient Details Name: Amarionna Arca MRN: [id number] DOB: 08-26-1936   Cancelled treatment:       Reason Eval/Treat Not Completed: Fatigue/lethargy limiting ability to participate (per RN, pt is lethargic unless aroused, will attempt again next date for optimal participation) Rolena Infante, MS [facility] SLP Acute Rehab Services Office [phone] Pager [phone]   Chales Abrahams 06/17/2021, 3:28 PM

## 2021-06-17 NOTE — Discharge Instructions (Addendum)
LAPAROSCOPIC SURGERY: POST OP INSTRUCTIONS   EAT See instructions below.   WALK Walk an hour a day (cumulative- not all at once).  Control your pain to do that.    CONTROL PAIN Control pain so that you can walk, sleep, tolerate sneezing/coughing, go up/down stairs.  HAVE A BOWEL MOVEMENT DAILY Keep your bowels regular to avoid problems.  OK to try a laxative to override constipation.  OK to use an antidairrheal to slow down diarrhea.  Call if not better after 2 tries  CALL IF YOU HAVE PROBLEMS/CONCERNS Call if you are still struggling despite following these instructions. Call if you have concerns not answered by these instructions    DIET:   After your esophageal surgery, expect some sticking with swallowing over the next 1-2 months.    If food sticks when you eat, it is called "dysphagia".  This is due to swelling around your esophagus at the wrap & hiatal diaphragm repair.  It will gradually ease off over the next few months.  To help you through this temporary phase, we start you out on a pureed (blenderized) diet.  Your first meal in the hospital was thin liquids.  You should have been given a pureed diet by the time you left the hospital.  We ask patients to stay on a pureed diet for the first 2-3 weeks to avoid anything getting "stuck" near your recent surgery.  Don't be alarmed if your ability to swallow doesn't progress according to this plan.  Everyone is different and some diets can advance more or less quickly.    It is often helpful to crush your medications or split them as they can sometimes stick, especially the first week or so.   Some BASIC RULES to follow are: Maintain an upright position whenever eating or drinking. Take small bites - just a teaspoon size bite at a time. Eat slowly.  It may also help to eat only one food at a time. Consider nibbling through smaller, more frequent meals & avoid the urge to eat BIG meals Do not push through feelings of fullness,  nausea, or bloatedness Do not mix solid foods and liquids in the same mouthful Try not to "wash foods down" with large gulps of liquids. Avoid carbonated (bubbly/fizzy) drinks.   Avoid foods that make you feel gassy or bloated.  Start with bland foods first.  Wait on trying greasy, fried, or spicy meals until you are tolerating more bland solids well. Understand that it will be hard to burp and belch at first.  This gradually improves with time.  Expect to be more gassy/flatulent/bloated initially.  Walking will help your body manage it better. Consider using medications for bloating that contain simethicone such as  Maalox or Gas-X  Consider crushing her medications, especially smaller pills.  The ability to swallow pills should get easier after a few weeks Eat in a relaxed atmosphere & minimize distractions. Avoid talking while eating.   Do not use straws. Following each meal, sit in an upright position (90 degree angle) for 60 to 90 minutes.  Going for a short walk can help as well If food does stick, don't panic.  Try to relax and let the food pass on its own.  Sipping WARM LIQUID such as strong hot black tea can also help slide it down.   Be gradual in changes & use common sense:  -If you easily tolerating a certain "level" of foods, advance to the next level gradually -If you are  having trouble swallowing a particular food, then avoid it.   -If food is sticking when you advance your diet, go back to thinner previous diet (the lower LEVEL) for 1-2 days.  LEVEL 1 = PUREED DIET  Do for the first 2 WEEKS AFTER SURGERY  -Foods in this group are pureed or blenderized to a smooth, mashed potato-like consistency.  -If necessary, the pureed foods can keep their shape with the addition of a thickening agent.   -Meat should be pureed to a smooth, pasty consistency.  Hot broth or gravy may be added to the pureed meat, approximately 1 oz. of liquid per 3 oz. serving of meat. -CAUTION:  If any  foods do not puree into a smooth consistency, swallowing will be more difficult.  (For example, nuts or seeds sometimes do not blend well.)  Hot Foods Cold Foods  Pureed scrambled eggs and cheese Pureed cottage cheese  Baby cereals Thickened juices and nectars  Thinned cooked cereals (no lumps) Thickened milk or eggnog  Pureed [place] toast or pancakes Ensure  Mashed potatoes Ice cream  Pureed parsley, au gratin, scalloped potatoes, candied sweet potatoes Fruit or [place] ice, sherbet  Pureed buttered or alfredo noodles Plain yogurt  Pureed vegetables (no corn or peas) Instant breakfast  Pureed soups and creamed soups Smooth pudding, mousse, custard  Pureed scalloped apples Whipped gelatin  Gravies Sugar, syrup, honey, jelly  Sauces, cheese, tomato, barbecue, white, creamed Cream  Any baby food Creamer  Alcohol in moderation (not beer or champagne) Margarine  Coffee or tea Mayonnaise   Ketchup, mustard   Apple sauce   SAMPLE MENU:  PUREED DIET Breakfast Lunch Dinner  Orange juice, 1/2 cup Cream of wheat, 1/2 cup Pineapple juice, 1/2 cup Pureed [place], barley soup, 3/4 cup Pureed Hawaiian chicken, 3 oz  Scrambled eggs, mashed or blended with cheese, 1/2 cup Tea or coffee, 1 cup  Whole milk, 1 cup  Non-dairy creamer, 2 Tbsp. Mashed potatoes, 1/2 cup Pureed cooled broccoli, 1/2 cup Apple sauce, 1/2 cup Coffee or tea Mashed potatoes, 1/2 cup Pureed spinach, 1/2 cup Frozen yogurt, 1/2 cup Tea or coffee      LEVEL 2 = SOFT DIET  After your first 2 weeks, you can advance to a soft diet.   Keep on this diet until everything goes down easily.  Hot Foods Cold Foods  White fish Cottage cheese  Stuffed fish Junior baby fruit  Baby food meals Semi thickened juices  Minced soft cooked, scrambled, poached eggs nectars  Souffle & omelets Ripe mashed bananas  Cooked cereals Canned fruit, pineapple sauce, milk  potatoes Milkshake  Buttered or Alfredo noodles Custard  Cooked cooled  vegetable Puddings, including tapioca  Sherbet Yogurt  Vegetable soup or alphabet soup Fruit ice, [place] ice  Gravies Whipped gelatin  Sugar, syrup, honey, jelly Junior baby desserts  Sauces:  Cheese, creamed, barbecue, tomato, white Cream  Coffee or tea Margarine   SAMPLE MENU:  LEVEL 2 Breakfast Lunch Dinner  Orange juice, 1/2 cup Oatmeal, 1/2 cup Scrambled eggs with cheese, 1/2 cup Decaffeinated tea, 1 cup Whole milk, 1 cup Non-dairy creamer, 2 Tbsp Pineapple juice, 1/2 cup Minced beef, 3 oz Gravy, 2 Tbsp Mashed potatoes, 1/2 cup Minced fresh broccoli, 1/2 cup Applesauce, 1/2 cup Coffee, 1 cup [place], barley soup, 3/4 cup Minced Hawaiian chicken, 3 oz Mashed potatoes, 1/2 cup Cooked spinach, 1/2 cup Frozen yogurt, 1/2 cup Non-dairy creamer, 2 Tbsp      LEVEL 3 = CHOPPED DIET  -  After all the foods in level 2 (soft diet) are passing through well you should advance up to more chopped foods.  -It is still important to cut these foods into small pieces and eat slowly.  Hot Foods Cold Foods  Poultry Cottage cheese  Chopped Swedish meatballs Yogurt  Meat salads (ground or flaked meat) Milk  Flaked fish (tuna) Milkshakes  Poached or scrambled eggs Soft, cold, dry cereal  Souffles and omelets Fruit juices or nectars  Cooked cereals Chopped canned fruit  Chopped [place] toast or pancakes Canned fruit cocktail  Noodles or pasta (no rice) Pudding, mousse, custard  Cooked vegetables (no frozen peas, corn, or mixed vegetables) Green salad  Canned small sweet peas Ice cream  Creamed soup or vegetable soup Fruit ice, [place] ice  Pureed vegetable soup or alphabet soup Non-dairy creamer  Ground scalloped apples Margarine  Gravies Mayonnaise  Sauces:  Cheese, creamed, barbecue, tomato, white Ketchup  Coffee or tea Mustard   SAMPLE MENU:  LEVEL 3 Breakfast Lunch Dinner  Orange juice, 1/2 cup Oatmeal, 1/2 cup Scrambled eggs with cheese, 1/2 cup Decaffeinated tea, 1  cup Whole milk, 1 cup Non-dairy creamer, 2 Tbsp Ketchup, 1 Tbsp Margarine, 1 tsp Salt, 1/4 tsp Sugar, 2 tsp Pineapple juice, 1/2 cup Ground beef, 3 oz Gravy, 2 Tbsp Mashed potatoes, 1/2 cup Cooked spinach, 1/2 cup Applesauce, 1/2 cup Decaffeinated coffee Whole milk Non-dairy creamer, 2 Tbsp Margarine, 1 tsp Salt, 1/4 tsp Pureed [place], barley soup, 3/4 cup Barbecue chicken, 3 oz Mashed potatoes, 1/2 cup Ground fresh broccoli, 1/2 cup Frozen yogurt, 1/2 cup Decaffeinated tea, 1 cup Non-dairy creamer, 2 Tbsp Margarine, 1 tsp Salt, 1/4 tsp Sugar, 1 tsp    LEVEL 4:  REGULAR FOODS  -Foods in this group are soft, moist, regularly textured foods.   -This level includes meat and breads, which tend to be the hardest things to swallow.   -Eat very slowly, chew well and continue to avoid carbonated drinks. -most people are at this level in 4-6 weeks  Hot Foods Cold Foods  Baked fish or skinned Soft cheeses - cottage cheese  Souffles and omelets Cream cheese  Eggs Yogurt  Stuffed shells Milk  Spaghetti with meat sauce Milkshakes  Cooked cereal Cold dry cereals (no nuts, dried fruit, coconut)  [place] toast or pancakes Crackers  Buttered toast Fruit juices or nectars  Noodles or pasta (no rice) Canned fruit  Potatoes (all types) Ripe bananas  Soft, cooked vegetables (no corn, lima, or baked beans) Peeled, ripe, fresh fruit  Creamed soups or vegetable soup Cakes (no nuts, dried fruit, coconut)  Canned chicken noodle soup Plain doughnuts  Gravies Ice cream  Bacon dressing Pudding, mousse, custard  Sauces:  Cheese, creamed, barbecue, tomato, white Fruit ice, [place] ice, sherbet  Decaffeinated tea or coffee Whipped gelatin  Pork chops Regular gelatin   Canned fruited gelatin molds   Sugar, syrup, honey, jam, jelly   Cream   Non-dairy   Margarine   Oil   Mayonnaise   Ketchup   Mustard   TROUBLESHOOTING IRREGULAR BOWELS  1) Avoid extremes of bowel movements (no bad  constipation/diarrhea)  2) Miralax 17gm mixed in 8oz. water or juice-daily. May use BID as needed.  3) Gas-x,Phazyme, etc. as needed for gas & bloating.  4) Soft,bland diet. No spicy,greasy,fried foods.  5) Prilosec over-the-counter as needed  6) May hold gluten/wheat products from diet to see if symptoms improve.  7) May try probiotics (Align, Activa, etc) to help calm the  bowels down  7) If symptoms become worse call back immediately.    If you have any questions please call our office at [facility]: [phone].   Take your usually prescribed home medications unless otherwise directed.  PAIN CONTROL: Pain is best controlled by a usual combination of three different methods TOGETHER: Ice/Heat Over the counter pain medication Prescription pain medication Most patients will experience some swelling and bruising around the incisions.  Ice packs or heating pads (30-60 minutes up to 6 times a day) will help. Use ice for the first few days to help decrease swelling and bruising, then switch to heat to help relax tight/sore spots and speed recovery.  Some people prefer to use ice alone, heat alone, alternating between ice & heat.  Experiment to what works for you.  Swelling and bruising can take several weeks to resolve.   It is helpful to take an over-the-counter pain medication regularly for the first few days: Naproxen (Aleve, etc)  Two 220mg  tabs twice a day OR Ibuprofen (Advil, etc) Three 200mg  tabs four times a day (every meal & bedtime) AND Acetaminophen (Tylenol, etc) 500-650mg  four times a day (every meal & bedtime) A  prescription for pain medication (such as oxycodone, hydrocodone, tramadol, gabapentin, methocarbamol, etc) should be given to you upon discharge.  Take your pain medication as prescribed, IF NEEDED.  If you are having problems/concerns with the prescription medicine (does not control pain, nausea, vomiting, rash, itching, etc), please call [phone]  to see if we need to switch you to a different pain medicine that will work better for you and/or control your side effect better. If you need a refill on your pain medication, please give [place] 48 hour notice.  contact your pharmacy.  They will contact our office to request authorization. Prescriptions will not be filled after 5 pm or on week-ends  Avoid getting constipated.   Between the surgery and the pain medications, it is common to experience some constipation.   Increasing fluid intake and taking a fiber supplement (such as Metamucil, Citrucel, FiberCon, MiraLax, etc) 1-2 times a day regularly will usually help prevent this problem from occurring.   A mild laxative (prune juice, Milk of Magnesia, MiraLax, etc) should be taken according to package directions if there are no bowel movements after 48 hours.   Watch out for diarrhea.   If you have many loose bowel movements, simplify your diet to bland foods & liquids for a few days.   Stop any stool softeners and decrease your fiber supplement.   Switching to mild anti-diarrheal medications (Kayopectate, Pepto Bismol) can help.   If this worsens or does not improve, please call [phone].  Wash / shower every day.  You may shower over the skin glue which is waterproof. No soaking or swimming, do not submerge, no rubbing/scrubbing, lotions or ointments.   Glue will flake off after about 2 weeks.  You may leave the incision open to air.  You may replace a dressing/Band-Aid to cover the incision for comfort if you wish.   ACTIVITIES as tolerated:   You may resume regular (light) daily activities beginning the next day--such as daily self-care, walking, climbing stairs--gradually increasing activities as tolerated.  If you can walk 30 minutes without difficulty, it is safe to try more intense activity such as jogging, treadmill, bicycling, low-impact aerobics, swimming, etc. Refrain from the most intensive and strenuous activity for last such as sit-ups,  heavy lifting, contact sports, etc  Refrain  from any heavy lifting or straining until at least 6 weeks after surgery.   DO NOT PUSH THROUGH PAIN.  Let pain be your guide: If it hurts to do something, don't do it.  Pain is your body warning you to avoid that activity for another week until the pain goes down. You may drive when you are no longer taking prescription pain medication, you can comfortably wear a seatbelt, and you can safely maneuver your car and apply brakes. You may have sexual intercourse when it is comfortable.  FOLLOW UP in our office Please call [facility] at [phone] to set up an appointment to see your surgeon in the office for a follow-up appointment approximately 2-3 weeks after your surgery. Make sure that you call for this appointment the day you arrive home to insure a convenient appointment time.  10. IF YOU HAVE DISABILITY OR FAMILY LEAVE FORMS, BRING THEM TO THE OFFICE FOR PROCESSING.  DO NOT GIVE THEM TO YOUR DOCTOR.   WHEN TO CALL us [phone]: Poor pain control Reactions / problems with new medications (rash/itching, nausea, etc)  Fever over 101.5 F (38.5 C) Inability to urinate Nausea and/or vomiting Worsening swelling or bruising Continued bleeding from incision. Increased pain, redness, or drainage from the incision   The clinic staff is available to answer your questions during regular business hours (8:30am-5pm).  Please don't hesitate to call and ask to speak to one of our nurses for clinical concerns.   If you have a medical emergency, go to the nearest emergency room or call 911.  A surgeon from [facility] is always on call at the [facility], [place] [address], [place], [place]  [address] ? MAIN: [phone] ? TOLL FREE: [phone] ?  FAX [phone] [email or web address]

## 2021-06-17 NOTE — Progress Notes (Signed)
S: Sunsetting last night.   O: Vitals, labs, intake/output, and orders reviewed at this time. Afebrile, no tachycardia, mildly hypertensive, 95% on room air. PO 600, UOP 4 occurrences   Gen: Sleeping comfortably, awakens easily to voice.  She is oriented to self, able to give her husband's name and her birthday, but reports that it is 57 and that she is in [place] at the doctor's office. H&N: EOMI, atraumatic, neck supple Chest: unlabored respirations, RRR Abd: soft, non-tender, nondistended, incision(s) c/d/i with Dermabond, no cellulitis or hematoma Ext: warm, no edema Neuro: grossly normal  Lines/tubes/drains: PIV  A/P: Postop day 2 status post robotic paraesophageal hernia repair with gastropexy -Surgery was uneventful, postop upper GI as expected -Continue pured diet -Suspect she has some low-grade underlying dementia which has been exacerbated by undergoing anesthesia and is now having some delirium with sunsetting.  Seroquel was ordered last night but the formulation was the extended release.  We will change this to lower dose not extended release.  Continue sleep hygiene precautions.  PT, OT, and speech evaluations today, consult to transitions of care for potential need for facility placement.  Her mental status is the only thing keeping her in the hospital at this point.  Phylliss Blakes, MD [facility], [place]

## 2021-06-18 ENCOUNTER — Inpatient Hospital Stay (HOSPITAL_COMMUNITY): Payer: Medicare Other

## 2021-06-18 MED ORDER — GADOBUTROL 1 MMOL/ML IV SOLN
6.0000 mL | Freq: Once | INTRAVENOUS | Status: AC | PRN
  Administered 2021-06-18: 6 mL via INTRAVENOUS

## 2021-06-18 MED ORDER — QUETIAPINE FUMARATE 25 MG PO TABS
25.0000 mg | ORAL_TABLET | Freq: Every day | ORAL | 0 refills | Status: DC
Start: 2021-06-18 — End: 2021-07-07

## 2021-06-18 NOTE — Evaluation (Signed)
Occupational Therapy Evaluation Patient Details Name: Caroline [name] MRN: [id number] DOB: 1936/02/19 Today's Date: 06/18/2021   History of Present Illness Pt is an 85 year old female s/p status post robotic repair of paraesophageal hernia with gastropexy on 06/15/21 by Dr. Fredricka Bonine.  Pt with post operative lethargy and confusion; Suspect that she has some low-grade underlying dementia which has been exacerbated by undergoing general anesthesia.  PMHx: CVA, scoliosis, DVT, GERD, HTN   Clinical Impression   Patient from home with spouse, info obtained primarily from physical therapy evaluation as patient is confused and history of dementia. Cannot recall name of hospital/month after ~8 minutes time lapse. Also needing max cues for sequencing with walker/side stepping to recliner chair. Overall min A for functional transfers/ambulation, supervision for upper body and mod A for lower body ADLs due to poor dynamic standing balance and safety. If patient's family feel they can provide current assist levels would need 24/7 supervision and recommend [facility] services, if not then short term rehab.       Recommendations for follow up therapy are one component of a multi-disciplinary discharge planning process, led by the attending physician.  Recommendations may be updated based on patient status, additional functional criteria and insurance authorization.   Follow Up Recommendations  Home health OT;Supervision/Assistance - 24 hour (if family can provide current assist levels, if not SNF)    Equipment Recommendations  Tub/shower seat       Precautions / Restrictions Precautions Precautions: Fall Restrictions Weight Bearing Restrictions: No      Mobility Bed Mobility Overal bed mobility: Needs Assistance Bed Mobility: Supine to Sit     Supine to sit: Min assist;HOB elevated     General bed mobility comments: for trunk support, mod cues to sequence    Transfers Overall transfer level: Needs  assistance Equipment used: Rolling walker (2 wheeled) Transfers: Sit to/from Stand Sit to Stand: Min assist         General transfer comment: min to power up to standing, multimodal cues for sequencing using walker and to keep hands on frame of walker    Balance Overall balance assessment: Needs assistance Sitting-balance support: Feet supported Sitting balance-Leahy Scale: Fair     Standing balance support: Single extremity supported Standing balance-Leahy Scale: Poor Standing balance comment: hand on sink during g/h                           ADL either performed or assessed with clinical judgement   ADL Overall ADL's : Needs assistance/impaired     Grooming: Oral care;Wash/dry face;Wash/dry hands;Min guard;Standing Grooming Details (indicate cue type and reason): needing cues for sequencing, turning sink on/off Upper Body Bathing: Supervision/ safety;Sitting Upper Body Bathing Details (indicate cue type and reason): ' Lower Body Bathing: Moderate assistance;Sitting/lateral leans;Sit to/from stand   Upper Body Dressing : Supervision/safety;Sitting   Lower Body Dressing: Moderate assistance;Sitting/lateral leans;Sit to/from stand   Toilet Transfer: Minimal assistance;Cueing for safety;Cueing for sequencing;Ambulation;RW Toilet Transfer Details (indicate cue type and reason): mildly unsteady, max cues for sequencing and cues to keep hands on walker Toileting- Clothing Manipulation and Hygiene: Moderate assistance;Sitting/lateral lean;Sit to/from stand       Functional mobility during ADLs: Minimal assistance;Rolling walker;Cueing for sequencing;Cueing for safety General ADL Comments: patient needing increased assistance with self care tasks due to cognition/safety, balance, activity tolerance      Pertinent Vitals/Pain Pain Assessment: No/denies pain     Hand Dominance Right   Extremity/Trunk Assessment Upper  Extremity Assessment Upper Extremity  Assessment: Generalized weakness   Lower Extremity Assessment Lower Extremity Assessment: Defer to PT evaluation       Communication Communication Communication: No difficulties   Cognition Arousal/Alertness: Awake/alert Behavior During Therapy: WFL for tasks assessed/performed Overall Cognitive Status: No family/caregiver present to determine baseline cognitive functioning Area of Impairment: Orientation;Memory;Following commands;Safety/judgement;Problem solving                 Orientation Level: Disoriented to;Place;Time   Memory: Decreased short-term memory Following Commands: Follows one step commands with increased time Safety/Judgement: Decreased awareness of safety;Decreased awareness of deficits   Problem Solving: Difficulty sequencing;Requires verbal cues;Requires tactile cues General Comments: could not recall name of hospital or month after being told at beginning of session, difficulty side stepping to recliner chair needing max cues. also letting go of walker prematurely however this is novel device to patient              Home Living Family/patient expects to be discharged to:: Private residence Living Arrangements: Spouse/significant other Available Help at Discharge: Family Type of Home: House Home Access: Stairs to enter Secretary/administrator of Steps: 1   Home Layout: Able to live on main level with bedroom/bathroom     Bathroom Shower/Tub: Producer, television/film/video: Standard     Home Equipment: Environmental consultant - 2 wheels;Cane - single point          Prior Functioning/Environment Level of Independence: Independent        Comments: typically ambulatory at home without assistive device, able to perform ADLs        OT Problem List: Decreased strength;Decreased activity tolerance;Impaired balance (sitting and/or standing);Decreased cognition;Decreased safety awareness      OT Treatment/Interventions: Self-care/ADL training;Balance  training;Patient/family education;Cognitive remediation/compensation;Therapeutic activities    OT Goals(Current goals can be found in the care plan section) Acute Rehab OT Goals Patient Stated Goal: family hopes pt can return home with 24/7 care OT Goal Formulation: With patient/family Time For Goal Achievement: 07/02/21 Potential to Achieve Goals: Good  OT Frequency: Min 2X/week    AM-PAC OT "6 Clicks" Daily Activity     Outcome Measure Help from another person eating meals?: None Help from another person taking care of personal grooming?: A Little Help from another person toileting, which includes using toliet, bedpan, or urinal?: A Lot Help from another person bathing (including washing, rinsing, drying)?: A Lot Help from another person to put on and taking off regular upper body clothing?: A Little Help from another person to put on and taking off regular lower body clothing?: A Lot 6 Click Score: 16   End of Session Equipment Utilized During Treatment: Rolling walker Nurse Communication: Mobility status  Activity Tolerance: Patient tolerated treatment well Patient left: in chair;with call bell/phone within reach;with chair alarm set  OT Visit Diagnosis: Unsteadiness on feet (R26.81);Other abnormalities of gait and mobility (R26.89);Muscle weakness (generalized) (M62.81);Other symptoms and signs involving cognitive function                Time: 2505-3976 OT Time Calculation (min): 26 min Charges:  OT General Charges $OT Visit: 1 Visit OT Evaluation $OT Eval Low Complexity: 1 Low OT Treatments $Self Care/Home Management : 8-22 mins  Marlyce Huge OT OT pager: [phone]  Carmelia Roller 06/18/2021, 12:21 PM

## 2021-06-18 NOTE — Progress Notes (Addendum)
Patient had an uneventful day, some improvement with therapies. Her daughter noted patient was having some difficulty using utensils which seemed to be a change. Given history and plans for discharge in next 24 hours a CT was ordered for peace of mind, which surprisingly reveals a large acute PCA territory infarct, as well as other infarcts in the left thalamus, left cerebellar hemisphere which are new compared to previous imaging from May of this year and could be acute. 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 (Gas noted on the CT is residual tracked air from recent robotic paraesophageal hernia repair). Her plavix was held (1 week) leading up to surgery and was resumed 9/14. She has remained on prophylactic lovenox since 9/13. BP has been normo- to mildly hypertensive, with the exception of her initial BP in PREOP of 199/81, and one reading the afternoon of surgery up to 195/69. No prolonged hypotensive episodes intra-op as far as I can tell. No new neurologic deficits this afternoon per bedside RN.  I spoke with the neurologist on call at 4:30 and their team will evaluate her. Requested MRI brain and MRA head/neck to begin workup. Appreciate further recommendations. I have updated her husband and her daughter regarding CT results.

## 2021-06-18 NOTE — Progress Notes (Signed)
Physical Therapy Treatment Patient Details Name: Caroline [name] MRN: [id number] DOB: 28-Oct-1935 Today's Date: 06/18/2021   History of Present Illness Pt is an 85 year old female s/p status post robotic repair of paraesophageal hernia with gastropexy on 06/15/21 by Dr. Fredricka Bonine.  Pt with post operative lethargy and confusion; Suspect that she has some low-grade underlying dementia which has been exacerbated by undergoing general anesthesia.  PMHx: CVA, scoliosis, DVT, GERD, HTN    PT Comments    Pt was out of room earlier for CT HEAD Pt OOB in recliner with daughter in room.  General Comments: AxO x 2 pleasant, following functional commands, underlying dementia.  Assisted with amb a great distance in hallway.  General transfer comment: 75% VC's to push up from recliner vs pull up on walker.  50% VC's safety with turns. General Gait Details: verbal cues for RW positioning and posture (pt tends to keep RW too far forward and remain in trunk flexion however able to improve posture briefly with cues so provided frequent cues).  Then amb with her holding to just one side of walker as pt is use to using a cane.  Posture increased as pt was not looking down to guide walker. Per chart review, family plans to take pt home and provide 24/7 care.   Recommendations for follow up therapy are one component of a multi-disciplinary discharge planning process, led by the attending physician.  Recommendations may be updated based on patient status, additional functional criteria and insurance authorization.  Follow Up Recommendations  Home health PT (per chart review, family is planning to take her home and provide 24/7 assist)     Equipment Recommendations  None recommended by PT    Recommendations for Other Services       Precautions / Restrictions Precautions Precautions: Fall Restrictions Weight Bearing Restrictions: No     Mobility  Bed Mobility               General bed mobility comments: OOB  in recliner    Transfers Overall transfer level: Needs assistance Equipment used: Rolling walker (2 wheeled);1 person hand held assist Transfers: Sit to/from [employer] Sit to Stand: Min guard Stand pivot transfers: Min guard       General transfer comment: 75% VC's to push up from recliner vs pull up on walker.  50% VC's safety with turns.  Ambulation/Gait Ambulation/Gait assistance: Min guard Gait Distance (Feet): 150 Feet Assistive device: Rolling walker (2 wheeled);None Gait Pattern/deviations: Step-through pattern;Decreased stride length;Trunk flexed Gait velocity: decreased   General Gait Details: verbal cues for RW positioning and posture (pt tends to keep RW too far forward and remain in trunk flexion however able to improve posture briefly with cues so provided frequent cues).  Then amb with her holding to just one side of walker as pt is use to using a cane.  Posture increased as pt was not looking down to guide walker.   Stairs             Wheelchair Mobility    Modified Rankin (Stroke Patients Only)       Balance                                            Cognition Arousal/Alertness: Awake/alert Behavior During Therapy: WFL for tasks assessed/performed Overall Cognitive Status: Within Functional Limits for tasks assessed  General Comments: AxO x 2 pleasant, following functional commands, underlying dementia      Exercises      General Comments        Pertinent Vitals/Pain      Home Living                      Prior Function            PT Goals (current goals can now be found in the care plan section) Progress towards PT goals: Progressing toward goals    Frequency    Min 3X/week      PT Plan Current plan remains appropriate    Co-evaluation              AM-PAC PT "6 Clicks" Mobility   Outcome Measure  Help needed turning from  your back to your side while in a flat bed without using bedrails?: A Little Help needed moving from lying on your back to sitting on the side of a flat bed without using bedrails?: A Little Help needed moving to and from a bed to a chair (including a wheelchair)?: A Little Help needed standing up from a chair using your arms (e.g., wheelchair or bedside chair)?: A Little Help needed to walk in hospital room?: A Little Help needed climbing 3-5 steps with a railing? : A Little 6 Click Score: 18    End of Session Equipment Utilized During Treatment: Gait belt Activity Tolerance: Patient tolerated treatment well Patient left: in chair;with call bell/phone within reach;with chair alarm set;with family/visitor present Nurse Communication: Mobility status PT Visit Diagnosis: Difficulty in walking, not elsewhere classified (R26.2);Unsteadiness on feet (R26.81)     Time: 4680-3212 PT Time Calculation (min) (ACUTE ONLY): 18 min  Charges:  $Gait Training: 8-22 mins                     {Jamese Trauger  PTA Acute  Rehabilitation [employer]      [phone] Office      [phone]

## 2021-06-18 NOTE — Progress Notes (Signed)
S: No acute events overnight. She feels well this morning, no abdominal pain, no nausea, no dysphagia or reflux  O: Vitals, labs, intake/output, and orders reviewed at this time. Afebrile, no tachycardia, mildly hypertensive, sat 96% on RA. PO 300; UOP 620    Gen: Alert, pleasant, oriented to self. Not oriented to time, place or situation H&N: EOMI, atraumatic, neck supple Chest: unlabored respirations, RRR Abd: soft, nontender, nondistended, incision(s) c/d/i with dermabond, no cellulitis or hematoma Ext: warm, no edema Neuro: CN II-XII intact, strength 5/5 and symmetrical bilateral upper and lower extremities with no deficits. Speech is clear and coherent  Lines/tubes/drains: PIV  A/P: POD 3 s/p robotic paraesophageal hernia with gastropexy -Surgery was uneventful, postop upper GI as expected -Continue pured diet -Suspect she has some low-grade underlying dementia which has been exacerbated by undergoing anesthesia and is now having some delirium with sunsetting noted 9/13.  She did well overnight 9/14 with no events and is much more alert and appropriate this morning, though still disoriented.  -Continue delirium precautions (lights on, TV on, blinds open, keep awake and engaged throughout the day; at night from 10pm-6am keep lights off, TV off, blinds shut and minimize sleep interruptions)  She is much improved this morning. Her family is arranging 24h care at home and I think if this can be arranged and mental status continues to improve, she would be safest if able to go home back to her routine and usual environment as soon as possible, potentially even later today. Appreciate further input from therapies and CM.    Phylliss Blakes, MD [facility], [place]

## 2021-06-18 NOTE — TOC Progression Note (Signed)
Transition of Care [facility]) - Progression Note   Patient Details  Name: Caroline [name] MRN: [id number] Date of Birth: Oct 11, 1935  [facility] CM/SW Contact  Ewing Schlein, LCSW Phone Number: 06/18/2021, 1:18 PM  Clinical Narrative: CSW spoke with patient's daughter to follow up regarding family's plan for discharge. Per daughter, the paperwork for help at home was completed today and there will be family members at home to provide assistance through the next week as the patient transitions to in-home care.  Daughter asked about patient possibly going to SNF after discharge in case the in-home care does not work out. CSW explained that while TOC cannot set it up after discharge, the family set it up within 30 days of discharge if it is needed. CSW updated RN.  Expected Discharge Plan: Skilled Nursing Facility Barriers to Discharge: SNF Pending bed offer, Continued Medical Work up  Expected Discharge Plan and Services Expected Discharge Plan: Skilled Nursing Facility In-house Referral: Clinical Social Work Post Acute Care Choice: Skilled Nursing Facility Living arrangements for the past 2 months: Single Family Home            DME Arranged: N/A DME Agency: NA  Readmission Risk Interventions No flowsheet data found.

## 2021-06-19 ENCOUNTER — Inpatient Hospital Stay (HOSPITAL_COMMUNITY): Payer: Medicare Other

## 2021-06-19 DIAGNOSIS — I63532 Cerebral infarction due to unspecified occlusion or stenosis of left posterior cerebral artery: Secondary | ICD-10-CM

## 2021-06-19 DIAGNOSIS — I6389 Other cerebral infarction: Secondary | ICD-10-CM

## 2021-06-19 LAB — ECHOCARDIOGRAM COMPLETE BUBBLE STUDY
AR max vel: 2.93 cm2
AV Area VTI: 2.64 cm2
AV Area mean vel: 2.6 cm2
AV Mean grad: 5 mmHg
AV Peak grad: 10 mmHg
Ao pk vel: 1.58 m/s
Area-P 1/2: 3.65 cm2
Calc EF: 69.8 %
S' Lateral: 2.6 cm
Single Plane A2C EF: 66.5 %
Single Plane A4C EF: 77.8 %

## 2021-06-19 LAB — LIPID PANEL
Cholesterol: 216 mg/dL — ABNORMAL HIGH (ref 0–200)
HDL: 75 mg/dL
LDL Cholesterol: 123 mg/dL — ABNORMAL HIGH (ref 0–99)
Total CHOL/HDL Ratio: 2.9 ratio
Triglycerides: 88 mg/dL
VLDL: 18 mg/dL (ref 0–40)

## 2021-06-19 LAB — HEMOGLOBIN A1C
Hgb A1c MFr Bld: 5.5 % (ref 4.8–5.6)
Mean Plasma Glucose: 111.15 mg/dL

## 2021-06-19 MED ORDER — ASPIRIN EC 81 MG PO TBEC
81.0000 mg | DELAYED_RELEASE_TABLET | Freq: Every day | ORAL | Status: DC
  Administered 2021-06-19 – 2021-06-24 (×6): 81 mg via ORAL
  Filled 2021-06-19 (×6): qty 1

## 2021-06-19 MED ORDER — ATORVASTATIN CALCIUM 40 MG PO TABS
40.0000 mg | ORAL_TABLET | Freq: Every day | ORAL | Status: DC
  Administered 2021-06-19: 40 mg via ORAL
  Filled 2021-06-19: qty 1

## 2021-06-19 MED ORDER — ATORVASTATIN CALCIUM 40 MG PO TABS
80.0000 mg | ORAL_TABLET | Freq: Every day | ORAL | Status: DC
  Administered 2021-06-20 – 2021-06-23 (×4): 80 mg via ORAL
  Filled 2021-06-19 (×4): qty 2

## 2021-06-19 MED ORDER — ASPIRIN 81 MG PO TBEC: 81.0000 mg | DELAYED_RELEASE_TABLET | Freq: Every day | ORAL | 11 refills | Status: DC

## 2021-06-19 MED ORDER — IRBESARTAN 150 MG PO TABS
150.0000 mg | ORAL_TABLET | Freq: Every day | ORAL | Status: DC
  Administered 2021-06-20 – 2021-06-24 (×5): 150 mg via ORAL
  Filled 2021-06-19 (×5): qty 1

## 2021-06-19 MED ORDER — ATORVASTATIN CALCIUM 80 MG PO TABS: 80.0000 mg | ORAL_TABLET | Freq: Every day | ORAL | 2 refills | Status: DC

## 2021-06-19 MED ORDER — POLYVINYL ALCOHOL 1.4 % OP SOLN
1.0000 [drp] | OPHTHALMIC | Status: DC | PRN
  Administered 2021-06-19 – 2021-06-23 (×3): 1 [drp] via OPHTHALMIC
  Filled 2021-06-19 (×3): qty 15

## 2021-06-19 MED ORDER — ASPIRIN EC 81 MG PO TBEC: 81.0000 mg | DELAYED_RELEASE_TABLET | Freq: Every day | ORAL | Status: DC

## 2021-06-19 MED ORDER — ACETAMINOPHEN 325 MG PO TABS
650.0000 mg | ORAL_TABLET | Freq: Four times a day (QID) | ORAL | Status: DC | PRN
  Administered 2021-06-23: 650 mg via ORAL
  Filled 2021-06-19: qty 2

## 2021-06-19 NOTE — Consult Note (Signed)
Neurology Consult H&P  Caroline [name] MR# [id number] 06/19/2021   CC: acute stroke  History is obtained from: Staff, patient and chart  HPI: Caroline [name] is a 85 y.o. female PMHx as reviewed below admitted for symptomatic paraesophageal hernia s/p repair 06/15/2021 and post operative day 1 developed lethargy and confusion and delirium precautions were started and narcotics were stopped. There was suspicion that there was an underlying low-grade dementia exacerbated by procedure. The patient continued to be somnolent and disoriented. Clopidogrel was ordered for the morning of 06/17/2021 and NCT head on 06/18/2021  showed acute left PCA stroke.  Regarding her prior stroke history, she is followed by Dr. Delia Heady who notes she has had a prior right thalamic stroke (May 2019) secondary to small vessel disease with vascular risk factors of hypertension, hyperlipidemia and prior stroke.  She has been on Plavix for secondary stroke prevention as well as atorvastatin 40 mg  LKW: unclear tpa given: No OSW IR Thrombectomy No, not a candidate Modified Rankin Scale: 1-No significant post stroke disability and can perform usual duties with stroke symptoms NIHSS: 4  ROS: Unable to assess fully due to encephalopathy, but patient denies any acute complaints, including headache, though she notes that her right eye is bothering her Daughter describes some mild neglect of the right side of the world, for example the patient is preferentially using her left hand to do tasks although she is right-handed.  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 Cheek   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   Bridge Left Nose, and Left Neck   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 Cheek Tx: curret x 3 and 5FU   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   Bridge of nose tx Cx3 and 5FU, Left neck Tx Cx3 and 5FU   Thrombophlebitis    Current Outpatient Medications  Medication Instructions   acetaminophen (TYLENOL) 1,300 mg, Oral, Every 8 hours PRN   atorvastatin (LIPITOR) 40 mg, Oral, Daily   clopidogrel (PLAVIX) 75 mg, Daily   Coenzyme Q10 (COQ10 PO) 1 tablet, Oral, Daily   hydrochlorothiazide (MICROZIDE) 12.5 mg, Oral, Daily PRN   irbesartan (AVAPRO) 150 mg, Oral, Daily   levothyroxine (SYNTHROID) 112 mcg, Oral, Daily   Omega-3 Fatty Acids (FISH OIL PO) 1,000 mg, Oral, Daily   ondansetron (ZOFRAN-ODT) 4 mg, Oral, Every 8 hours PRN   pantoprazole (PROTONIX) 40 MG tablet TAKE 1 TABLET BY MOUTH 2 TIMES DAILY BEFORE A MEAL.   Potassium 99 mg, Oral, Daily   QUEtiapine (SEROQUEL) 25 mg, Oral, Daily at bedtime   simethicone (MYLICON) 125 mg, Oral, Every 6 hours PRN   traMADol (ULTRAM) 25 mg, Every 6 hours PRN   vitamin  C 100 mg, Oral, Every other day   Vitamin D-3 1,000 Units, Oral, Daily    Current Facility-Administered Medications:    acetaminophen (TYLENOL) tablet 1,000 mg, 1,000 mg, Oral, Q6H PRN, Phylliss Blakes A, MD, 1,000 mg at 06/18/21 2228   bisacodyl (DULCOLAX) suppository 10 mg, 10 mg, Rectal, Daily PRN, Phylliss Blakes A, MD   clopidogrel (PLAVIX) tablet 75 mg, 75 mg, Oral, Daily, Phylliss Blakes A, MD, 75 mg at 06/19/21 1013   docusate sodium (COLACE) capsule 100 mg, 100 mg, Oral, BID, Phylliss Blakes A, MD, 100 mg at 06/19/21 1013   enoxaparin (LOVENOX) injection 40  mg, 40 mg, Subcutaneous, Q24H, Phylliss Blakes A, MD, 40 mg at 06/19/21 7353   feeding supplement (BOOST / RESOURCE BREEZE) liquid 1 Container, 1 Container, Oral, TID BM, Berna Bue, MD, 1 Container at 06/19/21 1014   hydrALAZINE (APRESOLINE) injection 10 mg, 10 mg, Intravenous, Q2H PRN, Phylliss Blakes A, MD, 10 mg at 06/15/21 1706   irbesartan (AVAPRO) tablet 75 mg, 75 mg, Oral, Daily, Phylliss Blakes A, MD, 75 mg at 06/19/21 1013   ketorolac (TORADOL) 15 MG/ML injection 15 mg, 15 mg, Intravenous, Q8H PRN, Berna Bue, MD   levothyroxine (SYNTHROID) tablet 112 mcg, 112 mcg, Oral, Q0600, Phylliss Blakes A, MD, 112 mcg at 06/19/21 0553   metoprolol tartrate (LOPRESSOR) injection 5 mg, 5 mg, Intravenous, Q6H PRN, Phylliss Blakes A, MD   ondansetron (ZOFRAN-ODT) disintegrating tablet 4 mg, 4 mg, Oral, Q6H PRN **OR** ondansetron (ZOFRAN) injection 4 mg, 4 mg, Intravenous, Q6H PRN, Fredricka Bonine, Chelsea A, MD   pantoprazole (PROTONIX) injection 40 mg, 40 mg, Intravenous, QHS, Phylliss Blakes A, MD, 40 mg at 06/18/21 2227   QUEtiapine (SEROQUEL) tablet 25 mg, 25 mg, Oral, QHS, Berna Bue, MD, 25 mg at 06/18/21 2235   Family History  Problem Relation Age of Onset   Arthritis Mother        pt denies   Hypertension Mother        pt denies   Obesity Sister    Cancer Maternal Uncle        type unknown   Colon cancer Neg Hx    Stomach cancer Neg Hx    Esophageal cancer Neg Hx    Pancreatic cancer Neg Hx     Social History:  reports that she has never smoked. She has never used smokeless tobacco. She reports that she does not currently use drugs. She reports that she does not drink alcohol.   Prior to Admission medications   Medication Sig Start Date End Date Taking? Authorizing Provider  acetaminophen (TYLENOL) 650 MG CR tablet Take 1,300 mg by mouth every 8 (eight) hours as needed for pain.   Yes [provider]  Ascorbic Acid (VITAMIN C) 100 MG tablet Take 100 mg by  mouth every other day.    Yes [provider]  atorvastatin (LIPITOR) 40 MG tablet TAKE 1 TABLET (40 MG TOTAL) BY MOUTH DAILY. 04/14/21  Yes Micki Riley, MD  Cholecalciferol (VITAMIN D-3) 1000 units CAPS Take 1,000 Units by mouth daily.   Yes [provider]  Coenzyme Q10 (COQ10 PO) Take 1 tablet by mouth daily.   Yes [provider]  hydrochlorothiazide (MICROZIDE) 12.5 MG capsule Take 12.5 mg by mouth daily as needed (fluid).   Yes [provider]  irbesartan (AVAPRO) 150 MG tablet Take 150 mg by mouth daily.  10/11/17  Yes [provider]  levothyroxine (SYNTHROID) 112 MCG tablet Take  112 mcg by mouth daily. 05/19/21  Yes [provider]  Omega-3 Fatty Acids (FISH OIL PO) Take 1,000 mg by mouth daily.   Yes [provider]  ondansetron (ZOFRAN-ODT) 4 MG disintegrating tablet Take 4 mg by mouth every 8 (eight) hours as needed for nausea or vomiting. 02/18/21  Yes [provider]  pantoprazole (PROTONIX) 40 MG tablet TAKE 1 TABLET BY MOUTH 2 TIMES DAILY BEFORE A MEAL. Patient taking differently: Take 40 mg by mouth 2 (two) times daily before a meal. 02/24/21  Yes Danis, Starr [place] III, MD  Potassium 99 MG TABS Take 99 mg by mouth daily.   Yes [provider]  simethicone (MYLICON) 125 MG chewable tablet Chew 125 mg by mouth every 6 (six) hours as needed for flatulence.   Yes [provider]  clopidogrel (PLAVIX) 75 MG tablet Take 75 mg by mouth daily. Patient not taking: No sig reported    [provider]  QUEtiapine (SEROQUEL) 25 MG tablet Take 1 tablet (25 mg total) by mouth at bedtime for 5 days. 06/18/21 06/23/21  Berna Bue, MD  traMADol (ULTRAM) 50 MG tablet Take 25 mg by mouth every 6 (six) hours as needed for moderate pain. Patient not taking: No sig reported    [provider]   Exam: Current vital signs: BP (!) 162/69 (BP Location: Right Arm)   Pulse 84   Temp 97.9 F (36.6  C) (Oral)   Resp 18   Ht 5\' 4"  (1.626 m)   Wt 63.1 kg   SpO2 94%   BMI 23.88 kg/m   Physical Exam  Constitutional: Appears well-developed and well-nourished.  Psych: Affect appropriate to situation Eyes: No scleral injection HENT: No OP obstruction. Head: Normocephalic.  Cardiovascular: Normal rate and regular rhythm.  Respiratory: Effort normal, symmetric excursions bilaterally, no audible wheezing. GI: Soft.  No distension. There is no tenderness.  Skin: WDI  Neuro: Mental Status: Patient is awake, alert, oriented to person, place, month, but not year, and situation. Patient is able to give a some limited limited history. Speech fluent, intact comprehension and repetition, but occasionally slow to respond answers with a general category instead of exact word Visual Fields are notable for a right hemianopia. Pupils are equal, round, and reactive to light. EOM with a left gaze preference but she is able to cross fully to the right with significant encouragement without ptosis or diploplia.  Facial sensation is symmetric to light touch Facial movement is symmetric.  Hearing is intact to voice. Uvula midline and palate elevates symmetrically. Shoulder shrug is symmetric. Tongue is midline without atrophy or fasciculations.  Tone is normal. Bulk is normal. 5/5 strength was present in all four extremities. Sensation is symmetric to light touch and temperature in the arms and legs. Deep Tendon Reflexes: 2+ and symmetric in the biceps and patellae. Toes are downgoing bilaterally. FNF and HKS are intact bilaterally. Gait - Deferred  I have reviewed labs in epic and the pertinent results are:  Ref. Range 06/16/2021 03:53  Sodium Latest Ref Range: 135 - 145 mmol/L 134 (L)   Lab Results  Component Value Date   CHOL 216 (H) 06/19/2021   HDL 75 06/19/2021   LDLCALC 123 (H) 06/19/2021   TRIG 88 06/19/2021   CHOLHDL 2.9 06/19/2021   Lab Results  Component Value Date   HGBA1C  5.5 06/19/2021     I have reviewed the images obtained: NCT head showed Large acute left posterior cerebral artery territory  cortical/subcortical infarct. Local mass effect with partial effacement of the left lateral ventricle. No midline shift. No evidence of hemorrhagic conversion. Additional infarct within the left thalamus, new from the brain MRI of 02/11/2021, and possibly acute. Lacunar infarct within the left cerebellar hemisphere, also new from this prior examination and possibly acute. Redemonstrated chronic lacunar infarcts within the bilateral basal ganglia and right thalamus.  MRI brain, MRA head and neck personally reviewed, agree with radiology: 1. Large acute left PCA territory infarct without hemorrhage or mass effect. 2. Occlusion of the left PCA at the distal P2 segment. 3. Severe atrophy and chronic small vessel ischemia. 4. Mild narrowing of the right carotid bifurcation without hemodynamically significant stenosis by NASCET criteria.  Assessment: Caroline [name] is a 85 y.o. female PMHx as above with acute periprocedural large territory left PCA embolic stroke, undetermined source at this time. She is outside the window for acute intervention and will need to complete the stroke workup and imaging studies are pending.  I do feel that the patient would benefit from rehabilitation given she was living independently at baseline and now has significant difficulty orienting to the right side of the world which presents a significant safety risk.  Plan: - MRI brain without contrast - completed as above, reviewed with daughter at bedside - Recommend vascular imaging with MRA head and neck - completed as above, reviewed with daughter at bedside - Recommend TTE - Ordered. - Recommend labs: HbA1c, lipid panel - Ordered.   - Goal A1c less than 7%, - Atorvastatin increase from 40 mg to 80 mg to achieve goal LDL > 70 - Aspirin 81mg  daily for 21-day course in addition to Plavix 75 mg daily,  to be stopped in favor of anticoagulation if indication for anticoagulation found - Blood pressure goal < 140/90.  - Telemetry monitoring for arrhythmia.  If echocardiogram does not reveal a source of stroke patient would be a candidate for loop recorder placement given embolic stroke of unknown source, which could be completed on an outpatient basis - Recommend bedside Swallow screen. - Recommend Stroke education. - Recommend PT/OT/SLP consult, for assessment of safe return to prior level of function, potentially will need rehabilitation - Neurology will follow up on echocardiogram read and otherwise will be available on an as-needed basis going forward.  Please reach out if any questions or concerns arise - Continue outpatient follow-up with Dr. MD-PhD [facility] [phone]  Available 7 PM to 7 AM, outside of these hours please call Neurologist on call as listed on Amion.  Extensive discussion with patient and family regarding work-up and management of her acute stroke

## 2021-06-19 NOTE — TOC Progression Note (Signed)
Transition of Care [facility]) - Progression Note   Patient Details  Name: Jessiah Wojnar MRN: [id number] Date of Birth: [birth year]  [facility]) CM/SW Contact  Ewing Schlein, LCSW Phone Number: 06/19/2021, 11:41 AM  Clinical Narrative: CSW spoke with patient's daughter, Tresa Endo, to discuss rehab. Per daughter, the family wants "stroke rehab" rather than SNF at this time. PT to assist with CIR referral/recommendation. CSW explained to daughter that due to patient's insurance and how far she walked with PT yesterday, there is a possibility that patient will not be approved for CIR or SNF at this time. TOC awaiting CIR screening.  Expected Discharge Plan: Skilled Nursing Facility Barriers to Discharge: SNF Pending bed offer, Continued Medical Work up  Expected Discharge Plan and Services Expected Discharge Plan: Skilled Nursing Facility In-house Referral: Clinical Social Work Post Acute Care Choice: Skilled Nursing Facility Living arrangements for the past 2 months: Single Family Home           DME Arranged: N/A DME Agency: NA  Readmission Risk Interventions No flowsheet data found.

## 2021-06-19 NOTE — Progress Notes (Signed)
  Echocardiogram 2D Echocardiogram has been performed.  Caroline [name] 06/19/2021, 4:21 PM

## 2021-06-19 NOTE — Progress Notes (Signed)
Patient transferred to room 1513. No c/o pain of discomfort. Oriented to room and call bell. Educated on calling for assistance and bed alarm.

## 2021-06-19 NOTE — Progress Notes (Signed)
S: No acute events overnight. Feels well this morning.   O: Vitals, labs, intake/output, and orders reviewed at this time.    Alert, pleasant, answers questions but disoriented to place/time and repetitive questioning No new neuro deficits Abdomen soft, nontender. Incisions c/d/i  Lines/tubes/drains: PIV  A/P: POD 4 s/p robotic paraesophageal hernia with gastropexy -Surgery was uneventful, postop upper GI as expected -Continue pured diet -Suspect she has some low-grade underlying dementia which has been exacerbated by undergoing anesthesia and is now having some delirium with sunsetting noted 9/13.  -Continue delirium precautions (lights on, TV on, blinds open, keep awake and engaged throughout the day; at night from 10pm-6am keep lights off, TV off, blinds shut and minimize sleep interruptions) -CT 9/15 demonstrates new PCA infarct- see note from yesterday PM. Awaiting further input from neurology.  -PT/OT/SLP, dispo planning. Her daughter feels that rehab at least briefly in the early stroke recovery period would be beneficial since she lives at home with her husband.  From surgical standpoint she can be discharged when deemed suitable by neurology and disposition is arranged.     Phylliss Blakes, MD [facility], [place]

## 2021-06-19 NOTE — Progress Notes (Addendum)
Physical Therapy Treatment Patient Details Name: Caroline [name] MRN: [id number] DOB: Feb 08, 1936 Today's Date: 06/19/2021   History of Present Illness Pt is an 85 year old female s/p status post robotic repair of paraesophageal hernia with gastropexy on 06/15/21 by Dr. Fredricka Bonine.  Pt with post operative lethargy and confusion;        MRI =  Large acute left PCA territory infarct without hemorrhage or mass  effect.  also per MD notes  Suspect that she has some low-grade underlying dementia which has been exacerbated by undergoing general anesthesia.  PMHx: CVA, scoliosis, DVT, GERD, HTN    PT Comments    Pt seen today for mobility/balance. Pt is very pleasant and cooperative with PT, dtr present for session.  Pt demonstrates incr difficulty with amb and is at incr risk for falls d/t decr balance, decr cognition and limited vision R eye. presents with difficulty maneuvering around and reading signs/performing visual scanning to R and L while amb. Pt would benefit from CIR, pt and dtr both express interest in post acute rehab. Continue PT acute POC  Recommendations for follow up therapy are one component of a multi-disciplinary discharge planning process, led by the attending physician.  Recommendations may be updated based on patient status, additional functional criteria and insurance authorization.  Follow Up Recommendations  CIR     Equipment Recommendations  None recommended by PT    Recommendations for Other Services       Precautions / Restrictions Precautions Precautions: Fall Restrictions Weight Bearing Restrictions: No     Mobility  Bed Mobility               General bed mobility comments: OOB in recliner    Transfers Overall transfer level: Needs assistance Equipment used: Rolling walker (2 wheeled);1 person hand held assist Transfers: Sit to/from Stand Sit to Stand: Min guard         General transfer comment: verbal cues to push up from recliner, transitions to RW  with min/guard for safety  Ambulation/Gait Ambulation/Gait assistance: Min guard;Min assist Gait Distance (Feet): hallway ambulation Assistive device: Rolling walker (2 wheeled);1 person hand held assist Gait Pattern/deviations: Step-through pattern;Decreased stride length;Trunk flexed;Narrow base of support Gait velocity: decreased   General Gait Details: multi-modal cues for RW proximity to self, to incr hip and trunk extension. intermittent min assist to maneuver RW around objects as well as verbal cues for visual scanning R and L; fatigues with incr distance.   Stairs             Wheelchair Mobility    Modified Rankin (Stroke Patients Only)       Balance Overall balance assessment: Needs assistance Sitting-balance support: Feet supported Sitting balance-Leahy Scale: Fair Sitting balance - Comments: lateral trunks shift , difficulty wt shifting   Standing balance support: Single extremity supported Standing balance-Leahy Scale: Fair Standing balance comment: is abel to briefly stand without UE support however reliant on at least unilateral UE support for dynamic tasks                            Cognition Arousal/Alertness: Awake/alert Behavior During Therapy: WFL for tasks assessed/performed Overall Cognitive Status: Impaired/Different from baseline Area of Impairment: Orientation;Memory;Following commands;Safety/judgement;Problem solving                 Orientation Level: Disoriented to;Place   Memory: Decreased short-term memory Following Commands: Follows one step commands with increased time;Follows multi-step commands inconsistently Safety/Judgement: Decreased  awareness of safety;Decreased awareness of deficits   Problem Solving: Difficulty sequencing;Requires verbal cues;Requires tactile cues General Comments: AxO x 2 pleasant, following functional commands, underlying dementia      Exercises      General Comments         Pertinent Vitals/Pain Pain Assessment: No/denies pain    Home Living                      Prior Function            PT Goals (current goals can now be found in the care plan section) Acute Rehab PT Goals Patient Stated Goal: family hopes pt can return home with 24/7 care PT Goal Formulation: With family Time For Goal Achievement: 07/01/21 Potential to Achieve Goals: Good Progress towards PT goals: Progressing toward goals    Frequency    Min 3X/week      PT Plan Current plan remains appropriate    Co-evaluation              AM-PAC PT "6 Clicks" Mobility   Outcome Measure  Help needed turning from your back to your side while in a flat bed without using bedrails?: A Little Help needed moving from lying on your back to sitting on the side of a flat bed without using bedrails?: A Little Help needed moving to and from a bed to a chair (including a wheelchair)?: A Little Help needed standing up from a chair using your arms (e.g., wheelchair or bedside chair)?: A Little Help needed to walk in hospital room?: A Little Help needed climbing 3-5 steps with a railing? : A Little 6 Click Score: 18    End of Session Equipment Utilized During Treatment: Gait belt Activity Tolerance: Patient tolerated treatment well Patient left: in chair;with call bell/phone within reach;with chair alarm set;with family/visitor present   PT Visit Diagnosis: Difficulty in walking, not elsewhere classified (R26.2);Unsteadiness on feet (R26.81)     Time: 9242-6834 PT Time Calculation (min) (ACUTE ONLY): 25 min  Charges:  $Gait Training: 23-37 mins                     Delice Bison, PT  Acute Rehab Dept ([facility]) [phone] Pager [phone]  06/19/2021    [facility] 06/19/2021, 11:43 AM

## 2021-06-19 NOTE — Evaluation (Signed)
Speech Language Pathology Evaluation Patient Details Name: Caroline [name] MRN: [id number] DOB: [birth year] Today's Date: 06/19/2021 Time:  -     Problem List:  Patient Active Problem List   Diagnosis Date Noted   Delirium due to multiple etiologies 06/17/2021   S/P repair of paraesophageal hernia 06/15/2021   CVA (cerebral vascular accident) (HCC) 02/09/2018   Hypothyroidism 02/08/2018   Essential hypertension 02/08/2018   Acute ischemic stroke (HCC) 02/08/2018   Acute lower UTI 02/08/2018   Acute cystitis without hematuria    Right thalamic stroke (HCC)    Hyperlipidemia    Past Medical History:  Past Medical History:  Diagnosis Date   Allergy    Anxiety    Arthritis    Cataract    Celiac sprue    CVA (cerebral vascular accident) (HCC) 02/2018   R thalamic CVA   DVT (deep vein thrombosis) in pregnancy    GERD (gastroesophageal reflux disease)    Glaucoma    Hormone replacement therapy (HRT)    Hypercholesteremia    Hyperlipidemia    Hypertension    Hypoglycemia    Hypothyroidism    OAB (overactive bladder)    Osteoporosis    PONV (postoperative nausea and vomiting)    Raynaud's disease    Scoliosis    Seasonal allergies    Senile purpura (HCC)    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 Cheek   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   Bridge Left Nose, and Left Neck   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 Cheek Tx: curret x 3 and 5FU   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   Bridge of nose tx Cx3 and 5FU, Left neck Tx Cx3 and 5FU   Thrombophlebitis    Past Surgical History:  Past Surgical History:  Procedure Laterality Date   ABDOMINAL HYSTERECTOMY  1979   CATARACT EXTRACTION, BILATERAL     knee surgery Left    VARICOSE VEIN SURGERY Left    XI ROBOTIC ASSISTED PARAESOPHAGEAL HERNIA REPAIR N/A 06/15/2021   Procedure: XI ROBOTIC ASSISTED PARAESOPHAGEAL HERNIA REPAIR, GASTROPEXY, UPPER ENDOSCOPY;   Surgeon: Berna Bue, MD;  Location: [facility];  Service: General;  Laterality: N/A;   HPI:  Caroline [name] is a 85 y.o. female PMHx as reviewed below admitted for symptomatic paraesophageal hernia s/p repair 06/15/2021 and post operative day 1 developed lethargy and confusion and delirium precautions were started and narcotics were stopped. There was suspicion that there was an underlying low-grade dementia exacerbated by procedure. The patient continued to be somnolent and disoriented. Clopidogrel was ordered for the morning of 06/17/2021 and NCT head on 06/18/2021  showed acute left PCA stroke.   Assessment / Plan / Recommendation Clinical Impression  Pt demonstrates significant memory and awareness impairment following second stroke; pt unable to provide basic biographical information such as address, naming her children at times. She repeatedly asks if she should drive despite successfully reasoning through why she should not with min questions cues. She has a right field visual impairment, cannot identify even large pictures due to probable field cut. She is right hand dominant and struggles with basic right handed tasks. Pt would benefit from short term rehabiltiation to address cognitive and functional tasks for safety with supervision assist later at home.    SLP Assessment  SLP Recommendation/Assessment: Patient needs continued Speech Lanaguage Pathology Services    Recommendations for follow up therapy are one component of a multi-disciplinary discharge  planning process, led by the attending physician.  Recommendations may be updated based on patient status, additional functional criteria and insurance authorization.    Follow Up Recommendations  Skilled Nursing facility    Frequency and Duration min 2x/week  2 weeks      SLP Evaluation Cognition  Overall Cognitive Status: Impaired/Different from baseline Arousal/Alertness: Awake/alert Orientation Level: Oriented to person;Oriented  to place;Oriented to situation;Disoriented to time Attention: Focused;Sustained Focused Attention: Appears intact Sustained Attention: Appears intact Memory: Impaired Memory Impairment: Storage deficit;Retrieval deficit;Decreased long term memory;Decreased short term memory Decreased Long Term Memory: Verbal basic Decreased Short Term Memory: Verbal basic Awareness: Impaired Awareness Impairment: Intellectual impairment;Emergent impairment Problem Solving: Impaired Problem Solving Impairment: Verbal basic;Functional basic Safety/Judgment: Impaired       Comprehension  Auditory Comprehension Overall Auditory Comprehension: Appears within functional limits for tasks assessed    Expression Verbal Expression Overall Verbal Expression: Appears within functional limits for tasks assessed Written Expression Dominant Hand: Right   Oral / Motor  Oral Motor/Sensory Function Overall Oral Motor/Sensory Function: Within functional limits Motor Speech Overall Motor Speech: Appears within functional limits for tasks assessed   GO                    Thi Klich, Riley Nearing 06/19/2021, 2:31 PM

## 2021-06-20 NOTE — Progress Notes (Signed)
S: No acute events overnight. Feels well this morning.   O: Vitals, labs, intake/output, and orders reviewed at this time.    Alert, pleasant, answers questions  Abdomen soft, nontender. Incisions c/d/i  Lines/tubes/drains: PIV  A/P: POD 5 s/p robotic paraesophageal hernia with gastropexy -Surgery was uneventful, postop upper GI as expected -Continue pured diet -Continue delirium precautions (lights on, TV on, blinds open, keep awake and engaged throughout the day; at night from 10pm-6am keep lights off, TV off, blinds shut and minimize sleep interruptions) -CT 9/15 demonstrated new PCA infarct- Neurology consulted and recs completed. Will await further recs, now that Echo is complete -PT/OT/SLP, dispo planning. CIR consult placed, waiting for evaluation From surgical standpoint she can be discharged when deemed suitable by neurology and disposition is arranged.     Vanita Panda, MD  Colorectal and General Surgery [facility]

## 2021-06-20 NOTE — Progress Notes (Signed)
Occupational Therapy Treatment Patient Details Name: Caroline [name] MRN: [id number] DOB: July 30, 1936 Today's Date: 06/20/2021   History of present illness Pt is an 85 year old female s/p status post robotic repair of paraesophageal hernia with gastropexy on 06/15/21 by Dr. Fredricka Bonine.  Pt with post operative lethargy and confusion;        MRI =  Large acute left PCA territory infarct without hemorrhage or mass  effect.  also per MD notes  Suspect that she has some low-grade underlying dementia which has been exacerbated by undergoing general anesthesia.  PMHx: CVA, scoliosis, DVT, GERD, HTN   OT comments  Patient was educated on scanning techniques to increase patients independence in ADL tasks and to reduce risk of injury during functional mobility in environment. Patient required continued multimodal cues to apply education to task. Patient was min A for functional mobility to bathroom to avoid obstacles in room, and to keep walker close to her. Patient was min A for standing balance and mod A to transfer off commode. Patient's family reported they are currently unable to take patient home at current assist levels.  Patient would continue to benefit from skilled OT services at this time while admitted and after d/c to address noted deficits in order to improve overall safety and independence in ADLs.     Recommendations for follow up therapy are one component of a multi-disciplinary discharge planning process, led by the attending physician.  Recommendations may be updated based on patient status, additional functional criteria and insurance authorization.    Follow Up Recommendations  CIR    Equipment Recommendations  Tub/shower seat    Recommendations for Other Services      Precautions / Restrictions Precautions Precautions: Fall Precaution Comments: possible visual neglect R side Restrictions Weight Bearing Restrictions: No       Mobility Bed Mobility               General bed  mobility comments: OOB in recliner    Transfers Overall transfer level: Needs assistance Equipment used: Rolling walker (2 wheeled) Transfers: Sit to/from Stand Sit to Stand: Min guard         General transfer comment: verbal cues to push up from recliner, transitions to RW with min/guard for safety    Balance Overall balance assessment: Needs assistance Sitting-balance support: Feet supported Sitting balance-Leahy Scale: Fair     Standing balance support: Single extremity supported Standing balance-Leahy Scale: Fair                             ADL either performed or assessed with clinical judgement   ADL Overall ADL's : Needs assistance/impaired                         Toilet Transfer: Minimal assistance;RW;Ambulation Toilet Transfer Details (indicate cue type and reason): patient needed consistent multimodal cues to keep walker close, navigate obstacles and prevent walking into items on L and right sides in room. patients husband was present during session Toileting- Clothing Manipulation and Hygiene: Minimal assistance;Sit to/from stand;Cueing for sequencing;Cueing for safety       Functional mobility during ADLs: Minimal assistance;Rolling walker;Cueing for safety;Cueing for sequencing General ADL Comments: patient needed increased time and cues to participate in all tasks. patients scanning was assessed with theraputic activity with patient noted to ignore R side of paper with continued education on scanning page from edge to edge. patient was noted  to circle letters above and to L than on top of letters especially ones on R side of page. patient was asked to draw clock with patient unable to recall numbers on clock stopping at 10 on first one. second one patient was re eduacte don scanning page and finding all areas first. patient was unable to place numbers in appropriate sequence starting on opposite side of clock, not spaces approraite and out of  order at end with patient saying 11 but writing 12. husband was educated on speaking with case manager for resources for transitioning back home     Vision Baseline Vision/History: 1 Wears glasses Ability to See in Adequate Light: 2 Moderately impaired Additional Comments: patient was noted to have difficult time identifying letters on page especailly with scanning to R side of page.   Perception     Praxis      Cognition Arousal/Alertness: Awake/alert Behavior During Therapy: WFL for tasks assessed/performed Overall Cognitive Status: Impaired/Different from baseline Area of Impairment: Orientation;Memory;Following commands;Safety/judgement;Problem solving                 Orientation Level: Disoriented to;Place;Time   Memory: Decreased short-term memory Following Commands: Follows one step commands with increased time;Follows multi-step commands inconsistently Safety/Judgement: Decreased awareness of safety;Decreased awareness of deficits   Problem Solving: Difficulty sequencing;Requires verbal cues;Requires tactile cues General Comments: patient was unable to recall year with patient oriented 5 mins later patient continued to not remember year even with problem solving to attempt to figure year out.        Exercises     Shoulder Instructions       General Comments      Pertinent Vitals/ Pain       Pain Assessment: No/denies pain  Home Living                                          Prior Functioning/Environment              Frequency  Min 2X/week        Progress Toward Goals  OT Goals(current goals can now be found in the care plan section)  Progress towards OT goals: Progressing toward goals  Acute Rehab OT Goals Patient Stated Goal: family hopes pt can return home with 24/7 care  Plan Discharge plan needs to be updated    Co-evaluation                 AM-PAC OT "6 Clicks" Daily Activity     Outcome Measure    Help from another person eating meals?: None Help from another person taking care of personal grooming?: A Little Help from another person toileting, which includes using toliet, bedpan, or urinal?: A Lot Help from another person bathing (including washing, rinsing, drying)?: A Lot Help from another person to put on and taking off regular upper body clothing?: A Little Help from another person to put on and taking off regular lower body clothing?: A Lot 6 Click Score: 16    End of Session Equipment Utilized During Treatment: Rolling walker;Gait belt  OT Visit Diagnosis: Unsteadiness on feet (R26.81);Other abnormalities of gait and mobility (R26.89);Muscle weakness (generalized) (M62.81);Other symptoms and signs involving cognitive function   Activity Tolerance Patient tolerated treatment well   Patient Left in chair;with call bell/phone within reach;with chair alarm set   Nurse Communication Mobility status  Time: 3664-4034 OT Time Calculation (min): 42 min  Charges: OT General Charges $OT Visit: 1 Visit OT Treatments $Self Care/Home Management : 38-52 mins  Sharyn Blitz OTR/L, MS Acute Rehabilitation Department Office# [phone] Pager# [phone]   Chalmers Guest Daundre Biel 06/20/2021, 3:08 PM

## 2021-06-20 NOTE — Plan of Care (Signed)
Neurology plan of care:  Stroke workup is finished. LDL was 123 and her Lipitor was increased to 80mg . She is on DAPT x 21 days, then just ASA 81mg  alone. Echo showed no source for embolic stroke. A1c 5.5%. SLP recommended SNF and PT/OT recommend home health.   Plan/Recommendations: -Continue Plavix 75mg  po qd ongoing.  -ASA 81mg  po qd for 21 days, then Plavix alone.  -Loop recorder or event monitor out patient to look for etiology of embolic stroke.  -Continue Lipitor 80mg  po qd. -f/up with Dr. out patient in 3 weeks. Referral placed.  -Disposition plan per therapies and case management.   Neurology will be available for questions. Plan discussed with and approved by Dr. .   , MSN, APN-BC Neurology Nurse Practitioner Pager [phone]

## 2021-06-21 MED ORDER — PANTOPRAZOLE SODIUM 40 MG PO TBEC
40.0000 mg | DELAYED_RELEASE_TABLET | Freq: Every day | ORAL | Status: DC
  Administered 2021-06-21 – 2021-06-23 (×3): 40 mg via ORAL
  Filled 2021-06-21 (×3): qty 1

## 2021-06-21 NOTE — PMR Pre-admission (Signed)
PMR Admission Coordinator Pre-Admission Assessment  Patient: Caroline [name] is an 85 y.o., female MRN: [id number] DOB: 1935/11/04 Height: 5' 4" (162.6 cm) Weight: 63.1 kg  Insurance Information HMO: yes    PPO:      PCP:      IPA:      80/20:      OTHER:  PRIMARY: [employer] Medicare      Policy#: [id number]      Subscriber: patient CM Name:       Phone#: [phone]     Fax#: [phone] Pre-Cert#: [id number]      Employer:  Sharyn Lull with [employer] called with approval for admission 9/20-2/27 with updates due 06/30/21. Benefits:  Phone #: [email or web address]     Name:  Eff. Date: 10/05/19-present     Deduct: $0 (does not have deductible)       Out of Pocket Max: $4,500 [phone] met)   Life Max: NA CIR: $325/day co-pay days 1-5, 100% coverage days 6+      SNF: 100% coverage days 1-20, $188/day co-pay days 21-44, 100% coverage days 45-100; limited to 100 days/cal year Outpatient: $30/visit co-pay     Co-Pay:  Home Health: 100% coverage      Co-Pay:  DME: 80% coverage     Co-Pay: 20% co-insurance Providers: in-network SECONDARY:       Policy#:      Phone#:   Development worker, community:       Phone#:   The Engineer, petroleum" for patients in Inpatient Rehabilitation Facilities with attached "Privacy Act [place] Records" was provided and verbally reviewed with: Patient  Emergency Contact Information Contact Information     Name Relation Home Work Mobile   Hutmacher,[name] Spouse [phone]  [phone]   Laiana, Fratus Daughter   [phone]       Current Medical History  Patient Admitting Diagnosis: Left PCA stroke History of Present Illness: Pt is a 85 year old female with medical hx significant for: DVT, GERD, HTN, CVA, scoliosis. Pt has been followed for symptomatic paraesophageal hernia by Dr. Loletha Carrow. She has been encouraged to pursue surgical repair of her hiatal hernia for over a year but has been reluctant to do so given her age. On 9/12, pt presented to  hospital for robotic paraesophageal hernia repair with gastropexy.Complete NIHSS TOTAL: 0After reported difficulty with ADLs, CT head, on 9/15, revealed large acute PCA territory infarct as well as infarcts in the left thalamus, and left cerebellar hemisphere. MRI brain, MRA head and neck showed large acute left PCA territory infarct without hemorrhage or mass effect. Echo showed no source for embolic stroke. Therapy evaluations completed and CIR recommended d/t pt's deficits in functional mobility and ability to complete ADLs independently.   Patient's medical record from [facility] has been reviewed by the rehabilitation admission coordinator and physician.  Past Medical History  Past Medical History:  Diagnosis Date   Allergy    Anxiety    Arthritis    Cataract    Celiac sprue    CVA (cerebral vascular accident) ([place]) 02/2018   R thalamic CVA   DVT (deep vein thrombosis) in pregnancy    GERD (gastroesophageal reflux disease)    Glaucoma    Hormone replacement therapy (HRT)    Hypercholesteremia    Hyperlipidemia    Hypertension    Hypoglycemia    Hypothyroidism    OAB (overactive bladder)    Osteoporosis    PONV (postoperative nausea and vomiting)    Raynaud's disease    Scoliosis  Seasonal allergies    Senile purpura ([place])    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 Yes  Family History   family history includes Arthritis in her mother; Cancer in her maternal uncle; Hypertension in her mother; Obesity in her sister.  Current Medications  Current  Facility-Administered Medications:    acetaminophen (TYLENOL) tablet 650 mg, 650 mg, Oral, Q6H PRN, Romana Juniper A, MD, 650 mg at 06/23/21 1052   aspirin EC tablet 81 mg, 81 mg, Oral, Daily, Bhagat, Srishti L, MD, 81 mg at 06/23/21 0853   atorvastatin (LIPITOR) tablet 80 mg, 80 mg, Oral, QHS, Bhagat, Srishti L, MD, 80 mg at 06/23/21 2131   bisacodyl (DULCOLAX) suppository 10 mg, 10 mg, Rectal, Daily PRN, Romana Juniper A, MD   cefTRIAXone (ROCEPHIN) 1 g in sodium chloride 0.9 % 100 mL IVPB, 1 g, Intravenous, Q24H, Adhikari, Amrit, MD, Stopped at 06/23/21 1557   clopidogrel (PLAVIX) tablet 75 mg, 75 mg, Oral, Daily, Romana Juniper A, MD, 75 mg at 06/23/21 0853   docusate sodium (COLACE) capsule 100 mg, 100 mg, Oral, BID, Kae Heller, Chelsea A, MD, 100 mg at 06/23/21 2131   enoxaparin (LOVENOX) injection 40 mg, 40 mg, Subcutaneous, Q24H, Kae Heller, Chelsea A, MD, 40 mg at 06/23/21 0844   feeding supplement (BOOST / RESOURCE BREEZE) liquid 1 Container, 1 Container, Oral, TID BM, Clovis Riley, MD, 1 Container at 06/23/21 2000   hydrALAZINE (APRESOLINE) injection 10 mg, 10 mg, Intravenous, Q2H PRN, Romana Juniper A, MD, 10 mg at 06/19/21 2327   hydrocortisone cream 1 %, , Topical, BID, Clovis Riley, MD, Given at 06/23/21 2131   irbesartan (AVAPRO) tablet 150 mg, 150 mg, Oral, Daily, Romana Juniper A, MD, 150 mg at 06/23/21 0960   levothyroxine (SYNTHROID) tablet 125 mcg, 125 mcg, Oral, Q0600, Gardiner Barefoot, NP, 125 mcg at 06/24/21 0538   metoprolol tartrate (LOPRESSOR) injection 5 mg, 5 mg, Intravenous, Q6H PRN, Clovis Riley, MD   nystatin (MYCOSTATIN/NYSTOP) topical powder, , Topical, BID, Romana Juniper A, MD, Given at 06/23/21 2131   ondansetron (ZOFRAN-ODT) disintegrating tablet 4 mg, 4 mg, Oral, Q6H PRN **OR** ondansetron (ZOFRAN) injection 4 mg, 4 mg, Intravenous, Q6H PRN, Kae Heller, Chelsea A, MD   pantoprazole (PROTONIX) EC tablet 40 mg, 40 mg, Oral, QHS, Connor, Chelsea A,  MD, 40 mg at 06/23/21 2131   polyvinyl alcohol (LIQUIFILM TEARS) 1.4 % ophthalmic solution 1 drop, 1 drop, Right Eye, PRN, Romana Juniper A, MD, 1 drop at 06/23/21 1053   QUEtiapine (SEROQUEL) tablet 25 mg, 25 mg, Oral, QHS, Connor, Chelsea A, MD, 25 mg at 06/23/21 2131   vitamin B-12 (CYANOCOBALAMIN) tablet 250 mcg, 250 mcg, Oral, Daily, Kirby-Graham, Karsten Fells, NP, 250 mcg at 06/23/21 4540  Patients Current Diet:  Diet Order             DIET - DYS 1 Room service appropriate? Yes; Fluid consistency: Thin  Diet effective now  Precautions / Restrictions Precautions Precautions: Fall Precaution Comments: visual neglect R side Restrictions Weight Bearing Restrictions: No   Has the patient had 2 or more falls or a fall with injury in the past year? No  Prior Activity Level Community (5-7x/wk): driving,working  Prior Functional Level Self Care: Did the patient need help bathing, dressing, using the toilet or eating? Independent  Indoor Mobility: Did the patient need assistance with walking from room to room (with or without device)? Independent  Stairs: Did the patient need assistance with internal or external stairs (with or without device)? Independent  Functional Cognition: Did the patient need help planning regular tasks such as shopping or remembering to take medications? Independent  Patient Information Are you of Hispanic, Latino/a,or Spanish origin?: A. No, not of Hispanic, Latino/a, or Spanish origin What is your race?: A. White Do you need or want an interpreter to communicate with a doctor or health care staff?: 0. No  Patient's Response To:  Health Literacy and Transportation Is the patient able to respond to health literacy and transportation needs?: Yes Health Literacy - How often do you need to have someone help you when you read instructions, pamphlets, or other written material from your doctor or pharmacy?: Never In the past 12 months, has  lack of transportation kept you from medical appointments or from getting medications?: No In the past 12 months, has lack of transportation kept you from meetings, work, or from getting things needed for daily living?: No  Home Assistive Devices / [place] Devices/Equipment: Eyeglasses, Blood pressure cuff, Cane (specify quad or straight), Walker (specify type), Grab bars in shower Home Equipment: Walker - 2 wheels, Cane - single point  Prior Device Use: Indicate devices/aids used by the patient prior to current illness, exacerbation or injury? Walker and cane  Current Functional Level Cognition  Arousal/Alertness: Awake/alert Overall Cognitive Status: Impaired/Different from baseline Current Attention Level: Sustained Orientation Level: Oriented to person Following Commands: Follows one step commands with increased time Safety/Judgement: Decreased awareness of safety, Decreased awareness of deficits General Comments: neighbor in to visit, repeated several times that her neighbor was visiting and if I had met her. Attention: Focused, Sustained Focused Attention: Appears intact Sustained Attention: Appears intact Memory: Impaired Memory Impairment: Storage deficit, Retrieval deficit, Decreased long term memory, Decreased short term memory Decreased Long Term Memory: Verbal basic Decreased Short Term Memory: Verbal basic Awareness: Impaired Awareness Impairment: Intellectual impairment, Emergent impairment Problem Solving: Impaired Problem Solving Impairment: Verbal basic, Functional basic Safety/Judgment: Impaired    Extremity Assessment (includes Sensation/Coordination)  Upper Extremity Assessment: Generalized weakness  Lower Extremity Assessment: Defer to PT evaluation    ADLs  Overall ADL's : Needs assistance/impaired Grooming: Wash/dry hands, Minimal assistance, Standing Grooming Details (indicate cue type and reason): to reach for soap dispenser with R hand  due to difficulty with visual scanning Upper Body Bathing: Supervision/ safety, Sitting Upper Body Bathing Details (indicate cue type and reason): ' Lower Body Bathing: Moderate assistance, Sitting/lateral leans, Sit to/from stand Upper Body Dressing : Supervision/safety, Sitting Lower Body Dressing: Min guard, Sit to/from stand Lower Body Dressing Details (indicate cue type and reason): patient needing increased time to doff soiled briefs and don clean pair due to visual deficits, min G in standing for safety Toilet Transfer: Minimal assistance, Cueing for safety, Cueing for sequencing, Ambulation, Regular Toilet, RW, Grab bars Toilet Transfer Details (indicate cue type and reason): needs cues for proper hand placement especially on R side to hold proper place on walker  frame due to difficulty visually scanning needing multimodal cues. light min A to power up to standing from toilet. Toileting- Water quality scientist and Hygiene: Min guard, Sit to/from stand, Sitting/lateral lean Toileting - Clothing Manipulation Details (indicate cue type and reason): patient able to perform peri care after voiding in standing, min G for safety Functional mobility during ADLs: Minimal assistance, Cueing for safety, Cueing for sequencing, Rolling walker General ADL Comments: continues to have difficulty safely navigating her environment with walker due to visual deficits needing multimodal cues for scanning    Mobility  Overal bed mobility: Needs Assistance Bed Mobility: Supine to Sit Supine to sit: Supervision Sit to supine: Mod assist General bed mobility comments: extra time, cues to initiate activity and cues  to complete, pt asks  multiplee times what she should be doing.    Transfers  Overall transfer level: Needs assistance Equipment used: Rolling walker (2 wheeled) Transfers: Sit to/from Stand Sit to Stand: Min assist Stand pivot transfers: Min guard General transfer comment: light min A to power  up to standing from bed and  toilet. cues to use rail. Posture appears more flexed today. noted significant scoliosis    Ambulation / Gait / Stairs / Wheelchair Mobility  Ambulation/Gait Ambulation/Gait assistance: Herbalist (Feet): 120 Feet Assistive device: Rolling walker (2 wheeled) Gait Pattern/deviations: Step-to pattern, Trunk flexed General Gait Details: constant cues for right visual deficits, assist with turning RW, keeping in straight direction. Frequent cues to catch up to RW and stand more erect, look ahead. Patient required mutiple cues to find chair on her right. Gait velocity: decreased    Posture / Balance Dynamic Sitting Balance Sitting balance - Comments: improved, able to cross over each leg, did not lean Balance Overall balance assessment: Needs assistance Sitting-balance support: Feet supported Sitting balance-Leahy Scale: Good Sitting balance - Comments: improved, able to cross over each leg, did not lean Standing balance support: Single extremity supported, During functional activity Standing balance-Leahy Scale: Fair Standing balance comment: standing from toilet to  wipe post BM. and wash hands at sink    Special needs/care consideration Skin Ecchymosis: abdomen/left; Surgical incision: abdomen, 6 Abdominal ports, Bladder incontinence   Previous Home Environment (from acute therapy documentation) Living Arrangements: Spouse/significant other  Lives With: Spouse Available Help at Discharge: Family, Available PRN/intermittently Type of Home: House Home Layout: Two level, Able to live on main level with bedroom/bathroom Home Access: Stairs to enter Entrance Stairs-Rails: None Entrance Stairs-Number of Steps: 3 steps in front; 1 step in garage Bathroom Shower/Tub: Multimedia programmer: Standard Bathroom Accessibility: Yes How Accessible: Accessible via walker [place]: No  Discharge Living Setting Plans for Discharge  Living Setting: Patient's home Type of Home at Discharge: House Discharge Home Layout: Two level, Able to live on main level with bedroom/bathroom Discharge Home Access: Stairs to enter Entrance Stairs-Rails: None Entrance Stairs-Number of Steps: 3 steps in front; 1 step in garage Discharge Bathroom Shower/Tub: Walk-in shower Discharge Bathroom Toilet: Standard Discharge Bathroom Accessibility: Yes How Accessible: Accessible via walker Does the patient have any problems obtaining your medications?: No  Social/Family/Support Systems Patient Roles: Spouse Anticipated Caregiver: Nocole Zammit, husband; daughters helping when in town; family plans to hire assistance Anticipated Caregiver's Contact Information: [name]: [phone] Caregiver Availability: Intermittent Discharge Plan Discussed with Primary Caregiver: Yes Is Caregiver In Agreement with Plan?: Yes Does Caregiver/Family have Issues with Lodging/Transportation while Pt is in Rehab?: No  Goals Patient/Family Goal for Rehab: PT/OT Supervision, SLP Min A Expected  length of stay: 8-10 days Pt/Family Agrees to Admission and willing to participate: Yes Program Orientation Provided & Reviewed with Pt/Caregiver Including Roles  & Responsibilities: Yes  Decrease burden of Care through IP rehab admission: NA  Possible need for SNF placement upon discharge: Not anticipated  Patient Condition: I have reviewed medical records from [facility], spoken with CM, and family member. I discussed via phone for inpatient rehabilitation assessment.  Patient will benefit from ongoing PT, OT, and SLP, can actively participate in 3 hours of therapy a day 5 days of the week, and can make measurable gains during the admission.  Patient will also benefit from the coordinated team approach during an Inpatient Acute Rehabilitation admission.  The patient will receive intensive therapy as well as Rehabilitation physician, nursing, social  worker, and care management interventions.  Due to safety, skin/wound care, disease management, medication administration, pain management, and patient education the patient requires 24 hour a day rehabilitation nursing.  The patient is currently min A with mobility and basic ADLs.  Discharge setting and therapy post discharge at home with home health is anticipated.  Patient has agreed to participate in the Acute Inpatient Rehabilitation Program and will admit today.  Preadmission Screen Completed By:  Bethel Born, 06/24/2021 8:17 AM ______________________________________________________________________   Discussed status with Dr. Posey Pronto on 06/24/21 at 4  and received approval for admission today.  Admission Coordinator:  Bethel Born, [place] with updates by Clemens Catholic, time 1130/Date 06/24/21   Assessment/Plan: Diagnosis: Left PCA stroke Does the need for close, 24 hr/day Medical supervision in concert with the patient's rehab needs make it unreasonable for this patient to be served in a less intensive setting? Yes Co-Morbidities requiring supervision/potential complications: DVT, GERD, HTN, CVA, scoliosis Due to bladder management, safety, disease management, and patient education, does the patient require 24 hr/day rehab nursing? Yes Does the patient require coordinated care of a physician, rehab nurse, PT, OT, and SLP to address physical and functional deficits in the context of the above medical diagnosis(es)? Yes Addressing deficits in the following areas: balance, endurance, locomotion, strength, transferring, toileting, cognition, and psychosocial support Can the patient actively participate in an intensive therapy program of at least 3 hrs of therapy 5 days a week? Yes The potential for patient to make measurable gains while on inpatient rehab is excellent and good Anticipated functional outcomes upon discharge from inpatient rehab: supervision PT, supervision OT, min  assist SLP Estimated rehab length of stay to reach the above functional goals is: 7-10 days. Anticipated discharge destination: Home 10. Overall Rehab/Functional Prognosis: good   MD Signature: Delice Lesch, MD, ABPMR

## 2021-06-21 NOTE — Progress Notes (Signed)
S: No acute events overnight. Feels well this morning.   O: Vitals, labs, intake/output, and orders reviewed at this time.    Alert, pleasant, answers questions  Abdomen soft, nontender. Incisions c/d/i  Lines/tubes/drains: PIV  A/P: POD 6 s/p robotic paraesophageal hernia with gastropexy -Surgery was uneventful, postop upper GI as expected -Continue pured diet -Continue delirium precautions (lights on, TV on, blinds open, keep awake and engaged throughout the day; at night from 10pm-6am keep lights off, TV off, blinds shut and minimize sleep interruptions) -CT 9/15 demonstrated new PCA infarct- Neurology consulted and recs completed. Will await further recs, now that Echo is complete -PT/OT/SLP, dispo planning. CIR consult placed, waiting for evaluation From surgical standpoint she can be discharged when deemed suitable by neurology and disposition is arranged.     Vanita Panda, MD  Colorectal and General Surgery [facility]

## 2021-06-21 NOTE — Progress Notes (Signed)
Inpatient Rehab Admissions:  Inpatient Rehab Consult received.  I spoke with patient on the telephone for rehabilitation assessment and to discuss goals and expectations of an inpatient rehab admission.  Pt acknowledged understanding and is interested in pursuing CIR. Pt gave permission to contact daughter, Tresa Endo. Spoke with Tresa Endo on the telephone. She acknowledged understanding of goals and expectations and is supportive of pt pursuing CIR.  Will continue to follow.  Signed: Wolfgang Phoenix, MS, CCC-SLP Admissions Coordinator [phone]

## 2021-06-22 ENCOUNTER — Encounter (HOSPITAL_COMMUNITY): Payer: Self-pay | Admitting: Surgery

## 2021-06-22 ENCOUNTER — Inpatient Hospital Stay (HOSPITAL_COMMUNITY): Payer: Medicare Other

## 2021-06-22 DIAGNOSIS — F05 Delirium due to known physiological condition: Secondary | ICD-10-CM

## 2021-06-22 LAB — CBC WITH DIFFERENTIAL/PLATELET
Abs Immature Granulocytes: 0.02 10*3/uL (ref 0.00–0.07)
Basophils Absolute: 0 10*3/uL (ref 0.0–0.1)
Basophils Relative: 1 %
Eosinophils Absolute: 0.3 10*3/uL (ref 0.0–0.5)
Eosinophils Relative: 5 %
HCT: 35.6 % — ABNORMAL LOW (ref 36.0–46.0)
Hemoglobin: 11.6 g/dL — ABNORMAL LOW (ref 12.0–15.0)
Immature Granulocytes: 0 %
Lymphocytes Relative: 19 %
Lymphs Abs: 1.4 10*3/uL (ref 0.7–4.0)
MCH: 28.9 pg (ref 26.0–34.0)
MCHC: 32.6 g/dL (ref 30.0–36.0)
MCV: 88.6 fL (ref 80.0–100.0)
Monocytes Absolute: 0.7 10*3/uL (ref 0.1–1.0)
Monocytes Relative: 10 %
Neutro Abs: 4.7 10*3/uL (ref 1.7–7.7)
Neutrophils Relative %: 65 %
Platelets: 298 10*3/uL (ref 150–400)
RBC: 4.02 MIL/uL (ref 3.87–5.11)
RDW: 16.4 % — ABNORMAL HIGH (ref 11.5–15.5)
WBC: 7.1 10*3/uL (ref 4.0–10.5)
nRBC: 0 % (ref 0.0–0.2)

## 2021-06-22 LAB — T4, FREE: Free T4: 0.99 ng/dL (ref 0.61–1.12)

## 2021-06-22 LAB — URINALYSIS, ROUTINE W REFLEX MICROSCOPIC
Bilirubin Urine: NEGATIVE
Glucose, UA: NEGATIVE mg/dL
Ketones, ur: NEGATIVE mg/dL
Nitrite: POSITIVE — AB
Protein, ur: NEGATIVE mg/dL
Specific Gravity, Urine: 1.02 (ref 1.005–1.030)
pH: 6 (ref 5.0–8.0)

## 2021-06-22 LAB — VITAMIN B12: Vitamin B-12: 526 pg/mL (ref 180–914)

## 2021-06-22 LAB — TSH: TSH: 8.036 u[IU]/mL — ABNORMAL HIGH (ref 0.350–4.500)

## 2021-06-22 MED ORDER — NYSTATIN 100000 UNIT/GM EX POWD
Freq: Two times a day (BID) | CUTANEOUS | Status: DC
  Filled 2021-06-22: qty 15

## 2021-06-22 MED ORDER — LEVOTHYROXINE SODIUM 125 MCG PO TABS
125.0000 ug | ORAL_TABLET | Freq: Every day | ORAL | Status: DC
  Administered 2021-06-23 – 2021-06-24 (×2): 125 ug via ORAL
  Filled 2021-06-22 (×2): qty 1

## 2021-06-22 MED ORDER — CYANOCOBALAMIN 500 MCG PO TABS
250.0000 ug | ORAL_TABLET | Freq: Every day | ORAL | Status: DC
  Administered 2021-06-23 – 2021-06-24 (×2): 250 ug via ORAL
  Filled 2021-06-22 (×2): qty 1

## 2021-06-22 MED ORDER — HYDROCORTISONE 1 % EX CREA
TOPICAL_CREAM | Freq: Two times a day (BID) | CUTANEOUS | Status: DC
  Filled 2021-06-22: qty 28

## 2021-06-22 MED ORDER — CIPROFLOXACIN IN D5W 200 MG/100ML IV SOLN
200.0000 mg | Freq: Two times a day (BID) | INTRAVENOUS | Status: DC
  Administered 2021-06-22 – 2021-06-23 (×2): 200 mg via INTRAVENOUS
  Filled 2021-06-22 (×2): qty 100

## 2021-06-22 MED ORDER — CYANOCOBALAMIN 1000 MCG/ML IJ SOLN
1000.0000 ug | Freq: Once | INTRAMUSCULAR | Status: AC
  Administered 2021-06-22: 1000 ug via INTRAMUSCULAR
  Filled 2021-06-22: qty 1

## 2021-06-22 NOTE — Plan of Care (Signed)
Patient ID: Caroline [name], female   DOB: 1936/09/28, 85 y.o.   MRN: [id number]    Problem: Education: Goal: Knowledge of General Education information will improve Description: Including pain rating scale, medication(s)/side effects and non-pharmacologic comfort measures Outcome: Progressing   Problem: Health Behavior/Discharge Planning: Goal: Ability to manage health-related needs will improve Outcome: Progressing   Problem: Clinical Measurements: Goal: Ability to maintain clinical measurements within normal limits will improve Outcome: Progressing Goal: Will remain free from infection Outcome: Progressing Goal: Diagnostic test results will improve Outcome: Progressing Goal: Respiratory complications will improve Outcome: Progressing Goal: Cardiovascular complication will be avoided Outcome: Progressing   Problem: Activity: Goal: Risk for activity intolerance will decrease Outcome: Progressing   Problem: Nutrition: Goal: Adequate nutrition will be maintained Outcome: Progressing   Problem: Coping: Goal: Level of anxiety will decrease Outcome: Progressing   Problem: Elimination: Goal: Will not experience complications related to bowel motility Outcome: Progressing Goal: Will not experience complications related to urinary retention Outcome: Progressing   Problem: Pain Managment: Goal: General experience of comfort will improve Outcome: Progressing   Problem: Safety: Goal: Ability to remain free from injury will improve Outcome: Progressing   Problem: Skin Integrity: Goal: Risk for impaired skin integrity will decrease Outcome: Progressing    BP (!) 117/51 (BP Location: Left Arm)   Pulse 72   Temp (!) 97.5 F (36.4 C) (Oral)   Resp 18   Ht 5\' 4"  (1.626 m)   Wt 63.1 kg   SpO2 95%   BMI 23.88 kg/m     Intake/Output Summary (Last 24 hours) at 06/22/2021 1139 Last data filed at 06/22/2021 0500 Gross per 24 hour  Intake 120 ml  Output 1500 ml  Net -1380 ml      06/24/2021, RN

## 2021-06-22 NOTE — Progress Notes (Signed)
Physical Therapy Treatment Patient Details Name: Caroline [name] MRN: [id number] DOB: [birth year] Today's Date: 06/22/2021   History of Present Illness Pt is an 85 year old female s/p status post robotic repair of paraesophageal hernia with gastropexy on 06/15/21 by Dr. Fredricka Bonine.  Pt with post operative lethargy and confusion;        MRI =  Large acute left PCA territory infarct without hemorrhage or mass  effect.  also per MD notes  Suspect that she has some low-grade underlying dementia which has been exacerbated by undergoing general anesthesia.  PMHx: CVA, scoliosis, DVT, GERD, HTN    PT Comments    The patient just returned from CT. Patient assisted with min assist in standing , able to take a few steps with 1 HHA. Patient sat in recliner to perform coordination exercises for both LE's.  Patient ambulated  x 125' using RW. Frequent cues for right vision deficits. Assist with turning and finding recliner on her right side when entered room.  Patient will benefit from CIR.  Recommendations for follow up therapy are one component of a multi-disciplinary discharge planning process, led by the attending physician.  Recommendations may be updated based on patient status, additional functional criteria and insurance authorization.  Follow Up Recommendations  CIR     Equipment Recommendations  None recommended by PT    Recommendations for Other Services       Precautions / Restrictions Precautions Precautions: Fall Precaution Comments: visual neglect R side     Mobility  Bed Mobility               General bed mobility comments: OOB in recliner    Transfers Overall transfer level: Needs assistance Equipment used: Rolling walker (2 wheeled) Transfers: Sit to/from Stand Sit to Stand: Min assist;Min guard         General transfer comment: min from bulky Wc, min guard from recliner, cues for hand placement did not reach back with cues  Ambulation/Gait Ambulation/Gait assistance:  Min assist Gait Distance (Feet): 120 Feet Assistive device: Rolling walker (2 wheeled) Gait Pattern/deviations: Step-through pattern Gait velocity: decreased   General Gait Details: constant cues for right visual deficits, assist with turning RW   Stairs             Wheelchair Mobility    Modified Rankin (Stroke Patients Only)       Balance Overall balance assessment: Needs assistance Sitting-balance support: Feet supported Sitting balance-Leahy Scale: Fair Sitting balance - Comments: improved, able to cross over each leg, did not lean   Standing balance support: Bilateral upper extremity supported;Single extremity supported Standing balance-Leahy Scale: Fair Standing balance comment: is abel to briefly stand without UE support however reliant on at least unilateral UE support for dynamic tasks, let go of Rw to move from [facility]  to Recliner with min assist.                            Cognition Arousal/Alertness: Awake/alert Behavior During Therapy: WFL for tasks assessed/performed Overall Cognitive Status: Impaired/Different from baseline Area of Impairment: Orientation;Attention;Memory;Safety/judgement;Awareness                 Orientation Level: Place;Time;Situation Current Attention Level: Sustained Memory: Decreased short-term memory Following Commands: Follows one step commands with increased time;Follows multi-step commands inconsistently Safety/Judgement: Decreased awareness of safety;Decreased awareness of deficits Awareness: Emergent Problem Solving: Difficulty sequencing;Requires verbal cues;Requires tactile cues        Exercises General Exercises -  Lower Extremity Ankle Circles/Pumps: AROM;Both;10 reps;Seated Long Arc Quad: AROM;Both;10 reps;Seated (reciprocal) Heel Raises: Seated;Both;10 reps (reciprocal)    General Comments        Pertinent Vitals/Pain Pain Assessment: No/denies pain    Home Living                       Prior Function            PT Goals (current goals can now be found in the care plan section) Progress towards PT goals: Progressing toward goals    Frequency    Min 3X/week      PT Plan Current plan remains appropriate    Co-evaluation              AM-PAC PT "6 Clicks" Mobility   Outcome Measure  Help needed turning from your back to your side while in a flat bed without using bedrails?: A Little Help needed moving from lying on your back to sitting on the side of a flat bed without using bedrails?: A Little Help needed moving to and from a bed to a chair (including a wheelchair)?: A Little Help needed standing up from a chair using your arms (e.g., wheelchair or bedside chair)?: A Little Help needed to walk in hospital room?: A Little Help needed climbing 3-5 steps with a railing? : A Little 6 Click Score: 18    End of Session Equipment Utilized During Treatment: Gait belt Activity Tolerance: Patient tolerated treatment well Patient left: in chair;with call bell/phone within reach;with chair alarm set;with family/visitor present Nurse Communication: Mobility status PT Visit Diagnosis: Difficulty in walking, not elsewhere classified (R26.2);Unsteadiness on feet (R26.81)     Time: 1050-1140 PT Time Calculation (min) (ACUTE ONLY): 50 min  Charges:  $Gait Training: 23-37 mins $Neuromuscular Re-education: 8-22 mins                     Blanchard Kelch PT Acute Rehabilitation Services Pager [phone] Office [phone]    Rada Hay 06/22/2021, 12:26 PM

## 2021-06-22 NOTE — Progress Notes (Signed)
Inpatient Rehab Admissions Coordinator:  Spoke with pt's daughter, [name] on the telephone. Informed her that insurance authorization has started. Will continue to follow.   Wolfgang Phoenix, MS, CCC-SLP Admissions Coordinator [phone]

## 2021-06-22 NOTE — Progress Notes (Signed)
S: Slept well but quite fatigued this morning. Family at bedside reports that she was not mobilized AT ALL this weekend other than getting out of bed to the bathroom. Despite orders in place to ambulate qshift. She is tolerating PO without issue. C/o itching at incisions and dampness/irritation under breasts.   O: Vitals, labs, intake/output, and orders reviewed at this time.    Alert, pleasant, answers questions  Abdomen soft, nontender. Incisions c/d/i  Lines/tubes/drains: PIV  A/P: POD 7 s/p robotic paraesophageal hernia with gastropexy -Surgery was uneventful, postop upper GI as expected -Continue pured diet -Continue delirium precautions (lights on, TV on, blinds open, keep awake and engaged throughout the day; at night from 10pm-6am keep lights off, TV off, blinds shut and minimize sleep interruptions) -CT 9/15 demonstrated new PCA infarct- Neurology consulted and recs completed.  -Family noting increased fatigue this AM. Will go ahead and repeat CT and have sent an Epic Secure chat to neurology requesting re-evaluation today -PT/OT/SLP, dispo planning. CIR consult placed and evaluation in process.  Unfortunately despite orders the patient received no therapies through the weekend and essentially did not mobilize. This is pretty unacceptable and discussed w today's RN at the bedside.   From surgical standpoint she can be discharged pending repeat CT and when disposition is arranged.     Berna Bue MD  [facility] Surgery

## 2021-06-22 NOTE — Care Management Important Message (Signed)
Important Message  Patient Details IM Letter given to the Patient. Name: Caroline [name] MRN: [id number] Date of Birth: 26-Sep-1936   Medicare Important Message Given:  Yes     Caren Macadam 06/22/2021, 11:46 AM

## 2021-06-22 NOTE — Consult Note (Addendum)
[facility] Medical Consultation  Caroline [name] UKG:[id number] DOB: [birth year] DOA: 06/15/2021 PCP: Gaspar Garbe, MD   Requesting physician: Dr Fredricka Bonine Date of consultation: 06/22/2021 Reason for consultation: Altered mental status   HPI:  This is an 85 year old female with history of prior CVA, mild cognitive impairment, symptomatic paraesophageal hernia followed by GI and general surgery, GERD, hypertension, hyperlipidemia who presents to the hospital and is admitted on 9/12 on general surgery service for paraesophageal hernia repair.  She underwent surgery on 9/12 with robotic paraesophageal hernia repair, gastropexy and upper endoscopy.  Following the surgery few days later she developed confusion and worsening of the mental status, and brain imaging revealed an acute large left PCA territory infarct without hemorrhage or mass defect.  Neurology was consulted and she completed the work-up, recommending aspirin and Plavix for 21 days followed by Plavix alone.  Patient has had persistent confusion and increased sleepiness today and the hospitalist team was consulted.  Today patient is confused, is disoriented to time, place but has some insight into situation.  She denies any chest pain, denies any shortness of breath, no abdominal pain, no nausea or vomiting.  Review of Systems:  Unable to obtain full and accurate review of systems due to confusion  Impression/Recommendations  Principal problem Acute metabolic encephalopathy, possibly component of in-hospital delirium with underlying mild cognitive issues-patient has been seen by neurology as an outpatient in the past with some memory loss and mild cognitive impairment however she is highly functional and was still driving up to this hospital stay.  Suspect her worsening mental status is probably related to hospitalization/critical illness -Urinalysis did show evidence of a UTI with moderate leukocytes, positive nitrates and many  bacteria.  She lacks specific symptoms however she is more confused than her baseline and would favor to treat until cultures come back -TSH is elevated 8.0 which may also be due to acute illness but will increase Synthroid just marginally and reassess a TSH in 3 weeks  Active problems Acute CVA-work-up completed by neurology, continue aspirin, Plavix, statin as per original plan Essential hypertension-continue home medications Hyperlipidemia-continue statin Possible UTI-see discussion above Hypothyroidism-see discussion above  Scheduled Meds:  aspirin EC  81 mg Oral Daily   atorvastatin  80 mg Oral QHS   clopidogrel  75 mg Oral Daily   cyanocobalamin  1,000 mcg Intramuscular Once   docusate sodium  100 mg Oral BID   enoxaparin (LOVENOX) injection  40 mg Subcutaneous Q24H   feeding supplement  1 Container Oral TID BM   hydrocortisone cream   Topical BID   irbesartan  150 mg Oral Daily   [START ON 06/23/2021] levothyroxine  125 mcg Oral Q0600   nystatin   Topical BID   pantoprazole  40 mg Oral QHS   QUEtiapine  25 mg Oral QHS   [START ON 06/23/2021] vitamin B-12  250 mcg Oral Daily   Continuous Infusions:  ciprofloxacin     PRN Meds:.acetaminophen, bisacodyl, hydrALAZINE, metoprolol tartrate, ondansetron **OR** ondansetron (ZOFRAN) IV, polyvinyl alcohol  Our hospitalist team will followup again tomorrow. Please contact me if I can be of assistance in the meanwhile. Thank you for this consultation.   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 Cheek   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   Bridge Left Nose, and Left Neck   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 Cheek Tx: curret x 3 and 5FU   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 She has never used smokeless tobacco. She reports that she does not currently use drugs. She reports that she does not drink alcohol.  Allergies  Allergen Reactions   Gluten Meal Nausea And Vomiting    Patient has CELIAC DISEASE   Wheat Bran Nausea And Vomiting    Patient has CELIAC DISEASE   Adhesive [Tape] Other (See Comments)    Patient's skin is VERY THIN and tears very easily; please use either paper tape or Coban wrap   Codeine Nausea And Vomiting   Other Other (See Comments)    Patient has Hypoglycemia; her husband stated "when her sugar crashes, it crashes hard."   Penicillins Other (See Comments)    Doesn't remember reaction , but denies any SOB/Swelling States " it was a long time ago "    Family History  Problem Relation Age of Onset   Arthritis Mother        pt denies   Hypertension Mother        pt denies   Obesity Sister    Cancer Maternal Uncle        type unknown   Colon cancer Neg Hx    Stomach cancer Neg Hx    Esophageal cancer  Neg Hx    Pancreatic cancer Neg Hx     Prior to Admission medications   Medication Sig Start Date End Date Taking? Authorizing Provider  acetaminophen (TYLENOL) 650 MG CR tablet Take 1,300 mg by mouth every 8 (eight) hours as needed for pain.   Yes [provider]  Ascorbic Acid (VITAMIN C) 100 MG tablet Take 100 mg by mouth every other day.    Yes [provider]  atorvastatin (LIPITOR) 40 MG tablet TAKE 1 TABLET (40 MG TOTAL) BY MOUTH DAILY. 04/14/21  Yes Micki Riley, MD  Cholecalciferol (VITAMIN D-3) 1000 units CAPS Take 1,000 Units by mouth daily.   Yes [provider]  Coenzyme Q10 (COQ10 PO) Take 1 tablet by mouth daily.   Yes [provider]  hydrochlorothiazide (MICROZIDE) 12.5 MG capsule Take 12.5 mg by mouth daily as needed (fluid).   Yes [provider]  irbesartan (AVAPRO) 150 MG tablet Take 150 mg by mouth daily.  10/11/17  Yes [provider]  levothyroxine (SYNTHROID) 112 MCG tablet Take 112 mcg by mouth daily. 05/19/21  Yes [provider]  Omega-3 Fatty Acids (FISH OIL PO) Take 1,000 mg by mouth daily.   Yes [provider]  ondansetron (ZOFRAN-ODT) 4 MG disintegrating tablet Take 4 mg by mouth every 8 (eight) hours as needed for nausea or vomiting. 02/18/21  Yes [provider]  pantoprazole (PROTONIX) 40 MG tablet TAKE 1 TABLET BY MOUTH 2 TIMES DAILY BEFORE A MEAL. Patient taking differently: Take 40 mg by mouth 2 (two) times daily before a meal. 02/24/21  Yes Danis, Starr Lake III, MD  Potassium 99 MG TABS Take 99 mg by mouth daily.   Yes [provider]  simethicone (MYLICON) 125 MG chewable tablet Chew 125 mg by mouth every 6 (six) hours as needed for flatulence.   Yes [provider]  aspirin EC 81 MG EC tablet Take 1 tablet (81 mg total) by mouth daily for 21 days. Swallow whole. 06/19/21 07/10/21  Berna Bue, MD  atorvastatin (LIPITOR) 80 MG tablet Take 1 tablet (80 mg  total) by mouth at bedtime. 06/20/21 09/18/21  Berna Bue, MD  clopidogrel (PLAVIX) 75 MG tablet Take 75 mg by mouth daily. Patient not taking: No sig reported    [provider]  QUEtiapine (SEROQUEL) 25 MG tablet Take 1 tablet (25 mg total) by mouth at bedtime for 5 days. 06/18/21 06/23/21  Berna Bue, MD  traMADol (ULTRAM) 50 MG tablet Take 25 mg by mouth every 6 (six) hours as needed for moderate pain. Patient not taking: No sig reported    [provider]   Physical Exam: Blood pressure (!) 129/47, pulse 87, temperature 97.7 F (36.5 C), temperature source Oral, resp. rate 18, height 5\' 4"  (1.626 m), weight 63.1 kg, SpO2 96 %. Vitals:   06/22/21 0550 06/22/21 1319  BP: (!) 117/51 (!) 129/47  Pulse: 72 87  Resp: 18   Temp: (!) 97.5 F (36.4 C) 97.7 F (36.5 C)  SpO2: 95% 96%    General: She is in no distress Eyes: PERRLA, right temporal hemianopia ENT: NCAT Neck: supple Cardiovascular: Regular rate and rhythm, no murmurs appreciated.  No peripheral edema Respiratory: Clear bilaterally, no wheezing Abdomen: Soft, nontender, nondistended, bowel sounds positive Skin: No rashes appreciated Musculoskeletal: Normal bulk and tone Psychiatric: Normal mood and affect Neurologic: Strength equal in 4 extremities, sensation intact  Labs on Admission:  Basic Metabolic Panel: Recent Labs  Lab 06/16/21 0353  NA 134*  K 3.8  CL 103  CO2 25  GLUCOSE 106*  BUN 14  CREATININE 0.60  CALCIUM 8.8*  MG 2.2   Liver Function Tests: No results for input(s): AST, ALT, ALKPHOS, BILITOT, PROT, ALBUMIN in the last 168 hours. No results for input(s): LIPASE, AMYLASE in the last 168 hours. No results for input(s): AMMONIA in the last 168 hours. CBC: Recent Labs  Lab 06/16/21 0353 06/22/21 1018  WBC 11.8* 7.1  NEUTROABS  --  4.7  HGB 11.0* 11.6*  HCT 33.8* 35.6*  MCV 86.7 88.6  PLT 274 298   Cardiac Enzymes: No results for input(s): CKTOTAL, CKMB,  CKMBINDEX, TROPONINI in the last 168 hours. BNP: Invalid input(s): POCBNP CBG: No results for input(s): GLUCAP in the last 168 hours.  Radiological Exams on Admission: CT HEAD WO CONTRAST (06/24/21)  Result Date: 06/22/2021 CLINICAL DATA:  Follow-up stroke EXAM: CT HEAD WITHOUT CONTRAST TECHNIQUE: Contiguous axial images were obtained from the base of the skull through the vertex without intravenous contrast. COMPARISON:  CT and MRI studies 06/18/2021 FINDINGS: Brain: No focal abnormality affects the brainstem or cerebellum. Acute infarction in the left PCA territory affecting the posteromedial temporal lobe, occipital lobe and left thalamus as seen previously, with low-density and swelling. No hemorrhage. Old ischemic changes of the thalami, basal ganglia and cerebral hemispheric white matter. No hydrocephalus or extra-axial collection. Vascular: There is atherosclerotic calcification of the major vessels at the base of the brain. Skull: Negative Sinuses/Orbits: Clear/normal Other: None IMPRESSION: Acute infarction affecting the left PCA territory as described above and as seen previously. No new or progressive finding. No hemorrhagic transformation. Extensive small-vessel ischemic changes elsewhere affecting the thalami, basal ganglia and hemispheric white matter. Electronically Signed   By: Paulina Fusi M.D.   On: 06/22/2021 11:11    Time spent: 55 minutes  Aily Tzeng [facility]  If 7PM-7AM, please contact night-coverage [email or web address]  06/22/2021, 5:38 PM

## 2021-06-22 NOTE — Plan of Care (Signed)
Neurology  We were recalled by surgery today to see patient due to fatigue. NP reviewed chart. We signed off on patient 06/20/21 after stroke.   Today: CTH was negative for any changes since stroke. Her OD field cut is chronic.  TSH was high. Increased Synthroid.  B12 low for neuro standards. Ordered supplementation.  UA + for infection. Culture sent. Cipro started.   Communicated to surgeon who has called in medicine team to f/up for fatigue.   Jimmye Norman, MSN, APN-BC Neurology Nurse Practitioner Pager [phone]

## 2021-06-23 LAB — COMPREHENSIVE METABOLIC PANEL
ALT: 23 U/L (ref 0–44)
AST: 27 U/L (ref 15–41)
Albumin: 3.2 g/dL — ABNORMAL LOW (ref 3.5–5.0)
Alkaline Phosphatase: 80 U/L (ref 38–126)
Anion gap: 7 (ref 5–15)
BUN: 15 mg/dL (ref 8–23)
CO2: 27 mmol/L (ref 22–32)
Calcium: 8.6 mg/dL — ABNORMAL LOW (ref 8.9–10.3)
Chloride: 106 mmol/L (ref 98–111)
Creatinine, Ser: 0.62 mg/dL (ref 0.44–1.00)
GFR, Estimated: >60 mL/min (ref >60)
Glucose, Bld: 92 mg/dL (ref 70–99)
Potassium: 3.8 mmol/L (ref 3.5–5.1)
Sodium: 140 mmol/L (ref 135–145)
Total Bilirubin: 0.4 mg/dL (ref 0.3–1.2)
Total Protein: 5.9 g/dL — ABNORMAL LOW (ref 6.5–8.1)

## 2021-06-23 LAB — CBC
HCT: 35.4 % — ABNORMAL LOW (ref 36.0–46.0)
Hemoglobin: 11.4 g/dL — ABNORMAL LOW (ref 12.0–15.0)
MCH: 28.2 pg (ref 26.0–34.0)
MCHC: 32.2 g/dL (ref 30.0–36.0)
MCV: 87.6 fL (ref 80.0–100.0)
Platelets: 281 10*3/uL (ref 150–400)
RBC: 4.04 MIL/uL (ref 3.87–5.11)
RDW: 16.5 % — ABNORMAL HIGH (ref 11.5–15.5)
WBC: 6.5 10*3/uL (ref 4.0–10.5)
nRBC: 0 % (ref 0.0–0.2)

## 2021-06-23 LAB — MAGNESIUM: Magnesium: 2.2 mg/dL (ref 1.7–2.4)

## 2021-06-23 LAB — T3, FREE: T3, Free: 1.3 pg/mL — ABNORMAL LOW (ref 2.0–4.4)

## 2021-06-23 MED ORDER — SODIUM CHLORIDE 0.9 % IV SOLN
1.0000 g | INTRAVENOUS | Status: DC
  Administered 2021-06-23: 1 g via INTRAVENOUS
  Filled 2021-06-23 (×2): qty 10

## 2021-06-23 NOTE — Progress Notes (Addendum)
S: Reports she slept well last night.  Her energy level is so-so this morning.  No other complaints today.  O: Vitals, labs, intake/output, and orders reviewed at this time.  Afebrile, no tachycardia, BP 130/53, saturating well on room air.  I&O not recorded except for urine output of 550 overnight.  CBC and CMP this morning unremarkable.  CT head yesterday was stable.  UA positive yesterday, patient with history of asymptomatic bacteriuria followed by Dr. Vanna Scotland.  T4 elevated and vitamin B12 level within normal limits.   Alert, pleasant, answers questions somewhat appropriately Seems to be using her right hand more, Abdomen soft, nontender. Incisions c/d/i  Lines/tubes/drains: PIV  A/P: POD 8 s/p robotic paraesophageal hernia with gastropexy -Surgery was uneventful, postop upper GI as expected -Continue pured diet -Continue delirium precautions (lights on, TV on, blinds open, keep awake and engaged throughout the day; at night from 10pm-6am keep lights off, TV off, blinds shut and minimize sleep interruptions) -CT 9/15 demonstrated new PCA infarct- Neurology consulted and recs completed.  -Appreciate evaluations and therapies from PT/OT/SLP. Dispo planning. CIR consult placed and evaluation in process -Please mobilize patient/ambulate at least once per shift as ordered.    -Very much appreciate assistance from [facility], neurology services  Discharge when CIR available.   Berna Bue MD  [facility] Surgery

## 2021-06-23 NOTE — Progress Notes (Signed)
Occupational Therapy Treatment Patient Details Name: Caroline [name] MRN: [id number] DOB: September 14, 1936 Today's Date: 06/23/2021   History of present illness Pt is an 85 year old female s/p status post robotic repair of paraesophageal hernia with gastropexy on 06/15/21 by Dr. Fredricka Bonine.  Pt with post operative lethargy and confusion;        MRI =  Large acute left PCA territory infarct without hemorrhage or mass  effect.  also per MD notes  Suspect that she has some low-grade underlying dementia which has been exacerbated by undergoing general anesthesia.  PMHx: CVA, scoliosis, DVT, GERD, HTN   OT comments  Patient very pleasant and agreeable to work with OT, requesting to use bathroom "I think my brief is wet." Patient needing overall min A with rolling walker to safely navigate her environment due to visual deficits. Min G assist in standing for safety while performing peri care and clothing management, does need increased time due to difficulty with depth perception doff/donning clean brief. Also needing min A to reach soap dispenser during hand hygiene. Patient is overall min G to min A for functional transfers/ambulation however is high fall risk due to visual deficits needing max multimodal cues, including for safe hand placement on walker on R side.   Recommendations for follow up therapy are one component of a multi-disciplinary discharge planning process, led by the attending physician.  Recommendations may be updated based on patient status, additional functional criteria and insurance authorization.    Follow Up Recommendations  CIR    Equipment Recommendations  Tub/shower seat       Precautions / Restrictions Precautions Precautions: Fall Precaution Comments: visual neglect R side       Mobility Bed Mobility               General bed mobility comments: in chair    Transfers Overall transfer level: Needs assistance Equipment used: Rolling walker (2 wheeled) Transfers: Sit  to/from Stand Sit to Stand: Min assist         General transfer comment: light min A to power up to standing from toilet. please see toilet transfer for further details in ADL section    Balance Overall balance assessment: Needs assistance Sitting-balance support: Feet supported Sitting balance-Leahy Scale: Fair     Standing balance support: No upper extremity supported Standing balance-Leahy Scale: Fair Standing balance comment: standing at sink without UE support to wash hands                           ADL either performed or assessed with clinical judgement   ADL Overall ADL's : Needs assistance/impaired     Grooming: Wash/dry hands;Minimal assistance;Standing Grooming Details (indicate cue type and reason): to reach for soap dispenser with R hand due to difficulty with visual scanning             Lower Body Dressing: Min guard;Sit to/from stand Lower Body Dressing Details (indicate cue type and reason): patient needing increased time to doff soiled briefs and don clean pair due to visual deficits, min G in standing for safety Toilet Transfer: Minimal assistance;Cueing for safety;Cueing for sequencing;Ambulation;Regular Toilet;RW;Grab bars Toilet Transfer Details (indicate cue type and reason): needs cues for proper hand placement especially on R side to hold proper place on walker frame due to difficulty visually scanning needing multimodal cues. light min A to power up to standing from toilet. Toileting- Architect and Hygiene: Min guard;Sit to/from stand;Sitting/lateral lean Toileting -  Clothing Manipulation Details (indicate cue type and reason): patient able to perform peri care after voiding in standing, min G for safety     Functional mobility during ADLs: Minimal assistance;Cueing for safety;Cueing for sequencing;Rolling walker General ADL Comments: continues to have difficulty safely navigating her environment with walker due to visual  deficits needing multimodal cues for scanning      Cognition Arousal/Alertness: Awake/alert Behavior During Therapy: WFL for tasks assessed/performed Overall Cognitive Status: Impaired/Different from baseline Area of Impairment: Orientation;Memory;Safety/judgement;Awareness;Problem solving                 Orientation Level: Disoriented to;Place;Time   Memory: Decreased short-term memory Following Commands: Follows one step commands with increased time Safety/Judgement: Decreased awareness of safety;Decreased awareness of deficits   Problem Solving: Requires verbal cues;Requires tactile cues General Comments: believes shes in a hospital in [place], cannot recall month even after being told and ~5 min time lapse, cannot accurately recall year. due to visual deficits needing multimodal cues for safety and problem solving during functional transfers and ADL tasks. Patient does acknowledge "my vision in this eye (referring to R) is bad today" but does not know why/limited insight to deficits. Patient is very pleasant and cooperative with therapy.                   Pertinent Vitals/ Pain       Pain Assessment: No/denies pain         Frequency  Min 2X/week        Progress Toward Goals  OT Goals(current goals can now be found in the care plan section)  Progress towards OT goals: Progressing toward goals  Acute Rehab OT Goals Patient Stated Goal: family hopes pt can return home with 24/7 care OT Goal Formulation: With patient/family Time For Goal Achievement: 07/02/21 Potential to Achieve Goals: Good ADL Goals Pt Will Perform Lower Body Dressing: with min guard assist;sit to/from stand;sitting/lateral leans Pt Will Transfer to Toilet: with supervision;ambulating;bedside commode (walker) Pt Will Perform Toileting - Clothing Manipulation and hygiene: with min guard assist;sit to/from stand;sitting/lateral leans Additional ADL Goal #1: patient will follow 1 step  directions with 25% multimodal cues in order to safely participate in self care tasks.  Plan Discharge plan remains appropriate       AM-PAC OT "6 Clicks" Daily Activity     Outcome Measure   Help from another person eating meals?: None Help from another person taking care of personal grooming?: A Little Help from another person toileting, which includes using toliet, bedpan, or urinal?: A Little Help from another person bathing (including washing, rinsing, drying)?: A Lot Help from another person to put on and taking off regular upper body clothing?: A Little Help from another person to put on and taking off regular lower body clothing?: A Little 6 Click Score: 18    End of Session Equipment Utilized During Treatment: Rolling walker  OT Visit Diagnosis: Unsteadiness on feet (R26.81);Other abnormalities of gait and mobility (R26.89);Muscle weakness (generalized) (M62.81);Other symptoms and signs involving cognitive function   Activity Tolerance Patient tolerated treatment well   Patient Left in chair;with call bell/phone within reach;with chair alarm set;with family/visitor present   Nurse Communication Mobility status        Time: 1610-9604 OT Time Calculation (min): 18 min  Charges: OT General Charges $OT Visit: 1 Visit OT Treatments $Self Care/Home Management : 8-22 mins  Marlyce Huge OT OT pager: [phone]   Carmelia Roller 06/23/2021, 1:29 PM

## 2021-06-23 NOTE — Progress Notes (Signed)
Physical Therapy Treatment Patient Details Name: Caroline [name] MRN: [id number] DOB: 08-28-1936 Today's Date: 06/23/2021   History of Present Illness Pt is an 85 year old female s/p status post robotic repair of paraesophageal hernia with gastropexy on 06/15/21 by Dr. Kae Heller.  Pt with post operative lethargy and confusion;        MRI =  Large acute left PCA territory infarct without hemorrhage or mass  effect.  also per MD notes  Suspect that she has some low-grade underlying dementia which has been exacerbated by undergoing general anesthesia.  PMHx: CVA, scoliosis, DVT, GERD, HTN    PT Comments     Patient pleasantly confused with memory deficits. Patient participated in ambulation x 120' using RW, assist with turns and avoiding objects in right visual filed.and locating objects and directional gait to her right.  Continue PT.   Recommendations for follow up therapy are one component of a multi-disciplinary discharge planning process, led by the attending physician.  Recommendations may be updated based on patient status, additional functional criteria and insurance authorization.  Follow Up Recommendations  CIR     Equipment Recommendations  None recommended by PT    Recommendations for Other Services       Precautions / Restrictions Precautions Precautions: Fall Precaution Comments: visual neglect R side     Mobility  Bed Mobility   Bed Mobility: Supine to Sit     Supine to sit: Supervision     General bed mobility comments: extra time, cues to initiate activity and cues  to complete, pt asks  multiplee times what she should be doing.    Transfers Overall transfer level: Needs assistance Equipment used: Rolling walker (2 wheeled) Transfers: Sit to/from Stand Sit to Stand: Min assist         General transfer comment: light min A to power up to standing from bed and  toilet. cues to use rail. Posture appears more flexed today. noted significant  scoliosis  Ambulation/Gait Ambulation/Gait assistance: Min assist Gait Distance (Feet): 120 Feet Assistive device: Rolling walker (2 wheeled) Gait Pattern/deviations: Step-to pattern;Trunk flexed Gait velocity: decreased   General Gait Details: constant cues for right visual deficits, assist with turning RW, keeping in straight direction. Frequent cues to catch up to RW and stand more erect, look ahead. Patient required mutiple cues to find chair on her right.   Stairs             Wheelchair Mobility    Modified Rankin (Stroke Patients Only)       Balance Overall balance assessment: Needs assistance Sitting-balance support: Feet supported Sitting balance-Leahy Scale: Good     Standing balance support: Single extremity supported;During functional activity Standing balance-Leahy Scale: Fair Standing balance comment: standing from toilet to  wipe post BM. and wash hands at sink                            Cognition Arousal/Alertness: Awake/alert Behavior During Therapy: WFL for tasks assessed/performed Overall Cognitive Status: Impaired/Different from baseline Area of Impairment: Orientation                 Orientation Level: Disoriented to;Place;Time Current Attention Level: Sustained Memory: Decreased short-term memory Following Commands: Follows one step commands with increased time Safety/Judgement: Decreased awareness of safety;Decreased awareness of deficits Awareness: Emergent Problem Solving: Requires verbal cues;Requires tactile cues General Comments: neighbor in to visit, repeated several times that her neighbor was visiting and if I had  met her.      Exercises      General Comments        Pertinent Vitals/Pain Pain Assessment: No/denies pain    Home Living                      Prior Function            PT Goals (current goals can now be found in the care plan section) Acute Rehab PT Goals Patient Stated Goal:  family hopes pt can return home with 24/7 care Progress towards PT goals: Progressing toward goals    Frequency    Min 3X/week      PT Plan Current plan remains appropriate    Co-evaluation              AM-PAC PT "6 Clicks" Mobility   Outcome Measure  Help needed turning from your back to your side while in a flat bed without using bedrails?: A Little Help needed moving from lying on your back to sitting on the side of a flat bed without using bedrails?: A Little Help needed moving to and from a bed to a chair (including a wheelchair)?: A Little Help needed standing up from a chair using your arms (e.g., wheelchair or bedside chair)?: A Little Help needed to walk in hospital room?: A Little Help needed climbing 3-5 steps with a railing? : A Little 6 Click Score: 18    End of Session Equipment Utilized During Treatment: Gait belt Activity Tolerance: Patient tolerated treatment well Patient left: with call bell/phone within reach;with bed alarm set;with family/visitor present Nurse Communication: Mobility status PT Visit Diagnosis: Difficulty in walking, not elsewhere classified (R26.2);Unsteadiness on feet (R26.81)     Time: 2248-2500 PT Time Calculation (min) (ACUTE ONLY): 39 min  Charges:  $Gait Training: 23-37 mins $Self Care/Home Management: [facility] Acute Rehabilitation Services Pager [phone] Office [phone]    Claretha Cooper 06/23/2021, 3:14 PM

## 2021-06-23 NOTE — Progress Notes (Signed)
PROGRESS NOTE    Caroline [name]  RWE:[id number] DOB: [birth year] DOA: 06/15/2021 PCP: Gaspar Garbe, MD   Chief Complain: Altered mental status  Brief Narrative:  Patient is a 85 year old female with history of prior CVA, mild cognitive impairment, symptomatic paraesophageal hernia followed by GI/general surgery, GERD, hypertension, hyperlipidemia who was admitted here on 9/12 on general surgery service for the repair of paraesophageal hernia.  She underwent surgery on 9/12 with robotic paraesophageal hernia repair, gastropexy and upper endoscopy.  Post surgery, she developed confusion, worsening of mental status.  Brain imaging showed acute large left PCA territory infarct without hemorrhage/mass-effect.  Neurology was consulted and following.  Recommend aspirin Plavix for 21 days followed by Plavix alone.  We were consulted because patient had persistent confusion, increased sleepiness.  Mental status has improved and she is alert and awake. eventual plan is to discharge to CIR. patient can be discharged to CIR whenever possible.  Urine culture can be followed at CIR  Assessment & Plan:   Active Problems:   S/P repair of paraesophageal hernia   Delirium due to multiple etiologies   Acute metabolic encephalopathy in the setting of recent CVA: Patient has mild cognitive issues at baseline.  She was found to have acute CVA during this hospitalization. Hospital acquired delirium needs also possibility.. At baseline, she is functional and was driving before this admission. Work-up was proceeded.  Urinalysis showed questionable evidence of UTI with moderate leukocytes, positive nitrates, bacteria.  Urine culture is pending.  Started on ciprofloxacin.  She has penicillin listed as allergy but not severe.  I will recommend changing the antibiotics to ceftriaxone.  Her previous urine culture showed E. coli sensitive to cephalosporin.  Want to avoid fluoroquinolones in this elderly female. Follow-up CT  imaging did not show any Acute intracranial abnormalities.  Vitamin B12 slightly on the lower side, currently being supplemented. She is on low-dose Seroquel at bedtime. Her mental status has improved.  She is not sleepy, she is ambulating with assistance, follows commands, communicates well, knows that she is in the hospital but not oriented to time  Recent [facility] imaging showed acute large left PCA territory infarct without hemorrhage/mass-effect.  Neurology was consulted and were following.  Recommend aspirin  and Plavix for 21 days followed by Plavix alone.  Looks like she does not have any focal neurological deficits.  She was ambulating to her bathroom with assistance  Hypothyroidism: TSH elevated at 8. T3 of 1.3.  Picture consistent with euthyroid sick syndrome.  Could be associated with acute/critical illness.  However,we  have recommended increment in the Synthyroid dose.  Follow-up TSH in 3 to 4 weeks.  Hypertension: Monitor blood pressure.  Continue current medications  Hyperlipidemia: Continue statin  Disposition/deconditioning/debility: PT/OT recommending inpatient rehab.         Procedures:As above  Antimicrobials:  Anti-infectives (From admission, onward)    Start     Dose/Rate Route Frequency Ordered Stop   06/22/21 1800  ciprofloxacin (CIPRO) IVPB 200 mg        200 mg 100 mL/hr over 60 Minutes Intravenous Every 12 hours 06/22/21 1630 06/27/21 1759   06/15/21 0600  ceFAZolin (ANCEF) IVPB 2g/100 mL premix        2 g 200 mL/hr over 30 Minutes Intravenous On call to O.R. 06/15/21 6712 06/15/21 0755       Subjective:  Patient seen and examined at the bedside this morning.  Daughter at the bedside.  Patient was in her bathroom.  She was able  to answer my questions, she was standing on her feet with assistance and did not look in any kind of distress.  Objective: Vitals:   06/22/21 0550 06/22/21 1319 06/22/21 1952 06/23/21 0519  BP: (!) 117/51 (!) 129/47 120/62  (!) 130/53  Pulse: 72 87 86 75  Resp: 18  18 20   Temp: (!) 97.5 F (36.4 C) 97.7 F (36.5 C) (!) 97.4 F (36.3 C) 98.1 F (36.7 C)  TempSrc: Oral Oral Oral Oral  SpO2: 95% 96% 95% 94%  Weight:      Height:        Intake/Output Summary (Last 24 hours) at 06/23/2021 06/25/2021 Last data filed at 06/23/2021 06/25/2021 Gross per 24 hour  Intake 0 ml  Output 550 ml  Net -550 ml   Filed Weights   06/15/21 0550 06/17/21 1930  Weight: 62.1 kg 63.1 kg    Examination:  General exam: Overall comfortable, not in distress, deconditioned, debilitated elderly female HEENT: PERRL Respiratory system:  no wheezes or crackles  Cardiovascular system: S1 & S2 heard, RRR.  Gastrointestinal system: Abdomen is nondistended, soft and nontender. Central nervous system: Alert and awake, oriented to place only Extremities: No edema, no clubbing ,no cyanosis Skin: No rashes, no ulcers,no icterus      Data Reviewed: I have personally reviewed following labs and imaging studies  CBC: Recent Labs  Lab 06/22/21 1018 06/23/21 0515  WBC 7.1 6.5  NEUTROABS 4.7  --   HGB 11.6* 11.4*  HCT 35.6* 35.4*  MCV 88.6 87.6  PLT 298 281   Basic Metabolic Panel: Recent Labs  Lab 06/23/21 0515  NA 140  K 3.8  CL 106  CO2 27  GLUCOSE 92  BUN 15  CREATININE 0.62  CALCIUM 8.6*  MG 2.2   GFR: Estimated Creatinine Clearance: 46 mL/min (by C-G formula based on SCr of 0.62 mg/dL). Liver Function Tests: Recent Labs  Lab 06/23/21 0515  AST 27  ALT 23  ALKPHOS 80  BILITOT 0.4  PROT 5.9*  ALBUMIN 3.2*   No results for input(s): LIPASE, AMYLASE in the last 168 hours. No results for input(s): AMMONIA in the last 168 hours. Coagulation Profile: No results for input(s): INR, PROTIME in the last 168 hours. Cardiac Enzymes: No results for input(s): CKTOTAL, CKMB, CKMBINDEX, TROPONINI in the last 168 hours. BNP (last 3 results) No results for input(s): PROBNP in the last 8760 hours. HbA1C: No results for  input(s): HGBA1C in the last 72 hours. CBG: No results for input(s): GLUCAP in the last 168 hours. Lipid Profile: No results for input(s): CHOL, HDL, LDLCALC, TRIG, CHOLHDL, LDLDIRECT in the last 72 hours. Thyroid Function Tests: Recent Labs    06/22/21 1018  TSH 8.036*  FREET4 0.99  T3FREE 1.3*   Anemia Panel: Recent Labs    06/22/21 1018  VITAMINB12 526   Sepsis Labs: No results for input(s): PROCALCITON, LATICACIDVEN in the last 168 hours.  No results found for this or any previous visit (from the past 240 hour(s)).       Radiology Studies: CT HEAD WO CONTRAST (06/24/21)  Result Date: 06/22/2021 CLINICAL DATA:  Follow-up stroke EXAM: CT HEAD WITHOUT CONTRAST TECHNIQUE: Contiguous axial images were obtained from the base of the skull through the vertex without intravenous contrast. COMPARISON:  CT and MRI studies 06/18/2021 FINDINGS: Brain: No focal abnormality affects the brainstem or cerebellum. Acute infarction in the left PCA territory affecting the posteromedial temporal lobe, occipital lobe and left thalamus as seen previously, with low-density  and swelling. No hemorrhage. Old ischemic changes of the thalami, basal ganglia and cerebral hemispheric white matter. No hydrocephalus or extra-axial collection. Vascular: There is atherosclerotic calcification of the major vessels at the base of the brain. Skull: Negative Sinuses/Orbits: Clear/normal Other: None IMPRESSION: Acute infarction affecting the left PCA territory as described above and as seen previously. No new or progressive finding. No hemorrhagic transformation. Extensive small-vessel ischemic changes elsewhere affecting the thalami, basal ganglia and hemispheric white matter. Electronically Signed   By: Paulina Fusi M.D.   On: 06/22/2021 11:11        Scheduled Meds:  aspirin EC  81 mg Oral Daily   atorvastatin  80 mg Oral QHS   clopidogrel  75 mg Oral Daily   docusate sodium  100 mg Oral BID   enoxaparin (LOVENOX)  injection  40 mg Subcutaneous Q24H   feeding supplement  1 Container Oral TID BM   hydrocortisone cream   Topical BID   irbesartan  150 mg Oral Daily   levothyroxine  125 mcg Oral Q0600   nystatin   Topical BID   pantoprazole  40 mg Oral QHS   QUEtiapine  25 mg Oral QHS   vitamin B-12  250 mcg Oral Daily   Continuous Infusions:  ciprofloxacin 200 mg (06/23/21 0629)     LOS: 6 days    Time spent: 35 mins,.More than 50% of that time was spent in counseling and/or coordination of care.      Burnadette Pop, MD [facility] P9/20/2022, 8:32 AM

## 2021-06-23 NOTE — Progress Notes (Signed)
Inpatient Rehab Admissions Coordinator:    I do not have insurance auth or a bed for this pt. On CIR today. I will continue to follow for admission pending insurance auth and bed availability.   Megan Salon, MS, CCC-SLP Rehab Admissions Coordinator  [phone] (celll) [phone] (office)

## 2021-06-24 ENCOUNTER — Other Ambulatory Visit: Payer: Self-pay

## 2021-06-24 ENCOUNTER — Encounter (HOSPITAL_COMMUNITY): Payer: Self-pay | Admitting: Physical Medicine & Rehabilitation

## 2021-06-24 ENCOUNTER — Inpatient Hospital Stay (HOSPITAL_COMMUNITY)
Admission: RE | Admit: 2021-06-24 | Discharge: 2021-07-07 | DRG: 092 | Disposition: A | Discharge status: Home Health | Payer: Medicare Other | Source: Other Acute Inpatient Hospital | Attending: Physical Medicine & Rehabilitation | Admitting: Physical Medicine & Rehabilitation

## 2021-06-24 DIAGNOSIS — I6622 Occlusion and stenosis of left posterior cerebral artery: Secondary | ICD-10-CM | POA: Diagnosis not present

## 2021-06-24 DIAGNOSIS — F05 Delirium due to known physiological condition: Secondary | ICD-10-CM | POA: Diagnosis not present

## 2021-06-24 DIAGNOSIS — N390 Urinary tract infection, site not specified: Secondary | ICD-10-CM | POA: Diagnosis not present

## 2021-06-24 DIAGNOSIS — Z8261 Family history of arthritis: Secondary | ICD-10-CM

## 2021-06-24 DIAGNOSIS — Z8719 Personal history of other diseases of the digestive system: Secondary | ICD-10-CM

## 2021-06-24 DIAGNOSIS — J302 Other seasonal allergic rhinitis: Secondary | ICD-10-CM | POA: Diagnosis present

## 2021-06-24 DIAGNOSIS — J189 Pneumonia, unspecified organism: Secondary | ICD-10-CM | POA: Diagnosis not present

## 2021-06-24 DIAGNOSIS — Z888 Allergy status to other drugs, medicaments and biological substances status: Secondary | ICD-10-CM

## 2021-06-24 DIAGNOSIS — I639 Cerebral infarction, unspecified: Secondary | ICD-10-CM

## 2021-06-24 DIAGNOSIS — R2689 Other abnormalities of gait and mobility: Secondary | ICD-10-CM | POA: Diagnosis present

## 2021-06-24 DIAGNOSIS — F03918 Unspecified dementia, unspecified severity, with other behavioral disturbance: Secondary | ICD-10-CM | POA: Diagnosis present

## 2021-06-24 DIAGNOSIS — E039 Hypothyroidism, unspecified: Secondary | ICD-10-CM | POA: Diagnosis present

## 2021-06-24 DIAGNOSIS — D649 Anemia, unspecified: Secondary | ICD-10-CM | POA: Diagnosis present

## 2021-06-24 DIAGNOSIS — H53461 Homonymous bilateral field defects, right side: Secondary | ICD-10-CM | POA: Diagnosis present

## 2021-06-24 DIAGNOSIS — I6300 Cerebral infarction due to thrombosis of unspecified precerebral artery: Secondary | ICD-10-CM | POA: Diagnosis not present

## 2021-06-24 DIAGNOSIS — E785 Hyperlipidemia, unspecified: Secondary | ICD-10-CM | POA: Diagnosis present

## 2021-06-24 DIAGNOSIS — F419 Anxiety disorder, unspecified: Secondary | ICD-10-CM | POA: Diagnosis present

## 2021-06-24 DIAGNOSIS — Z8672 Personal history of thrombophlebitis: Secondary | ICD-10-CM

## 2021-06-24 DIAGNOSIS — Z9889 Other specified postprocedural states: Secondary | ICD-10-CM

## 2021-06-24 DIAGNOSIS — Z79891 Long term (current) use of opiate analgesic: Secondary | ICD-10-CM

## 2021-06-24 DIAGNOSIS — K219 Gastro-esophageal reflux disease without esophagitis: Secondary | ICD-10-CM | POA: Diagnosis present

## 2021-06-24 DIAGNOSIS — Z7902 Long term (current) use of antithrombotics/antiplatelets: Secondary | ICD-10-CM

## 2021-06-24 DIAGNOSIS — Z880 Allergy status to penicillin: Secondary | ICD-10-CM

## 2021-06-24 DIAGNOSIS — Z91018 Allergy to other foods: Secondary | ICD-10-CM

## 2021-06-24 DIAGNOSIS — Z85828 Personal history of other malignant neoplasm of skin: Secondary | ICD-10-CM

## 2021-06-24 DIAGNOSIS — J9811 Atelectasis: Secondary | ICD-10-CM | POA: Diagnosis not present

## 2021-06-24 DIAGNOSIS — I10 Essential (primary) hypertension: Secondary | ICD-10-CM | POA: Diagnosis present

## 2021-06-24 DIAGNOSIS — R4189 Other symptoms and signs involving cognitive functions and awareness: Secondary | ICD-10-CM | POA: Diagnosis present

## 2021-06-24 DIAGNOSIS — I69398 Other sequelae of cerebral infarction: Secondary | ICD-10-CM

## 2021-06-24 DIAGNOSIS — Z7984 Long term (current) use of oral hypoglycemic drugs: Secondary | ICD-10-CM

## 2021-06-24 DIAGNOSIS — E8809 Other disorders of plasma-protein metabolism, not elsewhere classified: Secondary | ICD-10-CM | POA: Diagnosis present

## 2021-06-24 DIAGNOSIS — D72829 Elevated white blood cell count, unspecified: Secondary | ICD-10-CM

## 2021-06-24 DIAGNOSIS — B9620 Unspecified Escherichia coli [E. coli] as the cause of diseases classified elsewhere: Secondary | ICD-10-CM | POA: Diagnosis not present

## 2021-06-24 DIAGNOSIS — Z885 Allergy status to narcotic agent status: Secondary | ICD-10-CM

## 2021-06-24 DIAGNOSIS — Z8249 Family history of ischemic heart disease and other diseases of the circulatory system: Secondary | ICD-10-CM

## 2021-06-24 DIAGNOSIS — Z86718 Personal history of other venous thrombosis and embolism: Secondary | ICD-10-CM

## 2021-06-24 DIAGNOSIS — E876 Hypokalemia: Secondary | ICD-10-CM | POA: Diagnosis not present

## 2021-06-24 DIAGNOSIS — G8929 Other chronic pain: Secondary | ICD-10-CM | POA: Diagnosis present

## 2021-06-24 DIAGNOSIS — Z86008 Personal history of in-situ neoplasm of other site: Secondary | ICD-10-CM

## 2021-06-24 DIAGNOSIS — I63112 Cerebral infarction due to embolism of left vertebral artery: Secondary | ICD-10-CM | POA: Diagnosis not present

## 2021-06-24 DIAGNOSIS — M25552 Pain in left hip: Secondary | ICD-10-CM | POA: Diagnosis not present

## 2021-06-24 DIAGNOSIS — Z79890 Hormone replacement therapy (postmenopausal): Secondary | ICD-10-CM

## 2021-06-24 DIAGNOSIS — Z79899 Other long term (current) drug therapy: Secondary | ICD-10-CM

## 2021-06-24 DIAGNOSIS — Z90710 Acquired absence of both cervix and uterus: Secondary | ICD-10-CM

## 2021-06-24 DIAGNOSIS — I69391 Dysphagia following cerebral infarction: Secondary | ICD-10-CM | POA: Diagnosis not present

## 2021-06-24 DIAGNOSIS — K449 Diaphragmatic hernia without obstruction or gangrene: Secondary | ICD-10-CM | POA: Diagnosis present

## 2021-06-24 DIAGNOSIS — M810 Age-related osteoporosis without current pathological fracture: Secondary | ICD-10-CM | POA: Diagnosis present

## 2021-06-24 DIAGNOSIS — R414 Neurologic neglect syndrome: Secondary | ICD-10-CM | POA: Diagnosis present

## 2021-06-24 LAB — CBC
HCT: 38.4 % (ref 36.0–46.0)
Hemoglobin: 12.4 g/dL (ref 12.0–15.0)
MCH: 28.6 pg (ref 26.0–34.0)
MCHC: 32.3 g/dL (ref 30.0–36.0)
MCV: 88.5 fL (ref 80.0–100.0)
Platelets: 356 10*3/uL (ref 150–400)
RBC: 4.34 MIL/uL (ref 3.87–5.11)
RDW: 16.5 % — ABNORMAL HIGH (ref 11.5–15.5)
WBC: 7.1 10*3/uL (ref 4.0–10.5)
nRBC: 0 % (ref 0.0–0.2)

## 2021-06-24 LAB — CREATININE, SERUM
Creatinine, Ser: 0.76 mg/dL (ref 0.44–1.00)
GFR, Estimated: >60 mL/min (ref >60)

## 2021-06-24 MED ORDER — CYANOCOBALAMIN 500 MCG PO TABS
250.0000 ug | ORAL_TABLET | Freq: Every day | ORAL | Status: DC
  Administered 2021-06-25 – 2021-07-07 (×13): 250 ug via ORAL
  Filled 2021-06-24 (×13): qty 1

## 2021-06-24 MED ORDER — ENOXAPARIN SODIUM 40 MG/0.4ML IJ SOSY: 40.0000 mg | PREFILLED_SYRINGE | INTRAMUSCULAR | Status: DC

## 2021-06-24 MED ORDER — POLYVINYL ALCOHOL 1.4 % OP SOLN
1.0000 [drp] | OPHTHALMIC | Status: DC | PRN
  Administered 2021-06-24 – 2021-06-26 (×3): 1 [drp] via OPHTHALMIC
  Filled 2021-06-24: qty 15

## 2021-06-24 MED ORDER — ENOXAPARIN SODIUM 40 MG/0.4ML IJ SOSY
40.0000 mg | PREFILLED_SYRINGE | INTRAMUSCULAR | Status: DC
  Administered 2021-06-25 – 2021-06-30 (×6): 40 mg via SUBCUTANEOUS
  Filled 2021-06-24 (×6): qty 0.4

## 2021-06-24 MED ORDER — BISACODYL 10 MG RE SUPP: 10.0000 mg | Freq: Every day | RECTAL | Status: DC | PRN

## 2021-06-24 MED ORDER — ONDANSETRON HCL 4 MG/2ML IJ SOLN: 4.0000 mg | Freq: Four times a day (QID) | INTRAMUSCULAR | Status: DC | PRN

## 2021-06-24 MED ORDER — LEVOTHYROXINE SODIUM 125 MCG PO TABS: 125.0000 ug | ORAL_TABLET | Freq: Every day | ORAL | 0 refills | Status: DC

## 2021-06-24 MED ORDER — NYSTATIN 100000 UNIT/GM EX POWD: Freq: Two times a day (BID) | CUTANEOUS | 0 refills | Status: DC

## 2021-06-24 MED ORDER — PANTOPRAZOLE SODIUM 40 MG PO TBEC: 40.0000 mg | DELAYED_RELEASE_TABLET | Freq: Every day | ORAL | Status: DC

## 2021-06-24 MED ORDER — BOOST / RESOURCE BREEZE PO LIQD CUSTOM
1.0000 | Freq: Three times a day (TID) | ORAL | Status: DC
  Administered 2021-06-24 – 2021-07-06 (×32): 1 via ORAL

## 2021-06-24 MED ORDER — HYDROCORTISONE 1 % EX CREA: TOPICAL_CREAM | Freq: Two times a day (BID) | CUTANEOUS | 0 refills | Status: DC

## 2021-06-24 MED ORDER — ATORVASTATIN CALCIUM 80 MG PO TABS
80.0000 mg | ORAL_TABLET | Freq: Every day | ORAL | Status: DC
  Administered 2021-06-24 – 2021-07-06 (×13): 80 mg via ORAL
  Filled 2021-06-24 (×13): qty 1

## 2021-06-24 MED ORDER — CEFTRIAXONE SODIUM 1 G IJ SOLR
1.0000 g | INTRAMUSCULAR | Status: AC
  Administered 2021-06-24 – 2021-06-27 (×4): 1 g via INTRAVENOUS
  Filled 2021-06-24 (×4): qty 10

## 2021-06-24 MED ORDER — QUETIAPINE FUMARATE 25 MG PO TABS
25.0000 mg | ORAL_TABLET | Freq: Every day | ORAL | Status: DC
  Administered 2021-06-24 – 2021-07-06 (×13): 25 mg via ORAL
  Filled 2021-06-24 (×13): qty 1

## 2021-06-24 MED ORDER — DOCUSATE SODIUM 100 MG PO CAPS
100.0000 mg | ORAL_CAPSULE | Freq: Two times a day (BID) | ORAL | Status: DC
  Administered 2021-06-24 – 2021-06-30 (×12): 100 mg via ORAL
  Filled 2021-06-24 (×12): qty 1

## 2021-06-24 MED ORDER — BLOOD PRESSURE CONTROL BOOK
Freq: Once | Status: AC
  Filled 2021-06-24: qty 1

## 2021-06-24 MED ORDER — ASPIRIN EC 81 MG PO TBEC
81.0000 mg | DELAYED_RELEASE_TABLET | Freq: Every day | ORAL | Status: DC
  Administered 2021-06-25 – 2021-07-07 (×13): 81 mg via ORAL
  Filled 2021-06-24 (×13): qty 1

## 2021-06-24 MED ORDER — PANTOPRAZOLE SODIUM 40 MG PO TBEC
40.0000 mg | DELAYED_RELEASE_TABLET | Freq: Two times a day (BID) | ORAL | Status: DC
  Administered 2021-06-24 – 2021-07-07 (×26): 40 mg via ORAL
  Filled 2021-06-24 (×26): qty 1

## 2021-06-24 MED ORDER — NYSTATIN 100000 UNIT/GM EX POWD
Freq: Two times a day (BID) | CUTANEOUS | Status: DC
  Filled 2021-06-24: qty 15

## 2021-06-24 MED ORDER — ACETAMINOPHEN 325 MG PO TABS
650.0000 mg | ORAL_TABLET | Freq: Four times a day (QID) | ORAL | Status: DC | PRN
  Administered 2021-07-03 – 2021-07-06 (×7): 650 mg via ORAL
  Filled 2021-06-24 (×8): qty 2

## 2021-06-24 MED ORDER — IRBESARTAN 75 MG PO TABS
150.0000 mg | ORAL_TABLET | Freq: Every day | ORAL | Status: DC
  Administered 2021-06-25 – 2021-07-03 (×9): 150 mg via ORAL
  Filled 2021-06-24 (×10): qty 2

## 2021-06-24 MED ORDER — ONDANSETRON 4 MG PO TBDP
4.0000 mg | ORAL_TABLET | Freq: Four times a day (QID) | ORAL | Status: DC | PRN
  Administered 2021-06-24 – 2021-07-04 (×2): 4 mg via ORAL
  Filled 2021-06-24 (×2): qty 1

## 2021-06-24 MED ORDER — HYDROCORTISONE 1 % EX CREA
TOPICAL_CREAM | Freq: Two times a day (BID) | CUTANEOUS | Status: DC
  Administered 2021-06-25: 1 via TOPICAL
  Filled 2021-06-24: qty 28

## 2021-06-24 MED ORDER — CLOPIDOGREL BISULFATE 75 MG PO TABS
75.0000 mg | ORAL_TABLET | Freq: Every day | ORAL | Status: DC
  Administered 2021-06-25 – 2021-07-07 (×13): 75 mg via ORAL
  Filled 2021-06-24 (×13): qty 1

## 2021-06-24 MED ORDER — CYANOCOBALAMIN 250 MCG PO TABS: 250.0000 ug | ORAL_TABLET | Freq: Every day | ORAL | Status: DC

## 2021-06-24 MED ORDER — LEVOTHYROXINE SODIUM 25 MCG PO TABS
125.0000 ug | ORAL_TABLET | Freq: Every day | ORAL | Status: DC
  Administered 2021-06-25 – 2021-07-07 (×13): 125 ug via ORAL
  Filled 2021-06-24 (×13): qty 1

## 2021-06-24 NOTE — H&P (Signed)
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  History taken from chart review patient and husband.  Patient lives with spouse.  Two-level home bed and bath main level with one-step to entry.  Reportedly independent prior to admission able to perform her own ADLs although she did need supervision assist.  Presented to [facility] on 06/15/2021 with intermittent heartburn and episodes of severe regurgitation.  She had an endoscopy in February 2020 confirming grade D reflux esophagitis and again June 2020 with little improvement in endoscopic findings on high-dose PPI.  Biopsies were negative.  She had a normal gastric emptying study in 2020.  Follow-up general surgery patient underwent robotic paraesophageal hernia repair, gastropexy, upper endoscopy 06/15/2021 per Dr. Kae Heller.  Her diet was slowly advanced.  Post procedure noted somnolence increased disorientation.  CT/MRI showed large acute left PCA territory infarct without hemorrhage or mass-effect.  Patient did not receive tPA.  MRA showed occlusion of the left PCA at the distal P2 segment.  Severe atrophy and chronic small vessel ischemia.  Echocardiogram with ejection fraction of 60 to 78%, grade 1 diastolic dysfunction. Neurology follow-up maintained on aspirin as well as Plavix x21 days then Plavix alone.  Lovenox for DVT prophylaxis.  Preliminary urine culture 06/22/2021 greater 100,000 gram-negative rods currently maintained on IV Rocephin.  Therapy evaluations completed due weakness, limitations in self-care after recent GI procedure was admitted for a comprehensive rehab program. Please see preadmission assessment from earlier today as well.   Review of Systems   Constitutional:        Decreased p.o. intake  HENT:  Negative for hearing loss.   Eyes:  Negative for blurred vision and double vision.  Respiratory:  Negative for cough and shortness of breath.   Cardiovascular:  Negative for chest pain, palpitations and leg swelling.  Gastrointestinal:  Positive for heartburn. Negative for nausea.       Intermittent bouts of regurgitation  Genitourinary:  Negative for dysuria, flank pain and hematuria.  Musculoskeletal:  Negative for joint pain and myalgias.  Skin:  Negative for rash.  Neurological:  Positive for weakness.  Psychiatric/Behavioral:  Positive for memory loss.        Anxiety  All other systems reviewed and are negative. 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 Cheek   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   Bridge Left Nose, and Left Neck   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 Cheek Tx: curret x 3 and 5FU   Squamous  cell carcinoma in situ (SCCIS) 08/02/2018   Bridge of nose tx Cx3 and 5FU, Left neck Tx Cx3 and 5FU   Thrombophlebitis    Past Surgical History:  Procedure Laterality Date   ABDOMINAL HYSTERECTOMY  1979   CATARACT EXTRACTION, BILATERAL     knee surgery Left    VARICOSE VEIN SURGERY Left    XI ROBOTIC ASSISTED PARAESOPHAGEAL HERNIA REPAIR N/A 06/15/2021   Procedure: XI ROBOTIC ASSISTED  PARAESOPHAGEAL HERNIA REPAIR, GASTROPEXY, UPPER ENDOSCOPY;  Surgeon: Clovis Riley, MD;  Location: [facility];  Service: General;  Laterality: N/A;   Family History  Problem Relation Age of Onset   Arthritis Mother        pt denies   Hypertension Mother        pt denies   Obesity Sister    Cancer Maternal Uncle        type unknown   Colon cancer Neg Hx    Stomach cancer Neg Hx    Esophageal cancer Neg Hx    Pancreatic cancer Neg Hx    Social History:  reports that she has never smoked. She has never used smokeless tobacco. She reports that she does not currently use drugs. She reports that she does not drink alcohol. Allergies:  Allergies  Allergen Reactions   Gluten Meal Nausea And Vomiting    Patient has CELIAC DISEASE   Wheat Bran Nausea And Vomiting    Patient has CELIAC DISEASE   Adhesive [Tape] Other (See Comments)    Patient's skin is VERY THIN and tears very easily; please use either paper tape or Coban wrap   Codeine Nausea And Vomiting   Other Other (See Comments)    Patient has Hypoglycemia; her husband stated "when her sugar crashes, it crashes hard."   Penicillins Other (See Comments)    Doesn't remember reaction , but denies any SOB/Swelling States " it was a long time ago "    Medications Prior to Admission  Medication Sig Dispense Refill   acetaminophen (TYLENOL) 650 MG CR tablet Take 1,300 mg by mouth every 8 (eight) hours as needed for pain.     Ascorbic Acid (VITAMIN C) 100 MG tablet Take 100 mg by mouth every other day.      atorvastatin (LIPITOR) 40 MG tablet TAKE 1 TABLET (40 MG TOTAL) BY MOUTH DAILY. 30 tablet 3   Cholecalciferol (VITAMIN D-3) 1000 units CAPS Take 1,000 Units by mouth daily.     Coenzyme Q10 (COQ10 PO) Take 1 tablet by mouth daily.     hydrochlorothiazide (MICROZIDE) 12.5 MG capsule Take 12.5 mg by mouth daily as needed (fluid).     irbesartan (AVAPRO) 150 MG tablet Take 150 mg by mouth daily.      levothyroxine (SYNTHROID) 112 MCG  tablet Take 112 mcg by mouth daily.     Omega-3 Fatty Acids (FISH OIL PO) Take 1,000 mg by mouth daily.     ondansetron (ZOFRAN-ODT) 4 MG disintegrating tablet Take 4 mg by mouth every 8 (eight) hours as needed for nausea or vomiting.     pantoprazole (PROTONIX) 40 MG tablet TAKE 1 TABLET BY MOUTH 2 TIMES DAILY BEFORE A MEAL. (Patient taking differently: Take 40 mg by mouth 2 (two) times daily before a meal.) 180 tablet 1   Potassium 99 MG TABS Take 99 mg by mouth daily.     simethicone (MYLICON) 119 MG chewable tablet Chew 125 mg by mouth every 6 (six) hours as needed for flatulence.  clopidogrel (PLAVIX) 75 MG tablet Take 75 mg by mouth daily. (Patient not taking: No sig reported)     traMADol (ULTRAM) 50 MG tablet Take 25 mg by mouth every 6 (six) hours as needed for moderate pain. (Patient not taking: No sig reported)      Drug Regimen Review Drug regimen was reviewed and remains appropriate with no significant issues identified  Home: Home Living Family/patient expects to be discharged to:: Private residence Living Arrangements: Spouse/significant other Available Help at Discharge: Family, Available PRN/intermittently Type of Home: House Home Access: Stairs to enter [employer] of Steps: 3 steps in front; 1 step in garage Entrance Stairs-Rails: None Home Layout: Two level, Able to live on main level with bedroom/bathroom Bathroom Shower/Tub: Multimedia programmer: Standard Bathroom Accessibility: Yes Home Equipment: Environmental consultant - 2 wheels, [place] - single point  Lives With: Spouse   Functional History: Prior Function Level of Independence: Independent Comments: typically ambulatory at home without assistive device, able to perform ADLs  Functional Status:  Mobility: Bed Mobility Overal bed mobility: Needs Assistance Bed Mobility: Supine to Sit Supine to sit: Supervision Sit to supine: Mod assist General bed mobility comments: extra time, cues to initiate  activity and cues  to complete, pt asks  multiplee times what she should be doing. Transfers Overall transfer level: Needs assistance Equipment used: Rolling walker (2 wheeled) Transfers: Sit to/from Stand Sit to Stand: Min assist Stand pivot transfers: Min guard General transfer comment: light min A to power up to standing from bed and  toilet. cues to use rail. Posture appears more flexed today. noted significant scoliosis Ambulation/Gait Ambulation/Gait assistance: Min assist Gait Distance (Feet): 120 Feet Assistive device: Rolling walker (2 wheeled) Gait Pattern/deviations: Step-to pattern, Trunk flexed General Gait Details: constant cues for right visual deficits, assist with turning RW, keeping in straight direction. Frequent cues to catch up to RW and stand more erect, look ahead. Patient required mutiple cues to find chair on her right. Gait velocity: decreased    ADL: ADL Overall ADL's : Needs assistance/impaired Grooming: Wash/dry hands, Minimal assistance, Standing Grooming Details (indicate cue type and reason): to reach for soap dispenser with R hand due to difficulty with visual scanning Upper Body Bathing: Supervision/ safety, Sitting Upper Body Bathing Details (indicate cue type and reason): ' Lower Body Bathing: Moderate assistance, Sitting/lateral leans, Sit to/from stand Upper Body Dressing : Supervision/safety, Sitting Lower Body Dressing: Min guard, Sit to/from stand Lower Body Dressing Details (indicate cue type and reason): patient needing increased time to doff soiled briefs and don clean pair due to visual deficits, min G in standing for safety Toilet Transfer: Minimal assistance, Cueing for safety, Cueing for sequencing, Ambulation, Regular Toilet, RW, Grab bars Toilet Transfer Details (indicate cue type and reason): needs cues for proper hand placement especially on R side to hold proper place on walker frame due to difficulty visually scanning needing  multimodal cues. light min A to power up to standing from toilet. Toileting- Water quality scientist and Hygiene: Min guard, Sit to/from stand, Sitting/lateral lean Toileting - Clothing Manipulation Details (indicate cue type and reason): patient able to perform peri care after voiding in standing, min G for safety Functional mobility during ADLs: Minimal assistance, Cueing for safety, Cueing for sequencing, Rolling walker General ADL Comments: continues to have difficulty safely navigating her environment with walker due to visual deficits needing multimodal cues for scanning  Cognition: Cognition Overall Cognitive Status: Impaired/Different from baseline Arousal/Alertness: Awake/alert Orientation Level: Oriented to person Attention: Focused,  Sustained Focused Attention: Appears intact Sustained Attention: Appears intact Memory: Impaired Memory Impairment: Storage deficit, Retrieval deficit, Decreased long term memory, Decreased short term memory Decreased Long Term Memory: Verbal basic Decreased Short Term Memory: Verbal basic Awareness: Impaired Awareness Impairment: Intellectual impairment, Emergent impairment Problem Solving: Impaired Problem Solving Impairment: Verbal basic, Functional basic Safety/Judgment: Impaired Cognition Arousal/Alertness: Awake/alert Behavior During Therapy: WFL for tasks assessed/performed Overall Cognitive Status: Impaired/Different from baseline Area of Impairment: Orientation Orientation Level: Disoriented to, Place, Time Current Attention Level: Sustained Memory: Decreased short-term memory Following Commands: Follows one step commands with increased time Safety/Judgement: Decreased awareness of safety, Decreased awareness of deficits Awareness: Emergent Problem Solving: Requires verbal cues, Requires tactile cues General Comments: neighbor in to visit, repeated several times that her neighbor was visiting and if I had met her.  Physical  Exam: Blood pressure (!) 148/64, pulse 78, temperature 98 F (36.7 C), temperature source Oral, resp. rate 20, height _0  (1.626 m), weight 63.1 kg, SpO2 95 %. Physical Exam Vitals reviewed.  Constitutional:      Appearance: Normal appearance. She is normal weight.  HENT:     Head: Normocephalic and atraumatic.     Nose: Nose normal.  Eyes:     General:        Right eye: No discharge.        Left eye: No discharge.     Extraocular Movements: Extraocular movements intact.  Cardiovascular:     Rate and Rhythm: Normal rate and regular rhythm.  Pulmonary:     Effort: Pulmonary effort is normal. No respiratory distress.     Breath sounds: No stridor.  Abdominal:     General: Abdomen is flat. Bowel sounds are normal. There is no distension.  Musculoskeletal:     Cervical back: Normal range of motion and neck supple.     Comments: No edema or tenderness in extremities  Skin:    General: Skin is warm and dry.  Neurological:     Mental Status: She is alert.     Comments: Alert Makes eye contact with examiner.   Follows simple commands.   Provides name place and month but needed multiple cues for year and situation. Motor: RUE: 4+/5 proximal to distal RLE: 4/5 proximal to distal LUE/LLE: 5/5 proximal to distal  Psychiatric:        Mood and Affect: Mood normal.        Behavior: Behavior normal.        Thought Content: Thought content normal.    Results for orders placed or performed during the hospital encounter of 06/15/21 (from the past 48 hour(s))  CBC with Differential/Platelet     Status: Abnormal   Collection Time: 06/22/21 10:18 AM  Result Value Ref Range   WBC 7.1 4.0 - 10.5 K/uL   RBC 4.02 3.87 - 5.11 MIL/uL   Hemoglobin 11.6 (L) 12.0 - 15.0 g/dL   HCT 35.6 (L) 36.0 - 46.0 %   MCV 88.6 80.0 - 100.0 fL   MCH 28.9 26.0 - 34.0 pg   MCHC 32.6 30.0 - 36.0 g/dL   RDW 16.4 (H) 11.5 - 15.5 %   Platelets 298 150 - 400 K/uL   nRBC 0.0 0.0 - 0.2 %   Neutrophils Relative %  65 %   Neutro Abs 4.7 1.7 - 7.7 K/uL   Lymphocytes Relative 19 %   Lymphs Abs 1.4 0.7 - 4.0 K/uL   Monocytes Relative 10 %   Monocytes Absolute 0.7 0.1 - 1.0  K/uL   Eosinophils Relative 5 %   Eosinophils Absolute 0.3 0.0 - 0.5 K/uL   Basophils Relative 1 %   Basophils Absolute 0.0 0.0 - 0.1 K/uL   Immature Granulocytes 0 %   Abs Immature Granulocytes 0.02 0.00 - 0.07 K/uL    Comment: Performed at [facility], [place] [address] [place], [place] [address]  TSH     Status: Abnormal   Collection Time: 06/22/21 10:18 AM  Result Value Ref Range   TSH 8.036 (H) 0.350 - 4.500 uIU/mL    Comment: Performed by a 3rd Generation assay with a functional sensitivity of <=0.01 uIU/mL. Performed at [facility], [place] [address], [place], [place] [address]   Vitamin B12     Status: None   Collection Time: 06/22/21 10:18 AM  Result Value Ref Range   Vitamin B-12 526 180 - 914 pg/mL    Comment: (NOTE) This assay is not validated for testing neonatal or myeloproliferative syndrome specimens for Vitamin B12 levels. Performed at [facility], [place] [address], [place], [place] [address]   T4, free     Status: None   Collection Time: 06/22/21 10:18 AM  Result Value Ref Range   Free T4 0.99 0.61 - 1.12 ng/dL    Comment: (NOTE) Biotin ingestion may interfere with free T4 tests. If the results are inconsistent with the TSH level, previous test results, or the clinical presentation, then consider biotin interference. If needed, order repeat testing after stopping biotin. Performed at [facility], [place] [address], [place], [place] [address]   T3, free     Status: Abnormal   Collection Time: 06/22/21 10:18 AM  Result Value Ref Range   T3, Free 1.3 (L) 2.0 - 4.4 pg/mL    Comment: (NOTE) Performed At: [facility] [place], [place] [id number] Rush Farmer MD HW:[id number]   Urinalysis, Routine w reflex microscopic  Urine, Catheterized     Status: Abnormal   Collection Time: 06/22/21  1:34 PM  Result Value Ref Range   Color, Urine YELLOW YELLOW   APPearance HAZY (A) CLEAR   Specific Gravity, Urine 1.020 1.005 - 1.030   pH 6.0 5.0 - 8.0   Glucose, UA NEGATIVE NEGATIVE mg/dL   Hgb urine dipstick TRACE (A) NEGATIVE   Bilirubin Urine NEGATIVE NEGATIVE   Ketones, ur NEGATIVE NEGATIVE mg/dL   Protein, ur NEGATIVE NEGATIVE mg/dL   Nitrite POSITIVE (A) NEGATIVE   Leukocytes,Ua MODERATE (A) NEGATIVE   RBC / HPF 0-5 0 - 5 RBC/hpf   WBC, UA 21-50 0 - 5 WBC/hpf   Bacteria, UA MANY (A) NONE SEEN   Mucus PRESENT     Comment: Performed at [facility], [place] [address] [place], [place] [address]  Urine Culture     Status: Abnormal (Preliminary result)   Collection Time: 06/22/21  4:27 PM   Specimen: Urine, Catheterized  Result Value Ref Range   Specimen Description      URINE, CATHETERIZED Performed at [facility] [place] [address], [place], [place] [address]    Special Requests      NONE Performed at [facility], [place] [address], [place], [place] [address]    Culture (A)     >=100,000 COLONIES/mL GRAM NEGATIVE RODS SUSCEPTIBILITIES TO FOLLOW Performed at [facility], [place] [address], [place],  [address]    Report Status PENDING   Comprehensive metabolic panel  Status: Abnormal   Collection Time: 06/23/21  5:15 AM  Result Value Ref Range   Sodium 140 135 - 145 mmol/L   Potassium 3.8 3.5 - 5.1 mmol/L   Chloride 106 98 - 111 mmol/L   CO2 27 22 - 32 mmol/L   Glucose, Bld 92 70 - 99 mg/dL    Comment: Glucose reference range applies only to samples taken after fasting for at least 8 hours.   BUN 15 8 - 23 mg/dL   Creatinine, Ser 0.62 0.44 - 1.00 mg/dL   Calcium 8.6 (L) 8.9 - 10.3 mg/dL   Total Protein 5.9 (L) 6.5 - 8.1 g/dL   Albumin 3.2 (L) 3.5 - 5.0 g/dL   AST 27 15 - 41 U/L   ALT 23 0 - 44 U/L   Alkaline Phosphatase 80 38  - 126 U/L   Total Bilirubin 0.4 0.3 - 1.2 mg/dL   GFR, Estimated >60 >60 mL/min    Comment: (NOTE) Calculated using the CKD-EPI Creatinine Equation (2021)    Anion gap 7 5 - 15    Comment: Performed at [facility], [place] [address] [place], [place] [address]  CBC     Status: Abnormal   Collection Time: 06/23/21  5:15 AM  Result Value Ref Range   WBC 6.5 4.0 - 10.5 K/uL   RBC 4.04 3.87 - 5.11 MIL/uL   Hemoglobin 11.4 (L) 12.0 - 15.0 g/dL   HCT 35.4 (L) 36.0 - 46.0 %   MCV 87.6 80.0 - 100.0 fL   MCH 28.2 26.0 - 34.0 pg   MCHC 32.2 30.0 - 36.0 g/dL   RDW 16.5 (H) 11.5 - 15.5 %   Platelets 281 150 - 400 K/uL   nRBC 0.0 0.0 - 0.2 %    Comment: Performed at [facility], [place] [address] [place], [place] [address]  Magnesium     Status: None   Collection Time: 06/23/21  5:15 AM  Result Value Ref Range   Magnesium 2.2 1.7 - 2.4 mg/dL    Comment: Performed at [facility], [place] [address] [place], [place] [address]   CT HEAD WO CONTRAST (5MM)  Result Date: 06/22/2021 CLINICAL DATA:  Follow-up stroke EXAM: CT HEAD WITHOUT CONTRAST TECHNIQUE: Contiguous axial images were obtained from the base of the skull through the vertex without intravenous contrast. COMPARISON:  CT and MRI studies 06/18/2021 FINDINGS: Brain: No focal abnormality affects the brainstem or cerebellum. Acute infarction in the left PCA territory affecting the posteromedial temporal lobe, occipital lobe and left thalamus as seen previously, with low-density and swelling. No hemorrhage. Old ischemic changes of the thalami, basal ganglia and cerebral hemispheric white matter. No hydrocephalus or extra-axial collection. Vascular: There is atherosclerotic calcification of the major vessels at the base of the brain. Skull: Negative Sinuses/Orbits: Clear/normal Other: None IMPRESSION: Acute infarction affecting the left PCA territory as described above and as seen previously. No  new or progressive finding. No hemorrhagic transformation. Extensive small-vessel ischemic changes elsewhere affecting the thalami, basal ganglia and hemispheric white matter. Electronically Signed   By: Nelson Chimes M.D.   On: 06/22/2021 11:11       Medical Problem List and Plan: 1.  Decreased functional mobility with gait disturbance secondary to large acute left posterior cerebral artery infarction after robotic paraesophageal hernia repair, gastropexy, upper endoscopy 06/15/2021 as well as history of right thalamic CVA 2019  -patient may shower  -ELOS/Goals: 6-9 days/Supervision  Admit to CIR 2.  Antithrombotics: -DVT/anticoagulation:  Pharmaceutical: Lovenox  -antiplatelet therapy: Aspirin 81 mg daily and Plavix and 5 mg day x3 weeks then Plavix alone 3. Pain Management: Tylenol as needed 4. Mood history of cognitive impairment:   -antipsychotic agents: Seroquel 25 mg nightly 5. Neuropsych: This patient is not fully capable of making decisions on her own behalf. 6. Skin/Wound Care: Routine skin checks 7. Fluids/Electrolytes/Nutrition: Routine in and outs CMP ordered for tomorrow 8.  Hypertension.  Avapro 150 mg daily.   Monitor with increased mobility 9.  Hyperlipidemia.  Lipitor 80 10.  Gram-negative rod UTI.  Complete course of Rocephin 11.  Hypothyroidism.  Synthroid   Lavon Paganini Angiulli, PA-C 06/24/2021  I have personally performed a face to face diagnostic evaluation, including, but not limited to relevant history and physical exam findings, of this patient and developed relevant assessment and plan.  Additionally, I have reviewed and concur with the physician assistant's documentation above.  Delice Lesch, MD, ABPMR

## 2021-06-24 NOTE — Progress Notes (Signed)
Inpatient Rehab Admissions Coordinator:   I have a CIR bed for this Pt. Today. Transport is set up for 12:30 today. RN may call report to [phone].  Megan Salon, MS, CCC-SLP Rehab Admissions Coordinator  [phone] (celll) [phone] (office)

## 2021-06-24 NOTE — Discharge Instructions (Addendum)
Inpatient Rehab Discharge Instructions  Caroline [name] Discharge date and time: No discharge date for patient encounter.   Activities/Precautions/ Functional Status: Activity: activity as tolerated Diet: Regular Wound Care: Routine skin checks Functional status:  ___ No restrictions     ___ Walk up steps independently ___ 24/7 supervision/assistance   ___ Walk up steps with assistance ___ Intermittent supervision/assistance  ___ Bathe/dress independently ___ Walk with walker     _x__ Bathe/dress with assistance ___ Walk Independently    ___ Shower independently ___ Walk with assistance    ___ Shower with assistance ___ No alcohol     ___ Return to work/school ________  COMMUNITY REFERRALS UPON DISCHARGE:    Home Health:   PT     OT     ST                   Agency: [facility]  Phone:[phone]     Special Instructions: No driving smoking or alcohol  Aspirin 81 mg daily and Plavix 75 mg daily x7 more days then Plavix alone   My questions have been answered and I understand these instructions. I will adhere to these goals and the provided educational materials after my discharge from the hospital.  Patient/Caregiver Signature _______________________________ Date __________  Clinician Signature _______________________________________ Date __________  Please bring this form and your medication list with you to all your follow-up doctor's appointments.  STROKE/TIA DISCHARGE INSTRUCTIONS SMOKING Cigarette smoking nearly doubles your risk of having a stroke & is the single most alterable risk factor  If you smoke or have smoked in the last 12 months, you are advised to quit smoking for your health. Most of the excess cardiovascular risk related to smoking disappears within a year of stopping. Ask you doctor about anti-smoking medications [place] Quit Line: 1-800-QUIT NOW Free Smoking Cessation Classes [phone]  CHOLESTEROL Know your levels; limit fat & cholesterol in your diet  Lipid  Panel     Component Value Date/Time   CHOL 216 (H) 06/19/2021 0952   CHOL 237 (H) 11/24/2020 1139   TRIG 88 06/19/2021 0952   HDL 75 06/19/2021 0952   HDL 68 11/24/2020 1139   CHOLHDL 2.9 06/19/2021 0952   VLDL 18 06/19/2021 0952   LDLCALC 123 (H) 06/19/2021 0952   LDLCALC 148 (H) 11/24/2020 1139     Many patients benefit from treatment even if their cholesterol is at goal. Goal: Total Cholesterol (CHOL) less than 160 Goal:  Triglycerides (TRIG) less than 150 Goal:  HDL greater than 40 Goal:  LDL (LDLCALC) less than 100   BLOOD PRESSURE American Stroke Association blood pressure target is less that 120/80 mm/Hg  Your discharge blood pressure is:    Monitor your blood pressure Limit your salt and alcohol intake Many individuals will require more than one medication for high blood pressure  DIABETES (A1c is a blood sugar average for last 3 months) Goal HGBA1c is under 7% (HBGA1c is blood sugar average for last 3 months)  Diabetes: No known diagnosis of diabetes    Lab Results  Component Value Date   HGBA1C 5.5 06/19/2021    Your HGBA1c can be lowered with medications, healthy diet, and exercise. Check your blood sugar as directed by your physician Call your physician if you experience unexplained or low blood sugars.  PHYSICAL ACTIVITY/REHABILITATION Goal is 30 minutes at least 4 days per week  Activity: Increase activity slowly, Therapies: Physical Therapy: Home Health Return to work:  Activity decreases your risk of heart  attack and stroke and makes your heart stronger.  It helps control your weight and blood pressure; helps you relax and can improve your mood. Participate in a regular exercise program. Talk with your doctor about the best form of exercise for you (dancing, walking, swimming, cycling).  DIET/WEIGHT Goal is to maintain a healthy weight  Your discharge diet is:  Diet Order             DIET - DYS 1 Room service appropriate? Yes; Fluid consistency: Thin   Diet effective now                   liquids Your height is:    Your current weight is:   Your Body Mass Index (BMI) is:    Following the type of diet specifically designed for you will help prevent another stroke. Your goal weight range is:   Your goal Body Mass Index (BMI) is 19-24. Healthy food habits can help reduce 3 risk factors for stroke:  High cholesterol, hypertension, and excess weight.  RESOURCES Stroke/Support Group:  Call [phone]   STROKE EDUCATION PROVIDED/REVIEWED AND GIVEN TO PATIENT Stroke warning signs and symptoms How to activate emergency medical system (call 911). Medications prescribed at discharge. Need for follow-up after discharge. Personal risk factors for stroke. Pneumonia vaccine given: No Flu vaccine given: No My questions have been answered, the writing is legible, and I understand these instructions.  I will adhere to these goals & educational materials that have been provided to me after my discharge from the hospital.

## 2021-06-24 NOTE — Progress Notes (Signed)
PROGRESS NOTE    Caroline [name]  WUJ:[id number] DOB: [birth year] DOA: 06/15/2021 PCP: Gaspar Garbe, MD   Chief Complain: Altered mental status  Brief Narrative:  Patient is a 85 year old female with history of prior CVA, mild cognitive impairment, symptomatic paraesophageal hernia followed by GI/general surgery, GERD, hypertension, hyperlipidemia who was admitted here on 9/12 on general surgery service for the repair of paraesophageal hernia.  She underwent surgery on 9/12 with robotic paraesophageal hernia repair, gastropexy and upper endoscopy.  Post surgery, she developed confusion, worsening of mental status.  Brain imaging showed acute large left PCA territory infarct without hemorrhage/mass-effect.  Neurology was consulted and following.  Recommend aspirin Plavix for 21 days followed by Plavix alone.  We were consulted because patient had persistent confusion, increased sleepiness.  Mental status has improved and she is alert and awake. eventual plan is to discharge to CIR. patient can be discharged to CIR whenever possible.  Urine culture can be followed at CIR  Assessment & Plan:   Active Problems:   S/P repair of paraesophageal hernia   Delirium due to multiple etiologies   Acute metabolic encephalopathy in the setting of recent CVA/UTI: Patient has mild cognitive issues at baseline.  She was found to have acute CVA during this hospitalization. Hospital acquired delirium needs also possibility.. At baseline, she is functional and was driving before this admission. Work-up was proceeded.  Urinalysis showed questionable evidence of UTI with moderate leukocytes, positive nitrates, bacteria.  Urine cultures showed gram-negative rods, sensitivity pending.  Her previous urine culture showed E. coli sensitive to cephalosporin.  Currently on ceftriaxone. Follow-up CT imaging did not show any Acute intracranial abnormalities.  Vitamin B12 slightly on the lower side, currently being  supplemented. She is on low-dose Seroquel at bedtime. Her mental status has improved.  She is not sleepy, she is ambulating with assistance, follows commands, communicates well, knows that she is in the hospital but not oriented to time  Urinary tract infection: Urine culture now showing significant gram-negative rods.  Please follow-up urine culture at CIR.  If it shows pansensitive E. coli, total 5 days course of antibiotics will be sufficient.  Recent NFA:OZHYQ imaging showed acute large left PCA territory infarct without hemorrhage/mass-effect.  Neurology was consulted and were following.  Recommend aspirin  and Plavix for 21 days followed by Plavix alone.  Looks like she does not have any focal neurological deficits.  She was ambulating to her bathroom with assistance  Hypothyroidism: TSH elevated at 8. T3 of 1.3.  Picture consistent with euthyroid sick syndrome.  Could be associated with acute/critical illness.  However,we  have recommended increment in the Synthyroid dose.  Follow-up TSH in 3 to 4 weeks.  Hypertension: Monitor blood pressure.  Continue current medications  Hyperlipidemia: Continue statin  Disposition/deconditioning/debility: PT/OT recommending inpatient rehab.         Procedures:As above  Antimicrobials:  Anti-infectives (From admission, onward)    Start     Dose/Rate Route Frequency Ordered Stop   06/23/21 1300  cefTRIAXone (ROCEPHIN) 1 g in sodium chloride 0.9 % 100 mL IVPB        1 g 200 mL/hr over 30 Minutes Intravenous Every 24 hours 06/23/21 1152 06/26/21 1259   06/22/21 1800  ciprofloxacin (CIPRO) IVPB 200 mg  Status:  Discontinued        200 mg 100 mL/hr over 60 Minutes Intravenous Every 12 hours 06/22/21 1630 06/23/21 1152   06/15/21 0600  ceFAZolin (ANCEF) IVPB 2g/100 mL premix  2 g 200 mL/hr over 30 Minutes Intravenous On call to O.R. 06/15/21 1610 06/15/21 0755       Subjective:  Patient seen and examined at the bedside this morning.   Hemodynamically stable.  Husband at the bedside.  She is alert and awake and communicates very well, obeys commands but still remains confused in terms of time and place.  Not in any kind of distress.  Comfortable.  Objective: Vitals:   06/23/21 0519 06/23/21 1131 06/23/21 2100 06/24/21 0351  BP: (!) 130/53 133/61 (!) 117/53 (!) 148/64  Pulse: 75 79 81 78  Resp: 20  20 20   Temp: 98.1 F (36.7 C) 98.4 F (36.9 C) 98 F (36.7 C) 98 F (36.7 C)  TempSrc: Oral Oral Oral Oral  SpO2: 94% 95% 95% 95%  Weight:      Height:        Intake/Output Summary (Last 24 hours) at 06/24/2021 0817 Last data filed at 06/24/2021 0430 Gross per 24 hour  Intake 100 ml  Output 201 ml  Net -101 ml   Filed Weights   06/15/21 0550 06/17/21 1930  Weight: 62.1 kg 63.1 kg    Examination:  General exam: Overall comfortable, not in distress, pleasantly confused elderly female HEENT: PERRL Respiratory system:  no wheezes or crackles  Cardiovascular system: S1 & S2 heard, RRR.  Gastrointestinal system: Abdomen is nondistended, soft and nontender. Central nervous system: Alert and awake but not oriented Extremities: No edema, no clubbing ,no cyanosis Skin: No rashes, no ulcers,no icterus      Data Reviewed: I have personally reviewed following labs and imaging studies  CBC: Recent Labs  Lab 06/22/21 1018 06/23/21 0515  WBC 7.1 6.5  NEUTROABS 4.7  --   HGB 11.6* 11.4*  HCT 35.6* 35.4*  MCV 88.6 87.6  PLT 298 281   Basic Metabolic Panel: Recent Labs  Lab 06/23/21 0515  NA 140  K 3.8  CL 106  CO2 27  GLUCOSE 92  BUN 15  CREATININE 0.62  CALCIUM 8.6*  MG 2.2   GFR: Estimated Creatinine Clearance: 46 mL/min (by C-G formula based on SCr of 0.62 mg/dL). Liver Function Tests: Recent Labs  Lab 06/23/21 0515  AST 27  ALT 23  ALKPHOS 80  BILITOT 0.4  PROT 5.9*  ALBUMIN 3.2*   No results for input(s): LIPASE, AMYLASE in the last 168 hours. No results for input(s): AMMONIA in the  last 168 hours. Coagulation Profile: No results for input(s): INR, PROTIME in the last 168 hours. Cardiac Enzymes: No results for input(s): CKTOTAL, CKMB, CKMBINDEX, TROPONINI in the last 168 hours. BNP (last 3 results) No results for input(s): PROBNP in the last 8760 hours. HbA1C: No results for input(s): HGBA1C in the last 72 hours. CBG: No results for input(s): GLUCAP in the last 168 hours. Lipid Profile: No results for input(s): CHOL, HDL, LDLCALC, TRIG, CHOLHDL, LDLDIRECT in the last 72 hours. Thyroid Function Tests: Recent Labs    06/22/21 1018  TSH 8.036*  FREET4 0.99  T3FREE 1.3*   Anemia Panel: Recent Labs    06/22/21 1018  VITAMINB12 526   Sepsis Labs: No results for input(s): PROCALCITON, LATICACIDVEN in the last 168 hours.  No results found for this or any previous visit (from the past 240 hour(s)).       Radiology Studies: CT HEAD WO CONTRAST (06/24/21)  Result Date: 06/22/2021 CLINICAL DATA:  Follow-up stroke EXAM: CT HEAD WITHOUT CONTRAST TECHNIQUE: Contiguous axial images were obtained from the base  of the skull through the vertex without intravenous contrast. COMPARISON:  CT and MRI studies 06/18/2021 FINDINGS: Brain: No focal abnormality affects the brainstem or cerebellum. Acute infarction in the left PCA territory affecting the posteromedial temporal lobe, occipital lobe and left thalamus as seen previously, with low-density and swelling. No hemorrhage. Old ischemic changes of the thalami, basal ganglia and cerebral hemispheric white matter. No hydrocephalus or extra-axial collection. Vascular: There is atherosclerotic calcification of the major vessels at the base of the brain. Skull: Negative Sinuses/Orbits: Clear/normal Other: None IMPRESSION: Acute infarction affecting the left PCA territory as described above and as seen previously. No new or progressive finding. No hemorrhagic transformation. Extensive small-vessel ischemic changes elsewhere affecting the  thalami, basal ganglia and hemispheric white matter. Electronically Signed   By: Paulina Fusi M.D.   On: 06/22/2021 11:11        Scheduled Meds:  aspirin EC  81 mg Oral Daily   atorvastatin  80 mg Oral QHS   clopidogrel  75 mg Oral Daily   docusate sodium  100 mg Oral BID   enoxaparin (LOVENOX) injection  40 mg Subcutaneous Q24H   feeding supplement  1 Container Oral TID BM   hydrocortisone cream   Topical BID   irbesartan  150 mg Oral Daily   levothyroxine  125 mcg Oral Q0600   nystatin   Topical BID   pantoprazole  40 mg Oral QHS   QUEtiapine  25 mg Oral QHS   vitamin B-12  250 mcg Oral Daily   Continuous Infusions:  cefTRIAXone (ROCEPHIN)  IV Stopped (06/23/21 1557)     LOS: 7 days    Time spent: 35 mins,.More than 50% of that time was spent in counseling and/or coordination of care.      Burnadette Pop, MD [facility] P9/21/2022, 8:17 AM

## 2021-06-24 NOTE — Progress Notes (Signed)
Inpatient Rehabilitation Medication Review by a Pharmacist  A complete drug regimen review was completed for this patient to identify any potential clinically significant medication issues.  High Risk Drug Classes Is patient taking? Indication by Medication  Antipsychotic Yes Quetipiane 25 mg qhs for  mood stabilization  Anticoagulant Yes Enoxaparin 40 mg SQ q24h for VTE prophylaxis  Antibiotic Yes Ceftriaxone for UTI  Opioid No   Antiplatelet Yes Aspirin 81 mg + Plavix 75 mg daily.  Hypoglycemics/insulin No   Vasoactive Medication Yes Irbesartan 150 mg PO daily  Chemotherapy No   Other No      Type of Medication Issue Identified Description of Issue Recommendation(s)  No Medication Administration End Date  Aspirin 81 mg daily begun 06/19/21 with plan for 3 weeks of therapy. Add stop time prior to discharge.  Last dose should be on 07/10/21.  Incorrect Dose  Protonix 40 mg qhs continued, but to resume PTA BID dosing per dc summary Increased to BID per discharge plan.  Additional Drug Therapy Needed  Ceftriaxone 1gm IV q24h for UTI begun 06/23/21, no order at transfer. Resuming this afternoon and to continue for 4 more days.  Significant med changes from prior encounter (inform family/care partners about these prior to discharge). Synthroid dose was increased from 112 to 125 mcg daily on 9/20 due to elevated TSH of 8.  Atorvastatin dose increased from 40 to 80 mg qhs on 9/17 (LDL 123 on 06/19/21) Hospitalist recommended rechecking TSH on 3-4 weeks  Other  Several PTA meds ordered to resume at discharge, but okay off for now:  Vitamin D, Vitamin C, Fish Oil, Co-Q10, HCTZ 25 mg daily prn fluid, Potassium 99 mg daily, and Simethicone 125 mg q8hr prn flatulence.     Clinically significant medication issues were identified that warrant physician communication and completion of prescribed/recommended actions by midnight of the next day:  Yes  Name of provider notified for urgent issues  identified: Deatra Ina, PA-C  Provider Method of Notification:  secure chat  Pharmacist comments:  D. Angiulli, PA-C reports pending Ceftriaxone to continue on transfer, but length of therapy was extended from 3 days to 5 days after orders had been signed and held, which seems to have cancelled Ceftriaxone order.  Now resuming for 4 more days, beginning today.  Last dose 9/20 at 3:15pm.  > 100 K/ml gram negative rod in 9/19 urine culture.  Final report pending.   Time spent performing this drug regimen review (minutes):  20    Dennie Fetters, [place] 06/24/2021 2:51 PM

## 2021-06-24 NOTE — Discharge Summary (Signed)
Physician Discharge Summary  Patient ID: Caroline [name] MRN: [id number] DOB/AGE: 01/05/1936 85 y.o.  Admit date: 06/15/2021 Discharge date: 06/24/2021  Admission Diagnoses: Paraesophageal hernia  Discharge Diagnoses:  Active Problems:   S/P repair of paraesophageal hernia   Delirium due to multiple etiologies   Benign essential HTN   Dyslipidemia Ischemic stroke  Discharged Condition: good  Hospital Course: Caroline [name] was admitted for an elective robotic paraesophageal hernia repair with gastropexy which was performed on 06/15/2021 and which was uneventful.  Postoperatively she was noted to have some subcutaneous emphysema along the right side of her face which resolved quickly without intervention.   The patient had some confusion overnight and on postop day 1 was noted to have some lethargy and ongoing confusion.  Narcotics and any mental state altering medications were discontinued and delirium precautions were initiated.  Her family had noted that she had had some cognitive decline and forgetfulness prior to surgery and it was presumed that she had exacerbation of this and associated postop delirium which was anticipated to improve with time.  She was reexamined that afternoon and noted to have some improvement in her confusion.  She did have a headache earlier in the day which was resolved with Tylenol.  Seroquel was initiated for confusion overnight.   On postop day 2, the patient had some ongoing confusion and somnolence.  On postop day 3 (9/15), the patient seemed much improved with improved confusion and did not have any overt neurological deficits.  Arrangements were made for the patient to go home with 24-hour home health assistance.  Her daughter noted that she seemed to have a right-sided visual field deficit (did not seem to see the right side of her plate when eating) and was having trouble with her fine motor skills.  At this point a CT scan was performed and demonstrated a PCA  territory ischemic stroke.  Neurology was consulted and further work-up including MRI/MRA and echocardiogram, lipid panel, hemoglobin A1c were performed without significant additional findings.  Recommendations were made for telemetry, addition of aspirin to her Plavix, increase in Lipitor dose, and outpatient follow-up with her neurologist.  The transitions of care team and CIR services were consulted and over the next few days, the patient continue to work with therapies while authorization was sought to transfer the patient to CIR. On the morning of 9/26, the patient's other daughter who is at the bedside noted concern that she was more somnolent than she had been through the preceding weekend.  A CT scan was repeated which did not show any new findings.  Neurology was reconsulted and they ordered a B12, thyroid and urinalysis which did demonstrated an abnormally elevated TSH, normal B12 level, and a positive urinalysis in the setting of a patient with a history of asymptomatic bacteriuria.  At this point the hospitalist were consulted and a small titration in her levothyroxine was made and antibiotics initiated.  The patient to continued to work with therapies and remained stable for discharge.  Throughout this time, the patient has been tolerating a pured diet without any dysphagia, nausea, reflux or abdominal pain.   She will need to follow-up with her neurologist, Dr. [name], as well as her primary care provider. She is being discharged to CIR where she will need to complete her 3-day course of Rocephin (ending 9/22) and will need to remain on a pured diet until 9/26 at which time she can advance to a soft diet, please see discharge instructions in the AVS.  I gave her husband a copy of these as well.  She will need to follow-up with me in about 2 weeks.  Consults: neurology, rehabilitation medicine, and internal medicine  Significant Diagnostic Studies: Imaging and labs as noted  above  Treatments: Robotic paraesophageal hernia repair with gastropexy 06/15/2021  Discharge Exam: Blood pressure (!) 148/64, pulse 78, temperature 98 F (36.7 C), temperature source Oral, resp. rate 20, height 5\' 4"  (1.626 m), weight 63.1 kg, SpO2 95 %. She is alert, pleasant, no distress Speech is clear and fluent, answering most questions appropriately Unlabored respiration Abdomen is soft, nontender, incisions healing well without issue No lower extremity edema Extremities warm and well perfused, strength 5 out of 5 and symmetrical throughout  Disposition: Discharge disposition: [facility]-Another [facility] Care Institution Not Defined       Discharge Instructions     Ambulatory referral to Neurology   Complete by: As directed    An appointment is requested in approximately: 3-4 weeks. Post hospital for stroke 9/22.      Allergies as of 06/24/2021       Reactions   Gluten Meal Nausea And Vomiting   Patient has CELIAC DISEASE   Wheat Bran Nausea And Vomiting   Patient has CELIAC DISEASE   Adhesive [tape] Other (See Comments)   Patient's skin is VERY THIN and tears very easily; please use either paper tape or Coban wrap   Codeine Nausea And Vomiting   Other Other (See Comments)   Patient has Hypoglycemia; her husband stated "when her sugar crashes, it crashes hard."   Penicillins Other (See Comments)   Doesn't remember reaction , but denies any SOB/Swelling States " it was a long time ago "         Medication List     STOP taking these medications    traMADol 50 MG tablet Commonly known as: ULTRAM       TAKE these medications    acetaminophen 650 MG CR tablet Commonly known as: TYLENOL Take 1,300 mg by mouth every 8 (eight) hours as needed for pain.   aspirin 81 MG EC tablet Take 1 tablet (81 mg total) by mouth daily for 21 days. Swallow whole.   atorvastatin 80 MG tablet Commonly known as: LIPITOR Take 1 tablet (80 mg total) by mouth at bedtime. What  changed:  medication strength how much to take when to take this   clopidogrel 75 MG tablet Commonly known as: PLAVIX Take 75 mg by mouth daily.   COQ10 PO Take 1 tablet by mouth daily.   FISH OIL PO Take 1,000 mg by mouth daily.   hydrochlorothiazide 12.5 MG capsule Commonly known as: MICROZIDE Take 12.5 mg by mouth daily as needed (fluid).   hydrocortisone cream 1 % Apply topically 2 (two) times daily.   irbesartan 150 MG tablet Commonly known as: AVAPRO Take 150 mg by mouth daily.   levothyroxine 125 MCG tablet Commonly known as: SYNTHROID Take 1 tablet (125 mcg total) by mouth daily. Follow up with PCP to titrate What changed:  medication strength how much to take additional instructions   nystatin powder Commonly known as: MYCOSTATIN/NYSTOP Apply topically 2 (two) times daily.   ondansetron 4 MG disintegrating tablet Commonly known as: ZOFRAN-ODT Take 4 mg by mouth every 8 (eight) hours as needed for nausea or vomiting.   pantoprazole 40 MG tablet Commonly known as: PROTONIX TAKE 1 TABLET BY MOUTH 2 TIMES DAILY BEFORE A MEAL. What changed: See the new instructions.   Potassium  99 MG Tabs Take 99 mg by mouth daily.   simethicone 125 MG chewable tablet Commonly known as: MYLICON Chew 125 mg by mouth every 6 (six) hours as needed for flatulence.   vitamin B-12 250 MCG tablet Commonly known as: CYANOCOBALAMIN Take 1 tablet (250 mcg total) by mouth daily.   vitamin C 100 MG tablet Take 100 mg by mouth every other day.   Vitamin D-3 25 MCG (1000 UT) Caps Take 1,000 Units by mouth daily.       ASK your doctor about these medications    QUEtiapine 25 MG tablet Commonly known as: SEROQUEL Take 1 tablet (25 mg total) by mouth at bedtime for 5 days. Ask about: Should I take this medication?        Follow-up Information     Berna Bue, MD Follow up in 3 week(s).   Specialty: General Surgery Contact information: [address] [place] [address] [phone]         Tisovec, Adelfa Koh, MD Follow up in 1 week(s).   Specialty: Internal Medicine Why: Please f/u with PCP regarding BP management and update reagrding recent surgery and stroke, possible need for loop recorder Contact information: [address] [place] [address] [phone]         Micki Riley, MD Follow up in 2 day(s).   Specialties: Neurology, Radiology Why: Further follow up re: stroke Contact information: [address] [place] [address] [phone]                 Signed: Berna Bue 06/24/2021, 1:07 PM

## 2021-06-24 NOTE — Progress Notes (Signed)
RN called report to [facility] CIR. Will leave pts IV in for transport. [facility] set up to transport pt at 1230.

## 2021-06-24 NOTE — Progress Notes (Addendum)
PMR Admission Coordinator Pre-Admission Assessment   Patient: Caroline [name] is an 85 y.o., female MRN: [id number] DOB: Sep 25, 1936 Height: $RemoveBefo'5\' 4"'WRyVuSMAReF$  (162.6 cm) Weight: 63.1 kg   Insurance Information HMO: yes    PPO:      PCP:      IPA:      80/20:      OTHER:  PRIMARY: [employer] Medicare      Policy#: [id number]      Subscriber: patient CM Name:       Phone#: [phone]     Fax#: [phone] Pre-Cert#: [id number]      Employer:  Sharyn Lull with [employer] called with approval for admission 9/20-2/27 with updates due 06/30/21. Benefits:  Phone #: [email or web address]     Name:  Eff. Date: 10/05/19-present     Deduct: $0 (does not have deductible)       Out of Pocket Max: $4,500 [phone] met)   Life Max: NA CIR: $325/day co-pay days 1-5, 100% coverage days 6+      SNF: 100% coverage days 1-20, $188/day co-pay days 21-44, 100% coverage days 45-100; limited to 100 days/cal year Outpatient: $30/visit co-pay     Co-Pay:  Home Health: 100% coverage      Co-Pay:  DME: 80% coverage     Co-Pay: 20% co-insurance Providers: in-network SECONDARY:       Policy#:      Phone#:    Development worker, community:       Phone#:    The Engineer, petroleum" for patients in Inpatient Rehabilitation Facilities with attached "Privacy Act [place] Records" was provided and verbally reviewed with: Patient   Emergency Contact Information Contact Information       Name Relation Home Work Mobile    Rohe,[name] Spouse [phone]   [phone]    Darcie, Mellone Daughter     [phone]           Current Medical History  Patient Admitting Diagnosis: Left PCA stroke History of Present Illness: Pt is a 85 year old female with medical hx significant for: DVT, GERD, HTN, CVA, scoliosis. Pt has been followed for symptomatic paraesophageal hernia by Dr. Loletha Carrow. She has been encouraged to pursue surgical repair of her hiatal hernia for over a year but has been reluctant to do so given her age. On 9/12, pt  presented to hospital for robotic paraesophageal hernia repair with gastropexy.Complete NIHSS TOTAL: 0After reported difficulty with ADLs, CT head, on 9/15, revealed large acute PCA territory infarct as well as infarcts in the left thalamus, and left cerebellar hemisphere. MRI brain, MRA head and neck showed large acute left PCA territory infarct without hemorrhage or mass effect. Echo showed no source for embolic stroke. Therapy evaluations completed and CIR recommended d/t pt's deficits in functional mobility and ability to complete ADLs independently.    Patient's medical record from [facility] has been reviewed by the rehabilitation admission coordinator and physician.   Past Medical History      Past Medical History:  Diagnosis Date   Allergy     Anxiety     Arthritis     Cataract     Celiac sprue     CVA (cerebral vascular accident) ([place]) 02/2018    R thalamic CVA   DVT (deep vein thrombosis) in pregnancy     GERD (gastroesophageal reflux disease)     Glaucoma     Hormone replacement therapy (HRT)     Hypercholesteremia     Hyperlipidemia     Hypertension  Hypoglycemia     Hypothyroidism     OAB (overactive bladder)     Osteoporosis     PONV (postoperative nausea and vomiting)     Raynaud's disease     Scoliosis     Seasonal allergies     Senile purpura ([place])     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 Yes   Family History   family history includes Arthritis in her mother; Cancer in her maternal uncle; Hypertension in her mother; Obesity in her  sister.   Current Medications   Current Facility-Administered Medications:    acetaminophen (TYLENOL) tablet 650 mg, 650 mg, Oral, Q6H PRN, Romana Juniper A, MD, 650 mg at 06/23/21 1052   aspirin EC tablet 81 mg, 81 mg, Oral, Daily, Bhagat, Srishti L, MD, 81 mg at 06/23/21 0853   atorvastatin (LIPITOR) tablet 80 mg, 80 mg, Oral, QHS, Bhagat, Srishti L, MD, 80 mg at 06/23/21 2131   bisacodyl (DULCOLAX) suppository 10 mg, 10 mg, Rectal, Daily PRN, Romana Juniper A, MD   cefTRIAXone (ROCEPHIN) 1 g in sodium chloride 0.9 % 100 mL IVPB, 1 g, Intravenous, Q24H, Adhikari, Amrit, MD, Stopped at 06/23/21 1557   clopidogrel (PLAVIX) tablet 75 mg, 75 mg, Oral, Daily, Romana Juniper A, MD, 75 mg at 06/23/21 0853   docusate sodium (COLACE) capsule 100 mg, 100 mg, Oral, BID, Kae Heller, Chelsea A, MD, 100 mg at 06/23/21 2131   enoxaparin (LOVENOX) injection 40 mg, 40 mg, Subcutaneous, Q24H, Kae Heller, Chelsea A, MD, 40 mg at 06/23/21 0844   feeding supplement (BOOST / RESOURCE BREEZE) liquid 1 Container, 1 Container, Oral, TID BM, Clovis Riley, MD, 1 Container at 06/23/21 2000   hydrALAZINE (APRESOLINE) injection 10 mg, 10 mg, Intravenous, Q2H PRN, Romana Juniper A, MD, 10 mg at 06/19/21 2327   hydrocortisone cream 1 %, , Topical, BID, Clovis Riley, MD, Given at 06/23/21 2131   irbesartan (AVAPRO) tablet 150 mg, 150 mg, Oral, Daily, Romana Juniper A, MD, 150 mg at 06/23/21 3149   levothyroxine (SYNTHROID) tablet 125 mcg, 125 mcg, Oral, Q0600, Gardiner Barefoot, NP, 125 mcg at 06/24/21 0538   metoprolol tartrate (LOPRESSOR) injection 5 mg, 5 mg, Intravenous, Q6H PRN, Clovis Riley, MD   nystatin (MYCOSTATIN/NYSTOP) topical powder, , Topical, BID, Romana Juniper A, MD, Given at 06/23/21 2131   ondansetron (ZOFRAN-ODT) disintegrating tablet 4 mg, 4 mg, Oral, Q6H PRN **OR** ondansetron (ZOFRAN) injection 4 mg, 4 mg, Intravenous, Q6H PRN, Kae Heller, Chelsea A, MD   pantoprazole (PROTONIX) EC tablet  40 mg, 40 mg, Oral, QHS, Connor, Chelsea A, MD, 40 mg at 06/23/21 2131   polyvinyl alcohol (LIQUIFILM TEARS) 1.4 % ophthalmic solution 1 drop, 1 drop, Right Eye, PRN, Romana Juniper A, MD, 1 drop at 06/23/21 1053   QUEtiapine (SEROQUEL) tablet 25 mg, 25 mg, Oral, QHS, Connor, Chelsea A, MD, 25 mg at 06/23/21 2131   vitamin B-12 (CYANOCOBALAMIN) tablet 250 mcg, 250 mcg, Oral, Daily, Kirby-Graham, Santiago Glad  J, NP, 250 mcg at 06/23/21 0854   Patients Current Diet:  Diet Order                  DIET - DYS 1 Room service appropriate? Yes; Fluid consistency: Thin  Diet effective now                         Precautions / Restrictions Precautions Precautions: Fall Precaution Comments: visual neglect R side Restrictions Weight Bearing Restrictions: No    Has the patient had 2 or more falls or a fall with injury in the past year? No   Prior Activity Level Community (5-7x/wk): driving,working   Prior Functional Level Self Care: Did the patient need help bathing, dressing, using the toilet or eating? Independent   Indoor Mobility: Did the patient need assistance with walking from room to room (with or without device)? Independent   Stairs: Did the patient need assistance with internal or external stairs (with or without device)? Independent   Functional Cognition: Did the patient need help planning regular tasks such as shopping or remembering to take medications? Independent   Patient Information Are you of Hispanic, Latino/a,or Spanish origin?: A. No, not of Hispanic, Latino/a, or Spanish origin What is your race?: A. White Do you need or want an interpreter to communicate with a doctor or health care staff?: 0. No   Patient's Response To:  Health Literacy and Transportation Is the patient able to respond to health literacy and transportation needs?: Yes Health Literacy - How often do you need to have someone help you when you read instructions, pamphlets, or other written material  from your doctor or pharmacy?: Never In the past 12 months, has lack of transportation kept you from medical appointments or from getting medications?: No In the past 12 months, has lack of transportation kept you from meetings, work, or from getting things needed for daily living?: No   Home Assistive Devices / [place] Devices/Equipment: Eyeglasses, Blood pressure cuff, Cane (specify quad or straight), Walker (specify type), Grab bars in shower Home Equipment: Walker - 2 wheels, Cane - single point   Prior Device Use: Indicate devices/aids used by the patient prior to current illness, exacerbation or injury? Walker and cane   Current Functional Level Cognition   Arousal/Alertness: Awake/alert Overall Cognitive Status: Impaired/Different from baseline Current Attention Level: Sustained Orientation Level: Oriented to person Following Commands: Follows one step commands with increased time Safety/Judgement: Decreased awareness of safety, Decreased awareness of deficits General Comments: neighbor in to visit, repeated several times that her neighbor was visiting and if I had met her. Attention: Focused, Sustained Focused Attention: Appears intact Sustained Attention: Appears intact Memory: Impaired Memory Impairment: Storage deficit, Retrieval deficit, Decreased long term memory, Decreased short term memory Decreased Long Term Memory: Verbal basic Decreased Short Term Memory: Verbal basic Awareness: Impaired Awareness Impairment: Intellectual impairment, Emergent impairment Problem Solving: Impaired Problem Solving Impairment: Verbal basic, Functional basic Safety/Judgment: Impaired    Extremity Assessment (includes Sensation/Coordination)   Upper Extremity Assessment: Generalized weakness  Lower Extremity Assessment: Defer to PT evaluation     ADLs   Overall ADL's : Needs assistance/impaired Grooming: Wash/dry hands, Minimal assistance, Standing Grooming Details  (indicate cue type and reason): to reach for soap dispenser with R hand due to difficulty with visual scanning Upper Body Bathing: Supervision/ safety, Sitting Upper Body Bathing Details (indicate cue type and reason): ' Lower Body Bathing: Moderate assistance, Sitting/lateral leans, Sit to/from  stand Upper Body Dressing : Supervision/safety, Sitting Lower Body Dressing: Min guard, Sit to/from stand Lower Body Dressing Details (indicate cue type and reason): patient needing increased time to doff soiled briefs and don clean pair due to visual deficits, min G in standing for safety Toilet Transfer: Minimal assistance, Cueing for safety, Cueing for sequencing, Ambulation, Regular Toilet, RW, Grab bars Toilet Transfer Details (indicate cue type and reason): needs cues for proper hand placement especially on R side to hold proper place on walker frame due to difficulty visually scanning needing multimodal cues. light min A to power up to standing from toilet. Toileting- Water quality scientist and Hygiene: Min guard, Sit to/from stand, Sitting/lateral lean Toileting - Clothing Manipulation Details (indicate cue type and reason): patient able to perform peri care after voiding in standing, min G for safety Functional mobility during ADLs: Minimal assistance, Cueing for safety, Cueing for sequencing, Rolling walker General ADL Comments: continues to have difficulty safely navigating her environment with walker due to visual deficits needing multimodal cues for scanning     Mobility   Overal bed mobility: Needs Assistance Bed Mobility: Supine to Sit Supine to sit: Supervision Sit to supine: Mod assist General bed mobility comments: extra time, cues to initiate activity and cues  to complete, pt asks  multiplee times what she should be doing.     Transfers   Overall transfer level: Needs assistance Equipment used: Rolling walker (2 wheeled) Transfers: Sit to/from Stand Sit to Stand: Min assist Stand  pivot transfers: Min guard General transfer comment: light min A to power up to standing from bed and  toilet. cues to use rail. Posture appears more flexed today. noted significant scoliosis     Ambulation / Gait / Stairs / Wheelchair Mobility   Ambulation/Gait Ambulation/Gait assistance: Herbalist (Feet): 120 Feet Assistive device: Rolling walker (2 wheeled) Gait Pattern/deviations: Step-to pattern, Trunk flexed General Gait Details: constant cues for right visual deficits, assist with turning RW, keeping in straight direction. Frequent cues to catch up to RW and stand more erect, look ahead. Patient required mutiple cues to find chair on her right. Gait velocity: decreased     Posture / Balance Dynamic Sitting Balance Sitting balance - Comments: improved, able to cross over each leg, did not lean Balance Overall balance assessment: Needs assistance Sitting-balance support: Feet supported Sitting balance-Leahy Scale: Good Sitting balance - Comments: improved, able to cross over each leg, did not lean Standing balance support: Single extremity supported, During functional activity Standing balance-Leahy Scale: Fair Standing balance comment: standing from toilet to  wipe post BM. and wash hands at sink     Special needs/care consideration Skin Ecchymosis: abdomen/left; Surgical incision: abdomen, 6 Abdominal ports, Bladder incontinence    Previous Home Environment (from acute therapy documentation) Living Arrangements: Spouse/significant other  Lives With: Spouse Available Help at Discharge: Family, Available PRN/intermittently Type of Home: House Home Layout: Two level, Able to live on main level with bedroom/bathroom Home Access: Stairs to enter Entrance Stairs-Rails: None Entrance Stairs-Number of Steps: 3 steps in front; 1 step in garage Bathroom Shower/Tub: Multimedia programmer: Standard Bathroom Accessibility: Yes How Accessible: Accessible via  walker [place]: No   Discharge Living Setting Plans for Discharge Living Setting: Patient's home Type of Home at Discharge: House Discharge Home Layout: Two level, Able to live on main level with bedroom/bathroom Discharge Home Access: Stairs to enter Entrance Stairs-Rails: None Entrance Stairs-Number of Steps: 3 steps in front; 1 step in garage  Discharge Bathroom Shower/Tub: Walk-in shower Discharge Bathroom Toilet: Standard Discharge Bathroom Accessibility: Yes How Accessible: Accessible via walker Does the patient have any problems obtaining your medications?: No   Social/Family/Support Systems Patient Roles: Spouse Anticipated Caregiver: Saphyre Cillo, husband; daughters helping when in town; family plans to hire assistance Anticipated Caregiver's Contact Information: [name]: [phone] Caregiver Availability: Intermittent Discharge Plan Discussed with Primary Caregiver: Yes Is Caregiver In Agreement with Plan?: Yes Does Caregiver/Family have Issues with Lodging/Transportation while Pt is in Rehab?: No   Goals Patient/Family Goal for Rehab: PT/OT Supervision, SLP Min A Expected length of stay: 8-10 days Pt/Family Agrees to Admission and willing to participate: Yes Program Orientation Provided & Reviewed with Pt/Caregiver Including Roles  & Responsibilities: Yes   Decrease burden of Care through IP rehab admission: NA   Possible need for SNF placement upon discharge: Not anticipated   Patient Condition: I have reviewed medical records from [facility], spoken with CM, and family member. I discussed via phone for inpatient rehabilitation assessment.  Patient will benefit from ongoing PT, OT, and SLP, can actively participate in 3 hours of therapy a day 5 days of the week, and can make measurable gains during the admission.  Patient will also benefit from the coordinated team approach during an Inpatient Acute Rehabilitation admission.  The patient will  receive intensive therapy as well as Rehabilitation physician, nursing, social worker, and care management interventions.  Due to safety, skin/wound care, disease management, medication administration, pain management, and patient education the patient requires 24 hour a day rehabilitation nursing.  The patient is currently min A with mobility and basic ADLs.  Discharge setting and therapy post discharge at home with home health is anticipated.  Patient has agreed to participate in the Acute Inpatient Rehabilitation Program and will admit today.   Preadmission Screen Completed By:  Bethel Born, 06/24/2021 8:17 AM ______________________________________________________________________   Discussed status with Dr. Posey Pronto on 06/24/21 at 47  and received approval for admission today.   Admission Coordinator:  Bethel Born, [place] with updates by Clemens Catholic, time 1130/Date 06/24/21    Assessment/Plan: Diagnosis: Left PCA stroke Does the need for close, 24 hr/day Medical supervision in concert with the patient's rehab needs make it unreasonable for this patient to be served in a less intensive setting? Yes Co-Morbidities requiring supervision/potential complications: DVT, GERD, HTN, CVA, scoliosis Due to bladder management, safety, disease management, and patient education, does the patient require 24 hr/day rehab nursing? Yes Does the patient require coordinated care of a physician, rehab nurse, PT, OT, and SLP to address physical and functional deficits in the context of the above medical diagnosis(es)? Yes Addressing deficits in the following areas: balance, endurance, locomotion, strength, transferring, toileting, cognition, and psychosocial support Can the patient actively participate in an intensive therapy program of at least 3 hrs of therapy 5 days a week? Yes The potential for patient to make measurable gains while on inpatient rehab is excellent and good Anticipated functional  outcomes upon discharge from inpatient rehab: supervision PT, supervision OT, min assist SLP Estimated rehab length of stay to reach the above functional goals is: 7-10 days. Anticipated discharge destination: Home 10. Overall Rehab/Functional Prognosis: good     MD Signature: Delice Lesch, MD, ABPMR         Revision History

## 2021-06-24 NOTE — Progress Notes (Signed)
Patient presented to [facility]  with ambulance transport. Patient, spouse, and daughter oriented to unit and policies and procedures. Cletis Media, LPN

## 2021-06-25 DIAGNOSIS — I6622 Occlusion and stenosis of left posterior cerebral artery: Secondary | ICD-10-CM | POA: Diagnosis not present

## 2021-06-25 LAB — COMPREHENSIVE METABOLIC PANEL
ALT: 34 U/L (ref 0–44)
AST: 39 U/L (ref 15–41)
Albumin: 3 g/dL — ABNORMAL LOW (ref 3.5–5.0)
Alkaline Phosphatase: 103 U/L (ref 38–126)
Anion gap: 9 (ref 5–15)
BUN: 11 mg/dL (ref 8–23)
CO2: 24 mmol/L (ref 22–32)
Calcium: 8.7 mg/dL — ABNORMAL LOW (ref 8.9–10.3)
Chloride: 104 mmol/L (ref 98–111)
Creatinine, Ser: 0.73 mg/dL (ref 0.44–1.00)
GFR, Estimated: >60 mL/min (ref >60)
Glucose, Bld: 90 mg/dL (ref 70–99)
Potassium: 3.8 mmol/L (ref 3.5–5.1)
Sodium: 137 mmol/L (ref 135–145)
Total Bilirubin: 0.6 mg/dL (ref 0.3–1.2)
Total Protein: 5.8 g/dL — ABNORMAL LOW (ref 6.5–8.1)

## 2021-06-25 LAB — CBC WITH DIFFERENTIAL/PLATELET
Abs Immature Granulocytes: 0.02 10*3/uL (ref 0.00–0.07)
Basophils Absolute: 0.1 10*3/uL (ref 0.0–0.1)
Basophils Relative: 1 %
Eosinophils Absolute: 0.4 10*3/uL (ref 0.0–0.5)
Eosinophils Relative: 7 %
HCT: 36 % (ref 36.0–46.0)
Hemoglobin: 11.6 g/dL — ABNORMAL LOW (ref 12.0–15.0)
Immature Granulocytes: 0 %
Lymphocytes Relative: 28 %
Lymphs Abs: 1.8 10*3/uL (ref 0.7–4.0)
MCH: 28.6 pg (ref 26.0–34.0)
MCHC: 32.2 g/dL (ref 30.0–36.0)
MCV: 88.7 fL (ref 80.0–100.0)
Monocytes Absolute: 0.6 10*3/uL (ref 0.1–1.0)
Monocytes Relative: 9 %
Neutro Abs: 3.6 10*3/uL (ref 1.7–7.7)
Neutrophils Relative %: 55 %
Platelets: 306 10*3/uL (ref 150–400)
RBC: 4.06 MIL/uL (ref 3.87–5.11)
RDW: 16.5 % — ABNORMAL HIGH (ref 11.5–15.5)
WBC: 6.5 10*3/uL (ref 4.0–10.5)
nRBC: 0 % (ref 0.0–0.2)

## 2021-06-25 LAB — URINE CULTURE: Culture: >=100000 — AB

## 2021-06-25 NOTE — Progress Notes (Signed)
PROGRESS NOTE   Subjective/Complaints:    Objective:   No results found. Recent Labs    06/24/21 1336 06/25/21 0533  WBC 7.1 6.5  HGB 12.4 11.6*  HCT 38.4 36.0  PLT 356 306   Recent Labs    06/23/21 0515 06/24/21 1336 06/25/21 0533  NA 140  --  137  K 3.8  --  3.8  CL 106  --  104  CO2 27  --  24  GLUCOSE 92  --  90  BUN 15  --  11  CREATININE 0.62 0.76 0.73  CALCIUM 8.6*  --  8.7*    Intake/Output Summary (Last 24 hours) at 06/25/2021 0926 Last data filed at 06/24/2021 1837 Gross per 24 hour  Intake 100 ml  Output --  Net 100 ml        Physical Exam: Vital Signs Blood pressure 137/79, pulse 79, temperature 98 F (36.7 C), temperature source Oral, resp. rate 18, height 5\' 4"  (1.626 m), weight 62.8 kg, SpO2 95 %.   General: No acute distress Mood and affect are appropriate Heart: Regular rate and rhythm no rubs murmurs or extra sounds Lungs: Clear to auscultation, breathing unlabored, no rales or wheezes Abdomen: Positive bowel sounds, soft nontender to palpation, nondistended Extremities: No clubbing, cyanosis, or edema Skin: No evidence of breakdown, no evidence of rash Neurologic: Cranial nerves II through XII intact, motor strength is 5/5 in bilateral deltoid, bicep, tricep, grip, hip flexor, knee extensors, ankle dorsiflexor and plantar flexor Sensory exam normal sensation to light touch and proprioception in bilateral upper and lower extremities Cerebellar exam normal finger to nose to finger as well as heel to shin in bilateral upper and lower extremities RIght homonymous hemianopsia  Musculoskeletal: Full range of motion in all 4 extremities. No joint swelling   Assessment/Plan: 1. Functional deficits which require 3+ hours per day of interdisciplinary therapy in a comprehensive inpatient rehab setting. Physiatrist is providing close team supervision and 24 hour management of active medical  problems listed below. Physiatrist and rehab team continue to assess barriers to discharge/monitor patient progress toward functional and medical goals  Care Tool:  Bathing              Bathing assist       Upper Body Dressing/Undressing Upper body dressing   What is the patient wearing?: Hospital gown only    Upper body assist Assist Level: Supervision/Verbal cueing    Lower Body Dressing/Undressing Lower body dressing      What is the patient wearing?: Incontinence brief, Hospital gown only     Lower body assist       Toileting Toileting    Toileting assist Assist for toileting: Minimal Assistance - Patient > 75%     Transfers Chair/bed transfer  Transfers assist           Locomotion Ambulation   Ambulation assist              Walk 10 feet activity   Assist           Walk 50 feet activity   Assist           Walk 150 feet  activity   Assist           Walk 10 feet on uneven surface  activity   Assist           Wheelchair     Assist               Wheelchair 50 feet with 2 turns activity    Assist            Wheelchair 150 feet activity     Assist          Blood pressure 137/79, pulse 79, temperature 98 F (36.7 C), temperature source Oral, resp. rate 18, height 5\' 4"  (1.626 m), weight 62.8 kg, SpO2 95 %.    Medical Problem List and Plan: 1.  Decreased functional mobility with gait disturbance secondary and dense right homonymous hemianopsia due  to large acute left posterior cerebral artery infarction after robotic paraesophageal hernia repair, gastropexy, upper endoscopy 06/15/2021 as well as history of right thalamic CVA 2019             -patient may shower             -ELOS/Goals: 6-9 days/Supervision             Admit to CIR 2.  Antithrombotics: -DVT/anticoagulation:  Pharmaceutical: Lovenox             -antiplatelet therapy: Aspirin 81 mg daily and Plavix and 5 mg day x3  weeks then Plavix alone 3. Pain Management: Tylenol as needed 4. Mood history of cognitive impairment:              -antipsychotic agents: Seroquel 25 mg nightly 5. Neuropsych: This patient is not fully capable of making decisions on her own behalf. 6. Skin/Wound Care: Routine skin checks 7. Fluids/Electrolytes/Nutrition: Routine in and outs CMP ordered for tomorrow 8.  Hypertension.  Avapro 150 mg daily.   Monitor with increased mobility 9.  Hyperlipidemia.  Lipitor 80 10.  Gram-negative rod UTI.  Complete course of Rocephin last dose 9/24 11.  Hypothyroidism.  Synthroid    LOS: 1 days A FACE TO FACE EVALUATION WAS PERFORMED  10/24 06/25/2021, 9:26 AM

## 2021-06-25 NOTE — Evaluation (Signed)
Occupational Therapy Assessment and Plan  Patient Details  Name: Caroline [name] MRN: [id number] Date of Birth: April 14, 1936  OT Diagnosis: abnormal posture, altered mental status, cognitive deficits, disturbance of vision, hemiplegia affecting dominant side, and muscle weakness (generalized) Rehab Potential: Rehab Potential (ACUTE ONLY): Excellent ELOS: 10-14 days   Today's Date: 06/25/2021 OT Individual Time: 5449-2010 OT Individual Time Calculation (min): 66 min     Hospital Problem: Active Problems:   CVA (cerebral vascular accident) [facility])   Past Medical History:  Past Medical History:  Diagnosis Date   Allergy    Anxiety    Arthritis    Cataract    Celiac sprue    CVA (cerebral vascular accident) ([place]) 02/2018   R thalamic CVA   DVT (deep vein thrombosis) in pregnancy    GERD (gastroesophageal reflux disease)    Glaucoma    Hormone replacement therapy (HRT)    Hypercholesteremia    Hyperlipidemia    Hypertension    Hypoglycemia    Hypothyroidism    OAB (overactive bladder)    Osteoporosis    PONV (postoperative nausea and vomiting)    Raynaud's disease    Scoliosis    Seasonal allergies    Senile purpura ([place])    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 Cheek   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   Bridge Left Nose, and Left Neck   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 Cheek Tx: curret x 3 and 5FU   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   Bridge of nose tx Cx3 and 5FU, Left neck Tx Cx3 and 5FU   Thrombophlebitis    Past Surgical History:  Past Surgical History:  Procedure Laterality Date   ABDOMINAL HYSTERECTOMY  1979   CATARACT EXTRACTION, BILATERAL     knee surgery Left    VARICOSE VEIN SURGERY Left    XI ROBOTIC ASSISTED PARAESOPHAGEAL HERNIA REPAIR N/A 06/15/2021   Procedure: XI ROBOTIC ASSISTED PARAESOPHAGEAL HERNIA REPAIR, GASTROPEXY, UPPER ENDOSCOPY;  Surgeon: Clovis Riley, MD;   Location: [facility];  Service: General;  Laterality: N/A;    Assessment & Plan Clinical Impression: Patient is a 85 y.o. year old female with recent admission to the hospital on 06/15/2021 with intermittent heartburn and episodes of severe regurgitation.  She had an endoscopy in February 2020 confirming grade D reflux esophagitis and again June 2020 with little improvement in endoscopic findings on high-dose PPI.  Biopsies were negative.  She had a normal gastric emptying study in 2020.  Follow-up general surgery patient underwent robotic paraesophageal hernia repair, gastropexy, upper endoscopy 06/15/2021 per Dr. Kae Heller.  Her diet was slowly advanced.  Post procedure noted somnolence increased disorientation.  CT/MRI showed large acute left PCA territory infarct without hemorrhage or mass-effect.  Patient did not receive tPA.  MRA showed occlusion of the left PCA at the distal P2 segment.  Severe atrophy and chronic small vessel ischemia.Patient transferred to CIR on 06/24/2021 .    Patient currently requires mod with basic self-care skills secondary to muscle weakness and muscle paralysis, impaired timing and sequencing, unbalanced muscle activation, decreased coordination, and decreased motor planning, field cut and hemianopsia, decreased attention to right and decreased motor planning, decreased awareness, decreased problem solving, decreased safety awareness, decreased memory, and delayed processing, and decreased standing balance, decreased postural control, hemiplegia, and decreased balance strategies.  Prior to hospitalization, patient could complete ADLs with modified independent .  Patient will benefit from skilled  intervention to decrease level of assist with basic self-care skills and increase independence with basic self-care skills prior to discharge home with care partner.  Anticipate patient will require 24 hour supervision and follow up outpatient.  OT - End of Session Activity Tolerance:  Improving Endurance Deficit: Yes OT Assessment Rehab Potential (ACUTE ONLY): Excellent OT Patient demonstrates impairments in the following area(s): Balance;Cognition;Endurance;Motor;Pain;Vision;Safety;Perception OT Basic ADL's Functional Problem(s): Eating;Grooming;Bathing;Dressing;Toileting OT Advanced ADL's Functional Problem(s): Simple Meal Preparation OT Transfers Functional Problem(s): Tub/Shower;Toilet OT Additional Impairment(s): None OT Plan OT Intensity: Minimum of 1-2 x/day, 45 to 90 minutes OT Frequency: 5 out of 7 days OT Duration/Estimated Length of Stay: 10-14 days OT Treatment/Interventions: Balance/vestibular training;Discharge planning;Self Care/advanced ADL retraining;Therapeutic Activities;UE/LE Coordination activities;Pain management;Cognitive remediation/compensation;Disease mangement/prevention;Functional mobility training;Patient/family education;Therapeutic Exercise;Visual/perceptual remediation/compensation;Community reintegration;DME/adaptive equipment instruction;Neuromuscular re-education;UE/LE Strength taining/ROM;Wheelchair propulsion/positioning;Psychosocial support OT Self Feeding Anticipated Outcome(s): independent OT Basic Self-Care Anticipated Outcome(s): supervision OT Toileting Anticipated Outcome(s): supervision OT Bathroom Transfers Anticipated Outcome(s): supervision OT Recommendation Recommendations for Other Services: Therapeutic Recreation consult Therapeutic Recreation Interventions: Stress management Patient destination: Home Follow Up Recommendations: Outpatient OT;24 hour supervision/assistance Equipment Recommended: None recommended by OT   OT Evaluation Precautions/Restrictions  Precautions Precautions: Fall Precaution Comments: visual neglect R side Restrictions Weight Bearing Restrictions: No   Pain Pain Assessment Pain Scale: Faces Pain Score: 0-No pain Faces Pain Scale: No hurt Home Living/Prior Functioning Home  Living Family/patient expects to be discharged to:: Private residence Living Arrangements: Spouse/significant other Available Help at Discharge: Family, Available PRN/intermittently (daughters will be helping from [place] as well) Type of Home: House Home Access: Stairs to enter [employer] of Steps: 3 steps in front; 1 step in garage Entrance Stairs-Rails: None Home Layout: Multi-level, Full bath on main level, Able to live on main level with bedroom/bathroom Bathroom Shower/Tub: Multimedia programmer: Standard Bathroom Accessibility: Yes Additional Comments: Has BSC  Lives With: Spouse IADL History Homemaking Responsibilities: Yes Meal Prep Responsibility: Secondary (both spouse and pt participated in tasks) Laundry Responsibility: Secondary Cleaning Responsibility: Secondary Current License: Yes Occupation: Retired Prior Function Level of Independence: Independent with basic ADLs, Independent with gait, Independent with homemaking with ambulation  Able to Take Stairs?: Yes Driving: Yes Vision Baseline Vision/History: 1 Wears glasses Ability to See in Adequate Light: 1 Impaired Patient Visual Report: No change from baseline Vision Assessment?: Yes Ocular Range of Motion: Within Functional Limits Alignment/Gaze Preference: Within Defined Limits Tracking/Visual Pursuits: Decreased smoothness of horizontal tracking;Decreased smoothness of vertical tracking Convergence: Impaired (comment) (right eye does not converge) Perception  Perception: Impaired Inattention/Neglect: Does not attend to right visual field Praxis Praxis: Impaired Praxis Impairment Details: Initiation;Motor planning Cognition Overall Cognitive Status: Impaired/Different from baseline Arousal/Alertness: Awake/alert Orientation Level: Person;Place;Situation Person: Oriented Place: Disoriented Situation: Disoriented Year: 2022 Month: April Day of Week: Incorrect (Tuesday) Memory:  Impaired Memory Impairment: Storage deficit;Decreased recall of new information Decreased Short Term Memory: Verbal basic;Functional basic Immediate Memory Recall: Sock;Blue;Bed Memory Recall Sock: Not able to recall Memory Recall Blue: Not able to recall Memory Recall Bed: Not able to recall Attention: Sustained;Focused;Selective Focused Attention: Appears intact Sustained Attention: Appears intact Selective Attention: Impaired Awareness: Impaired Awareness Impairment: Intellectual impairment;Anticipatory impairment;Emergent impairment Problem Solving: Impaired Problem Solving Impairment: Functional basic Safety/Judgment: Impaired Sensation Sensation Light Touch: Appears Intact Hot/Cold: Appears Intact Proprioception: Appears Intact Stereognosis: Appears Intact Additional Comments: Sensation grossly intact in BUEs, will examine closer in treatment. Coordination Gross Motor Movements are Fluid and Coordinated: Yes Fine Motor Movements are Fluid and Coordinated: Yes Coordination and Movement Description: BUE coordination  WFLs noted during ADL tasks with pt efficiently tying the string on her pants for FM coordination. Motor  Motor Motor: Abnormal postural alignment and control Motor - Skilled Clinical Observations: Flexed kyphotic posture in standing.  Trunk/Postural Assessment  Cervical Assessment Cervical Assessment: Exceptions to [facility] (forward head) Thoracic Assessment Thoracic Assessment: Exceptions to [facility] (thoracic kyphosis with hx of scoliosis per family) Lumbar Assessment Lumbar Assessment: Exceptions to [facility] (posterior pelvic tilt in sitting with increased lumbar flexion noted in standing.  Inability to achieve neutral lumbar extension.)  Balance Balance Balance Assessed: Yes Static Sitting Balance Static Sitting - Balance Support: Feet supported Static Sitting - Level of Assistance: 5: Stand by assistance Dynamic Sitting Balance Dynamic Sitting - Balance Support:  During functional activity Dynamic Sitting - Level of Assistance: 5: Stand by assistance Static Standing Balance Static Standing - Balance Support: During functional activity Static Standing - Level of Assistance: 4: Min assist Dynamic Standing Balance Dynamic Standing - Balance Support: During functional activity Dynamic Standing - Level of Assistance: 3: Mod assist Extremity/Trunk Assessment RUE Assessment RUE Assessment: Within Functional Limits Active Range of Motion (AROM) Comments: WFLS General Strength Comments: 4/5 throughout LUE Assessment LUE Assessment: Within Functional Limits Active Range of Motion (AROM) Comments: WFLs General Strength Comments: strength 4/5 throughout  Care Tool Care Tool Self Care Eating   Eating Assist Level: Set up assist    Oral Care    Oral Care Assist Level: Set up assist    Bathing   Body parts bathed by patient: Right arm;Left arm;Chest;Abdomen;Front perineal area;Buttocks;Right upper leg;Left upper leg;Face;Left lower leg;Right lower leg     Assist Level: Minimal Assistance - Patient > 75%    Upper Body Dressing(including orthotics)   What is the patient wearing?: Pull over shirt   Assist Level: Supervision/Verbal cueing    Lower Body Dressing (excluding footwear)   What is the patient wearing?: Incontinence brief;Pants Assist for lower body dressing: Moderate Assistance - Patient 50 - 74%    Putting on/Taking off footwear   What is the patient wearing?: Shoes;Ted hose Assist for footwear: Supervision/Verbal cueing       Care Tool Toileting Toileting activity   Assist for toileting: Minimal Assistance - Patient > 75%     Care Tool Bed Mobility Roll left and right activity        Sit to lying activity   Sit to lying assist level: Minimal Assistance - Patient > 75%    Lying to sitting on side of bed activity   Lying to sitting on side of bed assist level: the ability to move from lying on the back to sitting on the side  of the bed with no back support.: Minimal Assistance - Patient > 75%     Care Tool Transfers Sit to stand transfer   Sit to stand assist level: Minimal Assistance - Patient > 75%    Chair/bed transfer   Chair/bed transfer assist level: Moderate Assistance - Patient 50 - 74% (no device)     Toilet transfer   Assist Level: Moderate Assistance - Patient 50 - 74% (no assistive device)     Care Tool Cognition  Expression of Ideas and Wants Expression of Ideas and Wants: 3. Some difficulty - exhibits some difficulty with expressing needs and ideas (e.g, some words or finishing thoughts) or speech is not clear  Understanding Verbal and Non-Verbal Content Understanding Verbal and Non-Verbal Content: 3. Usually understands - understands most conversations, but misses some part/intent of message. Requires cues at  times to understand   Memory/Recall Ability Memory/Recall Ability : None of the above were recalled   Refer to Care Plan for Long Term Goals  SHORT TERM GOAL WEEK 1 OT Short Term Goal 1 (Week 1): Pt will complete LB bathing sit to stand with supervision for two consecutive sessions. OT Short Term Goal 2 (Week 1): Pt will complete LB dressing with min guard sit to stand for two consecutive sessions. OT Short Term Goal 3 (Week 1): Pt will complete tolet transfers with min guard asisst using the RW and elevated toilet with rail. OT Short Term Goal 4 (Week 1): Pt will compensate for right visual field deficit by locating at least 3 items placed right of midline during selfcare tasks with no more than min instructional cueing.  Recommendations for other services: Therapeutic Recreation  Stress management   Skilled Therapeutic Intervention ADL ADL Eating: Supervision/safety Where Assessed-Eating: Edge of bed Grooming: Supervision/safety Where Assessed-Grooming: Edge of bed Upper Body Bathing: Supervision/safety Where Assessed-Upper Body Bathing: Edge of bed Lower Body Bathing:  Minimal assistance Where Assessed-Lower Body Bathing: Edge of bed Upper Body Dressing: Setup Where Assessed-Upper Body Dressing: Edge of bed Lower Body Dressing: Minimal assistance Where Assessed-Lower Body Dressing: Edge of bed Toileting: Minimal assistance Toilet Transfer: Moderate assistance Toilet Transfer Method: Counselling psychologist: Radiographer, therapeutic: Not assessed Social research officer, government: Not assessed Mobility  Bed Mobility Bed Mobility: Supine to Sit;Sit to Supine Supine to Sit: Minimal Assistance - Patient > 75% Sit to Supine: Minimal Assistance - Patient > 75% Transfers Sit to Stand: Minimal Assistance - Patient > 75% Stand to Sit: Minimal Assistance - Patient > 75%  Session Note:  Pt's family present for eval including daughter and pt's spouse.  They plan to provide 24 hr supervision at discharge and will be very supportive of pt while she is here as well.  Pt was not oriented to place, time, or situation, stating that she was in [place].  She was able to transfer to the EOB with min guard assist and HOB lowered.  Sitting balance during selfcare tasks were at supervision level with max instructional cueing to sequence through bathing tasks EOB secondary to pt attempting to re-wash body parts instead of sequencing to the next.  Min assist for sit to stand with increased thoracic and lumbar flexion noted secondary to history of scoliosis.  She was able to donn UB clothing with setup and min assist for most LB clothing sit to stand except for TEDS which were total assist.  She ambulated to the door of her room and back without an assistive device and mod assist initially, progressing to min assist.  Moderate right visual field deficit noted secondary to pt attempting to sit back down toward the foot of the bed instead of the head of the bed.  Discussed estimated LOS with pt and family of around 2 weeks depending on progress with supervision level goals.   They were in agreement with this.  Pt returned to supine with min assist to rest in preparation for next PT session.  Call button and phone in reach with family present.   Discharge Criteria: Patient will be discharged from OT if patient refuses treatment 3 consecutive times without medical reason, if treatment goals not met, if there is a change in medical status, if patient makes no progress towards goals or if patient is discharged from hospital.  The above assessment, treatment plan, treatment alternatives and goals were discussed and mutually agreed  upon: by patient and by family  Jamia Hoban OTR/L 06/25/2021, 10:46 AM

## 2021-06-25 NOTE — Plan of Care (Signed)
  Problem: RH Cognition - SLP Goal: RH LTG Patient will demonstrate orientation with cues Description:  LTG:  Patient will demonstrate orientation to person/place/time/situation with cues (SLP)   Flowsheets (Taken 06/25/2021 1659) LTG Patient will demonstrate orientation to:  Person  Place  Time  Situation LTG: Patient will demonstrate orientation using cueing (SLP): Minimal Assistance - Patient > 75%   Problem: RH Memory Goal: LTG Patient will use memory compensatory aids to (SLP) Description: LTG:  Patient will use memory compensatory aids to recall biographical/new, daily complex information with cues (SLP) Flowsheets (Taken 06/25/2021 1659) LTG: Patient will use memory compensatory aids to (SLP): Moderate Assistance - Patient 50 - 74%   Problem: RH Awareness Goal: LTG: Patient will demonstrate awareness during functional activites type of (SLP) Description: LTG: Patient will demonstrate awareness during functional activites type of (SLP) Flowsheets (Taken 06/25/2021 1659) Patient will demonstrate during cognitive/linguistic activities awareness type of: Intellectual LTG: Patient will demonstrate awareness during cognitive/linguistic activities with assistance of (SLP): Minimal Assistance - Patient > 75%

## 2021-06-25 NOTE — Progress Notes (Signed)
Inpatient Rehabilitation Care Coordinator Assessment and Plan Patient Details  Name: Caroline [name] MRN: [id number] Date of Birth: 09/01/1936  Today's Date: 06/25/2021  Hospital Problems: Active Problems:   CVA (cerebral vascular accident) [facility])  Past Medical History:  Past Medical History:  Diagnosis Date   Allergy    Anxiety    Arthritis    Cataract    Celiac sprue    CVA (cerebral vascular accident) ([place]) 02/2018   R thalamic CVA   DVT (deep vein thrombosis) in pregnancy    GERD (gastroesophageal reflux disease)    Glaucoma    Hormone replacement therapy (HRT)    Hypercholesteremia    Hyperlipidemia    Hypertension    Hypoglycemia    Hypothyroidism    OAB (overactive bladder)    Osteoporosis    PONV (postoperative nausea and vomiting)    Raynaud's disease    Scoliosis    Seasonal allergies    Senile purpura ([place])    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 Cheek   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   Bridge Left Nose, and Left Neck   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 Cheek Tx: curret x 3 and 5FU   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   Bridge of nose tx Cx3 and 5FU, Left neck Tx Cx3 and 5FU   Thrombophlebitis    Past Surgical History:  Past Surgical History:  Procedure Laterality Date   ABDOMINAL HYSTERECTOMY  1979   CATARACT EXTRACTION, BILATERAL     knee surgery Left    VARICOSE VEIN SURGERY Left    XI ROBOTIC ASSISTED PARAESOPHAGEAL HERNIA REPAIR N/A 06/15/2021   Procedure: XI ROBOTIC ASSISTED PARAESOPHAGEAL HERNIA REPAIR, GASTROPEXY, UPPER ENDOSCOPY;  Surgeon: Clovis Riley, MD;  Location: [facility];  Service: General;  Laterality: N/A;   Social History:  reports that she has never smoked. She has never used smokeless tobacco. She reports that she does not currently use drugs. She reports that she does not drink alcohol.  Family / Support Systems Marital Status: Married Patient  Roles: Spouse Spouse/Significant Other: [name] Tsui Children: Irven Coe (Daughter) Anticipated Caregiver: spouse and daughter (when in town), potentially plans to hire care Ability/Limitations of Caregiver: intermittent Caregiver Availability: Intermittent Family Dynamics: some family support  Social History Preferred language: English Religion: Non-Denominational Cultural Background: Catholic Education: [place] - How often do you need to have someone help you when you read instructions, pamphlets, or other written material from your doctor or pharmacy?: Never Writes: Yes Employment Status: Retired Public relations account executive Issues: n/a Guardian/Conservator: n/a   Abuse/Neglect Abuse/Neglect Assessment Can Be Completed: Yes Physical Abuse: Denies Verbal Abuse: Denies Sexual Abuse: Denies Exploitation of patient/patient's resources: Denies Self-Neglect: Denies  Patient response to: Social Isolation - How often do you feel lonely or isolated from those around you?: Never  Emotional Status Pt's affect, behavior and adjustment status: patient pleasant Recent Psychosocial Issues: hx of anxiety Psychiatric History: n/a Substance Abuse History: n/a  Patient / Family Perceptions, Expectations & Goals Pt/Family understanding of illness & functional limitations: yes, famiy at bedside (spouse and daughter) Premorbid pt/family roles/activities: previously independent, working and driving Anticipated changes in roles/activities/participation: spouse and faughter to [employer] Pt/family expectations/goals: Supervision to min [place] Agencies: None Premorbid Home Care/DME Agencies: Other (Comment) ([place], [place], [place], Civil engineer, contracting) Transportation available at discharge: spouse able to transport Is the patient able to respond  to transportation needs?: Yes In the past 12 months, has lack of transportation kept you from  medical appointments or from getting medications?: No In the past 12 months, has lack of transportation kept you from meetings, work, or from getting things needed for daily living?: No Resource referrals recommended: Neuropsychology  Discharge Planning Living Arrangements: Spouse/significant other Support Systems: Spouse/significant other, Children Type of Residence: Private residence (2 level home) Insurance Resources: Multimedia programmer (specify) (U[employer] Medicare) Financial Resources: Employment Museum/gallery curator Screen Referred: No Living Expenses: Education officer, community Management: Patient, Spouse Does the patient have any problems obtaining your medications?: No Home Management: Independent Patient/Family Preliminary Plans: souse and daughters able to assist Care Coordinator Barriers to Discharge: Lack of/limited family support Care Coordinator Anticipated Follow Up Needs: HH/OP Expected length of stay: 8-10 Days  Clinical Impression SW met with pt, spouse and daughter at bedside. Introduced self, explained role and addressed questions and concerns. Pt will discharge home with spouse as primary caregiver. Daughters able to assist when available. No additional questions or concerns, sw will continue to follow up.  Dyanne Iha 06/25/2021, 2:50 PM

## 2021-06-25 NOTE — Evaluation (Signed)
Physical Therapy Assessment and Plan  Patient Details  Name: Caroline [name] MRN: [id number] Date of Birth: 22-May-1936  PT Diagnosis: Abnormal posture, Abnormality of gait, Cognitive deficits, Coordination disorder, Difficulty walking, Impaired cognition, Low back pain, and Muscle weakness Rehab Potential: Fair ELOS: 10-14 days   Today's Date: 06/25/2021 PT Individual Time: 3704-8889 PT Individual Time Calculation (min): 82 min    Hospital Problem: Active Problems:   CVA (cerebral vascular accident) [facility])   Past Medical History:  Past Medical History:  Diagnosis Date   Allergy    Anxiety    Arthritis    Cataract    Celiac sprue    CVA (cerebral vascular accident) ([place]) 02/2018   R thalamic CVA   DVT (deep vein thrombosis) in pregnancy    GERD (gastroesophageal reflux disease)    Glaucoma    Hormone replacement therapy (HRT)    Hypercholesteremia    Hyperlipidemia    Hypertension    Hypoglycemia    Hypothyroidism    OAB (overactive bladder)    Osteoporosis    PONV (postoperative nausea and vomiting)    Raynaud's disease    Scoliosis    Seasonal allergies    Senile purpura ([place])    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 Cheek   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   Bridge Left Nose, and Left Neck   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 Cheek Tx: curret x 3 and 5FU   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   Bridge of nose tx Cx3 and 5FU, Left neck Tx Cx3 and 5FU   Thrombophlebitis    Past Surgical History:  Past Surgical History:  Procedure Laterality Date   ABDOMINAL HYSTERECTOMY  1979   CATARACT EXTRACTION, BILATERAL     knee surgery Left    VARICOSE VEIN SURGERY Left    XI ROBOTIC ASSISTED PARAESOPHAGEAL HERNIA REPAIR N/A 06/15/2021   Procedure: XI ROBOTIC ASSISTED PARAESOPHAGEAL HERNIA REPAIR, GASTROPEXY, UPPER ENDOSCOPY;  Surgeon: Clovis Riley, MD;  Location: [facility];  Service: General;   Laterality: N/A;    Assessment & Plan Clinical Impression: Patient is a 85 y.o. 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  History taken from chart review patient and husband.  Patient lives with spouse.  Two-level home bed and bath main level with one-step to entry.  Reportedly independent prior to admission able to perform her own ADLs although she did need supervision assist.  Presented to [facility] on 06/15/2021 with intermittent heartburn and episodes of severe regurgitation.  She had an endoscopy in February 2020 confirming grade D reflux esophagitis and again June 2020 with little improvement in endoscopic findings on high-dose PPI.  Biopsies were negative.  She had a normal gastric emptying study in 2020.  Follow-up general surgery patient underwent robotic paraesophageal hernia repair, gastropexy, upper endoscopy 06/15/2021 per Dr. Kae Heller.  Her diet was slowly advanced.  Post procedure noted somnolence increased disorientation.  CT/MRI showed large acute left PCA territory infarct without hemorrhage or mass-effect.  Patient did not receive tPA.  MRA showed occlusion of the left PCA at the distal P2 segment.  Severe atrophy and chronic small vessel ischemia.  Echocardiogram with ejection fraction of 60 to 16%, grade 1 diastolic dysfunction. Neurology follow-up maintained on aspirin as well as Plavix x21 days  then Plavix alone.  Lovenox for DVT prophylaxis.  Preliminary urine culture 06/22/2021 greater 100,000 gram-negative rods currently maintained on IV Rocephin.  Therapy evaluations completed due weakness, limitations in self-care after recent GI procedure was admitted for a comprehensive rehab program. Please see preadmission assessment from earlier today as well.   Patient currently requires min with  mobility secondary to muscle weakness, decreased coordination and decreased motor planning, decreased visual perceptual skills, decreased attention to right and decreased motor planning, decreased attention, decreased awareness, decreased safety awareness, and decreased memory, and decreased balance strategies.  Prior to hospitalization, patient was independent  with mobility and lived with Spouse in a House home.  Home access is 3 steps in front; 1 step in garageStairs to enter.  Patient will benefit from skilled PT intervention to maximize safe functional mobility, minimize fall risk, and decrease caregiver burden for planned discharge home with intermittent assist.  Anticipate patient will benefit from follow up [facility] at discharge.  PT - End of Session Activity Tolerance: Tolerates 30+ min activity with multiple rests Endurance Deficit: Yes Endurance Deficit Description: Requires rest breaks PT Assessment Rehab Potential (ACUTE/IP ONLY): Fair PT Barriers to Discharge: Decreased caregiver support;Home environment access/layout;Lack of/limited family support PT Patient demonstrates impairments in the following area(s): Endurance;Perception;Safety;Balance PT Transfers Functional Problem(s): Bed to Chair;Car;Bed Mobility;Furniture PT Locomotion Functional Problem(s): Ambulation;Stairs PT Plan PT Intensity: Minimum of 1-2 x/day ,45 to 90 minutes PT Frequency: 5 out of 7 days PT Duration Estimated Length of Stay: 10-14 days PT Treatment/Interventions: Ambulation/gait training;Community reintegration;DME/adaptive equipment instruction;Neuromuscular re-education;Psychosocial support;Stair training;UE/LE Strength taining/ROM;Wheelchair propulsion/positioning;Balance/vestibular training;Discharge planning;Functional electrical stimulation;Pain management;Skin care/wound management;Therapeutic Activities;UE/LE Coordination activities;Cognitive remediation/compensation;Disease management/prevention;Functional  mobility training;Patient/family education;Splinting/orthotics;Visual/perceptual remediation/compensation;Therapeutic Exercise PT Transfers Anticipated Outcome(s): Supervision PT Locomotion Anticipated Outcome(s): Supervision  PT Recommendation Recommendations for Other Services: Therapeutic Recreation consult Therapeutic Recreation Interventions: Pet therapy;Stress management Follow Up Recommendations: Home health PT Patient destination: Home Equipment Recommended: To be determined   PT Evaluation Precautions/Restrictions Precautions Precautions: Fall Precaution Comments: visual neglect R side Restrictions Weight Bearing Restrictions: No Pain Interference Pain Interference Pain Effect on Sleep: 1. Rarely or not at all Pain Interference with Therapy Activities: 1. Rarely or not at all Pain Interference with Day-to-Day Activities: 1. Rarely or not at all Home Living/Prior [place]: Spouse/significant other Available Help at Discharge: Family;Available PRN/intermittently (daughters will be helping from [place] as well) Type of Home: House Home Access: Stairs to enter [employer] of Steps: 3 steps in front; 1 step in garage Entrance Stairs-Rails: None Home Layout: Multi-level;Full bath on main level;Able to live on main level with bedroom/bathroom Bathroom Shower/Tub: Multimedia programmer: Standard Bathroom Accessibility: Yes Additional Comments: Has BSC  Lives With: Spouse Prior Function Level of Independence: Independent with basic ADLs;Independent with gait;Independent with homemaking with ambulation  Able to Take Stairs?: Yes Driving: Yes Vision/Perception  Vision - History Ability to See in Adequate Light: 1 Impaired Vision - Assessment Ocular Range of Motion: Within Functional Limits Alignment/Gaze Preference: Within Defined Limits Tracking/Visual Pursuits: Decreased smoothness of horizontal tracking;Decreased  smoothness of vertical tracking Convergence: Impaired (comment) (right eye does not converge) Perception Perception: Impaired Inattention/Neglect: Does not attend to right visual field Praxis Praxis: Impaired Praxis Impairment Details: Initiation;Motor planning  Cognition Overall Cognitive Status: Impaired/Different from baseline Arousal/Alertness: Awake/alert Orientation Level: Disoriented X4 Memory: Impaired Memory Impairment: Storage deficit;Decreased recall of new information Decreased Short Term Memory: Verbal basic;Functional basic Problem Solving: Impaired Problem Solving Impairment: Functional basic Safety/Judgment: Impaired Sensation Sensation Light Touch: Appears Intact  Hot/Cold: Appears Intact Proprioception: Appears Intact Stereognosis: Appears Intact Additional Comments: Sensation grossly intact in BUEs, will examine closer in treatment. Coordination Gross Motor Movements are Fluid and Coordinated: Yes Fine Motor Movements are Fluid and Coordinated: Yes Coordination and Movement Description: BUE coordination WFLs noted during ADL tasks with pt efficiently tying the string on her pants for FM coordination. 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 posture  Trunk/Postural Assessment  Cervical Assessment Cervical Assessment: Exceptions to [facility] (Forward head) Thoracic Assessment Thoracic Assessment: Exceptions to [facility] (Thoracic scoliosis, kyphotic) Lumbar Assessment Lumbar Assessment: Exceptions to [facility] (Posterior pelvic tilt) Postural Control Postural Control: Within Functional Limits  Balance Balance Balance Assessed: Yes Static Sitting Balance Static Sitting - Balance Support: Feet supported;Bilateral upper extremity supported Static Sitting - Level of Assistance: 5:  Stand by assistance Dynamic Sitting Balance Dynamic Sitting - Balance Support: Feet supported;During functional activity Dynamic Sitting - Level of Assistance: 5: Stand by assistance Dynamic Sitting - Balance Activities: Forward lean/weight shifting Static Standing Balance Static Standing - Balance Support: During functional activity;Bilateral upper extremity supported Static Standing - Level of Assistance: 4: Min assist Dynamic Standing Balance Dynamic Standing - Balance Support: During functional activity;Bilateral upper extremity supported Dynamic Standing - Level of Assistance: 4: Min assist Extremity Assessment  RLE Assessment RLE Assessment: Exceptions to [facility] RLE Strength Right Hip Flexion: 3+/5 Right Hip ABduction: 4/5 Right Hip ADduction: 4/5 Right Knee Flexion: 4-/5 Right Knee Extension: 4/5 Right Ankle Dorsiflexion: 4-/5 Right Ankle Plantar Flexion: 3+/5 LLE Assessment LLE Assessment: Exceptions to [facility] LLE Strength Left Hip Flexion: 4-/5 Left Hip ABduction: 4/5 Left Hip ADduction: 4/5 Left Knee Flexion: 4-/5 Left Knee Extension: 4-/5 Left Ankle Dorsiflexion: 4-/5 Left Ankle Plantar Flexion: 4-/5  Care Tool Care Tool Bed Mobility Roll left and right activity   Roll left and right assist level: Minimal Assistance - Patient > 75% (HOB flat, bedrail)    Sit to lying activity   Sit to lying assist level: Minimal Assistance - Patient > 75% (HOB flat, bedrail)    Lying to sitting on side of bed activity   Lying to sitting on side of bed assist level: the ability to move from lying on the back to sitting on the side of the bed with no back support.: Minimal Assistance - Patient > 75% (HOB flat)     Care Tool Transfers Sit to stand transfer   Sit to stand assist level: Moderate Assistance - Patient 50 - 74% (No device)    Chair/bed transfer   Chair/bed transfer assist level: Moderate Assistance - Patient 50 - 74% (No AD)     Toilet transfer   Assist Level:  Moderate Assistance - Patient 50 - 74% (no assistive device)    Car transfer Car transfer activity did not occur: Safety/medical concerns (Fatigue, R side inattention)        Care Tool Locomotion Ambulation   Assist level: Moderate Assistance - Patient 50 - 74% Assistive device: No Device Max distance: 10'  Walk 10 feet activity   Assist level: Moderate Assistance - Patient - 50 - 74% Assistive device: No Device   Walk 50 feet with 2 turns activity   Assist level: Total Assistance - Patient < 25% Assistive device: No Device  Walk 150 feet activity   Assist level: Total Assistance - Patient < 25% Assistive device:  No Device  Walk 10 feet on uneven surfaces activity Walk 10 feet on uneven surfaces activity did not occur: Safety/medical concerns (Fatigue, R side inattention)      Stairs Stair activity did not occur: Safety/medical concerns (Fatigue, R side inattention)        Walk up/down 1 step activity Walk up/down 1 step or curb (drop down) activity did not occur: Safety/medical concerns (Fatigue, R side inattention)     Walk up/down 4 steps activity did not occuR: Safety/medical concerns (Fatigue, R side inattention)  Walk up/down 4 steps activity      Walk up/down 12 steps activity Walk up/down 12 steps activity did not occur: Safety/medical concerns (Fatigue, R side inattention)      Pick up small objects from floor Pick up small object from the floor (from standing position) activity did not occur: Safety/medical concerns (Fatigue, R side inattention)      Wheelchair Is the patient using a wheelchair?: No          Wheel 50 feet with 2 turns activity      Wheel 150 feet activity        Refer to Care Plan for Long Term Goals  SHORT TERM GOAL WEEK 1 PT Short Term Goal 1 (Week 1): Pt will perform sit <>stands w/CGA consistently w/LRAD and min cues PT Short Term Goal 2 (Week 1): Pt will demonstrate R head turn to attend to R visual field during functional  mobility to avoid obstacles w/min cues only PT Short Term Goal 3 (Week 1): Pt will ambulate 150' w/LRAD and CGA w/min cues for AD management PT Short Term Goal 4 (Week 1): Pt will ascend/descend 4 6" steps w/1 handrail and mod A  to prepare for home environment  Recommendations for other services: Therapeutic Recreation  Pet therapy and Stress management  Skilled Therapeutic Intervention Evaluation completed (see details above and below) with education on PT POC, CIR therapy requirement, role of PT, BEFAST and goals Pt received supine in bed, husband and daughter present for evaluation. Pt denied pain, but family interjected to report she has been complaining of low back and R eye pain. Per family, pt is not trustworthy historian and short term memory has been greatly impacted by most recent stroke. Majority of subjective eval obtained via family report, as pt unable to recall and was disoriented x4. Pt perseverated on asking if she could drive throughout eval and was reminded 10+ times that she has had a second stroke, pt unable to retain new information longer than 2 minutes. Pt performed bed mobility w/min A, HOB flat and use of bedrail. Heavy verbal cues for directions, pt is easily distracted and demonstrated difficulty sustaining to task. Sit <> stand from low EOB and pt ambulated 10 ft w/no AD and mod A for trunk support due to history of scoliosis and multimodal cues for guidance, as pt has severe R side inattention 2/2 impaired vision on R side. Provided cues for pt to turn head to R while ambulating to increase visual field, pt able to perform but unable to remember. Sit <>stand from low EOB and pt ambulated >200' w/RW and min A, multimodal for safe use of AD, directions and turning head to R. Noted significant kyphotic posture, narrow BOS and shuffling gait. Stand <>sit on mat w/min A for assistance turning and hip guidance, as pt wants to sit on corner of mat. Sit <> stand from mat and pt  ambulated >200' back to room w/RW and min  A. Pt performed bed mobility w/min A and was left supine in bed, all needs in reach. All family questions addressed and safety plan updated.  Mobility Bed Mobility Bed Mobility: Rolling Right;Rolling Left;Supine to Sit;Sitting - Scoot to [employer] of Bed;Scooting to [facility] Rolling Right: Minimal Assistance - Patient > 75% Rolling Left: Minimal Assistance - Patient > 75% Supine to Sit: Minimal Assistance - Patient > 75% Sitting - Scoot to [employer] of Bed: Minimal Assistance - Patient > 75% Sit to Supine: Minimal Assistance - Patient > 75% Scooting to HOB: Minimal Assistance - Patient > 75% Transfers Transfers: Sit to Stand;Stand to [employer] Transfers Sit to Stand: Minimal Assistance - Patient > 75% Stand to Sit: Minimal Assistance - Patient > 75% Stand Pivot Transfers: Minimal Assistance - Patient > 75% Stand Pivot Transfer Details: Visual cues for safe use of DME/AE;Verbal cues for safe use of DME/AE;Verbal cues for precautions/safety Stand Pivot Transfer Details (indicate cue type and reason): Poor peripheral vision on R side and impaired short term memory, cues to maintain safety w/AD and avoid obstacles Transfer (Assistive device): Rolling walker Locomotion  Gait Ambulation: Yes Gait Assistance: Minimal Assistance - Patient > 75% Gait Distance (Feet): 180 Feet Assistive device: Rolling walker Gait Assistance Details: Verbal cues for technique;Verbal cues for safe use of DME/AE;Visual cues for safe use of DME/AE;Tactile cues for posture;Manual facilitation for weight shifting Gait Assistance Details: R side inattention requiring multimodal cues to turn head to avoid obstacles and safe management of AD Gait Gait: Yes Gait Pattern: Impaired Gait Pattern: Trunk flexed;Decreased step length - left;Decreased step length - right;Decreased dorsiflexion - left;Decreased dorsiflexion - right;Narrow base of support;Poor foot clearance - left;Poor foot  clearance - right Gait velocity: decreased Stairs / Additional Locomotion Stairs: No Wheelchair Mobility Wheelchair Mobility: No   Discharge Criteria: Patient will be discharged from PT if patient refuses treatment 3 consecutive times without medical reason, if treatment goals not met, if there is a change in medical status, if patient makes no progress towards goals or if patient is discharged from hospital.  The above assessment, treatment plan, treatment alternatives and goals were discussed and mutually agreed upon: by patient and by family  Cruzita Lederer Emani Morad, PT, DPT 06/25/2021, 12:46 PM

## 2021-06-25 NOTE — Progress Notes (Signed)
Inpatient Rehabilitation Center Individual Statement of Services  Patient Name:  Caroline [name]  Date:  06/25/2021  Welcome to the Inpatient Rehabilitation Center.  Our goal is to provide you with an individualized program based on your diagnosis and situation, designed to meet your specific needs.  With this comprehensive rehabilitation program, you will be expected to participate in at least 3 hours of rehabilitation therapies Monday-Friday, with modified therapy programming on the weekends.  Your rehabilitation program will include the following services:  Physical Therapy (PT), Occupational Therapy (OT), Speech Therapy (ST), 24 hour per day rehabilitation nursing, Therapeutic Recreaction (TR), Neuropsychology, Care Coordinator, Rehabilitation Medicine, Nutrition Services, Pharmacy Services, and Other  Weekly team conferences will be held on Wednesday to discuss your progress.  Your Inpatient Rehabilitation Care Coordinator will talk with you frequently to get your input and to update you on team discussions.  Team conferences with you and your family in attendance may also be held.  Expected length of stay:  8-10 Days  Overall anticipated outcome:  Supervision to Min A  Depending on your progress and recovery, your program may change. Your Inpatient Rehabilitation Care Coordinator will coordinate services and will keep you informed of any changes. Your Inpatient Rehabilitation Care Coordinator's name and contact numbers are listed  below.  The following services may also be recommended but are not provided by the Inpatient Rehabilitation Center:   Home Health Rehabiltiation Services Outpatient Rehabilitation Services    Arrangements will be made to provide these services after discharge if needed.  Arrangements include referral to agencies that provide these services.  Your insurance has been verified to be:  [facility] Your primary doctor is:  Tisovec, Gerlene Burdock, MD  Pertinent information  will be shared with your doctor and your insurance company.  Inpatient Rehabilitation Care Coordinator:  Lavera Guise, [place] [phone] or [phone]  Information discussed with and copy given to patient by: Andria Rhein, 06/25/2021, 11:42 AM

## 2021-06-25 NOTE — Evaluation (Signed)
Speech Language Pathology Assessment and Plan  Patient Details  Name: Caroline [name] MRN: [id number] Date of Birth: [birth year]  SLP Diagnosis: Cognitive Impairments  Rehab Potential: Good ELOS: 10-14 days   Today's Date: 06/25/2021 SLP Individual Time: 1100-1209 SLP Individual Time Calculation (min): 62 min  Hospital Problem: Active Problems:   CVA (cerebral vascular accident) [name])  Past Medical History:  Past Medical History:  Diagnosis Date   Allergy    Anxiety    Arthritis    Cataract    Celiac sprue    CVA (cerebral vascular accident) ([place]) 02/2018   R thalamic CVA   DVT (deep vein thrombosis) in pregnancy    GERD (gastroesophageal reflux disease)    Glaucoma    Hormone replacement therapy (HRT)    Hypercholesteremia    Hyperlipidemia    Hypertension    Hypoglycemia    Hypothyroidism    OAB (overactive bladder)    Osteoporosis    PONV (postoperative nausea and vomiting)    Raynaud's disease    Scoliosis    Seasonal allergies    Senile purpura ([place])    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 Cheek   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   Bridge Left Nose, and Left Neck   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 Cheek Tx: curret x 3 and 5FU   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   Bridge of nose tx Cx3 and 5FU, Left neck Tx Cx3 and 5FU   Thrombophlebitis    Past Surgical History:  Past Surgical History:  Procedure Laterality Date   ABDOMINAL HYSTERECTOMY  1979   CATARACT EXTRACTION, BILATERAL     knee surgery Left    VARICOSE VEIN SURGERY Left    XI ROBOTIC ASSISTED PARAESOPHAGEAL HERNIA REPAIR N/A 06/15/2021   Procedure: XI ROBOTIC ASSISTED PARAESOPHAGEAL HERNIA REPAIR, GASTROPEXY, UPPER ENDOSCOPY;  Surgeon: Clovis Riley, MD;  Location: [facility];  Service: General;  Laterality: N/A;    Assessment / Plan / Recommendation Clinical Impression HPI: Caroline [name] is an 85 year old right-handed  female history of prior right thalamic 2019 CVA maintained on Plavix, mild cognitive impairment, chronic back pain, history of DVT, paraesophageal hernia followed by GI/general surgery, hypertension, hyperlipidemia. Patient lives with spouse. Two-level home bed and bath main level with one-step to entry. Reportedly independent prior to admission able to perform her own ADLs although she did need supervision assist.  Presented to [facility] on [facility]15/2021 with intermittent heartburn and episodes of severe regurgitation.  She had an endoscopy in February 2020 confirming grade D reflux esophagitis and again June 2020 with little improvement in endoscopic findings on high-dose PPI. Follow-up general surgery patient underwent robotic paraesophageal hernia repair, gastropexy, upper endoscopy 06/15/2021 per Dr. Kae Heller.  Her diet was slowly advanced. Post procedure noted somnolence increased disorientation. CT/MRI showed large acute left PCA territory infarct without hemorrhage or mass-effect. MRA showed occlusion of the left PCA at the distal P2 segment.  Patient presents with significant cognitive impairment characterized by decreased orientation, attention, immediate/short-term memory, problem solving, intellectual awareness, and right visual field impairment. She was known to ask the same questions repeatedly with minimal awareness or recall that it was previously discussed (e.g., whether or not she should drive) suggestive of poor insight into deficits. Pt was accompanied by her spouse and daughter who endorsed cognitive deficits at baseline s/p CVA in 2019 including reduced short-term recall and supervision to complete ADLs/iADLs. Spouse managed bill  paying and would fill weekly pillbox but noted pt would skip days or take them out of order. Family feels pt's current functioning is below baseline particularly with short-term memory.   [employer] Mental Status (SLUMS) administered: 8/30 [facility] =  27+ based on patient's educational hx). See results below.  Orientation: 1/3  Word list recall: 0/5 Calculations: 1/3 Generative Naming: 1/3 Mental manipulation/digit reversal: 2/2 Clock Drawing/Executive Function: 0/4 Visuospatial: 2/2 Paragraph Recall: 2/8  Patient's auditory comprehension and verbal expression appeared grossly intact. Motor speech production was clear without indication or concerns for dysarthria.   Pt currently consuming a pureed diet from a GI standpoint s/p recent surgery. Observed consuming noon meal including pureed textures and thin liquids with functional oropharyngeal swallow and without overt s/sx of aspiration. Diet progression to be determined as appropriate by GI team/MD. Further assessment of swallow not indicated by SLP at this time.   Pt would benefit from skilled SLP intervention to maximize cognitive function and independence prior to discharge.   Skilled Therapeutic Interventions          Pt participated in [place] Status Examination (SLUMS) as well as further non-standardized assessments of cognitive-linguistic, speech, and language function. Please see above.    SLP Assessment  Patient will need skilled [place] [facility] during CIR admission    Recommendations  Patient destination: Home Follow up Recommendations: 24 hour supervision/assistance;Home Health SLP Equipment Recommended: None recommended by SLP    SLP Frequency 3 to 5 out of 7 days   SLP Duration  SLP Intensity  SLP Treatment/Interventions 10-14 days  Minumum of 1-2 x/day, 30 to 90 minutes  Cognitive remediation/compensation;Environmental controls;Internal/external aids;Cueing hierarchy;Functional tasks;Patient/family education    Pain  No pain  Prior Functioning Cognitive/Linguistic Baseline: Baseline deficits Baseline deficit details: family endorsed mild memory impairment. Spouse also endorsed she would "embellish" stories or share  information that was not factual Type of Home: House  Lives With: Spouse Available Help at Discharge: Family;Available PRN/intermittently  SLP Evaluation Cognition Overall Cognitive Status: Impaired/Different from baseline Arousal/Alertness: Awake/alert Orientation Level: Oriented to person;Disoriented to time;Disoriented to situation;Disoriented to place Year: Other (Comment) (2025) Month: October Day of Week: Incorrect Attention: Selective Focused Attention: Appears intact Sustained Attention: Appears intact Selective Attention: Impaired Selective Attention Impairment: Verbal basic Memory: Impaired Memory Impairment: Storage deficit;Retrieval deficit;Decreased recall of new information;Decreased short term memory Decreased Long Term Memory: Verbal basic Decreased Short Term Memory: Verbal basic;Functional basic Awareness: Impaired Awareness Impairment: Intellectual impairment Problem Solving: Impaired Problem Solving Impairment: Functional basic;Verbal basic Executive Function: Sequencing;Organizing;Self Monitoring;Self Correcting;Reasoning Reasoning: Impaired Reasoning Impairment: Verbal basic Sequencing: Impaired Sequencing Impairment: Verbal basic Organizing: Impaired Organizing Impairment: Verbal basic Self Monitoring: Impaired Self Monitoring Impairment: Verbal basic;Functional basic Self Correcting: Impaired Self Correcting Impairment: Verbal basic;Functional basic Behaviors: Perseveration Safety/Judgment: Impaired  Comprehension Auditory Comprehension Overall Auditory Comprehension: Appears within functional limits for tasks assessed Expression Expression Primary Mode of Expression: Verbal Verbal Expression Overall Verbal Expression: Appears within functional limits for tasks assessed Oral Motor Oral Motor/Sensory Function Overall Oral Motor/Sensory Function: Within functional limits Motor Speech Overall Motor Speech: Appears within functional limits for  tasks assessed  Care Tool Care Tool Cognition Ability to hear (with hearing aid or hearing appliances if normally used Ability to hear (with hearing aid or hearing appliances if normally used): 1. Minimal difficulty - difficulty in some environments (e.g. when person speaks softly or setting is noisy)   Expression of Ideas and Wants Expression of Ideas and Wants: 3. Some  difficulty - exhibits some difficulty with expressing needs and ideas (e.g, some words or finishing thoughts) or speech is not clear   Understanding Verbal and Non-Verbal Content Understanding Verbal and Non-Verbal Content: 3. Usually understands - understands most conversations, but misses some part/intent of message. Requires cues at times to understand  Memory/Recall Ability Memory/Recall Ability : That he or she is in a hospital/hospital unit   Bedside Swallowing Assessment General Diet Prior to this Study: Regular;Thin liquids (Pt consumed regular diet and thin liquids prior to surgery. Pt downgraded to puree textures following surgery from a GI standpoint) History of Recent Intubation: No Behavior/Cognition: Alert;Cooperative;Pleasant mood Oral Cavity - Dentition: Adequate natural dentition Self-Feeding Abilities: Able to feed self Patient Positioning: Upright in bed Baseline Vocal Quality: Normal Volitional Cough: Strong Volitional Swallow: Able to elicit  Oral Care Assessment Does patient have any of the following "high(er) risk" factors?: None of the above Does patient have any of the following "at risk" factors?: None of the above Patient is LOW RISK: Follow universal precautions (see row information) Thin Liquid Thin Liquid: Within functional limits Presentation: Straw Puree Puree: Within functional limits Presentation: Self Fed BSE Assessment Risk for Aspiration Impact on safety and function: Mild aspiration risk Other Related Risk Factors: Cognitive impairment;History of GERD;Previous CVA  Short Term  Goals: Week 1: SLP Short Term Goal 1 (Week 1): Patient will demonstrate orientation to person/place/time/situation with mod A verbal cues to utilize external aids SLP Short Term Goal 2 (Week 1): Patient will demonstrate improved intellectual awareness by identifying at least 2 cognitive and 2 physical changes with mod A verbal cues SLP Short Term Goal 3 (Week 1): Patient will recall and demonstrate use of call bell to request assist for help with mod A verbal cues SLP Short Term Goal 4 (Week 1): Patient will use a memory book to respond to questions about events and give information with mod A verbal cues.  Refer to Care Plan for Long Term Goals  Recommendations for other services: None   Discharge Criteria: Patient will be discharged from SLP if patient refuses treatment 3 consecutive times without medical reason, if treatment goals not met, if there is a change in medical status, if patient makes no progress towards goals or if patient is discharged from hospital.  The above assessment, treatment plan, treatment alternatives and goals were discussed and mutually agreed upon: by patient  Patty Sermons 06/25/2021, 4:57 PM

## 2021-06-25 NOTE — Progress Notes (Signed)
Inpatient Rehabilitation  Patient information reviewed and entered into eRehab system by Rayna Brenner M. Gertude Benito, M.A., CCC/SLP, PPS Coordinator.  Information including medical coding, functional ability and quality indicators will be reviewed and updated through discharge.    

## 2021-06-25 NOTE — Plan of Care (Signed)
  Problem: Sit to Stand Goal: LTG:  Patient will perform sit to stand with assistance level (PT) Description: LTG:  Patient will perform sit to stand with assistance level (PT) Flowsheets (Taken 06/25/2021 1653) LTG: PT will perform sit to stand in preparation for functional mobility with assistance level: (LRAD) Supervision/Verbal cueing   Problem: RH Bed Mobility Goal: LTG Patient will perform bed mobility with assist (PT) Description: LTG: Patient will perform bed mobility with assistance, with/without cues (PT). Flowsheets (Taken 06/25/2021 1653) LTG: Pt will perform bed mobility with assistance level of: Supervision/Verbal cueing   Problem: RH Bed to Chair Transfers Goal: LTG Patient will perform bed/chair transfers w/assist (PT) Description: LTG: Patient will perform bed to chair transfers with assistance (PT). Flowsheets (Taken 06/25/2021 1653) LTG: Pt will perform Bed to Chair Transfers with assistance level: (LRAD) Supervision/Verbal cueing   Problem: RH Car Transfers Goal: LTG Patient will perform car transfers with assist (PT) Description: LTG: Patient will perform car transfers with assistance (PT). Flowsheets (Taken 06/25/2021 1653) LTG: Pt will perform car transfers with assist:: (LRAD) Supervision/Verbal cueing   Problem: RH Ambulation Goal: LTG Patient will ambulate in controlled environment (PT) Description: LTG: Patient will ambulate in a controlled environment, # of feet with assistance (PT). Flowsheets (Taken 06/25/2021 1653) LTG: Pt will ambulate in controlled environ  assist needed:: (LRAD) Supervision/Verbal cueing LTG: Ambulation distance in controlled environment: 150' Goal: LTG Patient will ambulate in home environment (PT) Description: LTG: Patient will ambulate in home environment, # of feet with assistance (PT). Flowsheets (Taken 06/25/2021 1653) LTG: Pt will ambulate in home environ  assist needed:: (LRAD) Supervision/Verbal cueing LTG: Ambulation distance in  home environment: 50'   Problem: RH Stairs Goal: LTG Patient will ambulate up and down stairs w/assist (PT) Description: LTG: Patient will ambulate up and down # of stairs with assistance (PT) Flowsheets (Taken 06/25/2021 1653) LTG: Pt will ambulate up/down stairs assist needed:: (No rail) Minimal Assistance - Patient > 75% LTG: Pt will  ambulate up and down number of stairs: 4

## 2021-06-25 NOTE — H&P (Addendum)
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  History taken from chart review patient and husband.  Patient lives with spouse.  Two-level home bed and bath main level with one-step to entry.  Reportedly independent prior to admission able to perform her own ADLs although she did need supervision assist.  Presented to [facility] on 06/15/2021 with intermittent heartburn and episodes of severe regurgitation.  She had an endoscopy in February 2020 confirming grade D reflux esophagitis and again June 2020 with little improvement in endoscopic findings on high-dose PPI.  Biopsies were negative.  She had a normal gastric emptying study in 2020.  Follow-up general surgery patient underwent robotic paraesophageal hernia repair, gastropexy, upper endoscopy 06/15/2021 per Dr. Kae Heller.  Her diet was slowly advanced.  Post procedure noted somnolence increased disorientation.  CT/MRI showed large acute left PCA territory infarct without hemorrhage or mass-effect.  Patient did not receive tPA.  MRA showed occlusion of the left PCA at the distal P2 segment.  Severe atrophy and chronic small vessel ischemia.  Echocardiogram with ejection fraction of 60 to 78%, grade 1 diastolic dysfunction. Neurology follow-up maintained on aspirin as well as Plavix x21 days then Plavix alone.  Lovenox for DVT prophylaxis.  Preliminary urine culture 06/22/2021 greater 100,000 gram-negative rods currently maintained on IV Rocephin.  Therapy evaluations completed due weakness, limitations in self-care after recent GI procedure was admitted for a comprehensive rehab program. Please see preadmission assessment from earlier today as well.   Review of Systems   Constitutional:        Decreased p.o. intake  HENT:  Negative for hearing loss.   Eyes:  Negative for blurred vision and double vision.  Respiratory:  Negative for cough and shortness of breath.   Cardiovascular:  Negative for chest pain, palpitations and leg swelling.  Gastrointestinal:  Positive for heartburn. Negative for nausea.       Intermittent bouts of regurgitation  Genitourinary:  Negative for dysuria, flank pain and hematuria.  Musculoskeletal:  Negative for joint pain and myalgias.  Skin:  Negative for rash.  Neurological:  Positive for weakness.  Psychiatric/Behavioral:  Positive for memory loss.        Anxiety  All other systems reviewed and are negative. 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 Cheek   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   Bridge Left Nose, and Left Neck   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 Cheek Tx: curret x 3 and 5FU   Squamous  cell carcinoma in situ (SCCIS) 08/02/2018   Bridge of nose tx Cx3 and 5FU, Left neck Tx Cx3 and 5FU   Thrombophlebitis    Past Surgical History:  Procedure Laterality Date   ABDOMINAL HYSTERECTOMY  1979   CATARACT EXTRACTION, BILATERAL     knee surgery Left    VARICOSE VEIN SURGERY Left    XI ROBOTIC ASSISTED PARAESOPHAGEAL HERNIA REPAIR N/A 06/15/2021   Procedure: XI ROBOTIC ASSISTED  PARAESOPHAGEAL HERNIA REPAIR, GASTROPEXY, UPPER ENDOSCOPY;  Surgeon: Clovis Riley, MD;  Location: [facility];  Service: General;  Laterality: N/A;   Family History  Problem Relation Age of Onset   Arthritis Mother        pt denies   Hypertension Mother        pt denies   Obesity Sister    Cancer Maternal Uncle        type unknown   Colon cancer Neg Hx    Stomach cancer Neg Hx    Esophageal cancer Neg Hx    Pancreatic cancer Neg Hx    Social History:  reports that she has never smoked. She has never used smokeless tobacco. She reports that she does not currently use drugs. She reports that she does not drink alcohol. Allergies:  Allergies  Allergen Reactions   Gluten Meal Nausea And Vomiting    Patient has CELIAC DISEASE   Wheat Bran Nausea And Vomiting    Patient has CELIAC DISEASE   Adhesive [Tape] Other (See Comments)    Patient's skin is VERY THIN and tears very easily; please use either paper tape or Coban wrap   Codeine Nausea And Vomiting   Other Other (See Comments)    Patient has Hypoglycemia; her husband stated "when her sugar crashes, it crashes hard."   Penicillins Other (See Comments)    Doesn't remember reaction , but denies any SOB/Swelling States " it was a long time ago "    Medications Prior to Admission  Medication Sig Dispense Refill   acetaminophen (TYLENOL) 650 MG CR tablet Take 1,300 mg by mouth every 8 (eight) hours as needed for pain.     Ascorbic Acid (VITAMIN C) 100 MG tablet Take 100 mg by mouth every other day.      atorvastatin (LIPITOR) 40 MG tablet TAKE 1 TABLET (40 MG TOTAL) BY MOUTH DAILY. 30 tablet 3   Cholecalciferol (VITAMIN D-3) 1000 units CAPS Take 1,000 Units by mouth daily.     Coenzyme Q10 (COQ10 PO) Take 1 tablet by mouth daily.     hydrochlorothiazide (MICROZIDE) 12.5 MG capsule Take 12.5 mg by mouth daily as needed (fluid).     irbesartan (AVAPRO) 150 MG tablet Take 150 mg by mouth daily.      levothyroxine (SYNTHROID) 112 MCG  tablet Take 112 mcg by mouth daily.     Omega-3 Fatty Acids (FISH OIL PO) Take 1,000 mg by mouth daily.     ondansetron (ZOFRAN-ODT) 4 MG disintegrating tablet Take 4 mg by mouth every 8 (eight) hours as needed for nausea or vomiting.     pantoprazole (PROTONIX) 40 MG tablet TAKE 1 TABLET BY MOUTH 2 TIMES DAILY BEFORE A MEAL. (Patient taking differently: Take 40 mg by mouth 2 (two) times daily before a meal.) 180 tablet 1   Potassium 99 MG TABS Take 99 mg by mouth daily.     simethicone (MYLICON) 119 MG chewable tablet Chew 125 mg by mouth every 6 (six) hours as needed for flatulence.  clopidogrel (PLAVIX) 75 MG tablet Take 75 mg by mouth daily. (Patient not taking: No sig reported)     traMADol (ULTRAM) 50 MG tablet Take 25 mg by mouth every 6 (six) hours as needed for moderate pain. (Patient not taking: No sig reported)      Drug Regimen Review Drug regimen was reviewed and remains appropriate with no significant issues identified  Home: Home Living Family/patient expects to be discharged to:: Private residence Living Arrangements: Spouse/significant other Available Help at Discharge: Family, Available PRN/intermittently Type of Home: House Home Access: Stairs to enter [employer] of Steps: 3 steps in front; 1 step in garage Entrance Stairs-Rails: None Home Layout: Two level, Able to live on main level with bedroom/bathroom Bathroom Shower/Tub: Multimedia programmer: Standard Bathroom Accessibility: Yes Home Equipment: Environmental consultant - 2 wheels, [place] - single point  Lives With: Spouse   Functional History: Prior Function Level of Independence: Independent Comments: typically ambulatory at home without assistive device, able to perform ADLs  Functional Status:  Mobility: Bed Mobility Overal bed mobility: Needs Assistance Bed Mobility: Supine to Sit Supine to sit: Supervision Sit to supine: Mod assist General bed mobility comments: extra time, cues to initiate  activity and cues  to complete, pt asks  multiplee times what she should be doing. Transfers Overall transfer level: Needs assistance Equipment used: Rolling walker (2 wheeled) Transfers: Sit to/from Stand Sit to Stand: Min assist Stand pivot transfers: Min guard General transfer comment: light min A to power up to standing from bed and  toilet. cues to use rail. Posture appears more flexed today. noted significant scoliosis Ambulation/Gait Ambulation/Gait assistance: Min assist Gait Distance (Feet): 120 Feet Assistive device: Rolling walker (2 wheeled) Gait Pattern/deviations: Step-to pattern, Trunk flexed General Gait Details: constant cues for right visual deficits, assist with turning RW, keeping in straight direction. Frequent cues to catch up to RW and stand more erect, look ahead. Patient required mutiple cues to find chair on her right. Gait velocity: decreased    ADL: ADL Overall ADL's : Needs assistance/impaired Grooming: Wash/dry hands, Minimal assistance, Standing Grooming Details (indicate cue type and reason): to reach for soap dispenser with R hand due to difficulty with visual scanning Upper Body Bathing: Supervision/ safety, Sitting Upper Body Bathing Details (indicate cue type and reason): ' Lower Body Bathing: Moderate assistance, Sitting/lateral leans, Sit to/from stand Upper Body Dressing : Supervision/safety, Sitting Lower Body Dressing: Min guard, Sit to/from stand Lower Body Dressing Details (indicate cue type and reason): patient needing increased time to doff soiled briefs and don clean pair due to visual deficits, min G in standing for safety Toilet Transfer: Minimal assistance, Cueing for safety, Cueing for sequencing, Ambulation, Regular Toilet, RW, Grab bars Toilet Transfer Details (indicate cue type and reason): needs cues for proper hand placement especially on R side to hold proper place on walker frame due to difficulty visually scanning needing  multimodal cues. light min A to power up to standing from toilet. Toileting- Water quality scientist and Hygiene: Min guard, Sit to/from stand, Sitting/lateral lean Toileting - Clothing Manipulation Details (indicate cue type and reason): patient able to perform peri care after voiding in standing, min G for safety Functional mobility during ADLs: Minimal assistance, Cueing for safety, Cueing for sequencing, Rolling walker General ADL Comments: continues to have difficulty safely navigating her environment with walker due to visual deficits needing multimodal cues for scanning  Cognition: Cognition Overall Cognitive Status: Impaired/Different from baseline Arousal/Alertness: Awake/alert Orientation Level: Oriented to person Attention: Focused,  Sustained Focused Attention: Appears intact Sustained Attention: Appears intact Memory: Impaired Memory Impairment: Storage deficit, Retrieval deficit, Decreased long term memory, Decreased short term memory Decreased Long Term Memory: Verbal basic Decreased Short Term Memory: Verbal basic Awareness: Impaired Awareness Impairment: Intellectual impairment, Emergent impairment Problem Solving: Impaired Problem Solving Impairment: Verbal basic, Functional basic Safety/Judgment: Impaired Cognition Arousal/Alertness: Awake/alert Behavior During Therapy: WFL for tasks assessed/performed Overall Cognitive Status: Impaired/Different from baseline Area of Impairment: Orientation Orientation Level: Disoriented to, Place, Time Current Attention Level: Sustained Memory: Decreased short-term memory Following Commands: Follows one step commands with increased time Safety/Judgement: Decreased awareness of safety, Decreased awareness of deficits Awareness: Emergent Problem Solving: Requires verbal cues, Requires tactile cues General Comments: neighbor in to visit, repeated several times that her neighbor was visiting and if I had met her.  Physical  Exam: Blood pressure (!) 148/64, pulse 78, temperature 98 F (36.7 C), temperature source Oral, resp. rate 20, height _0  (1.626 m), weight 63.1 kg, SpO2 95 %. Physical Exam Vitals reviewed.  Constitutional:      Appearance: Normal appearance. She is normal weight.  HENT:     Head: Normocephalic and atraumatic.     Nose: Nose normal.  Eyes:     General:        Right eye: No discharge.        Left eye: No discharge.     Extraocular Movements: Extraocular movements intact.  Cardiovascular:     Rate and Rhythm: Normal rate and regular rhythm.  Pulmonary:     Effort: Pulmonary effort is normal. No respiratory distress.     Breath sounds: No stridor.  Abdominal:     General: Abdomen is flat. Bowel sounds are normal. There is no distension.  Musculoskeletal:     Cervical back: Normal range of motion and neck supple.     Comments: No edema or tenderness in extremities  Skin:    General: Skin is warm and dry.  Neurological:     Mental Status: She is alert.     Comments: Alert Makes eye contact with examiner.   Follows simple commands.   Provides name place and month but needed multiple cues for year and situation. Motor: RUE: 4+/5 proximal to distal RLE: 4/5 proximal to distal LUE/LLE: 5/5 proximal to distal  Psychiatric:        Mood and Affect: Mood normal.        Behavior: Behavior normal.        Thought Content: Thought content normal.    Results for orders placed or performed during the hospital encounter of 06/15/21 (from the past 48 hour(s))  CBC with Differential/Platelet     Status: Abnormal   Collection Time: 06/22/21 10:18 AM  Result Value Ref Range   WBC 7.1 4.0 - 10.5 K/uL   RBC 4.02 3.87 - 5.11 MIL/uL   Hemoglobin 11.6 (L) 12.0 - 15.0 g/dL   HCT 35.6 (L) 36.0 - 46.0 %   MCV 88.6 80.0 - 100.0 fL   MCH 28.9 26.0 - 34.0 pg   MCHC 32.6 30.0 - 36.0 g/dL   RDW 16.4 (H) 11.5 - 15.5 %   Platelets 298 150 - 400 K/uL   nRBC 0.0 0.0 - 0.2 %   Neutrophils Relative %  65 %   Neutro Abs 4.7 1.7 - 7.7 K/uL   Lymphocytes Relative 19 %   Lymphs Abs 1.4 0.7 - 4.0 K/uL   Monocytes Relative 10 %   Monocytes Absolute 0.7 0.1 - 1.0  K/uL   Eosinophils Relative 5 %   Eosinophils Absolute 0.3 0.0 - 0.5 K/uL   Basophils Relative 1 %   Basophils Absolute 0.0 0.0 - 0.1 K/uL   Immature Granulocytes 0 %   Abs Immature Granulocytes 0.02 0.00 - 0.07 K/uL    Comment: Performed at [facility], [place] [address] [place], [place] [address]  TSH     Status: Abnormal   Collection Time: 06/22/21 10:18 AM  Result Value Ref Range   TSH 8.036 (H) 0.350 - 4.500 uIU/mL    Comment: Performed by a 3rd Generation assay with a functional sensitivity of <=0.01 uIU/mL. Performed at [facility], [place] [address], [place], [place] [address]   Vitamin B12     Status: None   Collection Time: 06/22/21 10:18 AM  Result Value Ref Range   Vitamin B-12 526 180 - 914 pg/mL    Comment: (NOTE) This assay is not validated for testing neonatal or myeloproliferative syndrome specimens for Vitamin B12 levels. Performed at [facility], [place] [address], [place], [place] [address]   T4, free     Status: None   Collection Time: 06/22/21 10:18 AM  Result Value Ref Range   Free T4 0.99 0.61 - 1.12 ng/dL    Comment: (NOTE) Biotin ingestion may interfere with free T4 tests. If the results are inconsistent with the TSH level, previous test results, or the clinical presentation, then consider biotin interference. If needed, order repeat testing after stopping biotin. Performed at [facility], [place] [address], [place], [place] [address]   T3, free     Status: Abnormal   Collection Time: 06/22/21 10:18 AM  Result Value Ref Range   T3, Free 1.3 (L) 2.0 - 4.4 pg/mL    Comment: (NOTE) Performed At: [facility] [place], [place] [id number] Rush Farmer MD HW:[id number]   Urinalysis, Routine w reflex microscopic  Urine, Catheterized     Status: Abnormal   Collection Time: 06/22/21  1:34 PM  Result Value Ref Range   Color, Urine YELLOW YELLOW   APPearance HAZY (A) CLEAR   Specific Gravity, Urine 1.020 1.005 - 1.030   pH 6.0 5.0 - 8.0   Glucose, UA NEGATIVE NEGATIVE mg/dL   Hgb urine dipstick TRACE (A) NEGATIVE   Bilirubin Urine NEGATIVE NEGATIVE   Ketones, ur NEGATIVE NEGATIVE mg/dL   Protein, ur NEGATIVE NEGATIVE mg/dL   Nitrite POSITIVE (A) NEGATIVE   Leukocytes,Ua MODERATE (A) NEGATIVE   RBC / HPF 0-5 0 - 5 RBC/hpf   WBC, UA 21-50 0 - 5 WBC/hpf   Bacteria, UA MANY (A) NONE SEEN   Mucus PRESENT     Comment: Performed at [facility], [place] [address] [place], [place] [address]  Urine Culture     Status: Abnormal (Preliminary result)   Collection Time: 06/22/21  4:27 PM   Specimen: Urine, Catheterized  Result Value Ref Range   Specimen Description      URINE, CATHETERIZED Performed at [facility] [place] [address], [place], [place] [address]    Special Requests      NONE Performed at [facility], [place] [address], [place], [place] [address]    Culture (A)     >=100,000 COLONIES/mL GRAM NEGATIVE RODS SUSCEPTIBILITIES TO FOLLOW Performed at [facility], [place] [address], [place],  [address]    Report Status PENDING   Comprehensive metabolic panel  Status: Abnormal   Collection Time: 06/23/21  5:15 AM  Result Value Ref Range   Sodium 140 135 - 145 mmol/L   Potassium 3.8 3.5 - 5.1 mmol/L   Chloride 106 98 - 111 mmol/L   CO2 27 22 - 32 mmol/L   Glucose, Bld 92 70 - 99 mg/dL    Comment: Glucose reference range applies only to samples taken after fasting for at least 8 hours.   BUN 15 8 - 23 mg/dL   Creatinine, Ser 0.62 0.44 - 1.00 mg/dL   Calcium 8.6 (L) 8.9 - 10.3 mg/dL   Total Protein 5.9 (L) 6.5 - 8.1 g/dL   Albumin 3.2 (L) 3.5 - 5.0 g/dL   AST 27 15 - 41 U/L   ALT 23 0 - 44 U/L   Alkaline Phosphatase 80 38  - 126 U/L   Total Bilirubin 0.4 0.3 - 1.2 mg/dL   GFR, Estimated >60 >60 mL/min    Comment: (NOTE) Calculated using the CKD-EPI Creatinine Equation (2021)    Anion gap 7 5 - 15    Comment: Performed at [facility], [place] [address] [place], [place] [address]  CBC     Status: Abnormal   Collection Time: 06/23/21  5:15 AM  Result Value Ref Range   WBC 6.5 4.0 - 10.5 K/uL   RBC 4.04 3.87 - 5.11 MIL/uL   Hemoglobin 11.4 (L) 12.0 - 15.0 g/dL   HCT 35.4 (L) 36.0 - 46.0 %   MCV 87.6 80.0 - 100.0 fL   MCH 28.2 26.0 - 34.0 pg   MCHC 32.2 30.0 - 36.0 g/dL   RDW 16.5 (H) 11.5 - 15.5 %   Platelets 281 150 - 400 K/uL   nRBC 0.0 0.0 - 0.2 %    Comment: Performed at [facility], [place] [address] [place], [place] [address]  Magnesium     Status: None   Collection Time: 06/23/21  5:15 AM  Result Value Ref Range   Magnesium 2.2 1.7 - 2.4 mg/dL    Comment: Performed at [facility], [place] [address] [place], [place] [address]   CT HEAD WO CONTRAST (5MM)  Result Date: 06/22/2021 CLINICAL DATA:  Follow-up stroke EXAM: CT HEAD WITHOUT CONTRAST TECHNIQUE: Contiguous axial images were obtained from the base of the skull through the vertex without intravenous contrast. COMPARISON:  CT and MRI studies 06/18/2021 FINDINGS: Brain: No focal abnormality affects the brainstem or cerebellum. Acute infarction in the left PCA territory affecting the posteromedial temporal lobe, occipital lobe and left thalamus as seen previously, with low-density and swelling. No hemorrhage. Old ischemic changes of the thalami, basal ganglia and cerebral hemispheric white matter. No hydrocephalus or extra-axial collection. Vascular: There is atherosclerotic calcification of the major vessels at the base of the brain. Skull: Negative Sinuses/Orbits: Clear/normal Other: None IMPRESSION: Acute infarction affecting the left PCA territory as described above and as seen previously. No  new or progressive finding. No hemorrhagic transformation. Extensive small-vessel ischemic changes elsewhere affecting the thalami, basal ganglia and hemispheric white matter. Electronically Signed   By: Nelson Chimes M.D.   On: 06/22/2021 11:11       Medical Problem List and Plan: 1.  Decreased functional mobility with gait disturbance secondary to large acute left posterior cerebral artery infarction after robotic paraesophageal hernia repair, gastropexy, upper endoscopy 06/15/2021 as well as history of right thalamic CVA 2019  -patient may shower  -ELOS/Goals: 6-9 days/Supervision  Admit to CIR 2.  Antithrombotics: -DVT/anticoagulation:  Pharmaceutical: Lovenox  -antiplatelet therapy: Aspirin 81 mg daily and Plavix and 5 mg day x3 weeks then Plavix alone 3. Pain Management: Tylenol as needed 4. Mood history of cognitive impairment:   -antipsychotic agents: Seroquel 25 mg nightly 5. Neuropsych: This patient is not fully capable of making decisions on her own behalf. 6. Skin/Wound Care: Routine skin checks 7. Fluids/Electrolytes/Nutrition: Routine in and outs CMP ordered for tomorrow 8.  Hypertension.  Avapro 150 mg daily.   Monitor with increased mobility 9.  Hyperlipidemia.  Lipitor 80 10.  Gram-negative rod UTI.  Complete course of Rocephin 11.  Hypothyroidism.  Synthroid   Lavon Paganini Angiulli, PA-C 06/24/2021  I have personally performed a face to face diagnostic evaluation, including, but not limited to relevant history and physical exam findings, of this patient and developed relevant assessment and plan.  Additionally, I have reviewed and concur with the physician assistant's documentation above.  Delice Lesch, MD, ABPMR  The patient's status has not changed. Any changes from the pre-admission screening or documentation from the acute chart are noted above.   Delice Lesch, MD, ABPMR

## 2021-06-25 NOTE — Progress Notes (Signed)
Patient ID: Caroline [name], female   DOB: Nov 23, 1935, 85 y.o.   MRN: [id number] Met with patient (sleeping) and daughter to introduce self and role. Briefly reviewed educational resources for secondary risk management. Daughter noted she and patient's spouse to review information and alert staff of need for additional information or questions. Continue to follow along to discharge to address educational needs and collaborate with the team to facilitate preparation for discharge. Margarito Liner

## 2021-06-26 DIAGNOSIS — E785 Hyperlipidemia, unspecified: Secondary | ICD-10-CM

## 2021-06-26 DIAGNOSIS — E8809 Other disorders of plasma-protein metabolism, not elsewhere classified: Secondary | ICD-10-CM | POA: Insufficient documentation

## 2021-06-26 DIAGNOSIS — I6300 Cerebral infarction due to thrombosis of unspecified precerebral artery: Secondary | ICD-10-CM

## 2021-06-26 DIAGNOSIS — I10 Essential (primary) hypertension: Secondary | ICD-10-CM

## 2021-06-26 DIAGNOSIS — N390 Urinary tract infection, site not specified: Secondary | ICD-10-CM

## 2021-06-26 DIAGNOSIS — E46 Unspecified protein-calorie malnutrition: Secondary | ICD-10-CM

## 2021-06-26 DIAGNOSIS — I69391 Dysphagia following cerebral infarction: Secondary | ICD-10-CM

## 2021-06-26 MED ORDER — PROSOURCE PLUS PO LIQD
30.0000 mL | Freq: Two times a day (BID) | ORAL | Status: DC
  Administered 2021-06-26 – 2021-07-06 (×19): 30 mL via ORAL
  Filled 2021-06-26 (×17): qty 30

## 2021-06-26 NOTE — IPOC Note (Signed)
Individualized overall Plan of Care [facility]) Patient Details Name: Caroline [name] MRN: [id number] DOB: [birth year]  Admitting Diagnosis: CVA (cerebral vascular accident) [facility])  Hospital Problems: Principal Problem:   CVA (cerebral vascular accident) (HCC) Active Problems:   Dysphagia, post-stroke   Hypoalbuminemia due to protein-calorie malnutrition (HCC)     Functional Problem List: Nursing Safety, Medication Management, Bladder, Bowel, Pain, Endurance, Nutrition  PT Endurance, Perception, Safety, Balance  OT Balance, Cognition, Endurance, Motor, Pain, Vision, Safety, Perception  SLP Cognition, Safety  TR         Basic ADL's: OT Eating, Grooming, Bathing, Dressing, Toileting     Advanced  ADL's: OT Simple Meal Preparation     Transfers: PT Bed to Chair, Car, Bed Mobility, Lobbyist, Technical brewer: PT Ambulation, Stairs     Additional Impairments: OT None  SLP Social Cognition   Problem Solving, Memory, Attention, Awareness  TR      Anticipated Outcomes Item Anticipated Outcome  Self Feeding independent  Swallowing      Basic self-care  supervision  Toileting  supervision   Bathroom Transfers supervision  Bowel/Bladder  Manage bowel and bladder w mod I assist  Transfers  Supervision  Locomotion  Supervision  Communication     Cognition  mod A  Pain  at or below level 4  Safety/Judgment  maintain w cues   Therapy Plan: PT Intensity: Minimum of 1-2 x/day ,45 to 90 minutes PT Frequency: 5 out of 7 days PT Duration Estimated Length of Stay: 10-14 days OT Intensity: Minimum of 1-2 x/day, 45 to 90 minutes OT Frequency: 5 out of 7 days OT Duration/Estimated Length of Stay: 10-14 days SLP Intensity: Minumum of 1-2 x/day, 30 to 90 minutes SLP Frequency: 3 to 5 out of 7 days SLP Duration/Estimated Length of Stay: 10-14 days    Team Interventions: Nursing Interventions Bladder Management, Disease Management/Prevention, Medication  Management, Discharge Planning, Pain Management, Bowel Management, Patient/Family Education, Dysphagia/Aspiration Precaution Training  PT interventions Ambulation/gait training, Community reintegration, Fish farm manager, Neuromuscular re-education, Psychosocial support, Stair training, UE/LE Strength taining/ROM, Wheelchair propulsion/positioning, Warden/ranger, Discharge planning, Functional electrical stimulation, Pain management, Skin care/wound management, Therapeutic Activities, UE/LE Coordination activities, Cognitive remediation/compensation, Disease management/prevention, Functional mobility training, Patient/family education, Splinting/orthotics, Visual/perceptual remediation/compensation, Therapeutic Exercise  OT Interventions Balance/vestibular training, Discharge planning, Self Care/advanced ADL retraining, Therapeutic Activities, UE/LE Coordination activities, Pain management, Cognitive remediation/compensation, Disease mangement/prevention, Functional mobility training, Patient/family education, Therapeutic Exercise, Visual/perceptual remediation/compensation, Firefighter, Fish farm manager, Neuromuscular re-education, UE/LE Strength taining/ROM, Wheelchair propulsion/positioning, Psychosocial support  SLP Interventions Cognitive remediation/compensation, Environmental controls, Internal/external aids, Financial trader, Functional tasks, Patient/family education  TR Interventions    SW/CM Interventions Discharge Planning, Psychosocial Support, Patient/Family Education, Disease Management/Prevention   Barriers to Discharge MD  Medical stability and Nutritional means  Nursing Decreased caregiver support, Home environment access/layout, Nutrition means 2 level 1 ste B+B on main w spouse  PT Decreased caregiver support, Home environment access/layout, Lack of/limited family support    OT      SLP Other (comments) none for SLP  SW  Lack of/limited family support     Team Discharge Planning: Destination: PT-Home ,OT- Home , SLP-Home Projected Follow-up: PT-Home health PT, OT-  Outpatient OT, 24 hour supervision/assistance, SLP-24 hour supervision/assistance, Home Health SLP Projected Equipment Needs: PT-To be determined, OT- None recommended by OT, SLP-None recommended by SLP Equipment Details: PT- , OT-  Patient/family involved in discharge planning: PT- Patient, Family member/caregiver,  OT-Patient, Family member/caregiver, SLP-Patient,  Family member/caregiver  MD ELOS: 10-14 days. 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  Patient lives with spouse.  Two-level home bed and bath main level with one-step to entry.  Reportedly independent prior to admission able to perform her own ADLs although she did need supervision assist.  Presented to [facility] long hospital on 06/15/2021 with intermittent heartburn and episodes of severe regurgitation.  She had an endoscopy in February 2020 confirming grade D reflux esophagitis and again June 2020 with little improvement in endoscopic findings on high-dose PPI.  Biopsies were negative.  She had a normal gastric emptying study in 2020.  Follow-up general surgery patient underwent robotic paraesophageal hernia repair, gastropexy, upper endoscopy 06/15/2021 per Dr. Fredricka Bonine.  Her diet was slowly advanced.  Post procedure noted somnolence increased disorientation.  CT/MRI showed large acute left PCA territory infarct without hemorrhage or mass-effect.  Patient did not receive tPA.  MRA showed occlusion of the left PCA at the distal P2 segment.  Severe atrophy and chronic small vessel ischemia.  Echocardiogram with ejection fraction of 60 to 65%, grade 1 diastolic  dysfunction. Neurology follow-up maintained on aspirin as well as Plavix x21 days then Plavix alone.  Preliminary urine culture 06/22/2021 greater 100,000 gram-negative rods currently maintained on IV Rocephin.  Therapy evaluations completed due weakness, limitations in self-care after recent GI procedure we will set goals for supervision with PT/OT/SLP.  Due to the current state of emergency, patients may not be receiving their 3-hours of Medicare-mandated therapy.  See Team Conference Notes for weekly updates to the plan of care

## 2021-06-26 NOTE — Progress Notes (Signed)
Physical Therapy Session Note  Patient Details  Name: Caroline [name] MRN: [id number] Date of Birth: 13-Aug-1936  Today's Date: 06/26/2021 PT Individual Time: 1310-1350 PT Individual Time Calculation (min): 40 min   Short Term Goals: Week 1:  PT Short Term Goal 1 (Week 1): Pt will perform sit <>stands w/CGA consistently w/LRAD and min cues PT Short Term Goal 2 (Week 1): Pt will demonstrate R head turn to attend to R visual field during functional mobility to avoid obstacles w/min cues only PT Short Term Goal 3 (Week 1): Pt will ambulate 150' w/LRAD and CGA w/min cues for AD management PT Short Term Goal 4 (Week 1): Pt will ascend/descend 4 6" steps w/1 handrail and mod A  to prepare for home environment   Skilled Therapeutic Interventions/Progress Updates:   Pt received sitting  on toilet with NT. Once completed BM, PT took over care. Gait training through hall 2 x 123ft with min assits and min cues for RW proximity intermittently throughout as well as improved posture with mild improvement. Sit<>stand transfers from various surfaces with CGA- Supervision assist with BUE support on RW>   Nustep sustained attention and recirpocal movement training x 5 minutes with cues for full ROM on the R as well as adhereance to target time of exercise.   Dynamic balance/sustained attention training while engaged in [place]. Pt able to complete 5 and 2 frames in standing with 1 UE support on RW prior to requesting seated rest breka due to LPB. Pt  noted to require mod cues and hand over hand assist for technique through hall 10 frames.   Patient returned to room and left sitting in arm chair with call bell in reach and all needs met.        Therapy Documentation Precautions:  Precautions Precautions: Fall Precaution Comments: visual neglect R side Restrictions Weight Bearing Restrictions: No  Vital Signs: Therapy Vitals Temp: (!) 97.2 F (36.2 C) Pulse Rate: 84 Resp: 17 BP: (!)  126/59 Patient Position (if appropriate): Sitting Oxygen Therapy SpO2: 99 % O2 Device: Room Air Pain: Pain Assessment Pain Scale: Faces Pain Score: 0-No pain     Therapy/Group: Individual Therapy  Lorie Phenix 06/26/2021, 1:49 PM

## 2021-06-26 NOTE — Progress Notes (Signed)
Occupational Therapy Session Note  Patient Details  Name: Caroline [name] MRN: [id number] Date of Birth: 07-16-1936  Today's Date: 06/26/2021 OT Individual Time: 1001-1120 OT Individual Time Calculation (min): 79 min    Short Term Goals: Week 1:  OT Short Term Goal 1 (Week 1): Pt will complete LB bathing sit to stand with supervision for two consecutive sessions. OT Short Term Goal 2 (Week 1): Pt will complete LB dressing with min guard sit to stand for two consecutive sessions. OT Short Term Goal 3 (Week 1): Pt will complete tolet transfers with min guard asisst using the RW and elevated toilet with rail. OT Short Term Goal 4 (Week 1): Pt will compensate for right visual field deficit by locating at least 3 items placed right of midline during selfcare tasks with no more than min instructional cueing.  Skilled Therapeutic Interventions/Progress Updates:    Pt in bed to start with her spouse present.  She was able to complete functional transfer from supine to sit with supervision and then used the RW to ambulate to the shower bench with min assist.  Mod demonstrational cueing for upright posture and for trying to stay closer to the RW.  She was able to remove her clothing with min assist and then work on bathing.  Mod instructional cueing for thoroughness and sequencing bathing.  She then completed transfer to the 3:1 over the toilet for dressing.  Supervision for UB dressing with min assist for donning brief and pants sit to stand.  She was able to donn her shoes with min assist after therapist donned her TEDs at total assist.  She transferred out to the chair at bedside to complete session with min assist again and flexed trunk while using the RW.  She was left with her spouse with safety belt in place and call button and phone in reach.  Mod questioning cueing for pt to show therapist the correct button to press if nursing was needed.  Pt oriented to city but not hospital this session.  She was also  note aware of the month, day of the week, or reason for hospitalization.    Therapy Documentation Precautions:  Precautions Precautions: Fall Precaution Comments: visual neglect R side Restrictions Weight Bearing Restrictions: No  Pain: Pain Assessment Pain Scale: Faces Pain Score: 0-No pain ADL: See Care Tool Section for some details of mobility and selfcare   Therapy/Group: Individual Therapy  Jensen Kilburg OTR/L 06/26/2021, 12:47 PM

## 2021-06-26 NOTE — Progress Notes (Addendum)
Speech Language Pathology Daily Session Note  Patient Details  Name: Caroline [name] MRN: [id number] Date of Birth: 23-Oct-1935  Today's Date: 06/26/2021 SLP Individual Time: 1400-1445 SLP Individual Time Calculation (min): 45 min  Short Term Goals: Week 1: SLP Short Term Goal 1 (Week 1): Patient will demonstrate orientation to person/place/time/situation with mod A verbal cues to utilize external aids SLP Short Term Goal 2 (Week 1): Patient will demonstrate improved intellectual awareness by identifying at least 2 cognitive and 2 physical changes with mod A verbal cues SLP Short Term Goal 3 (Week 1): Patient will recall and demonstrate use of call bell to request assist for help with mod A verbal cues SLP Short Term Goal 4 (Week 1): Patient will use a memory book to respond to questions about events and give information with mod A verbal cues.  Skilled Therapeutic Interventions: Pt greeted while ambulating with NT to bathroom using rolling walker. Pt agreeable to skilled ST intervention with focus on cognitive goals. SLP relieved NT and provided assist with toileting tasks with CGA. Pt ambulated to speech room with min A verbal cues to scan right, postural cues, and proper use of walker. SLP facilitated session by providing mod A verbal cues to orient to time/place/situation using external aids. Pt oriented to person and month without cues. Expressed "[place]" for place, "2020" for year, and vaguely recalled situation. Pt identified "balance" as a physical change since CVA and was unable to report any cognitive changes. External aids were placed in patient's room across from bed. SLP also initiated memory notebook and provided education on uses. SLP also facilitated session by providing min A verbal and visual cues for attention, problem solving, and working memory with novel card task "[place]." Pt progressed to sup A verbal cues by end of session. Pt ambulated back to room using rolling walker and required  mod A verbal cues this time due to increased frequency of right turns. Educated spouse on beneficial orientation and memory strategies using supportive verbal cues and use of external aids. He returned education via teach back. Oriented patient to call bell with sup A verbal cues. Patient was left in bed with her spouse at bedside, alarm activated and immediate needs within reach at end of session. Continue per current plan of care.      Pain Pain Assessment Pain Scale: 0-10 Pain Score: 0-No pain  Therapy/Group: Individual Therapy  Tamala Ser 06/26/2021, 3:51 PM

## 2021-06-26 NOTE — Progress Notes (Signed)
Physical Therapy Session Note  Patient Details  Name: Caroline [name] MRN: [id number] Date of Birth: Aug 25, 1936  Today's Date: 06/26/2021 PT Individual Time: 5638-9373 PT Individual Time Calculation (min): 43 min   Short Term Goals: Week 1:  PT Short Term Goal 1 (Week 1): Pt will perform sit <>stands w/CGA consistently w/LRAD and min cues PT Short Term Goal 2 (Week 1): Pt will demonstrate R head turn to attend to R visual field during functional mobility to avoid obstacles w/min cues only PT Short Term Goal 3 (Week 1): Pt will ambulate 150' w/LRAD and CGA w/min cues for AD management PT Short Term Goal 4 (Week 1): Pt will ascend/descend 4 6" steps w/1 handrail and mod A  to prepare for home environment  Skilled Therapeutic Interventions/Progress Updates:    Patient received asleep in bed, easy to wake, agreeable to PT. She denies pain. When asked if she needed to use the restroom prior to starting therapy she reported not being able to feel whether she needs to go or not. She was able to sit edge of bed with supervision and HOB elevated. Doffing dirty shirt and donning new one with supervision and verbal cues for shirt orientation. Patient unable to verify that PT had handed the shirt to her in the correct orientation. Upon standing to change pants, patient became aware that she had been incontinent of urine. She ambulated to the bathroom with RW and MinA + max verbal cues for spatial awareness and visual scanning to find bathroom, even though the door was on her L. She also required increased assist to sit correctly on the toilet due to her R inattention. Doffed dirty pants in standing with MinA and completed perihygiene with supervision. Donned new brief and pants in sitting with ModA. Patient declined additional AM ADLs at this time. She ambulated to the therapy gym ~155ft with RW and MinA + Max verbal cues for visual scanning/avoiding obstacles. Patient with noted difficulty observing obstacles even  directly in front of her, as well as to the R. Card matching task with board biased to the R completed in standing. She required consistent verbal cue reminders for task at hand and to scan all the way to the R. Patient perseverating on her R eye vision and repeatedly would state that she didn't know what happened to it. PT orienting patient to situation and reason for decreased R vision and compensatory strategies. Patient ambulating back to her room, up in bed, bed alarm on, call light within reach.   Therapy Documentation Precautions:  Precautions Precautions: Fall Precaution Comments: visual neglect R side Restrictions Weight Bearing Restrictions: No     Therapy/Group: Individual Therapy  Elizebeth Koller, PT, DPT, CBIS  06/26/2021, 7:49 AM

## 2021-06-26 NOTE — Progress Notes (Signed)
PROGRESS NOTE   Subjective/Complaints: Patient seen lying in bed this morning.  She states she slept well overnight.  Family at bedside, upset with patient with staff overnight.  She notes she had a good first day of therapies yesterday.  Been  ROS: Denies CP, SOB, N/V/D   No results found. Recent Labs    06/24/21 1336 06/25/21 0533  WBC 7.1 6.5  HGB 12.4 11.6*  HCT 38.4 36.0  PLT 356 306    Recent Labs    06/24/21 1336 06/25/21 0533  NA  --  137  K  --  3.8  CL  --  104  CO2  --  24  GLUCOSE  --  90  BUN  --  11  CREATININE 0.76 0.73  CALCIUM  --  8.7*     Intake/Output Summary (Last 24 hours) at 06/26/2021 1220 Last data filed at 06/26/2021 4827 Gross per 24 hour  Intake 370 ml  Output --  Net 370 ml         Physical Exam: Vital Signs Blood pressure (!) 140/59, pulse 72, temperature 98.1 F (36.7 C), resp. rate 14, height 5\' 4"  (1.626 m), weight 62.8 kg, SpO2 94 %. Constitutional: No distress . Vital signs reviewed. HENT: Normocephalic.  Atraumatic. Eyes: EOMI. No discharge. Cardiovascular: No JVD.  RRR. Respiratory: Normal effort.  No stridor.  Bilateral clear to auscultation. GI: Non-distended.  BS +. Skin: Warm and dry.  Intact. Psych: Normal mood.  Normal behavior. Musc: No edema in extremities.  No tenderness in extremities. Neuro: Alert and oriented Motor: Bilateral upper extremities: 5/5 proximal distal Left lower extremity: 5/5 proximal distal Right lower extremity: Hip flexion, knee extension 4+/5, ankle dorsiflexion 5/5   Assessment/Plan: 1. Functional deficits which require 3+ hours per day of interdisciplinary therapy in a comprehensive inpatient rehab setting. Physiatrist is providing close team supervision and 24 hour management of active medical problems listed below. Physiatrist and rehab team continue to assess barriers to discharge/monitor patient progress toward functional and  medical goals  Care Tool:  Bathing    Body parts bathed by patient: Right arm, Left arm, Chest, Abdomen, Front perineal area, Buttocks, Right upper leg, Left upper leg, Face, Left lower leg, Right lower leg         Bathing assist Assist Level: Minimal Assistance - Patient > 75%     Upper Body Dressing/Undressing Upper body dressing   What is the patient wearing?: Pull over shirt    Upper body assist Assist Level: Supervision/Verbal cueing    Lower Body Dressing/Undressing Lower body dressing      What is the patient wearing?: Incontinence brief, Pants     Lower body assist Assist for lower body dressing: Moderate Assistance - Patient 50 - 74%     Toileting Toileting    Toileting assist Assist for toileting: Minimal Assistance - Patient > 75%     Transfers Chair/bed transfer  Transfers assist     Chair/bed transfer assist level: Moderate Assistance - Patient 50 - 74%     Locomotion Ambulation   Ambulation assist      Assist level: Moderate Assistance - Patient 50 - 74% Assistive device: No  Device Max distance: 10'   Walk 10 feet activity   Assist     Assist level: Moderate Assistance - Patient - 50 - 74% Assistive device: No Device   Walk 50 feet activity   Assist    Assist level: Total Assistance - Patient < 25% Assistive device: No Device    Walk 150 feet activity   Assist    Assist level: Total Assistance - Patient < 25% Assistive device: No Device    Walk 10 feet on uneven surface  activity   Assist Walk 10 feet on uneven surfaces activity did not occur: Safety/medical concerns (Fatigue, R side inattention)         Wheelchair     Assist Is the patient using a wheelchair?: No             Wheelchair 50 feet with 2 turns activity    Assist            Wheelchair 150 feet activity     Assist          Blood pressure (!) 140/59, pulse 72, temperature 98.1 F (36.7 C), resp. rate 14, height 5'  4" (1.626 m), weight 62.8 kg, SpO2 94 %.    Medical Problem List and Plan: 1.  Decreased functional mobility with gait disturbance secondary and dense right homonymous hemianopsia due  to large acute left posterior cerebral artery infarction after robotic paraesophageal hernia repair, gastropexy, upper endoscopy 06/15/2021 as well as history of right thalamic CVA 2019  Continue CIR 2.  Antithrombotics: -DVT/anticoagulation:  Pharmaceutical: Lovenox             -antiplatelet therapy: Aspirin 81 mg daily and Plavix and 5 mg day x3 weeks then Plavix alone 3. Pain Management: Tylenol as needed 4. Mood history of cognitive impairment:              -antipsychotic agents: Seroquel 25 mg nightly 5. Neuropsych: This patient is not fully capable of making decisions on her own behalf. 6. Skin/Wound Care: Routine skin checks 7. Fluids/Electrolytes/Nutrition: Routine in and outs BMP within acceptable range on 9/22 8.  Hypertension.  Avapro 150 mg daily.    Relatively controlled on 9/23 Monitor with increased mobility 9.  Hyperlipidemia.  Lipitor 80 10.  Gram-negative rod UTI.  Complete course of Rocephin last dose 9/24 11.  Hypothyroidism.  Synthroid 12.  Hypoalbuminemia  Supplement initiated on 9/23 13.  Post stroke dysphagia  D1 thins, advance diet as tolerated   LOS: 2 days A FACE TO FACE EVALUATION WAS PERFORMED  Caroline [name] Caroline [name] 06/26/2021, 12:20 PM

## 2021-06-27 NOTE — Progress Notes (Addendum)
Speech Language Pathology Daily Session Note  Patient Details  Name: Caroline [name] MRN: [id number] Date of Birth: [birth year]  Today's Date: 06/27/2021 SLP Individual Time: 1310-1358 SLP Individual Time Calculation (min): 48 min  Short Term Goals: Week 1: SLP Short Term Goal 1 (Week 1): Patient will demonstrate orientation to person/place/time/situation with mod A verbal cues to utilize external aids SLP Short Term Goal 2 (Week 1): Patient will demonstrate improved intellectual awareness by identifying at least 2 cognitive and 2 physical changes with mod A verbal cues SLP Short Term Goal 3 (Week 1): Patient will recall and demonstrate use of call bell to request assist for help with mod A verbal cues SLP Short Term Goal 4 (Week 1): Patient will use a memory book to respond to questions about events and give information with mod A verbal cues.  Skilled Therapeutic Interventions:Skilled ST services focused on cognitive skills. Pt was orientated to general place and situation but required mod A verbal cues to utilize visual aids in room to recall specific information. SLP and pt discussed acute deficits from CVA, pt mod I noted changes in right vision field and feeling scared about moving as well as forgetfulness. SLP provided education pertaining to left CVA impacting right side, resulting in changes in right attention, balance, focused/sustained attention, basic problem solving, orientation and recall. SLP facilitated basic problem solving, right attention, and sustained attention in 4 step card sequencing task, pt required max A verbal cues, however when broken down step by step pt required mod A verbal cues. Pt did not initially recall call bell when presented to her and required max A verbal cues to return demonstration of call, however SLP applied "HELP" sign on call bell and pt was able to return demonstration with mod A verbal cues. SLP recorded events in memory notebook. Pt was left in room with  daughter, call bell within reach and chair alarm set. SLP recommends to continue skilled services.     Pain Pain Assessment Pain Scale: Faces Pain Score: 0-No pain  Therapy/Group: Individual Therapy  [name]  [facility] 06/27/2021, 2:26 PM

## 2021-06-27 NOTE — Progress Notes (Signed)
Occupational Therapy Session Note  Patient Details  Name: Caroline [name] MRN: [id number] Date of Birth: September 26, 1936  Today's Date: 06/27/2021 OT Individual Time: 0902-1006 OT Individual Time Calculation (min): 64 min    Short Term Goals: Week 1:  OT Short Term Goal 1 (Week 1): Pt will complete LB bathing sit to stand with supervision for two consecutive sessions. OT Short Term Goal 2 (Week 1): Pt will complete LB dressing with min guard sit to stand for two consecutive sessions. OT Short Term Goal 3 (Week 1): Pt will complete tolet transfers with min guard asisst using the RW and elevated toilet with rail. OT Short Term Goal 4 (Week 1): Pt will compensate for right visual field deficit by locating at least 3 items placed right of midline during selfcare tasks with no more than min instructional cueing.  Skilled Therapeutic Interventions/Progress Updates:    Session 1: ([id number])  Pt in bed to start with completion of supine to sit with supervision.  She then completed donning her shoes with setup and back corset with min contact assist.    Minimal back pain reported in session while sitting but increases with standing when trying to obtain upright posture.  She was able to ambulate with the RW to the sink for grooming tasks of brushing her hair and teeth with setup.  She was able to then ambulate to the therapy gym with use of the RW and min assist.  Max instructional cueing for scanning right of midline to locate obstacles in the hallway and to move onto the right side as she stayed close to the wall and rail on the left.  Moderate increased trunk flexion noted with pt pushing walker too far in front of her requiring max instructional cueing to stand more upright and closer to the RW.  Once she reached the gym, worked on UE strengthening in sitting.  Used 2 lb dowel rod for shoulder flexion with two sets of elbow flexion extension as well.  Also completed 2 sets 10 reps of horizontal shoulder abduction  with use of the medium resistance band along with 1 set of 10 for shoulder row.  Pt with no pain or discomfort noted from exercises.  Ambulated back to the room at min assist level with the RW and max instructional cueing for scanning to the right to look for her room number.  She was left resting in the recliner with the call button and phone in reach and safety belt in place and daughter in room as well.     Session 2: ([id number])  Pt with no report of pain during session.  She was able to complete transfer to the wheelchair with min assist from the recliner and mod demonstrational cueing for positioning the walker in a tight space secondary to right visual field deficit.  Once in the wheelchair she was taken down to the ortho gym where she engaged in use of the BITs for increased right visual scanning.  She was able to complete 2 sets of 2 mins in standing with min guard assist to complete Visual Scanning Program.  She was able to complete the first set in an average of 3.9 seconds overall with 7 seconds average to locate dots on the right side of the screen.  After increased education on eye and head turns to the right, she was able to improve on the second set with an average overall of 2.8 seconds with 3.5 seconds on the right side of the screen.  Finished session with completion of transfer from the wheelchair to the recliner with min assist.  She was left with the call button and phone in reach and safety alarm in place.    Therapy Documentation Precautions:  Precautions Precautions: Fall Precaution Comments: visual neglect R side Restrictions Weight Bearing Restrictions: No  Pain: Pain Assessment Pain Scale: Faces Pain Score: 0-No pain ADL: See Care Tool Section for some details of mobility and selfcare   Therapy/Group: Individual Therapy  Ledora Delker OTR/L 06/27/2021, 12:23 PM

## 2021-06-27 NOTE — Progress Notes (Signed)
Physical Therapy Session Note  Patient Details  Name: Caroline [name] MRN: [id number] Date of Birth: [birth year]  Today's Date: 06/27/2021 PT Individual Time: 1106-1212 PT Individual Time Calculation (min): 66 min   Short Term Goals: Week 1:  PT Short Term Goal 1 (Week 1): Pt will perform sit <>stands w/CGA consistently w/LRAD and min cues PT Short Term Goal 2 (Week 1): Pt will demonstrate R head turn to attend to R visual field during functional mobility to avoid obstacles w/min cues only PT Short Term Goal 3 (Week 1): Pt will ambulate 150' w/LRAD and CGA w/min cues for AD management PT Short Term Goal 4 (Week 1): Pt will ascend/descend 4 6" steps w/1 handrail and mod A  to prepare for home environment  Skilled Therapeutic Interventions/Progress Updates:    Pt received sitting in recliner with her daughter, [name], present and pt agreeable to therapy session. Therapist provided pt with wheelchair and cushion to be used throughout CIR stay. Donned lumbar corset for pt comfort max assist to improve positioning. Sit<>stands using RW with CGA/light min assist and repeated cuing to push up from seat/armrests as opposed to from RW with poor carryover throughout session.   Gait training ~171ft to main therapy gym using RW with CGA for safety and max verbal cuing for pt to visually scan R to locate hallway to gym and the gym door itself - cuing for improved AD management as pt tends to push it too far forward (though due to pt's kyphotic posturing naturally keeps it slightly more forward compared to normal). Educated pt on importance of turning head and visually scanning towards R consistently with decreased cuing from therapist to improve self-awareness of this - pt able to recall throughout session that the vision in her R eye is impaired but still requires cuing to turn/visually scan in that direction to compensate for this.  Dynamic gait training in hallway using RW working on visual scanning and short term  recall of a task via finding post-it notes with letters A-E followed by numbers 1-5  - requires mod/max verbal cuing to recall the task throughout - requires >20 minutes to locate 5 of the post-it notes with slow visual scanning and once pt visually locates the sticky note she has to re-find it after taking steps towards it because her visual field has shifted - attempted to have pt visually scan down hallway on both sides to identify where the sticky notes are located prior to starting to walk down the hall, but difficulty with this - also due to short term memory impairments pt unable to remember where even one post-it is located to help with planning a route to finding them in order. Requires CGA for safety throughout and repeated cuing for safe AD management demonstrating poor carryover/recall throughout session.  Gait training ~160ft back to room using RW with CGA and therapist having pt engage in visual scanning and short term recall task to locate signs on the walls to direct her back to her room and recall her room number.  Pt left on toilet with NT present to assume care of patient and pt's daughter returning to room.  Therapy Documentation Precautions:  Precautions Precautions: Fall Precaution Comments: visual neglect R side Restrictions Weight Bearing Restrictions: No   Pain:  No reports of pain throughout session.  Therapy/Group: Individual Therapy  Ginny Forth , PT, DPT, NCS, CSRS 06/27/2021, 8:03 AM

## 2021-06-28 NOTE — Progress Notes (Signed)
PROGRESS NOTE   Subjective/Complaints:  Pt asking about driving, discussed that she is will not be able to drive post d/c and we are currently working on the basics   ROS: Denies CP, SOB, N/V/D   No results found. No results for input(s): WBC, HGB, HCT, PLT in the last 72 hours.  No results for input(s): NA, K, CL, CO2, GLUCOSE, BUN, CREATININE, CALCIUM in the last 72 hours.   Intake/Output Summary (Last 24 hours) at 06/28/2021 1100 Last data filed at 06/28/2021 0830 Gross per 24 hour  Intake 240 ml  Output --  Net 240 ml         Physical Exam: Vital Signs Blood pressure (!) 131/56, pulse 79, temperature 98.3 F (36.8 C), temperature source Oral, resp. rate 14, height 5\' 4"  (1.626 m), weight 62.8 kg, SpO2 94 %.  General: No acute distress Mood and affect are appropriate Heart: Regular rate and rhythm no rubs murmurs or extra sounds Lungs: Clear to auscultation, breathing unlabored, no rales or wheezes Abdomen: Positive bowel sounds, soft nontender to palpation, nondistended Extremities: No clubbing, cyanosis, or edema Skin: No evidence of breakdown, no evidence of rash  Neuro: Alert and oriented Motor: Bilateral upper extremities: 5/5 proximal distal Left lower extremity: 5/5 proximal distal Right lower extremity: Hip flexion, knee extension 4+/5, ankle dorsiflexion 5/5   Assessment/Plan: 1. Functional deficits which require 3+ hours per day of interdisciplinary therapy in a comprehensive inpatient rehab setting. Physiatrist is providing close team supervision and 24 hour management of active medical problems listed below. Physiatrist and rehab team continue to assess barriers to discharge/monitor patient progress toward functional and medical goals  Care Tool:  Bathing    Body parts bathed by patient: Right arm, Left arm, Chest, Abdomen, Front perineal area, Buttocks, Right upper leg, Left upper leg, Face,  Left lower leg, Right lower leg         Bathing assist Assist Level: Minimal Assistance - Patient > 75%     Upper Body Dressing/Undressing Upper body dressing   What is the patient wearing?: Pull over shirt    Upper body assist Assist Level: Supervision/Verbal cueing    Lower Body Dressing/Undressing Lower body dressing      What is the patient wearing?: Incontinence brief, Pants     Lower body assist Assist for lower body dressing: Minimal Assistance - Patient > 75%     Toileting Toileting    Toileting assist Assist for toileting: Minimal Assistance - Patient > 75%     Transfers Chair/bed transfer  Transfers assist     Chair/bed transfer assist level: Minimal Assistance - Patient > 75% Chair/bed transfer assistive device:   Ambulation assist      Assist level: Minimal Assistance - Patient > 75% Assistive device: Walker-rolling Max distance: 150'   Walk 10 feet activity   Assist     Assist level: Moderate Assistance - Patient - 50 - 74% Assistive device: No Device   Walk 50 feet activity   Assist    Assist level: Total Assistance - Patient < 25% Assistive device: No Device    Walk 150 feet activity   Assist  Assist level: Total Assistance - Patient < 25% Assistive device: No Device    Walk 10 feet on uneven surface  activity   Assist Walk 10 feet on uneven surfaces activity did not occur: Safety/medical concerns         Wheelchair     Assist Is the patient using a wheelchair?: No             Wheelchair 50 feet with 2 turns activity    Assist            Wheelchair 150 feet activity     Assist          Blood pressure (!) 131/56, pulse 79, temperature 98.3 F (36.8 C), temperature source Oral, resp. rate 14, height 5\' 4"  (1.626 m), weight 62.8 kg, SpO2 94 %.    Medical Problem List and Plan: 1.  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  Antithrombotics: -DVT/anticoagulation:  Pharmaceutical: Lovenox             -antiplatelet therapy: Aspirin 81 mg daily and Plavix and 5 mg day x3 weeks then Plavix alone 3. Pain Management: Tylenol as needed 4. Mood history of cognitive impairment:              -antipsychotic agents: Seroquel 25 mg nightly 5. Neuropsych: This patient is not fully capable of making decisions on her own behalf. 6. Skin/Wound Care: Routine skin checks 7. Fluids/Electrolytes/Nutrition: Routine in and outs BMP within acceptable range on 9/22 8.  Hypertension.  Avapro 150 mg daily.   Vitals:   06/27/21 1421 06/27/21 1954  BP: (!) 150/56 (!) 131/56  Pulse: 85 79  Resp: 19 14  Temp: 97.9 F (36.6 C) 98.3 F (36.8 C)  SpO2: 95% 94%    Monitor with increased mobility 9.  Hyperlipidemia.  Lipitor 80 10.  Gram-negative rod UTI.  Complete course of Rocephin last dose 9/24 11.  Hypothyroidism.  Synthroid 12.  Hypoalbuminemia  Supplement initiated on 9/23 13.  Post stroke dysphagia  D1 thins, advance diet as tolerated   LOS: 4 days A FACE TO FACE EVALUATION WAS PERFORMED  10/23 06/28/2021, 11:00 AM

## 2021-06-29 NOTE — Progress Notes (Signed)
PROGRESS NOTE   Subjective/Complaints:  No issues , husband at bedside , discussed rehab process, Home health, team conf, role of various team members   ROS: Denies CP, SOB, N/V/D   No results found. No results for input(s): WBC, HGB, HCT, PLT in the last 72 hours.  No results for input(s): NA, K, CL, CO2, GLUCOSE, BUN, CREATININE, CALCIUM in the last 72 hours.   Intake/Output Summary (Last 24 hours) at 06/29/2021 0945 Last data filed at 06/28/2021 1858 Gross per 24 hour  Intake 700 ml  Output --  Net 700 ml         Physical Exam: Vital Signs Blood pressure (!) 127/51, pulse 75, temperature 98.5 F (36.9 C), temperature source Oral, resp. rate 17, height 5\' 4"  (1.626 m), weight 62.8 kg, SpO2 94 %.  General: No acute distress Mood and affect are appropriate Heart: Regular rate and rhythm no rubs murmurs or extra sounds Lungs: Clear to auscultation, breathing unlabored, no rales or wheezes Abdomen: Positive bowel sounds, soft nontender to palpation, nondistended Extremities: No clubbing, cyanosis, or edema Skin: No evidence of breakdown, no evidence of rash  Neuro: Alert and oriented Motor: Bilateral upper extremities: 5/5 proximal distal Left lower extremity: 5/5 proximal distal Right lower extremity: Hip flexion, knee extension 4+/5, ankle dorsiflexion 5/5   Assessment/Plan: 1. Functional deficits which require 3+ hours per day of interdisciplinary therapy in a comprehensive inpatient rehab setting. Physiatrist is providing close team supervision and 24 hour management of active medical problems listed below. Physiatrist and rehab team continue to assess barriers to discharge/monitor patient progress toward functional and medical goals  Care Tool:  Bathing    Body parts bathed by patient: Right arm, Left arm, Chest, Abdomen, Front perineal area, Buttocks, Right upper leg, Left upper leg, Face, Left lower leg,  Right lower leg         Bathing assist Assist Level: Minimal Assistance - Patient > 75%     Upper Body Dressing/Undressing Upper body dressing   What is the patient wearing?: Pull over shirt    Upper body assist Assist Level: Supervision/Verbal cueing    Lower Body Dressing/Undressing Lower body dressing      What is the patient wearing?: Incontinence brief, Pants     Lower body assist Assist for lower body dressing: Minimal Assistance - Patient > 75%     Toileting Toileting    Toileting assist Assist for toileting: Minimal Assistance - Patient > 75%     Transfers Chair/bed transfer  Transfers assist     Chair/bed transfer assist level: Minimal Assistance - Patient > 75% Chair/bed transfer assistive device:   Ambulation assist      Assist level: Minimal Assistance - Patient > 75% Assistive device: Walker-rolling Max distance: 150'   Walk 10 feet activity   Assist     Assist level: Moderate Assistance - Patient - 50 - 74% Assistive device: No Device   Walk 50 feet activity   Assist    Assist level: Total Assistance - Patient < 25% Assistive device: No Device    Walk 150 feet activity   Assist    Assist level: Total  Assistance - Patient < 25% Assistive device: No Device    Walk 10 feet on uneven surface  activity   Assist Walk 10 feet on uneven surfaces activity did not occur: Safety/medical concerns         Wheelchair     Assist Is the patient using a wheelchair?: No             Wheelchair 50 feet with 2 turns activity    Assist            Wheelchair 150 feet activity     Assist          Blood pressure (!) 127/51, pulse 75, temperature 98.5 F (36.9 C), temperature source Oral, resp. rate 17, height 5\' 4"  (1.626 m), weight 62.8 kg, SpO2 94 %.    Medical Problem List and Plan: 1.  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  Antithrombotics: -DVT/anticoagulation:  Pharmaceutical: Lovenox             -antiplatelet therapy: Aspirin 81 mg daily and Plavix 75 mg day x3 weeks then Plavix alone 3. Pain Management: Tylenol as needed 4. Mood history of cognitive impairment:              -antipsychotic agents: Seroquel 25 mg nightly 5. Neuropsych: This patient is not fully capable of making decisions on her own behalf. 6. Skin/Wound Care: Routine skin checks 7. Fluids/Electrolytes/Nutrition: Routine in and outs BMP within acceptable range on 9/22 8.  Hypertension.  Avapro 150 mg daily.   Vitals:   06/28/21 2035 06/29/21 0426  BP: 131/60 (!) 127/51  Pulse: 84 75  Resp: 17 17  Temp: 98 F (36.7 C) 98.5 F (36.9 C)  SpO2: 93% 94%   Controlled 9/26 9.  Hyperlipidemia.  Lipitor 80 10.  Gram-negative rod UTI.  Complete course of Rocephin last dose 9/24 11.  Hypothyroidism.  Synthroid 12.  Hypoalbuminemia  Supplement initiated on 9/23 13.  Post stroke dysphagia  D1 thins, advance diet as tolerated   LOS: 5 days A FACE TO FACE EVALUATION WAS PERFORMED  10/23 06/29/2021, 9:45 AM

## 2021-06-29 NOTE — Progress Notes (Signed)
Physical Therapy Session Note  Patient Details  Name: Caroline [name] MRN: [id number] Date of Birth: [birth year]  Today's Date: 06/29/2021 PT Individual Time: 1400-1455 PT Individual Time Calculation (min): 55 min   Short Term Goals: Week 1:  PT Short Term Goal 1 (Week 1): Pt will perform sit <>stands w/CGA consistently w/LRAD and min cues PT Short Term Goal 2 (Week 1): Pt will demonstrate R head turn to attend to R visual field during functional mobility to avoid obstacles w/min cues only PT Short Term Goal 3 (Week 1): Pt will ambulate 150' w/LRAD and CGA w/min cues for AD management PT Short Term Goal 4 (Week 1): Pt will ascend/descend 4 6" steps w/1 handrail and mod A  to prepare for home environment  Skilled Therapeutic Interventions/Progress Updates:    Patient received in bathroom with RN, agreeable to PT. She denies pain. Husband at bedside. She required max multimodal cues to find wc and safely have a seat. PT transporting patient in wc to therapy gym for time management and energy conservation. She was able to ambulate in/out of cones 46ftx2 with RW and MinA. Significantly forward flexed posture (premorbid) and multimodal cues to attend/scan to R side to avoid cones. Patient then ambulating in mock apt to simulate home environment. She was cued to "walk to the other side of the bed, find her wc and walk back to it." Patient ambulated around bed and then could not remember the rest of the 2 part cue. She began ambulating through mock kitchen in an attempt to find her wc. PT then had to remind patient of task at hand and she required TotalA to find her wc and navigate back to it. PT placing red theraband across front of walker to remind patient to remain within walker frame- however, this had little success as patient continued to push walker too far in front of herself. Patient completing gait task around bed x3 and each time required max cues to find her way back to her wc. Patient ambulating final  169ft back to her room with RW and MinA. Patient returning to bed per her and husbands request. Bed alarm on, call light within reach, husband at bedside.    Therapy Documentation Precautions:  Precautions Precautions: Fall Precaution Comments: visual neglect R side Restrictions Weight Bearing Restrictions: No     Therapy/Group: Individual Therapy  Elizebeth Koller, PT, DPT, CBIS  06/29/2021, 7:44 AM

## 2021-06-29 NOTE — Progress Notes (Signed)
Speech Language Pathology Daily Session Note  Patient Details  Name: Caroline [name] MRN: [id number] Date of Birth: 1936/09/22  Today's Date: 06/29/2021 SLP Individual Time: 1300-1345 SLP Individual Time Calculation (min): 45 min  Short Term Goals: Week 1: SLP Short Term Goal 1 (Week 1): Patient will demonstrate orientation to person/place/time/situation with mod A verbal cues to utilize external aids SLP Short Term Goal 2 (Week 1): Patient will demonstrate improved intellectual awareness by identifying at least 2 cognitive and 2 physical changes with mod A verbal cues SLP Short Term Goal 3 (Week 1): Patient will recall and demonstrate use of call bell to request assist for help with mod A verbal cues SLP Short Term Goal 4 (Week 1): Patient will use a memory book to respond to questions about events and give information with mod A verbal cues.  Skilled Therapeutic Interventions: Pt was greeted in wheelchair and agreeable to skilled ST intervention with focus on cognitive goals. At start of session, patient acknowledged she has experienced a memory decline since recent CVA and also identified that her vision has changed as she displayed difficulty identifying information on large print orientation aids requiring mod A verbal and visual cues for visual scanning. Required max A for scanning for reading and writing in memory notebook. While reading, patient was known to verbally spell words first instead of reading the whole word and often omitted the last 2-3 letters consistent with right inattention. Facilitated use of memory notebook throughout session to improve recall of novel information. Pt displayed difficulty reading her own writing and had improved success with large print font. Pt was unable to recall she wrote in her notebook following 2 minute delay. SLP also facilitated session by providing min-to-mod A verbal cues for verbal reasoning and simple problem solving skills. Spouse continues to carry  over use of orientation aids and memory notebook with patient in room. Patient was left in wheelchair with alarm activated and immediate needs within reach at end of session. Continue per current plan of care.      Pain Pain Assessment Pain Scale: 0-10 Pain Score: 0-No pain  Therapy/Group: Individual Therapy  Tamala Ser 06/29/2021, 4:09 PM

## 2021-06-29 NOTE — Progress Notes (Signed)
Physical Therapy Session Note  Patient Details  Name: Caroline [name] MRN: [id number] Date of Birth: September 05, 1936  Today's Date: 06/29/2021 PT Individual Time: 1000-1045 PT Individual Time Calculation (min): 45 min   Short Term Goals: Week 1:  PT Short Term Goal 1 (Week 1): Pt will perform sit <>stands w/CGA consistently w/LRAD and min cues PT Short Term Goal 2 (Week 1): Pt will demonstrate R head turn to attend to R visual field during functional mobility to avoid obstacles w/min cues only PT Short Term Goal 3 (Week 1): Pt will ambulate 150' w/LRAD and CGA w/min cues for AD management PT Short Term Goal 4 (Week 1): Pt will ascend/descend 4 6" steps w/1 handrail and mod A  to prepare for home environment  Skilled Therapeutic Interventions/Progress Updates:    Pt received seated in w/c in room handed off from OT session, agreeable to PT session. No complaints of pain. Sit to stand with CGA to RW throughout session. Ambulation up to 200 ft with RW and CGA, max cueing for safe RW management and keeping RW closer to body as well as to attend to R visual field. Ambulation through obstacle course with RW and CGA weaving through cones with cues to scan R visual field and to attend to task. BITS visual scanning task finding ABC's 1-26, 1-15 in standing with RW and CGA, cues to stan to the R to find targets. Pt scores 16:36 sec reaction time with 26 letters and 12.38 sec with 15 letters. Pt returned to room and left seated in w/c with needs in reach, quick release belt and chair alarm in place at end of session.  Therapy Documentation Precautions:  Precautions Precautions: Fall Precaution Comments: visual neglect R side Restrictions Weight Bearing Restrictions: No    Therapy/Group: Individual Therapy   Peter Congo, PT, DPT, CSRS  06/29/2021, 12:21 PM

## 2021-06-29 NOTE — Progress Notes (Signed)
Occupational Therapy Session Note  Patient Details  Name: Caroline [name] MRN: [id number] Date of Birth: 09-28-1936  Today's Date: 06/29/2021 OT Individual Time: 0900-1000 OT Individual Time Calculation (min): 60 min    Short Term Goals: Week 1:  OT Short Term Goal 1 (Week 1): Pt will complete LB bathing sit to stand with supervision for two consecutive sessions. OT Short Term Goal 2 (Week 1): Pt will complete LB dressing with min guard sit to stand for two consecutive sessions. OT Short Term Goal 3 (Week 1): Pt will complete tolet transfers with min guard asisst using the RW and elevated toilet with rail. OT Short Term Goal 4 (Week 1): Pt will compensate for right visual field deficit by locating at least 3 items placed right of midline during selfcare tasks with no more than min instructional cueing.  Skilled Therapeutic Interventions/Progress Updates:    Patient in bed, alert and pleasant, she denies pain at this time and agrees to washing up and dressing w/c level.  Supine to sitting edge of bed with CGA.   Sit to stand and pivot transfer with RW CGA with increased time and gentle tactile cues for directionality.  Oral care with set up, she washed face with set up, OH shirt CS/set up, donns pull up and pants with min A and min cues for technique and safety.  Sit to stand CGA, clothing management in stance with CGA.  Completed standing visual scanning activity - she is able to stand 3 times with CGA for approx 3 minutes, requires moderate cues for recall of task and max cues to scan adequately to right side.  Reviewed date, location with moderate cues, max cues to recall tasks for memory book at close of session.  Hand off to PT at 10:00.    Therapy Documentation Precautions:  Precautions Precautions: Fall Precaution Comments: visual neglect R side Restrictions Weight Bearing Restrictions: No   Therapy/Group: Individual Therapy  Barrie Lyme 06/29/2021, 7:28 AM

## 2021-06-30 MED ORDER — DOCUSATE SODIUM 100 MG PO CAPS
200.0000 mg | ORAL_CAPSULE | Freq: Two times a day (BID) | ORAL | Status: DC
  Administered 2021-06-30 – 2021-07-07 (×13): 200 mg via ORAL
  Filled 2021-06-30 (×14): qty 2

## 2021-06-30 NOTE — Progress Notes (Signed)
Occupational Therapy Session Note  Patient Details  Name: Caroline [name] MRN: [id number] Date of Birth: 1936-04-01  Today's Date: 06/30/2021 OT Individual Time: 2637-8588 OT Individual Time Calculation (min): 55 min    Short Term Goals: Week 1:  OT Short Term Goal 1 (Week 1): Pt will complete LB bathing sit to stand with supervision for two consecutive sessions. OT Short Term Goal 2 (Week 1): Pt will complete LB dressing with min guard sit to stand for two consecutive sessions. OT Short Term Goal 3 (Week 1): Pt will complete tolet transfers with min guard asisst using the RW and elevated toilet with rail. OT Short Term Goal 4 (Week 1): Pt will compensate for right visual field deficit by locating at least 3 items placed right of midline during selfcare tasks with no more than min instructional cueing.  Skilled Therapeutic Interventions/Progress Updates:  Pt greeted supine in bed reporting need to void bladder. Session focus on BADL reeducation and ADL transfers. Pt completed bed mobility with supervision from elevated HOB. Pt completed sit<>stand with CGA with Rw.  Pt completed ambulatory toilet transfer with RW and CGA- MIN A ( MINA when stepping over incline in bathroom). Pt required MIN A for 3/3 toileting tasks needing most assist to manage soiled pants.pt completed LB dressing from toilet d/t incontinent urine episode. MIN A for LB dressing from toilet seat.  Pt completed ambulatory transfer out of bathroom to w/c with RW and CGA. Pt completed oral care at sink with supervision. Pt transported to shower room from w/c with total A for time mgmt. Pt able to complete ambualtory shower transfer to walkin shower with MIN A with pt stepping backwards over threshold of shower and transitioning to shower seat, husband  confirms shower set- up. Pt transported back to room with total A where pt left seated in w/c with alarm belt activated and all needs within reach.                    Therapy  Documentation Precautions:  Precautions Precautions: Fall Precaution Comments: visual neglect R side Restrictions Weight Bearing Restrictions: No  Pain: pt reports no pain during session     Therapy/Group: Individual Therapy  Pollyann Glen [facility] 06/30/2021, 12:09 PM

## 2021-06-30 NOTE — Progress Notes (Signed)
PROGRESS NOTE   Subjective/Complaints:  No issues overnite , constipated incont of urine , discussed need to drink more   ROS: Denies CP, SOB, N/V/D   No results found. No results for input(s): WBC, HGB, HCT, PLT in the last 72 hours.  No results for input(s): NA, K, CL, CO2, GLUCOSE, BUN, CREATININE, CALCIUM in the last 72 hours.   Intake/Output Summary (Last 24 hours) at 06/30/2021 0904 Last data filed at 06/29/2021 1438 Gross per 24 hour  Intake 377 ml  Output --  Net 377 ml         Physical Exam: Vital Signs Blood pressure (!) 100/42, pulse 81, temperature 99.1 F (37.3 C), temperature source Oral, resp. rate 17, height 5\' 4"  (1.626 m), weight 62.8 kg, SpO2 95 %.  General: No acute distress Mood and affect are appropriate Heart: Regular rate and rhythm no rubs murmurs or extra sounds Lungs: Clear to auscultation, breathing unlabored, no rales or wheezes Abdomen: Positive bowel sounds, soft nontender to palpation, nondistended Extremities: No clubbing, cyanosis, or edema Skin: No evidence of breakdown, no evidence of rash Neuro: Alert and oriented Motor: Bilateral upper extremities: 5/5 proximal distal Left lower extremity: 5/5 proximal distal Right lower extremity: Hip flexion, knee extension 4+/5, ankle dorsiflexion 5/5   Assessment/Plan: 1. Functional deficits which require 3+ hours per day of interdisciplinary therapy in a comprehensive inpatient rehab setting. Physiatrist is providing close team supervision and 24 hour management of active medical problems listed below. Physiatrist and rehab team continue to assess barriers to discharge/monitor patient progress toward functional and medical goals  Care Tool:  Bathing    Body parts bathed by patient: Right arm, Left arm, Chest, Abdomen, Front perineal area, Buttocks, Right upper leg, Left upper leg, Face, Left lower leg, Right lower leg          Bathing assist Assist Level: Minimal Assistance - Patient > 75%     Upper Body Dressing/Undressing Upper body dressing   What is the patient wearing?: Pull over shirt    Upper body assist Assist Level: Supervision/Verbal cueing    Lower Body Dressing/Undressing Lower body dressing      What is the patient wearing?: Underwear/pull up, Pants     Lower body assist Assist for lower body dressing: Minimal Assistance - Patient > 75%     Toileting Toileting    Toileting assist Assist for toileting: Minimal Assistance - Patient > 75%     Transfers Chair/bed transfer  Transfers assist     Chair/bed transfer assist level: Minimal Assistance - Patient > 75% Chair/bed transfer assistive device:   Ambulation assist      Assist level: Minimal Assistance - Patient > 75% Assistive device: Walker-rolling Max distance: 150'   Walk 10 feet activity   Assist     Assist level: Moderate Assistance - Patient - 50 - 74% Assistive device: No Device   Walk 50 feet activity   Assist    Assist level: Total Assistance - Patient < 25% Assistive device: No Device    Walk 150 feet activity   Assist    Assist level: Total Assistance - Patient < 25% Assistive  device: No Device    Walk 10 feet on uneven surface  activity   Assist Walk 10 feet on uneven surfaces activity did not occur: Safety/medical concerns         Wheelchair     Assist Is the patient using a wheelchair?: No             Wheelchair 50 feet with 2 turns activity    Assist            Wheelchair 150 feet activity     Assist          Blood pressure (!) 100/42, pulse 81, temperature 99.1 F (37.3 C), temperature source Oral, resp. rate 17, height 5\' 4"  (1.626 m), weight 62.8 kg, SpO2 95 %.    Medical Problem List and Plan: 1.  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 team conf in am  -DVT/anticoagulation:  Pharmaceutical: Lovenox will d/c since pt amb >200'             -antiplatelet therapy: Aspirin 81 mg daily and Plavix 75 mg day x3 weeks then Plavix alone 3. Pain Management: Tylenol as needed 4. Mood history of cognitive impairment:              -antipsychotic agents: Seroquel 25 mg nightly 5. Neuropsych: This patient is not fully capable of making decisions on her own behalf. 6. Skin/Wound Care: Routine skin checks 7. Fluids/Electrolytes/Nutrition: Routine in and outs BMP within acceptable range on 9/22 8.  Hypertension.  Avapro 150 mg daily.   Vitals:   06/29/21 1953 06/30/21 0503  BP: (!) 147/60 (!) 100/42  Pulse: 83 81  Resp: 17 17  Temp: 98 F (36.7 C) 99.1 F (37.3 C)  SpO2: 96% 95%   Controlled 9/27, actually running on low side , fluid intake low will check BMET in am  9.  Hyperlipidemia.  Lipitor 80 10.  Gram-negative rod UTI.  Complete course of Rocephin last dose 9/24, encourage fluid intake, still incont, will place on toileting program  11.  Hypothyroidism.  Synthroid 12.  Hypoalbuminemia  Supplement initiated on 9/23 13.  Post stroke dysphagia  D1 thins, advance diet as tolerated   LOS: 6 days A FACE TO FACE EVALUATION WAS PERFORMED  10/23 06/30/2021, 9:04 AM

## 2021-06-30 NOTE — Progress Notes (Signed)
Physical Therapy Session Note  Patient Details  Name: Caroline [name] MRN: [id number] Date of Birth: 11/04/1935  Today's Date: 06/30/2021 PT Individual Time: 1093-2355 PT Individual Time Calculation (min): 92 min   Short Term Goals: Week 1:  PT Short Term Goal 1 (Week 1): Pt will perform sit <>stands w/CGA consistently w/LRAD and min cues PT Short Term Goal 2 (Week 1): Pt will demonstrate R head turn to attend to R visual field during functional mobility to avoid obstacles w/min cues only PT Short Term Goal 3 (Week 1): Pt will ambulate 150' w/LRAD and CGA w/min cues for AD management PT Short Term Goal 4 (Week 1): Pt will ascend/descend 4 6" steps w/1 handrail and mod A  to prepare for home environment  Skilled Therapeutic Interventions/Progress Updates:  Pt received sitting in w/c with her daughter, [name], present and pt agreeable to therapy session. Donned lumbar corset for pt comfort with max assist. Sit<>stands using RW with CGA progressing towards supervision throughout session with repeated cuing to push up from seat as opposed to pulling up on RW - pt able to problem solve with question cuing ~75% of the time but requires this prompt to correct.  Gait training ~145ft to main therapy gym using RW with CGA for safety - continues to requires max cuing to visually scan R in order to locate hallway towards gym and doorway into gym (no recall of R visual impairments at this time but throughout session once she recognizes visual impairments she slightly perseverates on them) - continues to require repeated cuing to maintain proximity to RW - due to kyphotic posturing pt maintains AD slightly more forward at baseline.   Focused session on gait training in various environments using RW targeting AD management along with dual-cognitive-task and R visual scanning to locate 5 colored bean bags in the hallway progressed to the ADL apartment for more functional gait - pt demos improved ability to visually  scan towards R (though continues to have more difficulty the further lateral she scans) and improved ability to relocate the item once she walks up to it after it has shifted in her visual field (compared to last time with this therapist). Continues to require verbal cuing for safe AD management especially when turning as pt tends to turn AD too far outside her BOS.   Furniture transfer to/from Armed forces technical officer with Control and instrumentation engineer from rocking for safety with supervision.  Gait training ~19ft 2x to/from ADL apartment using RW as described above. Stair navigation training, ascending/descending 4 steps 2x using B HRs with min assist for balance - step-to progressed to reciprocal pattern on ascent but maintained step-to on descent for safety though alternating leading LE. Discussed home entry with reports of no HRs per chart and the need to continue stair training.  Standing balance, RUE NMR, and R visual scanning task of locating scrabble letters to create words - significant difficulty scanning a grouping of letters to find the one requested by therapist - close supervision for standing balance using UE support on RW as needed.  Gait training ~112ft back to room using RW as described above, continuing to require cuing for R visual scanning and safe AD management. Pt's daughter, [name], reports concerns regarding D/C stating that pt's husband is not able to provide the proper level of support - therapist educated her that recommendation is for 24hr supervision for safety due to pt's memory impairments affecting her safety awareness along with the R visual impairments - team will  plan to discuss this more during conference with SW following up with pt's husband. Pt left seated in w/c in care of NT.   Therapy Documentation Precautions:  Precautions Precautions: Fall Precaution Comments: visual neglect R side Restrictions Weight Bearing Restrictions: No  Pain:  Reports some mild back  discomfort - utilized pt's personal lumbar corset for comfort and provided seated rest breaks as needed.  Therapy/Group: Individual Therapy  Ginny Forth , PT, DPT, NCS, CSRS 06/30/2021, 3:09 PM

## 2021-06-30 NOTE — Progress Notes (Addendum)
Speech Language Pathology Daily Session Note  Patient Details  Name: Caroline [name] MRN: [id number] Date of Birth: 27-Jun-1936  Today's Date: 06/30/2021 SLP Individual Time: 1400-1445 SLP Individual Time Calculation (min): 45 min  Short Term Goals: Week 1: SLP Short Term Goal 1 (Week 1): Patient will demonstrate orientation to person/place/time/situation with mod A verbal cues to utilize external aids SLP Short Term Goal 2 (Week 1): Patient will demonstrate improved intellectual awareness by identifying at least 2 cognitive and 2 physical changes with mod A verbal cues SLP Short Term Goal 3 (Week 1): Patient will recall and demonstrate use of call bell to request assist for help with mod A verbal cues SLP Short Term Goal 4 (Week 1): Patient will use a memory book to respond to questions about events and give information with mod A verbal cues.  Skilled Therapeutic Interventions: Pt greeted resting in bed with spouse at bedside and agreeable to skilled ST intervention with focus on cognitive goals. SLP transferred pt to quiet therapy gym in wheelchair for most of today's session where there were minimal external distractions. SLP facilitated session by providing mod-to-max A for sequential working memory task using the [employer]. Patient was able to recall up to 3 novel words in same sequential order with approximately 60% accuracy and sustained attention for 3 minute intervals. Pt required mod A verbal and visual cues for visual scanning toward right visual field. Pt demonstrated fatigue toward end of session as evidenced by increased forgetfulness of task instructions each time a new set of words were presented. This also appeared attributed to breakdown in alternating attention skills. Patient was taken back to room and SLP assessed recall on call bell uses. Pt stated "I think it's used for games or something." She required max A verbal cues to recall and identify appropriate uses of call bell. Written text  "help" was placed next to red button as reminder to press for assistance. Patient was left in wheelchair with alarm activated and immediate needs within reach at end of session. Continue per current plan of care.      Pain Pain Assessment Pain Scale: 0-10 Pain Score: 0-No pain  Therapy/Group: Individual Therapy  Tamala Ser 06/30/2021, 4:04 PM

## 2021-07-01 LAB — BASIC METABOLIC PANEL
Anion gap: 7 (ref 5–15)
BUN: 19 mg/dL (ref 8–23)
CO2: 25 mmol/L (ref 22–32)
Calcium: 9 mg/dL (ref 8.9–10.3)
Chloride: 105 mmol/L (ref 98–111)
Creatinine, Ser: 0.71 mg/dL (ref 0.44–1.00)
GFR, Estimated: >60 mL/min (ref >60)
Glucose, Bld: 91 mg/dL (ref 70–99)
Potassium: 3.9 mmol/L (ref 3.5–5.1)
Sodium: 137 mmol/L (ref 135–145)

## 2021-07-01 NOTE — Progress Notes (Signed)
Physical Therapy Session Note  Patient Details  Name: Caroline [name] MRN: [id number] Date of Birth: July 04, 1936  Today's Date: 07/01/2021 PT Individual Time: 1530-1600 PT Individual Time Calculation (min): 30 min   Short Term Goals: Week 1:  PT Short Term Goal 1 (Week 1): Pt will perform sit <>stands w/CGA consistently w/LRAD and min cues PT Short Term Goal 2 (Week 1): Pt will demonstrate R head turn to attend to R visual field during functional mobility to avoid obstacles w/min cues only PT Short Term Goal 3 (Week 1): Pt will ambulate 150' w/LRAD and CGA w/min cues for AD management PT Short Term Goal 4 (Week 1): Pt will ascend/descend 4 6" steps w/1 handrail and mod A  to prepare for home environment  Skilled Therapeutic Interventions/Progress Updates: Pt presented ambulating to w/c from bathroom with NT. Pt agreeable to therapy and denies pain during session. Pt transported to rehab gym for energy conservation. Participated in ascending/descending x4 steps with B rails for BLE strengthening. Pt was able to ascend with step through pattern but descended with step to pattern with CGA both directions. Pt then practiced stepping up with RW on 5in step in preparation for home. Pt also practiced stepping backwards with RW with CGA if needed in preparation for home entry. Pt then participated in ambulation/scanning task in hallway looking for horseshoes. Pt required mod cues initially for scanning to R however with increased time pt noted to scan both L and R without cues. Pt however did require cues to recollect what item she was looking for approx halfway through task. After seated rest pt then participated in standing/reaching task with emphasis on R. During Sit to stand pt required cues throughout session for hand placement both to sit and stand. Pt then ambulated back to room with RW and CGA with pt not running into any obstacles but requiring cues for locating room even though provided with room number x  3 times. Once in room pt's family requested pt sit in recliner. Pt left in recliner with seat alarm on and family present settling patient in chair.      Therapy Documentation Precautions:  Precautions Precautions: Fall Precaution Comments: visual neglect R side Restrictions Weight Bearing Restrictions: No General:   Vital Signs: Therapy Vitals Temp: 97.6 F (36.4 C) Temp Source: Oral Pulse Rate: 80 Resp: 16 BP: (!) 112/49 Patient Position (if appropriate): Sitting Oxygen Therapy SpO2: 96 % O2 Device: Room Air Pain:   Mobility:   Locomotion :    Trunk/Postural Assessment :    Balance:   Exercises:   Other Treatments:      Therapy/Group: Individual Therapy  Avalin Briley 07/01/2021, 4:10 PM

## 2021-07-01 NOTE — Progress Notes (Signed)
Occupational Therapy Session Note  Patient Details  Name: Caroline [name] MRN: [id number] Date of Birth: [birth year]  Today's Date: 07/01/2021 OT Individual Time: 9968-9570 OT Individual Time Calculation (min): 42 min    Short Term Goals: Week 1:  OT Short Term Goal 1 (Week 1): Pt will complete LB bathing sit to stand with supervision for two consecutive sessions. OT Short Term Goal 1 - Progress (Week 1): Met OT Short Term Goal 2 (Week 1): Pt will complete LB dressing with min guard sit to stand for two consecutive sessions. OT Short Term Goal 2 - Progress (Week 1): Met OT Short Term Goal 3 (Week 1): Pt will complete tolet transfers with min guard asisst using the RW and elevated toilet with rail. OT Short Term Goal 3 - Progress (Week 1): Met OT Short Term Goal 4 (Week 1): Pt will compensate for right visual field deficit by locating at least 3 items placed right of midline during selfcare tasks with no more than min instructional cueing. OT Short Term Goal 4 - Progress (Week 1): Progressing toward goal  Skilled Therapeutic Interventions/Progress Updates:  Pt greeted supine in bed agreeable to OT intervention. Pt did not recall previous OT session or therapists name. Pt completed bed mobility with supervision. Pt reports needing to void bowels, ambulatory toilet transfer to bathroom with Rw with CGA. +for B/B, pt completed 3/3 toileting with CGA. Pt ambulated back to w/c with CGA with Rw. Pt completed dressing from w/c with set- up assist for UB dressing and set- up assist for LB dressing. Pt required total A for footwear.pt completed oral care with supervision. Pt left seated in w/c with alarm belt actiavted and all needs within reach  Therapy Documentation Precautions:  Precautions Precautions: Fall Precaution Comments: visual neglect R side Restrictions Weight Bearing Restrictions: No  Pain: pt reports no pain during session     Therapy/Group: Individual Therapy  Corinne Ports  [facility] 07/01/2021, 11:58 AM

## 2021-07-01 NOTE — Patient Care Conference (Signed)
Inpatient RehabilitationTeam Conference and Plan of Care Update Date: 07/01/2021   Time: 10:41 AM    Patient Name: Caroline [name]      Medical Record Number: [id number]  Date of Birth: 1936-05-28 Sex: Female         Room/Bed: 4W22C/4W22C-01 Payor Info: Payor: [employer]vertising copywriter MEDICARE / Plan: [employer] MEDICARE / Product Type: *No Product type* /    Admit Date/Time:  06/24/2021  1:06 PM  Primary Diagnosis:  CVA (cerebral vascular accident) [facility]  Hospital Problems: Principal Problem:   CVA (cerebral vascular accident) (HCC) Active Problems:   Dysphagia, post-stroke   Hypoalbuminemia due to protein-calorie malnutrition [facility])    Expected Discharge Date: Expected Discharge Date: 07/04/21  Team Members Present: Physician leading conference: Dr. Claudette Laws Social Worker Present: Lavera Guise, BSW Nurse Present: Chana Bode, RN PT Present: Casimiro Needle, PT OT Present: Perrin Maltese, OT SLP Present: Eilene Ghazi, SLP PPS Coordinator present : Fae Pippin, SLP     Current Status/Progress Goal Weekly Team Focus  Bowel/Bladder   Pt is incontinent of bowel and bladder  Pt will gain continence of bowel and bladder  Will assess qshift and PRN   Swallow/Nutrition/ Hydration             ADL's   s/u for UB bathing, LB dressing with CGA- S, toileting with CGA, ambulatory toilet transfer with CGA with RW.  S overall  cognition, family education, dynamic balance, R visual scanning compensatory methods, BADL reeducation   Mobility   CGA/min assist bed mobility, CGA sit<>stands and stand pivot transfers using RW, CGA gait up to 161ft using RW, min assist 4 stairs using B HRs - demonstrates significant cognitive impairments along with significant R visual field impairments impacting her safety with fuctional mobility  supervision overall at ambulatory level due to cognitive and visual impairments  activity tolerance, pt/family education, vision retraining with focus on R visual  scanning and awareness, dynamic standing balance, dynamic gait training, stair navigation training, dual-cognitive-tasks, DME training, D/C planning   Communication             Safety/Cognition/ Behavioral Observations  mod-to-max A - baseline memory deficits  min A  orientation, functional recall using external aids, basic problem solving and safety awareness   Pain   Pt is currently pain free  Pt will remain pain free  Will assess qshift and PRN   Skin   Pt's skin is intact  Pt's skin will remain intact  Will assess qshift and PRN     Discharge Planning:      Team Discussion: Patient with cognitive issues, right field cut but no strength issues post CVA. Progress limited by poor awareness, and limited remote working memory. Needed min - mod assist for cognition at baseline.  Patient on target to meet rehab goals: Currently needs supervision for upper body care and min guard for transfers. Needs cues for sequencing and CGA for use of RW. Currently mod - max for cognition. Goals for discharge set for supervision overall.  *See Care Plan and progress notes for long and short-term goals.   Revisions to Treatment Plan:  Encourage use of memory book   Working on orientation with use of external cues  Teaching Needs: Safety, 24/7 supervision, medications, transfers,, toileting, etc  Current Barriers to Discharge: Decreased caregiver support, Home enviroment access/layout, and Behavior  Possible Resolutions to Barriers: Family education with husband and daughter  Recommend hand rail installation for entry to home     Medical Summary Current  Status: Right homonymous hemianopsia, perseverates on driving , BP stable  Barriers to Discharge: Medical stability   Possible Resolutions to [employer] Focus: family training , husband needs education of supervision level, incontinent   Continued Need for Acute Rehabilitation Level of Care: The patient requires daily medical management  by a physician with specialized training in physical medicine and rehabilitation for the following reasons: Direction of a multidisciplinary physical rehabilitation program to maximize functional independence : Yes Medical management of patient stability for increased activity during participation in an intensive rehabilitation regime.: Yes Analysis of laboratory values and/or radiology reports with any subsequent need for medication adjustment and/or medical intervention. : Yes   I attest that I was present, lead the team conference, and concur with the assessment and plan of the team.   Chana Bode B 07/01/2021, 3:35 PM

## 2021-07-01 NOTE — Progress Notes (Addendum)
Speech Language Pathology Weekly Progress and Session Note  Patient Details  Name: Dedria Endres MRN: [id number] Date of Birth: 1936-03-23  Beginning of progress report period: June 24, 2021 End of progress report period: July 02, 2021  Today's Date: 07/02/2021 SLP Individual Time: 4128-7867 SLP Individual Time Calculation (min): 45 min  Short Term Goals: Week 1: SLP Short Term Goal 1 (Week 1): Patient will demonstrate orientation to person/place/time/situation with mod A verbal cues to utilize external aids SLP Short Term Goal 1 - Progress (Week 1): Met SLP Short Term Goal 2 (Week 1): Patient will demonstrate improved intellectual awareness by identifying at least 2 cognitive and 2 physical changes with mod A verbal cues SLP Short Term Goal 2 - Progress (Week 1): Met SLP Short Term Goal 3 (Week 1): Patient will recall and demonstrate use of call bell to request assist for help with mod A verbal cues SLP Short Term Goal 3 - Progress (Week 1): Met SLP Short Term Goal 4 (Week 1): Patient will use a memory book to respond to questions about events and give information with mod A verbal cues. SLP Short Term Goal 4 - Progress (Week 1): Progressing toward goal  New Short Term Goals: Week 2: SLP Short Term Goal 1 (Week 2): ST=LTG due to ELOS  Weekly Progress Updates: Patient has made slow yet functional progress with ST over the past week and has met 3 of 4 short term goals this reporting period. Progress has been limited by intermittent confusion, and decreased carry over of skill and recall secondary to severity of cognitive deficits also complicated by baseline memory deficits. Patient is currently completing basic cognitive-linguistic tasks with mod-to-max A verbal and visual cues, and max A for awareness and functional recall. Patient continues to benefit from direct cues to refer to external orientation and memory aids. Patient/family education is ongoing. Recommend continued skilled ST  intervention to maximize cognitive function and functional independence prior to discharge. Continue progression toward established LTGs set at mod assist level overall. Anticipate patient will benefit from home health SLP services.   Intensity: Minumum of 1-2 x/day, 30 to 90 minutes Frequency: 3 to 5 out of 7 days Duration/Length of Stay: 10/1 Treatment/Interventions: Cognitive remediation/compensation;Environmental controls;Internal/external aids;Cueing hierarchy;Functional tasks;Patient/family education  Daily Session Skilled Therapeutic Interventions: Pt greeted semi reclined in bed and agreeable to skilled ST intervention with focus on cognitive goals. Pt displayed incontinence of bladder as evidenced by wet brief and bed thus assisted pt to bathroom using rolling walker with CGA for ambulation, mod A for visual scanning toward right side, and sup A verbal cues for toileting hygiene. Continued to provide sup assist for visual scanning and processing for washing face, clothing management, and orienting to items on breakfast tray. SLP facilitated use of external orientation aids with min A verbal/visual cues to identify information. Pt unable to retain this information following short delay of 2 minutes with simple conversation exchange as distraction in between. Pt displayed confusion about where she slept last night and also reported she attempted to get out of bed but the bed rails stopped her. SLP re-educated on using call bell for assistance in which pt recalled uses and identified which button to press for help with min A verbal cues. Re-assessed recall of call bell at end of session in which pt required mod-to-max A for recall and was unsure of what button to press for help. She continues to exhibit inconsistent recall and intermittent confusion. Patient was left in chair with alarm  activated and immediate needs within reach at end of session. MD present for morning rounds. Continue per current  plan of care.      General    Pain  No pain  Therapy/Group: Individual Therapy  Patty Sermons 07/02/2021, 5:41 PM

## 2021-07-01 NOTE — Progress Notes (Signed)
Physical Therapy Session Note  Patient Details  Name: Caroline [name] MRN: [id number] Date of Birth: 03-18-1936  Today's Date: 07/01/2021 PT Individual Time: 1300-1358; 1530-1600 PT Individual Time Calculation (min): 58 min and 30 mins  Short Term Goals: Week 1:  PT Short Term Goal 1 (Week 1): Pt will perform sit <>stands w/CGA consistently w/LRAD and min cues PT Short Term Goal 2 (Week 1): Pt will demonstrate R head turn to attend to R visual field during functional mobility to avoid obstacles w/min cues only PT Short Term Goal 3 (Week 1): Pt will ambulate 150' w/LRAD and CGA w/min cues for AD management PT Short Term Goal 4 (Week 1): Pt will ascend/descend 4 6" steps w/1 handrail and mod A  to prepare for home environment  Skilled Therapeutic Interventions/Progress Updates:    Session 1: Patient received sitting up in recliner,agreeable to PT. Husband and dtr at bedside. She reports "some" pain in her low back, did not rate and declined pain rx. PT providing rest breaks, distractions and repositioning to assist with pain management. She was able to ambulate to wc in room with RW and CGA due to narrow spaces. Patient reportedly either seeing or talking to deceased dtr. Dtr in room able to redirect patient. PT transporting patient in wc to therapy gym for time management and energy conservation. Patient ambulating over 5" step with RW + CGA x2. She required mod verbal cues for safety. Patient continues to require Max verbal cues for attend/scanning to R with all tasks. Patient completing further R scanning tasks while standing. No LOB noted, but short memory span and poor attention to task requiring frequent redirection. Patient ambulating back to her room with RW and CGA/close supervision. Verbal cues to remain in walker and path finding. Patient returning to recliner, chair alarm on, call light within reach, family at bedside.   Session 2: Patient received sitting up in recliner, agreeable to PT. Dtr  at bedside. She denies pain. Per dtr, patient has been hearing gunshots and appears to be slightly less oriented thinking that her dog has been visiting her. Upon standing, patient became aware that she had been incontinent of urine. Patient ambulating to bathroom with RW and CGA and max verbal cues to attend to R to avoid obstacles. Patient with continent bowel movement once in bathroom. Able to complete perihygiene and clothing management with supervision. Patient ambulating back to her recliner with RW + CGA and max verbal cues. Patient with poor carryover of remembering to scan to the R. PT reiterating to dtr and patient that patient would benefit from skilled assist at home upon dc and not only support from husband. Patient and dtr verbalizing understanding. Patient remaining up in recliner, chair alarm on, call light within reach.   Therapy Documentation Precautions:  Precautions Precautions: Fall Precaution Comments: visual neglect R side Restrictions Weight Bearing Restrictions: No    Therapy/Group: Individual Therapy  Elizebeth Koller, PT, DPT, CBIS  07/01/2021, 7:29 AM

## 2021-07-01 NOTE — Progress Notes (Signed)
PROGRESS NOTE   Subjective/Complaints:  BMET reviewed results discussed wit hpt , pt just finished with ADL program   ROS: Denies CP, SOB, N/V/D   No results found. No results for input(s): WBC, HGB, HCT, PLT in the last 72 hours.  Recent Labs    07/01/21 0524  NA 137  K 3.9  CL 105  CO2 25  GLUCOSE 91  BUN 19  CREATININE 0.71  CALCIUM 9.0     Intake/Output Summary (Last 24 hours) at 07/01/2021 0913 Last data filed at 07/01/2021 0827 Gross per 24 hour  Intake 240 ml  Output --  Net 240 ml         Physical Exam: Vital Signs Blood pressure (!) 106/43, pulse 78, temperature 97.7 F (36.5 C), temperature source Oral, resp. rate 16, height $RemoveBe'5\' 4"'ZPzWzAThm$  (1.626 m), weight 62.8 kg, SpO2 93 %.   General: No acute distress Mood and affect are appropriate Heart: Regular rate and rhythm no rubs murmurs or extra sounds Lungs: Clear to auscultation, breathing unlabored, no rales or wheezes Abdomen: Positive bowel sounds, soft nontender to palpation, nondistended Extremities: No clubbing, cyanosis, or edema Skin: No evidence of breakdown, no evidence of rash   Neuro: Alert and oriented Motor: Bilateral upper extremities: 5/5 proximal distal Left lower extremity: 5/5 proximal distal Right lower extremity: Hip flexion, knee extension 4+/5, ankle dorsiflexion 5/5   Assessment/Plan: 1. Functional deficits which require 3+ hours per day of interdisciplinary therapy in a comprehensive inpatient rehab setting. Physiatrist is providing close team supervision and 24 hour management of active medical problems listed below. Physiatrist and rehab team continue to assess barriers to discharge/monitor patient progress toward functional and medical goals  Care Tool:  Bathing    Body parts bathed by patient: Right arm, Left arm, Chest, Abdomen, Front perineal area, Buttocks, Right upper leg, Left upper leg, Face, Left lower leg, Right  lower leg         Bathing assist Assist Level: Minimal Assistance - Patient > 75%     Upper Body Dressing/Undressing Upper body dressing   What is the patient wearing?: Pull over shirt    Upper body assist Assist Level: Supervision/Verbal cueing    Lower Body Dressing/Undressing Lower body dressing      What is the patient wearing?: Underwear/pull up, Pants     Lower body assist Assist for lower body dressing: Minimal Assistance - Patient > 75%     Toileting Toileting    Toileting assist Assist for toileting: Minimal Assistance - Patient > 75%     Transfers Chair/bed transfer  Transfers assist     Chair/bed transfer assist level: Contact Guard/Touching assist Chair/bed transfer assistive device: Armrests, Programmer, multimedia   Ambulation assist      Assist level: Contact Guard/Touching assist Assistive device: Walker-rolling Max distance: 171ft   Walk 10 feet activity   Assist     Assist level: Contact Guard/Touching assist Assistive device: Walker-rolling   Walk 50 feet activity   Assist    Assist level: Total Assistance - Patient < 25% Assistive device: No Device    Walk 150 feet activity   Assist    Assist level:  Total Assistance - Patient < 25% Assistive device: No Device    Walk 10 feet on uneven surface  activity   Assist Walk 10 feet on uneven surfaces activity did not occur: Safety/medical concerns         Wheelchair     Assist Is the patient using a wheelchair?: No             Wheelchair 50 feet with 2 turns activity    Assist            Wheelchair 150 feet activity     Assist          Blood pressure (!) 106/43, pulse 78, temperature 97.7 F (36.5 C), temperature source Oral, resp. rate 16, height $RemoveBe'5\' 4"'jAlFcKUfX$  (1.626 m), weight 62.8 kg, SpO2 93 %.    Medical Problem List and Plan: 1.  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 Formulized individual treatment plan based on medical history, underlying problem and comorbidities.  -DVT/anticoagulation:  Pharmaceutical: Lovenox will d/c since pt amb >200'             -antiplatelet therapy: Aspirin 81 mg daily and Plavix 75 mg day x3 weeks then Plavix alone 3. Pain Management: Tylenol as needed 4. Mood history of cognitive impairment:              -antipsychotic agents: Seroquel 25 mg nightly 5. Neuropsych: This patient is not fully capable of making decisions on her own behalf. 6. Skin/Wound Care: Routine skin checks 7. Fluids/Electrolytes/Nutrition: Routine in and outs BMP within acceptable range on 9/22 8.  Hypertension.  Avapro 150 mg daily.   Vitals:   06/30/21 2043 07/01/21 0430  BP: (!) 105/47 (!) 106/43  Pulse: 78 78  Resp: 16 16  Temp: 98.2 F (36.8 C) 97.7 F (36.5 C)  SpO2: 95% 93%   Controlled 9/28, actually running on low side , fluid intake low will check BMET in am  9.  Hyperlipidemia.  Lipitor 80 10.  Gram-negative rod UTI.  Complete course of Rocephin last dose 9/24, encourage fluid intake, still incont, will place on toileting program  11.  Hypothyroidism.  Synthroid 12.  Hypoalbuminemia  Supplement initiated on 9/23 13.  Post stroke dysphagia  D1 thins, advance diet as tolerated   LOS: 7 days A FACE TO FACE EVALUATION WAS PERFORMED  Charlett Blake 07/01/2021, 9:13 AM

## 2021-07-01 NOTE — Progress Notes (Signed)
Patient ID: Caroline [name], female   DOB: 12/11/1935, 85 y.o.   MRN: [id number] Team Conference Report to Patient/Family  Team Conference discussion was reviewed with the patient and caregiver, including goals, any changes in plan of care and target discharge date.  Patient and caregiver express understanding and are in agreement.  The patient has a target discharge date of 07/04/21.  Sw met with patient, spouse and daughter at bedside. Provided conference updates. Daughter requesting clarity on 24/7 supervision. HC resources provided. Family education scheduled Friday 8-12   J  07/01/2021, 3:13 PM  

## 2021-07-01 NOTE — Plan of Care (Signed)
  Problem: RH Simple Meal Prep Goal: LTG Patient will perform simple meal prep w/assist (OT) Description: LTG: Patient will perform simple meal prep with assistance, with/without cues (OT). Outcome: Not Applicable Flowsheets (Taken 07/01/2021 1633) LTG: Pt will perform simple meal prep with assistance level of: (Goal discharged) -- Note: Goal discharged

## 2021-07-01 NOTE — Progress Notes (Signed)
Occupational Therapy Weekly Progress Note  Patient Details  Name: Caroline [name] MRN: [id number] Date of Birth: 05/05/1936  Beginning of progress report period: June 25, 2021 End of progress report period: July 01, 2021  Today's Date: 07/01/2021  Patient has met 3 of 4 short term goals. Pt has made progress towards OT goals this reporting period with pt completing bating with set- up assist from w/c level, set- up for UB dressing, set- up for LB dressing, CGA for 3/3 toileting tasks and CGA for ambulatory toilet transfer with RW. Pt continues to present with impaired cognition, impaired balance, impaired safety awareness needing intermittent cues for safety awareness and sequencing. She also continues with moderate right visual field deficit for which she continues to require mod to max assist to compensate for.  Feel she is on target to meet established LTGs set at supervision level overall.  Recommend continued CIR level OT including family education with spouse and her daughter in preparation for discharge on 9/30.    Patient continues to demonstrate the following deficits: muscle weakness and muscle joint tightness, field cut, decreased awareness, decreased problem solving, decreased safety awareness, decreased memory, and delayed processing, and decreased standing balance, decreased postural control, and decreased balance strategies and therefore will continue to benefit from skilled OT intervention to enhance overall performance with BADL and Reduce care partner burden.  Patient progressing toward long term goals..  Continue plan of care.  OT Short Term Goals Week 2:  OT Short Term Goal 1 (Week 2): pt will recall at least one activity from previous OT session with fewer than 3 cues OT Short Term Goal 2 (Week 2): pt will complete grooming tasks with supervision  needing fewer than 3 cues for sequencing OT Short Term Goal 3 (Week 2): pt will complete shower transfer with MIN gaurd assist  with LRAD    Therapy Documentation Precautions:  Precautions Precautions: Fall Precaution Comments: visual neglect R side Restrictions Weight Bearing Restrictions: No    Therapy/Group: Individual Therapy  Caroline [name] 07/01/2021, 9:35 AM   Clyda Greener OTR/L 07/01/2021, [phone]

## 2021-07-01 NOTE — Plan of Care (Signed)
  Problem: RH Cognition - SLP Goal: RH LTG Patient will demonstrate orientation with cues Description:  LTG:  Patient will demonstrate orientation to person/place/time/situation with cues (SLP)   Flowsheets (Taken 07/01/2021 1134) LTG: Patient will demonstrate orientation using cueing (SLP): Moderate Assistance - Patient 50 - 74% Note: Goal downgraded due to slow progress   Problem: RH Awareness Goal: LTG: Patient will demonstrate awareness during functional activites type of (SLP) Description: LTG: Patient will demonstrate awareness during functional activites type of (SLP) Flowsheets (Taken 07/01/2021 1132) LTG: Patient will demonstrate awareness during cognitive/linguistic activities with assistance of (SLP): Moderate Assistance - Patient 50 - 74% Note: Goal downgraded due to slow progress

## 2021-07-02 MED ORDER — CYANOCOBALAMIN 250 MCG PO TABS: 250.0000 ug | ORAL_TABLET | Freq: Every day | ORAL | 0 refills | Status: AC

## 2021-07-02 MED ORDER — DOCUSATE SODIUM 100 MG PO CAPS: 200.0000 mg | ORAL_CAPSULE | Freq: Two times a day (BID) | ORAL | 0 refills | Status: DC

## 2021-07-02 MED ORDER — IRBESARTAN 150 MG PO TABS: 150.0000 mg | ORAL_TABLET | Freq: Every day | ORAL | 0 refills | Status: DC

## 2021-07-02 MED ORDER — LEVOTHYROXINE SODIUM 125 MCG PO TABS: 125.0000 ug | ORAL_TABLET | Freq: Every day | ORAL | 0 refills | Status: DC

## 2021-07-02 MED ORDER — CLOPIDOGREL BISULFATE 75 MG PO TABS
75.0000 mg | ORAL_TABLET | Freq: Every day | ORAL | 0 refills | Status: DC
Start: 2021-07-02 — End: 2021-07-30

## 2021-07-02 MED ORDER — VITAMIN D-3 25 MCG (1000 UT) PO CAPS: 1000.0000 [IU] | ORAL_CAPSULE | Freq: Every day | ORAL | 0 refills | Status: DC

## 2021-07-02 MED ORDER — ACETAMINOPHEN 325 MG PO TABS: 650.0000 mg | ORAL_TABLET | Freq: Four times a day (QID) | ORAL | Status: AC | PRN

## 2021-07-02 MED ORDER — QUETIAPINE FUMARATE 25 MG PO TABS: 25.0000 mg | ORAL_TABLET | Freq: Every day | ORAL | 0 refills | Status: DC

## 2021-07-02 MED ORDER — VITAMIN C 100 MG PO TABS
100.0000 mg | ORAL_TABLET | ORAL | 0 refills | Status: DC
Start: 2021-07-02 — End: 2021-12-09

## 2021-07-02 MED ORDER — ATORVASTATIN CALCIUM 80 MG PO TABS: 80.0000 mg | ORAL_TABLET | Freq: Every day | ORAL | 0 refills | Status: DC

## 2021-07-02 MED ORDER — PANTOPRAZOLE SODIUM 40 MG PO TBEC: 40.0000 mg | DELAYED_RELEASE_TABLET | Freq: Two times a day (BID) | ORAL | 0 refills | Status: DC

## 2021-07-02 MED ORDER — ASPIRIN 81 MG PO TBEC: DELAYED_RELEASE_TABLET | ORAL | 11 refills | Status: DC

## 2021-07-02 NOTE — Progress Notes (Signed)
Occupational Therapy Session Note  Patient Details  Name: Caroline [name] MRN: [id number] Date of Birth: 1935-12-20  Today's Date: 07/02/2021 OT Individual Time: 1003-1101 OT Individual Time Calculation (min): 58 min    Short Term Goals: Week 2:  OT Short Term Goal 1 (Week 2): pt will recall at least one activity from previous OT session with fewer than 3 cues OT Short Term Goal 2 (Week 2): pt will complete grooming tasks with supervision  needing fewer than 3 cues for sequencing OT Short Term Goal 3 (Week 2): pt will complete shower transfer with MIN gaurd assist with LRAD  Skilled Therapeutic Interventions/Progress Updates:    Pt in recliner to start, agreeable to completion of OT.  Began with ambulation over to the sink for oral hygiene in standing.  Min guard assist needed to complete task with use of the RW for support.  Once rested, she was pushed down to the ortho gym for work in visual scanning using the BITS.  She completed Visual Scanning Program for 2 interval so 2 mins.  First interval she averaged 7.1 seconds between blue dots with accuracy at 61%.  She again was cued to turn her eyes and her head to the right to compensated for visual field deficit.  On the second she averaged 80 % accuracy with 4.8 second average.  Also had her attempt [facility] where she had to reproduce a simple connect the dots with the original copy located on the right side of the screen.  She was able to complete 1/2.  Returned to the room via wheelchair at the end of the session with transfer to the recliner to rest with min guard and use of the RW.  Call button in reach with safety belt and phone in place.     Therapy Documentation Precautions:  Precautions Precautions: Fall Precaution Comments: right visual field deficit Restrictions Weight Bearing Restrictions: No  Pain: Pain Assessment Pain Scale: Faces Pain Score: 0-No pain ADL: See Care Tool Section for some details of mobility and  selfcare   Therapy/Group: Individual Therapy  Tramayne Sebesta OTR/L 07/02/2021, 1:50 PM

## 2021-07-02 NOTE — Progress Notes (Signed)
PROGRESS NOTE   Subjective/Complaints:  Eating well, no pain c/os, SLP observed meal which was without signs of aspiration   ROS: Denies CP, SOB, N/V/D   No results found. No results for input(s): WBC, HGB, HCT, PLT in the last 72 hours.  Recent Labs    07/01/21 0524  NA 137  K 3.9  CL 105  CO2 25  GLUCOSE 91  BUN 19  CREATININE 0.71  CALCIUM 9.0     Intake/Output Summary (Last 24 hours) at 07/02/2021 0839 Last data filed at 07/02/2021 0200 Gross per 24 hour  Intake 120 ml  Output 650 ml  Net -530 ml         Physical Exam: Vital Signs Blood pressure 122/66, pulse 74, temperature 98.3 F (36.8 C), temperature source Oral, resp. rate 14, height 5\' 4"  (1.626 m), weight 62.8 kg, SpO2 97 %.   General: No acute distress Mood and affect are appropriate Heart: Regular rate and rhythm no rubs murmurs or extra sounds Lungs: Clear to auscultation, breathing unlabored, no rales or wheezes Abdomen: Positive bowel sounds, soft nontender to palpation, nondistended Extremities: No clubbing, cyanosis, or edema Skin: No evidence of breakdown, no evidence of rash Neuro: Alert and oriented Motor: Bilateral upper extremities: 5/5 proximal distal Left lower extremity: 5/5 proximal distal Right lower extremity: Hip flexion, knee extension 4+/5, ankle dorsiflexion 5/5   Assessment/Plan: 1. Functional deficits which require 3+ hours per day of interdisciplinary therapy in a comprehensive inpatient rehab setting. Physiatrist is providing close team supervision and 24 hour management of active medical problems listed below. Physiatrist and rehab team continue to assess barriers to discharge/monitor patient progress toward functional and medical goals  Care Tool:  Bathing    Body parts bathed by patient: Right arm, Left arm, Chest, Abdomen, Front perineal area, Buttocks, Right upper leg, Left upper leg, Face, Left lower leg,  Right lower leg   Body parts bathed by helper: Face     Bathing assist Assist Level: Set up assist     Upper Body Dressing/Undressing Upper body dressing   What is the patient wearing?: Pull over shirt    Upper body assist Assist Level: Set up assist    Lower Body Dressing/Undressing Lower body dressing      What is the patient wearing?: Underwear/pull up, Pants     Lower body assist Assist for lower body dressing: Set up assist     Toileting Toileting    Toileting assist Assist for toileting: Contact Guard/Touching assist     Transfers Chair/bed transfer  Transfers assist     Chair/bed transfer assist level: Contact Guard/Touching assist Chair/bed transfer assistive device:   Ambulation assist      Assist level: Contact Guard/Touching assist Assistive device: Walker-rolling Max distance: 152ft   Walk 10 feet activity   Assist     Assist level: Contact Guard/Touching assist Assistive device: Walker-rolling   Walk 50 feet activity   Assist    Assist level: Total Assistance - Patient < 25% Assistive device: No Device    Walk 150 feet activity   Assist    Assist level: Total Assistance - Patient < 25%  Assistive device: No Device    Walk 10 feet on uneven surface  activity   Assist Walk 10 feet on uneven surfaces activity did not occur: Safety/medical concerns         Wheelchair     Assist Is the patient using a wheelchair?: No             Wheelchair 50 feet with 2 turns activity    Assist            Wheelchair 150 feet activity     Assist          Blood pressure 122/66, pulse 74, temperature 98.3 F (36.8 C), temperature source Oral, resp. rate 14, height 5\' 4"  (1.626 m), weight 62.8 kg, SpO2 97 %.    Medical Problem List and Plan: 1.  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 Pain Management: Tylenol as needed 4. Mood history of cognitive impairment:              -antipsychotic agents: Seroquel 25 mg nightly 5. Neuropsych: This patient is not fully capable of making decisions on her own behalf. 6. Skin/Wound Care: Routine skin checks 7. Fluids/Electrolytes/Nutrition: Routine in and outs BMP within acceptable range on 9/22 8.  Hypertension.  Avapro 150 mg daily.   Vitals:   07/01/21 1955 07/02/21 0434  BP: (!) 115/48 122/66  Pulse: 79 74  Resp: 14 14  Temp: 97.9 F (36.6 C) 98.3 F (36.8 C)  SpO2: 95% 97%   Controlled 9/29  actually running on low side , fluid intake lrecheck BMET normal 9/28 9.  Hyperlipidemia.  Lipitor 80 10.  Gram-negative rod UTI.  Complete course of Rocephin last dose 9/24, encourage fluid intake, still incont, will place on toileting program  11.  Hypothyroidism.  Synthroid 12.  Hypoalbuminemia  Supplement initiated on 9/23 13.  Post stroke dysphagia  Dtolerating regular diet    LOS: 8 days A FACE TO FACE EVALUATION WAS PERFORMED  10/23 07/02/2021, 8:39 AM

## 2021-07-02 NOTE — Progress Notes (Signed)
Physical Therapy Session Note  Patient Details  Name: Caroline [name] MRN: [id number] Date of Birth: Dec 17, 1935  Today's Date: 07/02/2021 PT Individual Time: 2595-6387 PT Individual Time Calculation (min): 80 min   Short Term Goals: Week 1:  PT Short Term Goal 1 (Week 1): Pt will perform sit <>stands w/CGA consistently w/LRAD and min cues PT Short Term Goal 2 (Week 1): Pt will demonstrate R head turn to attend to R visual field during functional mobility to avoid obstacles w/min cues only PT Short Term Goal 3 (Week 1): Pt will ambulate 150' w/LRAD and CGA w/min cues for AD management PT Short Term Goal 4 (Week 1): Pt will ascend/descend 4 6" steps w/1 handrail and mod A  to prepare for home environment  Skilled Therapeutic Interventions/Progress Updates:    Pt received sitting in recliner with her daughter, [name], present. Pt agreeable to therapy session. Pt's daughter declines participating in hands-on training stating that it will be pt's husband and pt's other daughter (a Physical Therapist) who will be assisting pt at D/C (pt's daughter coming to assist with initial transition home). Pt's daughter, [name], reports they have hired a full-time caregiver during the day with pt's husband caring for pt at night. Educated pt/family on recommendation for follow-up HHPT to allow intervention in pt's home environment to help with carry-over of training - pt/family in agreement.  Pt wearing her personal lumbar corset throughout session for comfort. Sit<>stands using RW with close supervision throughout session - pt demonstrates recall to push up with at least 1 hand from seat all but 1 time throughout session without cuing.  Gait training ~119ft to main therapy gym using RW with CGA progressing to close supervision - continues to require max cuing for R visual scanning to navigate to gym - intermittent question cuing for improved AD management as pt tends to push it too far forward (at baseline pt with  significant kyphotic posturing causing AD to be further forward than typical).  Discussed stair navigation (confirmed with pt's daughter that there are 2 STE without HRs) techniques. Started with B HRs ascending/descending 4 steps with reciprocal pattern on ascent and step-to on descent leading with R LE and CGA for safety. Progressed to ascending/descending with 1 HR to simulate support on door frame and R HHA with light min assist for balance. Pt able to use RW to step down from last step and could use AD to assist with the 2 steps to enter home.   Gait training ~161ft to ADL apartment using RW with close supervision - continues to require max cuing to locate doors on R side. Participated in dynamic gait training and visual scanning task to locate 6 fruit in kitchen with pt having to recall names of them (difficulty identifying cherries despite pt reporting these are her favorite) and recalling how many fruit she has collected vs left to find - max cuing throughout for safe AD management at counters.  Gait training ~218ft back to room using RW with close supervision and intermittent cuing for safe AD management. At end of session, pt left seated in recliner with needs in reach and seat belt alarm on.    Therapy Documentation Precautions:  Precautions Precautions: Fall Precaution Comments: right visual field deficit Restrictions Weight Bearing Restrictions: No   Pain:  No reports or indications of pain throughout session.   Therapy/Group: Individual Therapy  Ginny Forth , PT, DPT, NCS, CSRS 07/02/2021, 3:21 PM

## 2021-07-02 NOTE — Progress Notes (Signed)
Patient ID: Caroline [name], female   DOB: Jun 01, 1936, 85 y.o.   MRN: [id number]  [facility] agency list provided  Lavera Guise, [place] [phone]

## 2021-07-02 NOTE — Discharge Summary (Signed)
Physician Discharge Summary  Patient ID: Caroline [name] MRN: [id number] DOB/AGE: 01/04/1936 85 y.o.  Admit date: 06/24/2021 Discharge date: 07/07/2021  Discharge Diagnoses:  Principal Problem:   CVA (cerebral vascular accident) [facility]) Active Problems:   Hypothyroidism   Essential hypertension   S/P repair of paraesophageal hernia   Delirium due to multiple etiologies   Dyslipidemia   Dysphagia, post-stroke DVT prophylaxis Hypertension Hyperlipidemia Gram-negative rod UTI Hypothyroidism Post stroke dysphagia Mild cognitive impairment Chronic back pain Left lower lobe infiltrate Hypokalemia  Discharged Condition: Stable  Significant Diagnostic Studies: DG Chest 2 View  Result Date: 07/04/2021 CLINICAL DATA:  Leukocytosis. EXAM: CHEST - 2 VIEW COMPARISON:  02/17/2010 FINDINGS: Patient is slightly rotated towards the RIGHT. There is patchy infiltrate best seen on the LATERAL view in the posterior aspect of the chest, likely representing LEFT LOWER lobe infiltrate. No pulmonary edema. There is atherosclerotic calcification of the thoracic aorta. IMPRESSION: Bibasilar atelectasis.  Suspect LEFT LOWER lobe infiltrate. Electronically Signed   By: Norva Pavlov M.D.   On: 07/04/2021 18:05   CT HEAD WO CONTRAST ( )  Result Date: 07/04/2021 CLINICAL DATA:  Stroke follow-up EXAM: CT HEAD WITHOUT CONTRAST TECHNIQUE: Contiguous axial images were obtained from the base of the skull through the vertex without intravenous contrast. COMPARISON:  06/22/2021 FINDINGS: Brain: Large acute left PCA territory infarct affecting the occipital lobe. Left posterior thalamic infarct which is also recent by MRI. Advanced chronic small vessel ischemia in the hemispheric white matter with chronic lacunar infarct at the anterior right thalamus. No hydrocephalus or collection. Vascular: No hyperdense vessel or unexpected calcification. Skull: Normal. Negative for fracture or focal lesion. Sinuses/Orbits: No acute  finding. IMPRESSION: Subacute left PCA territory infarct without progression or hemorrhagic complication. Electronically Signed   By: Tiburcio Pea M.D.   On: 07/04/2021 10:38   CT HEAD WO CONTRAST ( )  Result Date: 06/22/2021 CLINICAL DATA:  Follow-up stroke EXAM: CT HEAD WITHOUT CONTRAST TECHNIQUE: Contiguous axial images were obtained from the base of the skull through the vertex without intravenous contrast. COMPARISON:  CT and MRI studies 06/18/2021 FINDINGS: Brain: No focal abnormality affects the brainstem or cerebellum. Acute infarction in the left PCA territory affecting the posteromedial temporal lobe, occipital lobe and left thalamus as seen previously, with low-density and swelling. No hemorrhage. Old ischemic changes of the thalami, basal ganglia and cerebral hemispheric white matter. No hydrocephalus or extra-axial collection. Vascular: There is atherosclerotic calcification of the major vessels at the base of the brain. Skull: Negative Sinuses/Orbits: Clear/normal Other: None IMPRESSION: Acute infarction affecting the left PCA territory as described above and as seen previously. No new or progressive finding. No hemorrhagic transformation. Extensive small-vessel ischemic changes elsewhere affecting the thalami, basal ganglia and hemispheric white matter. Electronically Signed   By: Paulina Fusi M.D.   On: 06/22/2021 11:11   CT HEAD WO CONTRAST ( )  Result Date: 06/18/2021 CLINICAL DATA:  Mental status change, unknown cause; patient with postop delirium and history of stroke. EXAM: CT HEAD WITHOUT CONTRAST TECHNIQUE: Contiguous axial images were obtained from the base of the skull through the vertex without intravenous contrast. COMPARISON:  Brain MRI 12/12/2020 (images available, report unavailable). MRA head 02/08/2018 FINDINGS: Brain: Stable central predominant generalized cerebral atrophy. There is a large acute cortical/subcortical left PCA territory infarct, predominantly within the  left temporal and occipital lobes. Associated mass effect with partial effacement of the left lateral ventricle. No midline shift. No evidence of hemorrhagic conversion. Additional infarct within the left thalamus, new from the  prior MRI of 02/11/2021 and possibly acute. Lacunar infarct within the left cerebellar hemisphere, also new from this prior examination and possibly acute (series 2, image 8). Redemonstrated chronic lacunar infarcts within the bilateral basal ganglia and right thalamus. Severe patchy and confluent hypoattenuation within the cerebral white matter, nonspecific but compatible with chronic small vessel ischemic disease. No extra-axial fluid collection. No evidence of an intracranial mass. Vascular: No hyperdense vessel.  Atherosclerotic calcifications. Skull: Normal. Negative for fracture or focal lesion. Sinuses/Orbits: There is gas within the right periorbital soft tissues and within the superior aspect of the right orbit, of unclear etiology. No significant paranasal sinus disease. Other: Scattered subcutaneous gas within the bilateral face soft tissues and posterior upper neck. Impressions 1, 2, 3 and 7 will be called to the ordering clinician or representative by the Radiologist Assistant, and communication documented in the PACS or [employer]. IMPRESSION: Large acute left posterior cerebral artery territory cortical/subcortical infarct. Local mass effect with partial effacement of the left lateral ventricle. No midline shift. No evidence of hemorrhagic conversion. Additional infarct within the left thalamus, new from the brain MRI of 02/11/2021, and possibly acute. Lacunar infarct within the left cerebellar hemisphere, also new from this prior examination and possibly acute Redemonstrated chronic lacunar infarcts within the bilateral basal ganglia and right thalamus. Severe chronic small vessel ischemic disease within the cerebral white matter. Central predominant cerebral atrophy.  There is gas within the right periorbital soft tissues, and within the superior aspect of the right orbit. There is additional scattered subcutaneous gas within the bilateral face and visualized upper posterior neck. These foci of gas are of unclear etiology, and clinical correlation is recommended. Electronically Signed   By: Jackey Loge D.O.   On: 06/18/2021 16:07   MR ANGIO HEAD WO CONTRAST  Result Date: 06/18/2021 CLINICAL DATA:  Stroke follow-up EXAM: MRI HEAD WITHOUT CONTRAST MRA HEAD WITHOUT CONTRAST MRA OF THE NECK WITHOUT AND WITH CONTRAST TECHNIQUE: Multiplanar, multi-echo pulse sequences of the brain and surrounding structures were acquired without intravenous contrast. Angiographic images of the Circle of Willis were acquired using MRA technique without intravenous contrast. Angiographic images of the neck were acquired using MRA technique without and with intravenous contrast. Carotid stenosis measurements (when applicable) are obtained utilizing NASCET criteria, using the distal internal carotid diameter as the denominator. CONTRAST:  6mL GADAVIST GADOBUTROL 1 MMOL/ML IV SOLN COMPARISON:  12/12/2020 FINDINGS: MR HEAD FINDINGS Brain: There is a large acute infarct of the left PCA territory that includes areas of dorsal left thalamus. There is a large amount of associated cytotoxic edema. No midline shift. No acute or chronic hemorrhage. Confluent hyperintense T2-weighted white matter signal. Diffuse, severe atrophy. The midline structures are normal. There are old left cerebellar and right thalamic small vessel infarcts. Vascular: Major flow voids are preserved. Skull and upper cervical spine: Normal calvarium and skull base. Visualized upper cervical spine and soft tissues are normal. Sinuses/Orbits:No paranasal sinus fluid levels or advanced mucosal thickening. No mastoid or middle ear effusion. Normal orbits. MRA HEAD FINDINGS POSTERIOR CIRCULATION: --Vertebral arteries: Normal --Inferior  cerebellar arteries: Poor visualization of the left PICA. --Basilar artery: Normal. --Superior cerebellar arteries: Normal. --Posterior cerebral arteries: Occlusion of the left PCA at the distal P2 segment. Right PCA is normal. ANTERIOR CIRCULATION: --Intracranial internal carotid arteries: Normal. --Anterior cerebral arteries (ACA): Normal. --Middle cerebral arteries (MCA): Normal. ANATOMIC VARIANTS: None MRA NECK FINDINGS Aortic arch: Normal branching pattern. Right carotid system: Normal Left carotid system: There is mild narrowing at  the carotid bifurcation without hemodynamically significant stenosis. Vertebral arteries: Codominant and normal. Other: None. IMPRESSION: 1. Large acute left PCA territory infarct without hemorrhage or mass effect. 2. Occlusion of the left PCA at the distal P2 segment. 3. Severe atrophy and chronic small vessel ischemia. 4. Mild narrowing of the right carotid bifurcation without hemodynamically significant stenosis by NASCET criteria. Electronically Signed   By: Deatra Robinson M.D.   On: 06/18/2021 22:16   MR ANGIO NECK W WO CONTRAST  Result Date: 06/18/2021 CLINICAL DATA:  Stroke follow-up EXAM: MRI HEAD WITHOUT CONTRAST MRA HEAD WITHOUT CONTRAST MRA OF THE NECK WITHOUT AND WITH CONTRAST TECHNIQUE: Multiplanar, multi-echo pulse sequences of the brain and surrounding structures were acquired without intravenous contrast. Angiographic images of the Circle of Willis were acquired using MRA technique without intravenous contrast. Angiographic images of the neck were acquired using MRA technique without and with intravenous contrast. Carotid stenosis measurements (when applicable) are obtained utilizing NASCET criteria, using the distal internal carotid diameter as the denominator. CONTRAST:  53mL GADAVIST GADOBUTROL 1 MMOL/ML IV SOLN COMPARISON:  12/12/2020 FINDINGS: MR HEAD FINDINGS Brain: There is a large acute infarct of the left PCA territory that includes areas of dorsal left  thalamus. There is a large amount of associated cytotoxic edema. No midline shift. No acute or chronic hemorrhage. Confluent hyperintense T2-weighted white matter signal. Diffuse, severe atrophy. The midline structures are normal. There are old left cerebellar and right thalamic small vessel infarcts. Vascular: Major flow voids are preserved. Skull and upper cervical spine: Normal calvarium and skull base. Visualized upper cervical spine and soft tissues are normal. Sinuses/Orbits:No paranasal sinus fluid levels or advanced mucosal thickening. No mastoid or middle ear effusion. Normal orbits. MRA HEAD FINDINGS POSTERIOR CIRCULATION: --Vertebral arteries: Normal --Inferior cerebellar arteries: Poor visualization of the left PICA. --Basilar artery: Normal. --Superior cerebellar arteries: Normal. --Posterior cerebral arteries: Occlusion of the left PCA at the distal P2 segment. Right PCA is normal. ANTERIOR CIRCULATION: --Intracranial internal carotid arteries: Normal. --Anterior cerebral arteries (ACA): Normal. --Middle cerebral arteries (MCA): Normal. ANATOMIC VARIANTS: None MRA NECK FINDINGS Aortic arch: Normal branching pattern. Right carotid system: Normal Left carotid system: There is mild narrowing at the carotid bifurcation without hemodynamically significant stenosis. Vertebral arteries: Codominant and normal. Other: None. IMPRESSION: 1. Large acute left PCA territory infarct without hemorrhage or mass effect. 2. Occlusion of the left PCA at the distal P2 segment. 3. Severe atrophy and chronic small vessel ischemia. 4. Mild narrowing of the right carotid bifurcation without hemodynamically significant stenosis by NASCET criteria. Electronically Signed   By: Deatra Robinson M.D.   On: 06/18/2021 22:16   MR BRAIN WO CONTRAST  Result Date: 06/18/2021 CLINICAL DATA:  Stroke follow-up EXAM: MRI HEAD WITHOUT CONTRAST MRA HEAD WITHOUT CONTRAST MRA OF THE NECK WITHOUT AND WITH CONTRAST TECHNIQUE: Multiplanar,  multi-echo pulse sequences of the brain and surrounding structures were acquired without intravenous contrast. Angiographic images of the Circle of Willis were acquired using MRA technique without intravenous contrast. Angiographic images of the neck were acquired using MRA technique without and with intravenous contrast. Carotid stenosis measurements (when applicable) are obtained utilizing NASCET criteria, using the distal internal carotid diameter as the denominator. CONTRAST:  32mL GADAVIST GADOBUTROL 1 MMOL/ML IV SOLN COMPARISON:  12/12/2020 FINDINGS: MR HEAD FINDINGS Brain: There is a large acute infarct of the left PCA territory that includes areas of dorsal left thalamus. There is a large amount of associated cytotoxic edema. No midline shift. No acute or chronic hemorrhage.  Confluent hyperintense T2-weighted white matter signal. Diffuse, severe atrophy. The midline structures are normal. There are old left cerebellar and right thalamic small vessel infarcts. Vascular: Major flow voids are preserved. Skull and upper cervical spine: Normal calvarium and skull base. Visualized upper cervical spine and soft tissues are normal. Sinuses/Orbits:No paranasal sinus fluid levels or advanced mucosal thickening. No mastoid or middle ear effusion. Normal orbits. MRA HEAD FINDINGS POSTERIOR CIRCULATION: --Vertebral arteries: Normal --Inferior cerebellar arteries: Poor visualization of the left PICA. --Basilar artery: Normal. --Superior cerebellar arteries: Normal. --Posterior cerebral arteries: Occlusion of the left PCA at the distal P2 segment. Right PCA is normal. ANTERIOR CIRCULATION: --Intracranial internal carotid arteries: Normal. --Anterior cerebral arteries (ACA): Normal. --Middle cerebral arteries (MCA): Normal. ANATOMIC VARIANTS: None MRA NECK FINDINGS Aortic arch: Normal branching pattern. Right carotid system: Normal Left carotid system: There is mild narrowing at the carotid bifurcation without  hemodynamically significant stenosis. Vertebral arteries: Codominant and normal. Other: None. IMPRESSION: 1. Large acute left PCA territory infarct without hemorrhage or mass effect. 2. Occlusion of the left PCA at the distal P2 segment. 3. Severe atrophy and chronic small vessel ischemia. 4. Mild narrowing of the right carotid bifurcation without hemodynamically significant stenosis by NASCET criteria. Electronically Signed   By: Deatra Robinson M.D.   On: 06/18/2021 22:16   DG UGI W SINGLE CM (SOL OR THIN BA)  Result Date: 06/16/2021 CLINICAL DATA:  Status post paraesophageal hernia repair 1 day prior. EXAM: WATER SOLUBLE UPPER GI SERIES TECHNIQUE: Single-column upper GI series was performed using water soluble contrast. CONTRAST:  Water-soluble iodinated oral contrast. COMPARISON:  02/13/2021 upper GI. FLUOROSCOPY TIME:  Fluoroscopy Time:  1 minute 24 seconds Radiation Exposure Index (if provided by the fluoroscopic device): 20.6 mGy Number of Acquired Spot Images: 4 FINDINGS: Interval repair of hiatal hernia, with no evidence of residual or recurrent hiatal hernia. Mild to moderate esophageal dysmotility characterized by intermittent weakening of primary peristalsis throughout the thoracic esophagus. Normal esophageal distensibility with no gross evidence of esophageal mass, ulcer or stricture. Stomach is nondistended. Normal gastric emptying. No gastric fold thickening, filling defects or ulcers. No evidence of extraluminal contrast leak. IMPRESSION: Satisfactory appearance status post repair of hiatal hernia, with no evidence of residual or recurrent hiatal hernia. No evidence of extraluminal contrast leak. Stomach is nondistended with normal contrast emptying from the stomach. Nonspecific mild-to-moderate esophageal dysmotility. Electronically Signed   By: Delbert Phenix M.D.   On: 06/16/2021 10:07   ECHOCARDIOGRAM COMPLETE BUBBLE STUDY  Result Date: 06/19/2021    ECHOCARDIOGRAM REPORT   Patient Name:    Caroline [name] Date of Exam: 06/19/2021 Medical Rec #:  [id number]  Height:       64.0 in Accession #:    [id number] Weight:       139.1 lb Date of Birth:  13-Mar-1936  BSA:          1.677 m Patient Age:    85 years   BP:           145/74 mmHg Patient Gender: F          HR:           80 bpm. Exam Location:  Inpatient Procedure: Saline Contrast Bubble Study, 2D Echo, Cardiac Doppler and Color            Doppler Indications:    Stroke  History:        Patient has prior history of Echocardiogram examinations, most  recent 02/09/2018. Stroke, Signs/Symptoms:Altered Mental Status                 and Alzheimer's; Risk Factors:Dyslipidemia and Hypertension.  Sonographer:    Sheralyn Boatman RDCS Referring Phys: [id number] HUNTER J COLLINS  Sonographer Comments: Technically difficult study due to poor echo windows. IMPRESSIONS  1. Left ventricular ejection fraction, by estimation, is 60 to 65%. The left ventricle has normal function. The left ventricle has no regional wall motion abnormalities. There is mild concentric left ventricular hypertrophy. Left ventricular diastolic parameters are consistent with Grade I diastolic dysfunction (impaired relaxation).  2. Right ventricular systolic function is normal. The right ventricular size is normal. There is normal pulmonary artery systolic pressure.  3. The mitral valve is degenerative. Mild mitral valve regurgitation.  4. The aortic valve is tricuspid. Aortic valve regurgitation is mild to moderate. Mild aortic valve sclerosis is present, with no evidence of aortic valve stenosis.  5. The inferior vena cava is normal in size with greater than 50% respiratory variability, suggesting right atrial pressure of 3 mmHg.  6. Agitated saline contrast bubble study was negative, with no evidence of any interatrial shunt. Comparison(s): A prior study was performed on 02/09/2018. Aortic regurgitation is more prominent. FINDINGS  Left Ventricle: Left ventricular ejection fraction, by estimation,  is 60 to 65%. The left ventricle has normal function. The left ventricle has no regional wall motion abnormalities. The left ventricular internal cavity size was normal in size. There is  mild concentric left ventricular hypertrophy. Left ventricular diastolic parameters are consistent with Grade I diastolic dysfunction (impaired relaxation). Right Ventricle: The right ventricular size is normal. No increase in right ventricular wall thickness. Right ventricular systolic function is normal. There is normal pulmonary artery systolic pressure. The tricuspid regurgitant velocity is 2.23 m/s, and  with an assumed right atrial pressure of 3 mmHg, the estimated right ventricular systolic pressure is 22.9 mmHg. Left Atrium: Left atrial size was normal in size. Right Atrium: Right atrial size was normal in size. Pericardium: Trivial pericardial effusion is present. Mitral Valve: The mitral valve is degenerative in appearance. Mild mitral valve regurgitation. The mean mitral valve gradient is 2.0 mmHg with average heart rate of 76 bpm. Tricuspid Valve: The tricuspid valve is normal in structure. Tricuspid valve regurgitation is mild. Aortic Valve: The aortic valve is tricuspid. Aortic valve regurgitation is mild to moderate. Mild aortic valve sclerosis is present, with no evidence of aortic valve stenosis. Aortic valve mean gradient measures 5.0 mmHg. Aortic valve peak gradient measures 10.0 mmHg. Aortic valve area, by VTI measures 2.64 cm. Pulmonic Valve: The pulmonic valve was not well visualized. Pulmonic valve regurgitation is not visualized. No evidence of pulmonic stenosis. Aorta: The aortic root and ascending aorta are structurally normal, with no evidence of dilitation. Venous: The inferior vena cava is normal in size with greater than 50% respiratory variability, suggesting right atrial pressure of 3 mmHg. IAS/Shunts: The atrial septum is grossly normal. Agitated saline contrast was given intravenously to evaluate  for intracardiac shunting. Agitated saline contrast bubble study was negative, with no evidence of any interatrial shunt.  LEFT VENTRICLE PLAX 2D LVIDd:         3.80 cm     Diastology LVIDs:         2.60 cm     LV e' medial:    4.46 cm/s LV PW:         1.10 cm     LV E/e' medial:  15.3 LV IVS:  1.10 cm     LV e' lateral:   5.66 cm/s LVOT diam:     1.90 cm     LV E/e' lateral: 12.0 LV SV:         77 LV SV Index:   46 LVOT Area:     2.84 cm  LV Volumes (MOD) LV vol d, MOD A2C: 44.5 ml LV vol d, MOD A4C: 48.3 ml LV vol s, MOD A2C: 14.9 ml LV vol s, MOD A4C: 10.7 ml LV SV MOD A2C:     29.6 ml LV SV MOD A4C:     48.3 ml LV SV MOD BP:      32.7 ml RIGHT VENTRICLE            IVC RV S prime:     8.49 cm/s  IVC diam: 1.80 cm TAPSE (M-mode): 2.0 cm LEFT ATRIUM             Index       RIGHT ATRIUM          Index LA diam:        2.20 cm 1.31 cm/m  RA Area:     5.64 cm LA Vol (A2C):   22.2 ml 13.24 ml/m RA Volume:   6.71 ml  4.00 ml/m LA Vol (A4C):   31.2 ml 18.61 ml/m LA Biplane Vol: 27.5 ml 16.40 ml/m  AORTIC VALVE AV Area (Vmax):    2.93 cm AV Area (Vmean):   2.60 cm AV Area (VTI):     2.64 cm AV Vmax:           158.00 cm/s AV Vmean:          108.000 cm/s AV VTI:            0.290 m AV Peak Grad:      10.0 mmHg AV Mean Grad:      5.0 mmHg LVOT Vmax:         163.00 cm/s LVOT Vmean:        98.900 cm/s LVOT VTI:          0.270 m LVOT/AV VTI ratio: 0.93  AORTA Ao Root diam: 3.90 cm Ao Asc diam:  3.40 cm MITRAL VALVE                TRICUSPID VALVE MV Area (PHT): 3.65 cm     TR Peak grad:   19.9 mmHg MV Mean grad:  2.0 mmHg     TR Vmax:        223.00 cm/s MV Decel Time: 208 msec MV E velocity: 68.10 cm/s   SHUNTS MV A velocity: 108.00 cm/s  Systemic VTI:  0.27 m MV E/A ratio:  0.63         Systemic Diam: 1.90 cm Caroline Lam MD Electronically signed by Caroline Lam MD Signature Date/Time: 06/19/2021/5:07:15 PM    Final    DG HIP UNILAT WITH PELVIS 2-3 VIEWS LEFT  Result Date: 07/04/2021 CLINICAL  DATA:  Acute left hip pain. EXAM: DG HIP (WITH OR WITHOUT PELVIS) 2-3V LEFT COMPARISON:  None. FINDINGS: There is no evidence of hip fracture or dislocation. There is no evidence of arthropathy or other focal bone abnormality. IMPRESSION: Negative. Electronically Signed   By: Gerome Sam III M.D.   On: 07/04/2021 12:20    Labs:  Basic Metabolic Panel: Recent Labs  Lab 07/01/21 0524 07/04/21 0955 07/06/21 0502 07/07/21 0411  NA 137 137 133* 135  K 3.9 3.5 2.7* 3.7  CL 105 104 100 102  CO2 GLUCOSE 91 118* 112* 95  BUN CREATININE 0.71 0.69 0.58 0.68  CALCIUM 9.0 9.2 8.3* 8.6*  MG  --   --   --  1.9    CBC: Recent Labs  Lab 07/04/21 0955 07/06/21 0502 07/07/21 0411  WBC 11.5* 12.0* 10.4  NEUTROABS 8.9* 10.1* 7.7  HGB 11.6* 9.9* 9.9*  HCT 36.1 29.5* 30.7*  MCV 88.7 85.8 87.2  PLT 324 258 254    CBG: No results for input(s): GLUCAP in the last 168 hours.  Family history.  Mother with hypertension.  Maternal uncle with cancer unknown.  Negative colon cancer stomach cancer esophageal cancer pancreatic cancer  Brief HPI:   Caroline [name] is a 85 y.o. right-handed female with history of prior right thalamic CVA 2019 maintained on Plavix mild cognitive impairment chronic back pain history of DVT paraesophageal hernia hypertension hyperlipidemia.  Per chart review lives with spouse.  Independent prior to admission able to perform her own ADLs.  [facility]ed to [facility] 06/15/2021 with intermittent heartburn and episodes of severe regurgitation.  She had an endoscopy February 2020 confirming grade D reflux esophagitis and again June 2020 with little improvement in endoscopic findings on high-dose PPI.  Biopsies were negative.  She had a normal gastric emptying study 2020.  Followed by general surgery patient underwent robotic paraesophageal hernia repair gastropexy upper endoscopy 06/15/2021 per Dr. Doylene Canard.  Her diet was slowly advanced.  Post procedure  noted somnolence increased disorientation CT/MRI showed large acute left PCA territory infarct without hemorrhage or mass-effect.  Patient did not receive tPA.  MRA showed occlusion of the left PCA at the distal P2 segment.  Severe atrophy and chronic small vessel ischemia.  Echocardiogram with ejection fraction of 60 to 65% grade 1 diastolic dysfunction.  Neurology follow-up aspirin Plavix as directed.  Lovenox for DVT prophylaxis.  Preliminary urine culture 100,000 gram-negative rod maintained on IV Rocephin.  Therapy evaluations completed due to patient decreased functional mobility was admitted for a comprehensive rehab program.   Hospital Course: Caroline [name] was admitted to rehab 06/24/2021 for inpatient therapies to consist of PT, ST and OT at least three hours five days a week. Past admission physiatrist, therapy team and rehab RN have worked together to provide customized collaborative inpatient rehab.  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  She had been on Lovenox for DVT prophylaxis discontinued ambulating greater than 200 feet.  She would follow-up neurology services.  Blood pressure soft  on Avapro although she did have 1 episode of orthostasis thought to be vasovagal related to bowel movement and her Avapro was decreased to 75 mg a day and later discontinued with ongoing monitoring.  Lipitor ongoing for hyperlipidemia.  She completed a course of antibiotic therapy for gram-negative rod UTI.  Synthroid ongoing for hypothyroidism.  In regards to patient's robotic paraesophageal hernia repair she would follow-up with general surgery.  Chest x-ray 07/04/2021 due to mild leukocytosis showed suspected left lower lobe infiltrate placed on intravenous Rocephin transitioned to Omnicef x4 days.  Urine culture 07/04/2021 showed no growth.  Medical  hospitalist team consulted in regards to patient's chest x-ray findings for any further recommendations.  Patient did have some hypokalemia will supplement added.  Her diet has been advanced to regular consistency.   Blood  pressures were monitored on TID basis and soft and monitored     Rehab course: During patient's stay in rehab weekly team conferences were held to monitor patient's progress, set goals and discuss barriers to discharge. At admission, patient required minimal assist under 20 feet rolling walker moderate assist sit to supine  Physical exam.  Blood pressure 148/64 pulse 78 temperature 98 respiration 20 oxygen saturation 95% room air Constitutional.  No acute distress HEENT Head.  Normocephalic and atraumatic Eyes.  Pupils round and reactive to light no discharge without nystagmus Neck.  Supple nontender no JVD without thyromegaly Cardiac regular rate rhythm any extra sounds or murmur heard Abdomen.  Soft nontender positive bowel sounds without rebound Respiratory effort normal no respiratory distress without wheeze Skin.  Warm and dry Neurologic.  Alert follows commands provides name place and month but needed multiple cues for year and situation. Motor right upper extremity 4+/5 proximal to distal Right lower extremity 4/5 proximal to distal Left upper left lower extremity 5/5 proximal to distal  He/She  has had improvement in activity tolerance, balance, postural control as well as ability to compensate for deficits. He/She has had improvement in functional use RUE/LUE  and RLE/LLE as well as improvement in awareness.  Participated in ascending and descending stairs bilateral rails contact-guard.  Ambulates extended distances with assistive device.  Working with energy conservation.  Patient practiced stepping backwards with rolling walker contact-guard assist.  She did need some occasional cues for safety.  Completed bathing with set up assistance.  Set up for upper body  dressing and lower body.  Contact-guard for toileting tasks.  Speech therapy facilitated sessions by providing moderate to max assist for sequential working memory tasks.  Patient was able to recall up to 3 novel words in same sequential order with approximately 60% accuracy and sustained attention for 3-minute intervals.  It was discussed with family need for supervision for safety on discharge.  Full family teaching completed and discharged to home       Disposition: Discharged to home   Diet: Regular  Special Instructions: No driving smoking or alcohol  Medications at discharge 1.  Tylenol as needed 2.  Aspirin 81 mg p.o. daily x7 days and stop 3.  Lipitor 80 mg p.o. nightly 4.  Plavix 25 mg p.o. daily 5.  Colace 200 mg p.o. twice daily 6.  Avapro 75 mg p.o. daily 7.  Synthroid 125 mcg p.o. daily 8.  Protonix 40 mg p.o. twice daily 9.  Seroquel 25 mg p.o. nightly 10.  Vitamin B12 250 mcg p.o. daily 11.  Omnicef 300 mg every 12 hours x4 days 12.Potassium 99 mg daily  30-35 minutes were spent completing discharge summary and discharge planning  Discharge Instructions     Ambulatory referral to Neurology   Complete by: As directed    An appointment is requested in approximately: 4 weeks left PCA infarction   Ambulatory referral to Physical Medicine Rehab   Complete by: As directed    Moderate complexity follow-up 1 to 2 weeks left PCA infarction        Follow-up Information     Kirsteins, Victorino Sparrow, MD Follow up.   Specialty: Physical Medicine and Rehabilitation Why: Office to call for appointment Contact information: [address] [place] [address] [place] [address] [phone]                 Signed: Charlton Amor 07/07/2021, 5:46 AM

## 2021-07-02 NOTE — Progress Notes (Signed)
Physical Therapy Session Note  Patient Details  Name: Caroline [name] MRN: [id number] Date of Birth: 1936/03/12  Today's Date: 07/02/2021 PT Individual Time: 4193-7902 PT Individual Time Calculation (min): 24 min   Short Term Goals: Week 1:  PT Short Term Goal 1 (Week 1): Pt will perform sit <>stands w/CGA consistently w/LRAD and min cues PT Short Term Goal 2 (Week 1): Pt will demonstrate R head turn to attend to R visual field during functional mobility to avoid obstacles w/min cues only PT Short Term Goal 3 (Week 1): Pt will ambulate 150' w/LRAD and CGA w/min cues for AD management PT Short Term Goal 4 (Week 1): Pt will ascend/descend 4 6" steps w/1 handrail and mod A  to prepare for home environment  Skilled Therapeutic Interventions/Progress Updates:  Pt received seated in recliner in room, denied pain, co-treat w/TR. Donned pt's ted hose and shoes w/max A Sit <>stand from recliner to RW and pt ambulated >250' w/RW and CGA. Mod verbal cues to maintain safe distance to RW and to attend to R visual field. Pt perseverated on her mask having "funny smell" throughout walk and pt attempted to remove it while walking, min A to avoid tripping on RW. Allowed pt to remove mask during to redirect attention to walking task. Upon return to room, pt was left seated in recliner and assisted pt in filling out memory notebook. Pt was left seated in recliner, safety belt on, all needs in reach.   Therapy Documentation Precautions:  Precautions Precautions: Fall Precaution Comments: visual neglect R side Restrictions Weight Bearing Restrictions: No  Therapy/Group: Individual Therapy Jill Alexanders Shontia Gillooly, PT, DPT  07/02/2021, 7:47 AM

## 2021-07-03 NOTE — Progress Notes (Signed)
Physical Therapy Session Note  Patient Details  Name: Caroline [name] MRN: [id number] Date of Birth: 08/18/1936  Today's Date: 07/03/2021 PT Individual Time: 8144-8185 PT Individual Time Calculation (min): 30 min   Short Term Goals: Week 1:  PT Short Term Goal 1 (Week 1): Pt will perform sit <>stands w/CGA consistently w/LRAD and min cues PT Short Term Goal 2 (Week 1): Pt will demonstrate R head turn to attend to R visual field during functional mobility to avoid obstacles w/min cues only PT Short Term Goal 3 (Week 1): Pt will ambulate 150' w/LRAD and CGA w/min cues for AD management PT Short Term Goal 4 (Week 1): Pt will ascend/descend 4 6" steps w/1 handrail and mod A  to prepare for home environment  Skilled Therapeutic Interventions/Progress Updates:     Pt received sitting on and agreeable to PT. Pt performed pericare following continent bowel and bladder movement with supervision assist. Sit<>stand with supervision assist and BUE support on RW. Pt reports mild fatigue back pain once in standing. Attempted ambulatory transfer to [facility], but but reports increasing dizziness/ light-headedness. BLE collapse requiring total A to prevent fall. PT able to transfer pt to sitting EOB with total A. Pt reports no change in symptoms in stting at EOB. Max assist into supine in bed from PT and husband. Once in supine, pt noted to be holding chest and reporting pain/tightness to this PT. PT assess BP 129/51, HR 90, SpO2 92%. RN made aware and then present to perform evaluation. PA then notified of pt condition. Pt left supine in bed with NT present in room.    Therapy Documentation Precautions:  Precautions Precautions: Fall Precaution Comments: right visual field deficit Restrictions Weight Bearing Restrictions: No General: PT Amount of Missed Time (min): 45 Minutes PT Missed Treatment Reason: Patient ill (Comment) (chest pains) Vital Signs: Therapy Vitals Temp: 97.7 F (36.5 C) Temp Source:  Oral Pulse Rate: 95 Resp: 16 BP: (!) 108/56 Patient Position (if appropriate): Lying Oxygen Therapy SpO2: 92 % O2 Device: Room Air Pain: Pain Assessment Pain Scale: Faces Faces Pain Scale: Hurts little more Pain Location: Chest Pain Orientation: Medial Pain Descriptors / Indicators: Pressure    Therapy/Group: Individual Therapy  [name] 07/03/2021, 11:41 AM

## 2021-07-03 NOTE — Progress Notes (Signed)
Discharge Inpatient Rehabilitation Medication Review by a Pharmacist  A complete drug regimen review was completed for this patient to identify any potential clinically significant medication issues.  High Risk Drug Classes Is patient taking? Indication by Medication  Antipsychotic Yes Seroquel for h/o cognitive impairment.  Anticoagulant No   Antibiotic No   Opioid No   Antiplatelet Yes ASA, Plavix for CVA  Hypoglycemics/insulin No   Vasoactive Medication Yes Avapro for HTN  Chemotherapy No   Other No     Clinically significant medication issues were identified that warrant physician communication and completion of prescribed/recommended actions by midnight of the next day:  No  Time spent performing this drug regimen review (minutes):   Zale Marcotte S. Merilynn Finland, PharmD, BCPS Clinical Staff Pharmacist [email or web address]  Pasty Spillers 07/03/2021 1:29 PM

## 2021-07-03 NOTE — Progress Notes (Signed)
S: Patient seen in rehab for her post-op check. She and her husband report she has been tolerating a regular diet, eating well, without reflux or return of preop symptoms of n/v. Denies abdominal pain  O: Vitals, labs, intake/output, and orders reviewed at this time.   Gen: Alert, calm Unlabored respirations Abdomen soft and nontender, incisions healing well  A/P: POD 20 s/p robotic paraesophageal hernia repair with gastropexy, c/b periop PCA stroke Recovering well with good improvement in preop GI symptoms Tolerating regular diet Planning DC home tomorrow. She can follow up with me as needed.     Phylliss Blakes, MD [facility], [place]

## 2021-07-03 NOTE — Progress Notes (Signed)
Occupational Therapy Discharge Summary  Patient Details  Name: Caroline [name] MRN: [id number] Date of Birth: 1936-05-13  Today's Date: 07/03/2021 OT Individual Time: 0801-0900 OT Individual Time Calculation (min): 59 min   Session Note:  Pt's spouse present for session with completion of selfcare tasks at shower level.  Pt was able to transfer to the EOB with supervision and then complete sit to stand at the same level from the slightly elevated bed.  She then completed functional mobility to the shower bench at supervision with use of the RW.  Min instructional cueing to scan right of midline and avoid obstacles such as the door frame.  She was able to remove clothing with min guard assist and then complete shower with her spouse observing.  Provided instruction to allow pt time to complete task and then ask questioning cueing to see if she can problem solve what to do next instead of just giving her the answers.  She was able to complete bathing at supervision level with min instructional cueing for thoroughness.  She then transferred over to the 3:1 with use of the RW for dressing tasks.  Increased difficulty threading her brief this am secondary to the [facility] being too high.  She was able to thread her pants however and stand to pull them up over her hips with supervision.  She donned her bra with setup as well as a pullover shirt.  Therapist assisted with donning TEDs and then she donned her slip on shoes.  Finished session with transfer out to the bedside recliner with the call button and phone in reach and safety alarm in place.    Patient has met 10 of 12 long term goals due to improved activity tolerance, improved balance, and ability to compensate for deficits.  Patient to discharge at overall Supervision level.  Patient's care partner is independent to provide the necessary physical and cognitive assistance at discharge.    Reasons goals not met: Pt continues to need max instructional cueing for  memory from day to day.  Min assist is needed for LB dressing tasks sit to stand.    Recommendation:  Patient will benefit from ongoing skilled OT services in home health setting to continue to advance functional skills in the area of BADL and Reduce care partner burden.  Pt will continue to benefit from follow-up HHOT to further increase ADL independence back to a modified independent level and increase overall strength.    Equipment: No equipment provided  Reasons for discharge: treatment goals met and discharge from hospital  Patient/family agrees with progress made and goals achieved: Yes  OT Discharge Precautions/Restrictions  Precautions Precautions: Fall Precaution Comments: right visual field deficit Restrictions Weight Bearing Restrictions: No  Pain Pain Assessment Pain Scale: 0-10 Pain Score: 0-No pain ADL ADL Eating: Independent Where Assessed-Eating: Chair Grooming: Independent Where Assessed-Grooming: Chair Upper Body Bathing: Setup Where Assessed-Upper Body Bathing: Chair, Shower Lower Body Bathing: Supervision/safety Where Assessed-Lower Body Bathing: Shower Upper Body Dressing: Setup Where Assessed-Upper Body Dressing: Chair Lower Body Dressing: Minimal assistance Where Assessed-Lower Body Dressing: Chair Toileting: Supervision/safety Where Assessed-Toileting: Bedside Commode Toilet Transfer: Close supervision Toilet Transfer Method: Counselling psychologist: Radiographer, therapeutic: Not assessed Social research officer, government: Close supervision Social research officer, government Method: Heritage manager: Civil engineer, contracting with back Vision Baseline Vision/History: 1 Wears glasses Patient Visual Report: Peripheral vision impairment Vision Assessment?: Yes Eye Alignment: Within Functional Limits Ocular Range of Motion: Within Functional Limits Alignment/Gaze Preference: Within Defined Limits Tracking/Visual Pursuits:  Decreased  smoothness of horizontal tracking;Decreased smoothness of vertical tracking Visual Fields: Right homonymous hemianopsia Perception  Perception: Impaired Inattention/Neglect: Does not attend to right visual field Praxis Praxis: Intact Praxis Impairment Details: Initiation;Motor planning Cognition Overall Cognitive Status: Impaired/Different from baseline Arousal/Alertness: Awake/alert Orientation Level: Oriented to person;Oriented to place;Disoriented to time;Disoriented to situation Year: Other (Comment) [phone]) Month: April Day of Week: Incorrect Attention: Selective Focused Attention: Appears intact Sustained Attention: Appears intact Selective Attention: Impaired Selective Attention Impairment: Verbal basic;Functional basic Memory: Impaired Memory Impairment: Storage deficit;Retrieval deficit;Decreased short term memory;Decreased recall of new information;Decreased long term memory Decreased Long Term Memory: Verbal basic Decreased Short Term Memory: Verbal basic;Functional basic Immediate Memory Recall: Sock;Blue;Bed Memory Recall Sock: Not able to recall Memory Recall Blue: Not able to recall Memory Recall Bed: Not able to recall Awareness: Impaired Awareness Impairment: Intellectual impairment Problem Solving: Impaired Problem Solving Impairment: Functional basic;Verbal basic Executive Function: Sequencing;Organizing;Self Monitoring;Self Correcting;Reasoning Reasoning: Impaired Reasoning Impairment: Verbal basic Sequencing: Impaired Sequencing Impairment: Verbal basic Organizing: Impaired Organizing Impairment: Verbal basic Self Monitoring: Impaired Self Monitoring Impairment: Verbal basic;Functional basic Self Correcting: Impaired Self Correcting Impairment: Verbal basic;Functional basic Behaviors: Perseveration Safety/Judgment: Impaired Sensation Sensation Light Touch: Appears Intact Hot/Cold: Impaired Detail Hot/Cold Impaired Details: Impaired  RUE Proprioception: Appears Intact Stereognosis: Appears Intact Additional Comments: Pt reports difference in sensation when testing the water in the shower with the RUE compared to the left Coordination Gross Motor Movements are Fluid and Coordinated: Yes Fine Motor Movements are Fluid and Coordinated: Yes Coordination and Movement Description: BUE coordination WFLs noted during ADL tasks with pt efficiently tying the string on her pants for FM coordination. 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 posture Motor - Discharge Observations: h/o thoracic scoliosis and kyphosis, still with generalized weakness Mobility  Bed Mobility Bed Mobility: Supine to Sit Rolling Right: Supervision/verbal cueing Rolling Left: Supervision/Verbal cueing Supine to Sit: Supervision/Verbal cueing Sitting - Scoot to Edge of Bed: Supervision/Verbal cueing Sit to Supine: Supervision/Verbal cueing Transfers Sit to Stand: Supervision/Verbal cueing Stand to Sit: Supervision/Verbal cueing  Trunk/Postural Assessment  Cervical Assessment Cervical Assessment: Exceptions to [facility] (forward cervical protraction) Thoracic Assessment Thoracic Assessment: Exceptions to [facility] (thoracic kyphosis at baseline) Lumbar Assessment Lumbar Assessment: Exceptions to [facility] (lumbar flexion in standing) Postural Control Postural Control: Deficits on evaluation  Balance Balance Balance Assessed: Yes Static Sitting Balance Static Sitting - Balance Support: Feet supported Static Sitting - Level of Assistance: 6: Modified independent (Device/Increase time) Dynamic Sitting Balance Dynamic Sitting - Balance Support: During functional activity Dynamic Sitting - Level of Assistance: 7: Independent Static  Standing Balance Static Standing - Balance Support: During functional activity;Bilateral upper extremity supported Static Standing - Level of Assistance: 5: Stand by assistance Dynamic Standing Balance Dynamic Standing - Balance Support: During functional activity;Bilateral upper extremity supported Dynamic Standing - Level of Assistance: 4: Min assist Extremity/Trunk Assessment RUE Assessment RUE Assessment: Within Functional Limits Active Range of Motion (AROM) Comments: WFLS General Strength Comments: 4/5 throughout LUE Assessment LUE Assessment: Within Functional Limits Active Range of Motion (AROM) Comments: WFLs General Strength Comments: strength 4/5 throughout   Armando Bukhari OTR/L 07/03/2021, 4:40 PM

## 2021-07-03 NOTE — Progress Notes (Signed)
Patient ID: Caroline [name], female   DOB: [birth year], 85 y.o.   MRN: [id number]  [facility] approved pt for therapies. SOC Mon/Tues  [place], [place] [phone]

## 2021-07-03 NOTE — Plan of Care (Signed)
  Problem: RH Cognition - SLP Goal: RH LTG Patient will demonstrate orientation with cues Description:  LTG:  Patient will demonstrate orientation to person/place/time/situation with cues (SLP)   Outcome: Completed/Met   Problem: RH Memory Goal: LTG Patient will use memory compensatory aids to (SLP) Description: LTG:  Patient will use memory compensatory aids to recall biographical/new, daily complex information with cues (SLP) Outcome: Completed/Met   Problem: RH Awareness Goal: LTG: Patient will demonstrate awareness during functional activites type of (SLP) Description: LTG: Patient will demonstrate awareness during functional activites type of (SLP) Outcome: Completed/Met

## 2021-07-03 NOTE — Progress Notes (Signed)
PROGRESS NOTE   Subjective/Complaints:  Husband in for SLP session working on looking to RIght to compensate for Right field cut  Discussed with husband need for 24/7 supervision   ROS: Denies CP, SOB, N/V/D   No results found. No results for input(s): WBC, HGB, HCT, PLT in the last 72 hours.  Recent Labs    07/01/21 0524  NA 137  K 3.9  CL 105  CO2 25  GLUCOSE 91  BUN 19  CREATININE 0.71  CALCIUM 9.0     Intake/Output Summary (Last 24 hours) at 07/03/2021 0833 Last data filed at 07/02/2021 1820 Gross per 24 hour  Intake 440 ml  Output --  Net 440 ml         Physical Exam: Vital Signs Blood pressure 134/62, pulse 90, temperature 98 F (36.7 C), temperature source Oral, resp. rate 14, height 5\' 4"  (1.626 m), weight 62.8 kg, SpO2 95 %.   General: No acute distress Mood and affect are appropriate Heart: Regular rate and rhythm no rubs murmurs or extra sounds Lungs: Clear to auscultation, breathing unlabored, no rales or wheezes Abdomen: Positive bowel sounds, soft nontender to palpation, nondistended Extremities: No clubbing, cyanosis, or edema Skin: No evidence of breakdown, no evidence of rash  Motor: Bilateral upper extremities: 5/5 proximal distal Left lower extremity: 5/5 proximal distal Right lower extremity: Hip flexion, knee extension 4+/5, ankle dorsiflexion 5/5   Assessment/Plan: 1. Functional deficits which require 3+ hours per day of interdisciplinary therapy in a comprehensive inpatient rehab setting. Physiatrist is providing close team supervision and 24 hour management of active medical problems listed below. Physiatrist and rehab team continue to assess barriers to discharge/monitor patient progress toward functional and medical goals  Care Tool:  Bathing    Body parts bathed by patient: Right arm, Left arm, Chest, Abdomen, Front perineal area, Buttocks, Right upper leg, Left upper  leg, Face, Left lower leg, Right lower leg   Body parts bathed by helper: Face     Bathing assist Assist Level: Set up assist     Upper Body Dressing/Undressing Upper body dressing   What is the patient wearing?: Pull over shirt    Upper body assist Assist Level: Set up assist    Lower Body Dressing/Undressing Lower body dressing      What is the patient wearing?: Underwear/pull up, Pants     Lower body assist Assist for lower body dressing: Set up assist     Toileting Toileting    Toileting assist Assist for toileting: Contact Guard/Touching assist     Transfers Chair/bed transfer  Transfers assist     Chair/bed transfer assist level: Supervision/Verbal cueing Chair/bed transfer assistive device: Armrests,   Ambulation assist      Assist level: Supervision/Verbal cueing Assistive device: Walker-rolling Max distance: 270ft   Walk 10 feet activity   Assist     Assist level: Supervision/Verbal cueing Assistive device: Walker-rolling   Walk 50 feet activity   Assist    Assist level: Supervision/Verbal cueing Assistive device: Walker-rolling    Walk 150 feet activity   Assist    Assist level: Supervision/Verbal cueing Assistive device: Walker-rolling  Walk 10 feet on uneven surface  activity   Assist Walk 10 feet on uneven surfaces activity did not occur: Safety/medical concerns         Wheelchair     Assist Is the patient using a wheelchair?: No             Wheelchair 50 feet with 2 turns activity    Assist            Wheelchair 150 feet activity     Assist          Blood pressure 134/62, pulse 90, temperature 98 F (36.7 C), temperature source Oral, resp. rate 14, height 5\' 4"  (1.626 m), weight 62.8 kg, SpO2 95 %.    Medical Problem List and Plan: 1.  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 Pain Management: Tylenol as needed 4. Mood history of cognitive impairment:              -antipsychotic agents: Seroquel 25 mg nightly 5. Neuropsych: This patient is not fully capable of making decisions on her own behalf. 6. Skin/Wound Care: Routine skin checks 7. Fluids/Electrolytes/Nutrition: Routine in and outs BMP within acceptable range on 9/22 8.  Hypertension.  Avapro 150 mg daily.   Vitals:   07/02/21 2004 07/03/21 0415  BP: (!) 152/69 134/62  Pulse: 94 90  Resp: 14 14  Temp: 98.4 F (36.9 C) 98 F (36.7 C)  SpO2: 96% 95%   Controlled 9/30 9.  Hyperlipidemia.  Lipitor 80 10.  Gram-negative rod UTI.  Complete course of Rocephin  11.  Hypothyroidism.  Synthroid 12.  Hypoalbuminemia  Supplement initiated on 9/23 13.  Post stroke dysphagia  tolerating regular diet    LOS: 9 days A FACE TO FACE EVALUATION WAS PERFORMED  10/23 07/03/2021, 8:33 AM

## 2021-07-03 NOTE — Progress Notes (Signed)
Speech Language Pathology Discharge Summary  Patient Details  Name: Caroline [name] MRN: [id number] Date of Birth: 1935/11/15  Today's Date: 07/03/2021 SLP Individual Time: 5681-2751 SLP Individual Time Calculation (min): 45 min  Skilled Therapeutic Interventions: Pt received in recliner chair and was accompanied by her spouse. Both agreeable to pt/family education. SLP provided education on cognitive-communication deficits, pt progress, limitations, cognitive-communication strategies for attention, orientation, memory, problem solving, awareness, and safety. Discussed recommendations for continued SLP intervention through home health. Pt acknowledged recommendations for further treatment as she stated "I need it so I can get better." Awareness continues to be limited secondary to cognitive impairments. Provided spouse with handout on cognitive communication strategies. Spouse verbalized understanding through teach back. Education completed. Patient was left in recliner chair with alarm activated and immediate needs within reach at end of session.   Patient has met 3 of 3 long term goals.  Patient to discharge at overall Mod;Max level.  Reasons goals not met: NA   Clinical Impression/Discharge Summary: Patient has made slow yet functional progress and has met 3 of 3 long-term goals this admission due to improved focused and sustained attention, basic problem solving skills, and use of compensatory memory and orientation aids. Progress has been limited by intermittent confusion, and decreased carry over of skill and recall secondary to severity of cognitive deficits also complicated by baseline memory deficits. Patient is currently completing basic cognitive-linguistic tasks with mod-to-max A verbal and visual cues, max A for awareness and functional recall but mod A with skilled implementation of strategies. Patient continues to benefit from direct cues to refer to external orientation and memory aids.  Patient and family education is complete and patient to discharge home with spouse. Patient's care partner is independent to provide the necessary physical and cognitive assistance at discharge. Patient would benefit from continued SLP services to maximize cognitive-communication function and functional independence.    Care Partner:  Caregiver Able to Provide Assistance: Yes  Type of Caregiver Assistance: Physical;Cognitive  Recommendation:  24 hour supervision/assistance;Home Health SLP  Rationale for SLP Follow Up: Maximize cognitive function and independence;Reduce caregiver burden   Equipment: NA   Reasons for discharge: Discharged from hospital;Treatment goals met   Patient/Family Agrees with Progress Made and Goals Achieved: Yes    Triston Lisanti T Tava Peery 07/03/2021, 3:34 PM

## 2021-07-03 NOTE — Progress Notes (Signed)
Physical Therapy Discharge Summary  Patient Details  Name: Caroline [name] MRN: [id number] Date of Birth: 02-27-1936  Today's Date: 07/03/2021 PT Individual Time: 1415-1445 PT Individual Time Calculation (min): 30 min    Patient has met 6 of 7 long term goals due to improved activity tolerance, improved balance, improved postural control, increased strength, increased range of motion, ability to compensate for deficits, improved attention, improved awareness, and improved coordination.  Patient to discharge at an ambulatory level Supervision.   Patient's care partner requires assistance to provide the necessary physical and cognitive assistance at discharge.  Reasons goals not met: Patient requires CGA for simulated car transfer due to pre-morbid posture and R inattention/field cut.   Recommendation:  Patient will benefit from ongoing skilled PT services in home health setting to continue to advance safe functional mobility, address ongoing impairments in R inattention, dynamic balance, gait progressions, functional strength, and minimize fall risk.  Equipment: No equipment provided  Reasons for discharge: treatment goals met and discharge from hospital  Patient/family agrees with progress made and goals achieved: Yes  PT Discharge Precautions/Restrictions Precautions Precautions: Fall Precaution Comments: right visual field deficit Restrictions Weight Bearing Restrictions: No Vital Signs Therapy Vitals Temp: 98.1 F (36.7 C) Temp Source: Oral Pulse Rate: (!) 102 Resp: 20 BP: (!) 111/55 Patient Position (if appropriate): Lying Oxygen Therapy SpO2: 94 % O2 Device: Room Air Pain Pain Assessment Pain Scale: 0-10 Pain Score: 0-No pain Faces Pain Scale: Hurts little more Pain Location: Chest Pain Orientation: Medial Pain Descriptors / Indicators: Pressure Pain Interference Pain Interference Pain Effect on Sleep: 0. Does not apply - I have not had any pain or hurting in the  past 5 days Pain Interference with Therapy Activities: 0. Does not apply - I have not received rehabilitationtherapy in the past 5 days Pain Interference with Day-to-Day Activities: 1. Rarely or not at all Vision/Perception  Vision - History Ability to See in Adequate Light: 1 Impaired Vision - Assessment Ocular Range of Motion: Within Functional Limits Tracking/Visual Pursuits: Decreased smoothness of horizontal tracking;Decreased smoothness of vertical tracking Perception Perception: Impaired Inattention/Neglect: Does not attend to right visual field Praxis Praxis: Impaired Praxis Impairment Details: Initiation;Motor planning  Cognition Overall Cognitive Status: Impaired/Different from baseline Arousal/Alertness: Awake/alert Orientation Level: Oriented to person Focused Attention: Appears intact Sustained Attention: Appears intact Selective Attention: Impaired Memory: Impaired Awareness: Impaired Problem Solving: Impaired Reasoning: Impaired Sequencing: Impaired Safety/Judgment: Impaired Sensation Sensation Light Touch: Appears Intact Hot/Cold: Appears Intact Proprioception: Appears Intact Stereognosis: Appears Intact Coordination Gross Motor Movements are Fluid and Coordinated: Yes Fine Motor Movements are Fluid and Coordinated: Yes 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 posture Motor - Discharge Observations: h/o thoracic scoliosis and kyphosis  Mobility Bed Mobility Bed Mobility: Rolling Right;Rolling Left;Supine to Sit;Sitting - Scoot to [employer] of Bed Rolling Right: Supervision/verbal cueing Rolling Left: Supervision/Verbal cueing Supine to Sit: Supervision/Verbal cueing Sitting - Scoot to Edge of Bed: Supervision/Verbal cueing Sit  to Supine: Supervision/Verbal cueing Transfers Transfers: Sit to Stand;Stand to Sit;Stand Pivot Transfers Sit to Stand: Supervision/Verbal cueing Stand to Sit: Supervision/Verbal cueing Stand Pivot Transfers: Supervision/Verbal cueing Stand Pivot Transfer Details: Verbal cues for safe use of DME/AE;Verbal cues for precautions/safety Transfer (Assistive device): Rolling walker Locomotion  Gait Ambulation: Yes Gait Assistance: Supervision/Verbal cueing Gait Distance (Feet): 250 Feet Assistive  device: Rolling walker Gait Assistance Details: Verbal cues for safe use of DME/AE;Verbal cues for precautions/safety Gait Gait: Yes Gait Pattern: Impaired Gait Pattern: Trunk flexed;Decreased step length - left;Decreased step length - right;Decreased dorsiflexion - left;Decreased dorsiflexion - right;Narrow base of support;Poor foot clearance - left;Poor foot clearance - right Gait velocity: decreased Stairs / Additional Locomotion Stairs: Yes Stairs Assistance: Contact Guard/Touching assist Stair Management Technique: Two rails Number of Stairs: 4 Height of Stairs: 6 Wheelchair Mobility Wheelchair Mobility: No  Trunk/Postural Assessment  Cervical Assessment Cervical Assessment: Within Functional Limits Thoracic Assessment Thoracic Assessment: Exceptions to [facility] Lumbar Assessment Lumbar Assessment: Exceptions to [facility] Postural Control Postural Control: Deficits on evaluation  Balance Balance Balance Assessed: Yes Static Sitting Balance Static Sitting - Balance Support: Feet supported;Bilateral upper extremity supported Static Sitting - Level of Assistance: 6: Modified independent (Device/Increase time) Dynamic Sitting Balance Dynamic Sitting - Balance Support: Feet supported;During functional activity Dynamic Sitting - Level of Assistance: 5: Stand by assistance Static Standing Balance Static Standing - Balance Support: During functional activity;Bilateral upper extremity  supported Static Standing - Level of Assistance: 5: Stand by assistance Dynamic Standing Balance Dynamic Standing - Balance Support: During functional activity;Bilateral upper extremity supported Dynamic Standing - Level of Assistance: 5: Stand by assistance Extremity Assessment      RLE Assessment RLE Assessment: Exceptions to [facility] RLE Strength Right Hip Flexion: 4/5 Right Hip ABduction: 4/5 Right Hip ADduction: 4/5 Right Knee Flexion: 4/5 Right Knee Extension: 4/5 Right Ankle Dorsiflexion: 4/5 Right Ankle Plantar Flexion: 4/5 LLE Assessment LLE Assessment: Exceptions to [facility] LLE Strength Left Hip Flexion: 4/5 Left Hip ABduction: 4/5 Left Hip ADduction: 4/5 Left Knee Flexion: 4/5 Left Knee Extension: 4/5 Left Ankle Dorsiflexion: 4/5 Left Ankle Plantar Flexion: 4/5  Daily session: Patient received sitting up in wc, RN and husband present. She denies pain, but endorses fatigue. PT asking patient if she could recall AM events, but she did not. RN cleared patient for PT participation. Patient completed minimal dc assessment before requesting to use the bathroom. She was able to ambulate into bathroom with RW and supervision and Max verbal cues to attend to R and remember compensatory strategy of scanning to the R. Continent of small BM, supervision for perihygiene and clothing management. Patient ambulating back to her wc with supervision and max verbal cues. Remaining up in chair, seatbelt alarm on, call light within reach, husband at bedside.   Debbora Dus 07/03/2021, 2:51 PM

## 2021-07-03 NOTE — Progress Notes (Addendum)
Inpatient Rehabilitation Care Coordinator Discharge Note   Patient Details  Name: Caroline [name] MRN: [id number] Date of Birth: Aug 29, 1936   Discharge location: Home  Length of Stay: 11 Days  Discharge activity level: Supervision  Home/community participation: spouse  Patient response AO:ZHYQMV Literacy - How often do you need to have someone help you when you read instructions, pamphlets, or other written material from your doctor or pharmacy?: Always  Patient response HQ:IONGEX Isolation - How often do you feel lonely or isolated from those around you?: Never  Services provided included: MD, RD, PT, OT, SLP, RN, CM, TR, Pharmacy, SW  Financial Services:  Field seismologist Utilized: Private Insurance [employer]  Choices offered to/list presented to:    Follow-up services arranged:  Home Health Home Health Agency: [facility]         Patient response to transportation need: Is the patient able to respond to transportation needs?: Yes In the past 12 months, has lack of transportation kept you from medical appointments or from getting medications?: No In the past 12 months, has lack of transportation kept you from meetings, work, or from getting things needed for daily living?: No    Comments (or additional information):  Patient/Family verbalized understanding of follow-up arrangements:  Yes  Individual responsible for coordination of the follow-up plan: [name] [phone]  Confirmed correct DME delivered: Andria Rhein 07/03/2021    Andria Rhein

## 2021-07-04 ENCOUNTER — Inpatient Hospital Stay (HOSPITAL_COMMUNITY): Payer: Medicare Other

## 2021-07-04 DIAGNOSIS — I63112 Cerebral infarction due to embolism of left vertebral artery: Secondary | ICD-10-CM

## 2021-07-04 LAB — URINALYSIS, ROUTINE W REFLEX MICROSCOPIC
Bilirubin Urine: NEGATIVE
Glucose, UA: NEGATIVE mg/dL
Hgb urine dipstick: NEGATIVE
Ketones, ur: NEGATIVE mg/dL
Leukocytes,Ua: NEGATIVE
Nitrite: NEGATIVE
Protein, ur: NEGATIVE mg/dL
Specific Gravity, Urine: 1.02 (ref 1.005–1.030)
pH: 5 (ref 5.0–8.0)

## 2021-07-04 LAB — COMPREHENSIVE METABOLIC PANEL
ALT: 15 U/L (ref 0–44)
AST: 29 U/L (ref 15–41)
Albumin: 3.4 g/dL — ABNORMAL LOW (ref 3.5–5.0)
Alkaline Phosphatase: 88 U/L (ref 38–126)
Anion gap: 10 (ref 5–15)
BUN: 15 mg/dL (ref 8–23)
CO2: 23 mmol/L (ref 22–32)
Calcium: 9.2 mg/dL (ref 8.9–10.3)
Chloride: 104 mmol/L (ref 98–111)
Creatinine, Ser: 0.69 mg/dL (ref 0.44–1.00)
GFR, Estimated: >60 mL/min (ref >60)
Glucose, Bld: 118 mg/dL — ABNORMAL HIGH (ref 70–99)
Potassium: 3.5 mmol/L (ref 3.5–5.1)
Sodium: 137 mmol/L (ref 135–145)
Total Bilirubin: 0.5 mg/dL (ref 0.3–1.2)
Total Protein: 6.4 g/dL — ABNORMAL LOW (ref 6.5–8.1)

## 2021-07-04 LAB — CBC WITH DIFFERENTIAL/PLATELET
Abs Immature Granulocytes: 0.05 10*3/uL (ref 0.00–0.07)
Basophils Absolute: 0.1 10*3/uL (ref 0.0–0.1)
Basophils Relative: 0 %
Eosinophils Absolute: 0.1 10*3/uL (ref 0.0–0.5)
Eosinophils Relative: 1 %
HCT: 36.1 % (ref 36.0–46.0)
Hemoglobin: 11.6 g/dL — ABNORMAL LOW (ref 12.0–15.0)
Immature Granulocytes: 0 %
Lymphocytes Relative: 13 %
Lymphs Abs: 1.5 10*3/uL (ref 0.7–4.0)
MCH: 28.5 pg (ref 26.0–34.0)
MCHC: 32.1 g/dL (ref 30.0–36.0)
MCV: 88.7 fL (ref 80.0–100.0)
Monocytes Absolute: 0.9 10*3/uL (ref 0.1–1.0)
Monocytes Relative: 8 %
Neutro Abs: 8.9 10*3/uL — ABNORMAL HIGH (ref 1.7–7.7)
Neutrophils Relative %: 78 %
Platelets: 324 10*3/uL (ref 150–400)
RBC: 4.07 MIL/uL (ref 3.87–5.11)
RDW: 16.2 % — ABNORMAL HIGH (ref 11.5–15.5)
WBC: 11.5 10*3/uL — ABNORMAL HIGH (ref 4.0–10.5)
nRBC: 0 % (ref 0.0–0.2)

## 2021-07-04 MED ORDER — SODIUM CHLORIDE 0.45 % IV SOLN: INTRAVENOUS | Status: DC

## 2021-07-04 MED ORDER — IRBESARTAN 75 MG PO TABS
75.0000 mg | ORAL_TABLET | Freq: Every day | ORAL | Status: DC
  Administered 2021-07-05 – 2021-07-06 (×2): 75 mg via ORAL
  Filled 2021-07-04 (×3): qty 1

## 2021-07-04 NOTE — Progress Notes (Signed)
PROGRESS NOTE   Subjective/Complaints:  Family thinks pt not the same , hasn't eaten very well , had dizzy spell with PT yesterday  Pt has Left hip pain, no abd pain  ROS: Denies CP, SOB, N/V/D   No results found. No results for input(s): WBC, HGB, HCT, PLT in the last 72 hours.  No results for input(s): NA, K, CL, CO2, GLUCOSE, BUN, CREATININE, CALCIUM in the last 72 hours.   Intake/Output Summary (Last 24 hours) at 07/04/2021 0918 Last data filed at 07/03/2021 1858 Gross per 24 hour  Intake 120 ml  Output --  Net 120 ml         Physical Exam: Vital Signs Blood pressure (!) 103/49, pulse 87, temperature 97.8 F (36.6 C), temperature source Oral, resp. rate 18, height 5\' 4"  (1.626 m), weight 62.8 kg, SpO2 97 %.   General: No acute distress Mood and affect are appropriate Heart: Regular rate and rhythm no rubs murmurs or extra sounds Lungs: Clear to auscultation, breathing unlabored, no rales or wheezes Abdomen: Positive bowel sounds, soft nontender to palpation, nondistended Extremities: No clubbing, cyanosis, or edema Skin: No evidence of breakdown, no evidence of rash MSK- no pain with Hip ROM, no bruising , tenderness over the Left lateral hip  Motor: Bilateral upper extremities: 5/5 proximal distal Left lower extremity: 5/5 proximal distal Right lower extremity: Hip flexion, knee extension 4+/5, ankle dorsiflexion 5/5  RIght  field cut  Assessment/Plan: 1. Functional deficits which require 3+ hours per day of interdisciplinary therapy in a comprehensive inpatient rehab setting. Physiatrist is providing close team supervision and 24 hour management of active medical problems listed below. Physiatrist and rehab team continue to assess barriers to discharge/monitor patient progress toward functional and medical goals  Care Tool:  Bathing    Body parts bathed by patient: Right arm, Left arm, Chest, Abdomen,  Front perineal area, Buttocks, Right upper leg, Left upper leg, Face, Left lower leg, Right lower leg   Body parts bathed by helper: Face     Bathing assist Assist Level: Set up assist     Upper Body Dressing/Undressing Upper body dressing   What is the patient wearing?: Pull over shirt, Bra    Upper body assist Assist Level: Set up assist    Lower Body Dressing/Undressing Lower body dressing      What is the patient wearing?: Underwear/pull up, Pants     Lower body assist Assist for lower body dressing: Minimal Assistance - Patient > 75%     Toileting Toileting    Toileting assist Assist for toileting: Supervision/Verbal cueing     Transfers Chair/bed transfer  Transfers assist     Chair/bed transfer assist level: Supervision/Verbal cueing Chair/bed transfer assistive device: Armrests,   Ambulation assist      Assist level: Supervision/Verbal cueing Assistive device: Walker-rolling Max distance: 282ft   Walk 10 feet activity   Assist     Assist level: Supervision/Verbal cueing Assistive device: Walker-rolling   Walk 50 feet activity   Assist    Assist level: Supervision/Verbal cueing Assistive device: Walker-rolling    Walk 150 feet activity   Assist  Assist level: Supervision/Verbal cueing Assistive device: Walker-rolling    Walk 10 feet on uneven surface  activity   Assist Walk 10 feet on uneven surfaces activity did not occur: Safety/medical concerns         Wheelchair     Assist Is the patient using a wheelchair?: No             Wheelchair 50 feet with 2 turns activity    Assist            Wheelchair 150 feet activity     Assist          Blood pressure (!) 103/49, pulse 87, temperature 97.8 F (36.6 C), temperature source Oral, resp. rate 18, height 5\' 4"  (1.626 m), weight 62.8 kg, SpO2 97 %.    Medical Problem List and Plan: 1.  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 Orthostatic event,  Will check ortho vitals Hip pain is mainly  with lateral hip soft tissue palpation  Will xray Left hip  Recheck CT head  UA C and S  CBC and CMET -DVT/anticoagulation:  Pharmaceutical: Lovenox will d/c since pt amb >200'             -antiplatelet therapy: Aspirin 81 mg daily and Plavix 75 mg day x3 weeks then Plavix alone 3. Pain Management: Tylenol as needed 4. Mood history of cognitive impairment:              -antipsychotic agents: Seroquel 25 mg nightly 5. Neuropsych: This patient is not fully capable of making decisions on her own behalf. 6. Skin/Wound Care: Routine skin checks 7. Fluids/Electrolytes/Nutrition: Routine in and outs BMP within acceptable range on 9/22 8.  Hypertension.  Avapro 150 mg daily.   Vitals:   07/03/21 2014 07/04/21 0342  BP: (!) 108/53 (!) 103/49  Pulse: 100 87  Resp: 18 18  Temp: 98.2 F (36.8 C) 97.8 F (36.6 C)  SpO2: 95% 97%   Controlled 10/1 but on low side had dizziness when up yesterday will check BMET and reduce avapro  9.  Hyperlipidemia.  Lipitor 80 10.  Gram-negative rod UTI.  Complete course of Rocephin  11.  Hypothyroidism.  Synthroid 12.  Hypoalbuminemia  Supplement initiated on 9/23 13.  Post stroke dysphagia  tolerating regular diet    LOS: 10 days A FACE TO FACE EVALUATION WAS PERFORMED  10/23 07/04/2021, 9:18 AM

## 2021-07-04 NOTE — Progress Notes (Signed)
Follow-up from lab work, x-ray of the hip was normal CT head was normal Compressive metabolic panel was unremarkable, albumin remains low but slightly better than prior CBC did show mild elevation of WBCs with left shift  Awaiting UA CNS

## 2021-07-05 ENCOUNTER — Other Ambulatory Visit: Payer: Self-pay

## 2021-07-05 DIAGNOSIS — J189 Pneumonia, unspecified organism: Secondary | ICD-10-CM

## 2021-07-05 LAB — URINE CULTURE: Culture: NO GROWTH

## 2021-07-05 MED ORDER — SODIUM CHLORIDE 0.9 % IV SOLN
1.0000 g | INTRAVENOUS | Status: AC
  Administered 2021-07-05: 1 g via INTRAVENOUS
  Filled 2021-07-05: qty 10

## 2021-07-05 MED ORDER — CEFDINIR 300 MG PO CAPS
300.0000 mg | ORAL_CAPSULE | Freq: Two times a day (BID) | ORAL | Status: DC
  Administered 2021-07-06 – 2021-07-07 (×3): 300 mg via ORAL
  Filled 2021-07-05 (×3): qty 1

## 2021-07-05 NOTE — Progress Notes (Signed)
PROGRESS NOTE   Subjective/Complaints:  Pt denies SOB, however having pain with inspiration around L lower rib cage.   Per husband, "still not herself".   No UTI on U/A, however has LLL infiltrate based on CXR.   D/w pt husband and pharmacy- we will give 1 dose Rocephin and switch to Cefdinir.    ROS:  Pt denies SOB, abd pain, CP, N/V/C/D, and vision changes    DG Chest 2 View  Result Date: 07/04/2021 CLINICAL DATA:  Leukocytosis. EXAM: CHEST - 2 VIEW COMPARISON:  02/17/2010 FINDINGS: Patient is slightly rotated towards the RIGHT. There is patchy infiltrate best seen on the LATERAL view in the posterior aspect of the chest, likely representing LEFT LOWER lobe infiltrate. No pulmonary edema. There is atherosclerotic calcification of the thoracic aorta. IMPRESSION: Bibasilar atelectasis.  Suspect LEFT LOWER lobe infiltrate. Electronically Signed   By: Norva Pavlov M.D.   On: 07/04/2021 18:05   CT HEAD WO CONTRAST ( )  Result Date: 07/04/2021 CLINICAL DATA:  Stroke follow-up EXAM: CT HEAD WITHOUT CONTRAST TECHNIQUE: Contiguous axial images were obtained from the base of the skull through the vertex without intravenous contrast. COMPARISON:  06/22/2021 FINDINGS: Brain: Large acute left PCA territory infarct affecting the occipital lobe. Left posterior thalamic infarct which is also recent by MRI. Advanced chronic small vessel ischemia in the hemispheric white matter with chronic lacunar infarct at the anterior right thalamus. No hydrocephalus or collection. Vascular: No hyperdense vessel or unexpected calcification. Skull: Normal. Negative for fracture or focal lesion. Sinuses/Orbits: No acute finding. IMPRESSION: Subacute left PCA territory infarct without progression or hemorrhagic complication. Electronically Signed   By: Tiburcio Pea M.D.   On: 07/04/2021 10:38   DG HIP UNILAT WITH PELVIS 2-3 VIEWS LEFT  Result Date:  07/04/2021 CLINICAL DATA:  Acute left hip pain. EXAM: DG HIP (WITH OR WITHOUT PELVIS) 2-3V LEFT COMPARISON:  None. FINDINGS: There is no evidence of hip fracture or dislocation. There is no evidence of arthropathy or other focal bone abnormality. IMPRESSION: Negative. Electronically Signed   By: Gerome Sam III M.D.   On: 07/04/2021 12:20   Recent Labs    07/04/21 0955  WBC 11.5*  HGB 11.6*  HCT 36.1  PLT 324   Recent Labs    07/04/21 0955  NA 137  K 3.5  CL 104  CO2 23  GLUCOSE 118*  BUN 15  CREATININE 0.69  CALCIUM 9.2    Intake/Output Summary (Last 24 hours) at 07/05/2021 0940 Last data filed at 07/05/2021 0700 Gross per 24 hour  Intake 1427.68 ml  Output 300 ml  Net 1127.68 ml        Physical Exam: Vital Signs Blood pressure (!) 104/45, pulse 93, temperature 98.2 F (36.8 C), temperature source Oral, resp. rate 18, height 5\' 4"  (1.626 m), weight 62.8 kg, SpO2 92 %.    General: awake, alert, appropriate,  listless; husband at bedside; supine in bed; NAD HENT: conjugate gaze; oropharynx moist CV: regular rate; no JVD Pulmonary: decreased over LLL- no rales, rhonchi, wheezes- TTP over L lower ribcage as well GI: soft, NT, ND, (+)BS Psychiatric: appropriate but slightly listless Neurological: alert  Motor: Bilateral upper  extremities: 5/5 proximal distal Left lower extremity: 5/5 proximal distal Right lower extremity: Hip flexion, knee extension 4+/5, ankle dorsiflexion 5/5  RIght  field cut  Assessment/Plan: 1. Functional deficits which require 3+ hours per day of interdisciplinary therapy in a comprehensive inpatient rehab setting. Physiatrist is providing close team supervision and 24 hour management of active medical problems listed below. Physiatrist and rehab team continue to assess barriers to discharge/monitor patient progress toward functional and medical goals  Care Tool:  Bathing    Body parts bathed by patient: Right arm, Left arm, Chest,  Abdomen, Front perineal area, Buttocks, Right upper leg, Left upper leg, Face, Left lower leg, Right lower leg   Body parts bathed by helper: Face     Bathing assist Assist Level: Set up assist     Upper Body Dressing/Undressing Upper body dressing   What is the patient wearing?: Pull over shirt, Bra    Upper body assist Assist Level: Set up assist    Lower Body Dressing/Undressing Lower body dressing      What is the patient wearing?: Underwear/pull up, Pants     Lower body assist Assist for lower body dressing: Minimal Assistance - Patient > 75%     Toileting Toileting    Toileting assist Assist for toileting: Supervision/Verbal cueing     Transfers Chair/bed transfer  Transfers assist     Chair/bed transfer assist level: Supervision/Verbal cueing Chair/bed transfer assistive device: Armrests, Geologist, engineering   Ambulation assist      Assist level: Supervision/Verbal cueing Assistive device: Walker-rolling Max distance: 229ft   Walk 10 feet activity   Assist     Assist level: Supervision/Verbal cueing Assistive device: Walker-rolling   Walk 50 feet activity   Assist    Assist level: Supervision/Verbal cueing Assistive device: Walker-rolling    Walk 150 feet activity   Assist    Assist level: Supervision/Verbal cueing Assistive device: Walker-rolling    Walk 10 feet on uneven surface  activity   Assist Walk 10 feet on uneven surfaces activity did not occur: Safety/medical concerns         Wheelchair     Assist Is the patient using a wheelchair?: No             Wheelchair 50 feet with 2 turns activity    Assist            Wheelchair 150 feet activity     Assist          Blood pressure (!) 104/45, pulse 93, temperature 98.2 F (36.8 C), temperature source Oral, resp. rate 18, height 5\' 4"  (1.626 m), weight 62.8 kg, SpO2 92 %.    Medical Problem List and Plan: 1.  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 Orthostatic event,  Will check ortho vitals Hip pain is mainly  with lateral hip soft tissue palpation  Will xray Left hip  Recheck CT head  UA C and S  CBC and CMET  10/2- Has LLL infiltrate/pneumonia- d/w husband- will give 1 dose Rocephin 1G and  then switch to Cefdinir per recs from pharmacy. If doing better tomorrow based on clinical and labs- (CBC-diff and BMP ordered for AM), then can be discharged.  -DVT/anticoagulation:  Pharmaceutical: Lovenox will d/c since pt amb >200'             -antiplatelet therapy: Aspirin 81 mg daily and Plavix 75 mg day x3 weeks then Plavix alone 3. Pain Management: Tylenol as needed  10/2- pain in L rib cage- con't tylenol prn 4. Mood history of cognitive impairment:              -antipsychotic agents: Seroquel 25 mg nightly 5. Neuropsych: This patient is not fully capable of making decisions on her own behalf. 6. Skin/Wound Care: Routine skin checks 7. Fluids/Electrolytes/Nutrition: Routine in and outs BMP within acceptable range on 9/22 8.  Hypertension.  Avapro 150 mg daily.   Vitals:   07/04/21 2022 07/05/21 0335  BP: (!) 105/46 (!) 104/45  Pulse: 98 93  Resp: 18 18  Temp: 98.2 F (36.8 C) 98.2 F (36.8 C)  SpO2: 95% 92%   Controlled 10/1 but on low side had dizziness when up yesterday will check BMET and reduce avapro   10/2- BP soft, but asymptomatic- con't regimen 9.  Hyperlipidemia.  Lipitor 80 10.  Gram-negative rod UTI.   Complete course of Rocephin  11.  Hypothyroidism.  Synthroid 12.  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 LLL infiltrate  10/2- Rocephin x1 and then PO Cefdinir- d/c per team.      LOS: 11 days A FACE TO FACE EVALUATION WAS PERFORMED  Jahnia Hewes 07/05/2021, 9:40 AM

## 2021-07-06 ENCOUNTER — Encounter (HOSPITAL_COMMUNITY): Payer: Self-pay | Admitting: Physical Medicine & Rehabilitation

## 2021-07-06 DIAGNOSIS — F05 Delirium due to known physiological condition: Secondary | ICD-10-CM

## 2021-07-06 DIAGNOSIS — Z8719 Personal history of other diseases of the digestive system: Secondary | ICD-10-CM

## 2021-07-06 DIAGNOSIS — Z9889 Other specified postprocedural states: Secondary | ICD-10-CM

## 2021-07-06 DIAGNOSIS — I639 Cerebral infarction, unspecified: Secondary | ICD-10-CM

## 2021-07-06 LAB — CBC WITH DIFFERENTIAL/PLATELET
Abs Immature Granulocytes: 0.04 10*3/uL (ref 0.00–0.07)
Basophils Absolute: 0 10*3/uL (ref 0.0–0.1)
Basophils Relative: 0 %
Eosinophils Absolute: 0.1 10*3/uL (ref 0.0–0.5)
Eosinophils Relative: 1 %
HCT: 29.5 % — ABNORMAL LOW (ref 36.0–46.0)
Hemoglobin: 9.9 g/dL — ABNORMAL LOW (ref 12.0–15.0)
Immature Granulocytes: 0 %
Lymphocytes Relative: 7 %
Lymphs Abs: 0.8 10*3/uL (ref 0.7–4.0)
MCH: 28.8 pg (ref 26.0–34.0)
MCHC: 33.6 g/dL (ref 30.0–36.0)
MCV: 85.8 fL (ref 80.0–100.0)
Monocytes Absolute: 0.9 10*3/uL (ref 0.1–1.0)
Monocytes Relative: 8 %
Neutro Abs: 10.1 10*3/uL — ABNORMAL HIGH (ref 1.7–7.7)
Neutrophils Relative %: 84 %
Platelets: 258 10*3/uL (ref 150–400)
RBC: 3.44 MIL/uL — ABNORMAL LOW (ref 3.87–5.11)
RDW: 16.1 % — ABNORMAL HIGH (ref 11.5–15.5)
WBC: 12 10*3/uL — ABNORMAL HIGH (ref 4.0–10.5)
nRBC: 0 % (ref 0.0–0.2)

## 2021-07-06 LAB — BASIC METABOLIC PANEL
Anion gap: 10 (ref 5–15)
BUN: 14 mg/dL (ref 8–23)
CO2: 23 mmol/L (ref 22–32)
Calcium: 8.3 mg/dL — ABNORMAL LOW (ref 8.9–10.3)
Chloride: 100 mmol/L (ref 98–111)
Creatinine, Ser: 0.58 mg/dL (ref 0.44–1.00)
GFR, Estimated: >60 mL/min (ref >60)
Glucose, Bld: 112 mg/dL — ABNORMAL HIGH (ref 70–99)
Potassium: 2.7 mmol/L — CL (ref 3.5–5.1)
Sodium: 133 mmol/L — ABNORMAL LOW (ref 135–145)

## 2021-07-06 MED ORDER — IRBESARTAN 75 MG PO TABS: 75.0000 mg | ORAL_TABLET | Freq: Every day | ORAL | 0 refills | Status: DC

## 2021-07-06 MED ORDER — POTASSIUM CHLORIDE CRYS ER 20 MEQ PO TBCR
40.0000 meq | EXTENDED_RELEASE_TABLET | Freq: Two times a day (BID) | ORAL | Status: AC
  Administered 2021-07-06: 40 meq via ORAL
  Filled 2021-07-06: qty 2

## 2021-07-06 MED ORDER — CEFDINIR 300 MG PO CAPS: 300.0000 mg | ORAL_CAPSULE | Freq: Two times a day (BID) | ORAL | 0 refills | Status: DC

## 2021-07-06 MED ORDER — POTASSIUM CHLORIDE CRYS ER 20 MEQ PO TBCR
40.0000 meq | EXTENDED_RELEASE_TABLET | Freq: Two times a day (BID) | ORAL | Status: AC
  Administered 2021-07-06 – 2021-07-07 (×2): 40 meq via ORAL
  Filled 2021-07-06 (×2): qty 2

## 2021-07-06 NOTE — Progress Notes (Signed)
Patient ID: Caroline [name], female   DOB: [birth year], 85 y.o.   MRN: [id number] Follow up with patient and daughter re pending discharge. Daughter concerned about decline in functional status without therapy for almost a week now. Patient on medical delay; discharged from rehab therapy as of Friday. Low grade temp continues; enc use of IS and given information on hypokalemia and food sources. Continue to follow along to discharge; both Med and Rehab MD anticipate 07/07/21. Pamelia Hoit

## 2021-07-06 NOTE — Progress Notes (Signed)
PROGRESS NOTE   Subjective/Complaints: K+ low to 2.7 today- discussed with Jesusita Oka, will supplement, will postpone d/c to tomorrow if medically stable tomorrow.  Diastolic BP soft, afebrile Appreciate Dr. Ophelia Charter following   ROS:  Pt denies SOB, abd pain, CP, N/V/C/D, and vision changes, +back pain    DG Chest 2 View  Result Date: 07/04/2021 CLINICAL DATA:  Leukocytosis. EXAM: CHEST - 2 VIEW COMPARISON:  02/17/2010 FINDINGS: Patient is slightly rotated towards the RIGHT. There is patchy infiltrate best seen on the LATERAL view in the posterior aspect of the chest, likely representing LEFT LOWER lobe infiltrate. No pulmonary edema. There is atherosclerotic calcification of the thoracic aorta. IMPRESSION: Bibasilar atelectasis.  Suspect LEFT LOWER lobe infiltrate. Electronically Signed   By: Norva Pavlov M.D.   On: 07/04/2021 18:05   CT HEAD WO CONTRAST ( )  Result Date: 07/04/2021 CLINICAL DATA:  Stroke follow-up EXAM: CT HEAD WITHOUT CONTRAST TECHNIQUE: Contiguous axial images were obtained from the base of the skull through the vertex without intravenous contrast. COMPARISON:  06/22/2021 FINDINGS: Brain: Large acute left PCA territory infarct affecting the occipital lobe. Left posterior thalamic infarct which is also recent by MRI. Advanced chronic small vessel ischemia in the hemispheric white matter with chronic lacunar infarct at the anterior right thalamus. No hydrocephalus or collection. Vascular: No hyperdense vessel or unexpected calcification. Skull: Normal. Negative for fracture or focal lesion. Sinuses/Orbits: No acute finding. IMPRESSION: Subacute left PCA territory infarct without progression or hemorrhagic complication. Electronically Signed   By: Tiburcio Pea M.D.   On: 07/04/2021 10:38   DG HIP UNILAT WITH PELVIS 2-3 VIEWS LEFT  Result Date: 07/04/2021 CLINICAL DATA:  Acute left hip pain. EXAM: DG HIP (WITH OR WITHOUT  PELVIS) 2-3V LEFT COMPARISON:  None. FINDINGS: There is no evidence of hip fracture or dislocation. There is no evidence of arthropathy or other focal bone abnormality. IMPRESSION: Negative. Electronically Signed   By: Gerome Sam III M.D.   On: 07/04/2021 12:20   Recent Labs    07/04/21 0955 07/06/21 0502  WBC 11.5* 12.0*  HGB 11.6* 9.9*  HCT 36.1 29.5*  PLT 324 258   Recent Labs    07/04/21 0955 07/06/21 0502  NA 137 133*  K 3.5 2.7*  CL 104 100  CO2 23 23  GLUCOSE 118* 112*  BUN 15 14  CREATININE 0.69 0.58  CALCIUM 9.2 8.3*    Intake/Output Summary (Last 24 hours) at 07/06/2021 0856 Last data filed at 07/06/2021 0843 Gross per 24 hour  Intake 873.47 ml  Output --  Net 873.47 ml        Physical Exam: Vital Signs Blood pressure (!) 110/49, pulse 99, temperature 98.6 F (37 C), temperature source Oral, resp. rate 18, height 5\' 4"  (1.626 m), weight 62.8 kg, SpO2 92 %. Gen: no distress, normal appearing HEENT: oral mucosa pink and moist, NCAT Cardio: Reg rate Chest: normal effort, normal rate of breathing Abd: soft, non-distended Ext: no edema Psych: pleasant, normal affect Skin: intact  Motor: Bilateral upper extremities: 5/5 proximal distal Left lower extremity: 5/5 proximal distal Right lower extremity: Hip flexion, knee extension 4+/5, ankle dorsiflexion 5/5  RIght  field cut  Assessment/Plan: 1. Functional deficits which require 3+ hours per day of interdisciplinary therapy in a comprehensive inpatient rehab setting. Physiatrist is providing close team supervision and 24 hour management of active medical problems listed below. Physiatrist and rehab team continue to assess barriers to discharge/monitor patient progress toward functional and medical goals  Care Tool:  Bathing    Body parts bathed by patient: Right arm, Left arm, Chest, Abdomen, Front perineal area, Buttocks, Right upper leg, Left upper leg, Face, Left lower leg, Right lower leg   Body  parts bathed by helper: Face     Bathing assist Assist Level: Set up assist     Upper Body Dressing/Undressing Upper body dressing   What is the patient wearing?: Pull over shirt, Bra    Upper body assist Assist Level: Set up assist    Lower Body Dressing/Undressing Lower body dressing      What is the patient wearing?: Underwear/pull up, Pants     Lower body assist Assist for lower body dressing: Minimal Assistance - Patient > 75%     Toileting Toileting    Toileting assist Assist for toileting: Supervision/Verbal cueing     Transfers Chair/bed transfer  Transfers assist     Chair/bed transfer assist level: Supervision/Verbal cueing Chair/bed transfer assistive device: Armrests, Geologist, engineering   Ambulation assist      Assist level: Supervision/Verbal cueing Assistive device: Walker-rolling Max distance: 269ft   Walk 10 feet activity   Assist     Assist level: Supervision/Verbal cueing Assistive device: Walker-rolling   Walk 50 feet activity   Assist    Assist level: Supervision/Verbal cueing Assistive device: Walker-rolling    Walk 150 feet activity   Assist    Assist level: Supervision/Verbal cueing Assistive device: Walker-rolling    Walk 10 feet on uneven surface  activity   Assist Walk 10 feet on uneven surfaces activity did not occur: Safety/medical concerns         Wheelchair     Assist Is the patient using a wheelchair?: No             Wheelchair 50 feet with 2 turns activity    Assist            Wheelchair 150 feet activity     Assist          Blood pressure (!) 110/49, pulse 99, temperature 98.6 F (37 C), temperature source Oral, resp. rate 18, height 5\' 4"  (1.626 m), weight 62.8 kg, SpO2 92 %.    Medical Problem List and Plan: 1.  Decreased functional mobility with gait disturbance secondary and dense right homonymous hemianopsia due  to large acute left posterior  cerebral artery infarction after robotic paraesophageal hernia repa Cognitive deficts with reduce orientation and awareness , gastropexy, upper endoscopy 06/15/2021 as well as history of right thalamic CVA 2019  Extend d/c until tomorrow given lab abnormalities.  Surgery felt recovery from esophageal surgery was good SHe had a near fall with PT yesterday , ? Orthostatic event,  Following workup completed: Will check ortho vitals Hip pain is mainly  with lateral hip soft tissue palpation  Will xray Left hip  Recheck CT head  UA C and S  CBC and CMET  10/2- Has LLL infiltrate/pneumonia- d/w husband- will give 1 dose Rocephin 1G and then switch to Cefdinir per recs from pharmacy. If doing better tomorrow based on clinical and labs- (CBC-diff and BMP ordered for AM), then can be discharged.  -DVT/anticoagulation:  Pharmaceutical: Lovenox will d/c since pt amb >200'             -antiplatelet therapy: Aspirin 81 mg daily and Plavix 75 mg day x3 weeks then Plavix alone 3. Pain Management: Tylenol as needed  10/2- pain in L rib cage- con't tylenol prn 4. Mood history of cognitive impairment:              -antipsychotic agents: Seroquel 25 mg nightly 5. Neuropsych: This patient is not fully capable of making decisions on her own behalf. 6. Skin/Wound Care: Routine skin checks 7. Fluids/Electrolytes/Nutrition: Routine in and outs BMP within acceptable range on 9/22 8.  Hypertension.  Avapro 150 mg daily.   Vitals:   07/06/21 0402 07/06/21 0722  BP: (!) 110/50 (!) 110/49  Pulse: 98 99  Resp: 18 18  Temp: 98.6 F (37 C) 98.6 F (37 C)  SpO2: (!) 87% 92%   10/3 currently hypotensive with DBP 49- d/c avapro.  9.  Hyperlipidemia.  Lipitor 80 10.  Gram-negative rod UTI.  Complete course of Rocephin  11.  Hypothyroidism.  Synthroid 12.  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 LLL infiltrate: continue cefdinir 200mg  q12H. WBC  slightly elevated. Medicine consulted, appreciate Dr. following. Repeat CBC tomorrow morning.  15. Anemia: Hgb 11.6 to 9.9. Repeat tomorrow.      LOS: 12 days A FACE TO FACE EVALUATION WAS PERFORMED  Ophelia Charter Trayshawn Durkin 07/06/2021, 8:56 AM

## 2021-07-06 NOTE — Final Progress Note (Signed)
Received  critical lab value for potasium level of 2.7. Pt. Is stable and asymptomatic. Notified Dan A. (Pa) verbally. Will be addressed this morning per Jesusita Oka. A. (PA)

## 2021-07-06 NOTE — Consult Note (Signed)
Medical Consultation   Caroline [name]  CBJ:[id number]  DOB: 02-13-1936  DOA: 06/24/2021  PCP: Gaspar Garbe, MD   Outpatient Specialists: Fredricka Bonine - surgery; Danis - GI; [name] - neurology    Requesting physician: Adelene Idler  Reason for consultation: Large left PCA stroke but has new PNA, K+ 2.7, Hgb 11.6 -> 9.9.  Has had chest pain for a few days, diastolic BP is low.  Overall would appreciate medication management prior to d/c tomorrow.   History of Present Illness: Caroline [name] is an 85 y.o. female with h/o CVA; HTN; HLD; dementia; and hypothyroidism who is currently in CIR after prolonged admission from 9/12-21 for repair of paraesophageal hernia and post-operative PCA ischemia stroke.   Initially upon my entry into the room, her family was taken aback.  They had been told she was leaving today and questioned that she was staying longer.  Eventually, after confirmation that she is not yet being discharged, we had a nice conversation.  The patient is less vibrant today, although she was more conversational and less confused yesterday.  She is having L mid-upper back pain that they think is related to her PNA; this is improved today from yesterday.  Mild periodic cough, yesterday with associated chest pain but this has resolved.  No fevers.  She has not had therapy this weekend and yesterday was minimally able to ambulate even to the bathroom, one time having to use a wheelchair.  They also had been told she has a UTI and are uncertain if this is ongoing or being treated.    Review of Systems:  ROS As per HPI otherwise review of systems negative.    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 Cheek   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   Bridge Left Nose, and Left Neck   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 Cheek Tx: curret x 3 and 5FU   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   Bridge of nose tx Cx3 and 5FU, Left neck Tx Cx3 and 5FU   Thrombophlebitis     Past Surgical History: Past Surgical History:  Procedure Laterality Date   ABDOMINAL HYSTERECTOMY  1979   CATARACT EXTRACTION, BILATERAL     knee surgery Left    VARICOSE VEIN SURGERY Left    XI ROBOTIC ASSISTED PARAESOPHAGEAL HERNIA REPAIR N/A 06/15/2021   Procedure: XI ROBOTIC ASSISTED PARAESOPHAGEAL HERNIA REPAIR, GASTROPEXY, UPPER ENDOSCOPY;  Surgeon: Berna Bue, MD;  Location: [facility];  Service: General;  Laterality: N/A;     Allergies:   Allergies  Allergen Reactions   Gluten Meal Nausea And Vomiting    Patient has CELIAC DISEASE   Wheat Bran Nausea And Vomiting    Patient  has CELIAC DISEASE   Adhesive [Tape] Other (See Comments)    Patient's skin is VERY THIN and tears very easily; please use either paper tape or Coban wrap   Codeine Nausea And Vomiting   Other Other (See Comments)    Patient has Hypoglycemia; her husband stated "when her sugar crashes, it crashes hard."   Penicillins Other (See Comments)    Doesn't remember reaction , but denies any SOB/Swelling States " it was a long time ago "      Social History:  reports that she has never smoked. She has never used smokeless tobacco. She reports that she does not currently use drugs. She reports that she does not drink alcohol.   Family History: Family History  Problem Relation Age of Onset   Arthritis Mother        pt denies    Hypertension Mother        pt denies   Obesity Sister    Cancer Maternal Uncle        type unknown   Colon cancer Neg Hx    Stomach cancer Neg Hx    Esophageal cancer Neg Hx    Pancreatic cancer Neg Hx       Physical Exam: Vitals:   07/05/21 1303 07/05/21 1916 07/06/21 0402 07/06/21 0722  BP: (!) 117/50 (!) 119/47 (!) 110/50 (!) 110/49  Pulse: 90 (!) 101 98 99  Resp: 16 18 18 18   Temp: 98.2 F (36.8 C) (!) 97.5 F (36.4 C) 98.6 F (37 C) 98.6 F (37 C)  TempSrc: Oral Oral Oral Oral  SpO2: 95% 92% (!) 87% 92%  Weight:      Height:        Constitutional: Alert and awake, oriented x3, not in any acute distress. Eyes:  irises appear normal, anicteric sclera, obvious R visual field defect ENMT: external ears and nose appear normal, normal hearing, Lips appear normal, oropharynx mucosa, tongue appear normal, appropriate dentition  Neck: neck appears normal, no masses, normal ROM CVS: S1-S2 clear, no murmur rubs or gallops, no LE edema, normal pedal pulses  Respiratory:  Bibasilar rales. Respiratory effort normal. Minimal accessory muscle use.  Abdomen: soft nontender, nondistended Musculoskeletal: : no cyanosis, clubbing or edema noted bilaterally Neuro: Cranial nerves II-XII intact, +R-sided neglect Psych: judgement and insight appear mildly impaired, blunted mood and affect, mental status Skin: no rashes or lesions or ulcers, no induration or nodules    Data reviewed:  I have personally reviewed the recent labs and imaging studies  Pertinent Labs:   K+ 2.7 WBC 12.0 Hgb 9.9; 11.6 on 10/1 Normal UA on 10/1 9/19 Urine culture with E coli pansensitive except for Augmentin   Inpatient Medications:   Scheduled Meds:  (feeding supplement) PROSource Plus  30 mL Oral BID BM   aspirin EC  81 mg Oral Daily   atorvastatin  80 mg Oral QHS   cefdinir  300 mg Oral Q12H   clopidogrel  75 mg Oral Daily   docusate sodium  200 mg Oral BID   feeding supplement  1 Container Oral  TID BM   hydrocortisone cream   Topical BID   irbesartan  75 mg Oral Daily   levothyroxine  125 mcg Oral Q0600   nystatin   Topical BID   pantoprazole  40 mg Oral BID   potassium chloride  40 mEq Oral BID   QUEtiapine  25 mg Oral QHS   vitamin B-12  250 mcg Oral Daily  Continuous Infusions:  sodium chloride 50 mL/hr at 07/06/21 0411     Radiological Exams on Admission: DG Chest 2 View  Result Date: 07/04/2021 CLINICAL DATA:  Leukocytosis. EXAM: CHEST - 2 VIEW COMPARISON:  02/17/2010 FINDINGS: Patient is slightly rotated towards the RIGHT. There is patchy infiltrate best seen on the LATERAL view in the posterior aspect of the chest, likely representing LEFT LOWER lobe infiltrate. No pulmonary edema. There is atherosclerotic calcification of the thoracic aorta. IMPRESSION: Bibasilar atelectasis.  Suspect LEFT LOWER lobe infiltrate. Electronically Signed   By: Norva Pavlov M.D.   On: 07/04/2021 18:05    Impression/Recommendations Principal Problem:   CVA (cerebral vascular accident) [facility]) Active Problems:   Hypothyroidism   Essential hypertension   S/P repair of paraesophageal hernia   Delirium due to multiple etiologies   Dyslipidemia   Dysphagia, post-stroke   CVA -Post-operative CVA -Patient is completing CIR and for d/c to home soon -Ongoing issues with mobility noted, likely to benefit from therapy services ongoing until d/c -R-sided neglect as well as R peripheral vision impairment, needs ongoing support here -Speech is also following for dysphagia -Continue ASA, Plavix  LLL PNA -Bibasilar atelectasis with possible LLL infiltrate on 10/1 CXR -Appears stable from this standpoint -Agree with continuing Cefdinir to complete course -No longer appears to have UTI (although this would be appropriate antibiotic coverage for this as well) -She does have bibasilar rales which is likely due to atelectasis -Family already took her IS home so will  reorder  Hypokalemia -Downtrending K+ while here -Husband reports that she takes K+ supplement at home -She was given 40 mEq x 1 -Will give 2 additional doses (tonight and tomorrow AM) -Recheck BMP in AM -This may be contributing to her back pain  Anemia -Uncertain etiology -Not concerning enough for further evaluation/treatment currently but will recheck in AM  S/p paraesophageal hernia repair -Signed off by surgery -Outpatient f/u  HLD -Continue high-dose Lipitor  HTN -Continue irbesartan  Hypothyroidism -Continue Synthroid  Dementia with behavioral effects -Will need close outpatient care -Likely should not be driving; suggest full neuropsychiatric testing if family is considering allowing her to resume driving -Continue Seroquel     Thank you for this consultation.  Our [facility] hospitalist team will follow the patient with you.   Time Spent: 50 minutes  Jonah Blue M.D. [facility] 07/06/2021, 11:06 AM

## 2021-07-07 LAB — CBC WITH DIFFERENTIAL/PLATELET
Abs Immature Granulocytes: 0.03 10*3/uL (ref 0.00–0.07)
Basophils Absolute: 0.1 10*3/uL (ref 0.0–0.1)
Basophils Relative: 1 %
Eosinophils Absolute: 0.3 10*3/uL (ref 0.0–0.5)
Eosinophils Relative: 3 %
HCT: 30.7 % — ABNORMAL LOW (ref 36.0–46.0)
Hemoglobin: 9.9 g/dL — ABNORMAL LOW (ref 12.0–15.0)
Immature Granulocytes: 0 %
Lymphocytes Relative: 13 %
Lymphs Abs: 1.3 10*3/uL (ref 0.7–4.0)
MCH: 28.1 pg (ref 26.0–34.0)
MCHC: 32.2 g/dL (ref 30.0–36.0)
MCV: 87.2 fL (ref 80.0–100.0)
Monocytes Absolute: 1 10*3/uL (ref 0.1–1.0)
Monocytes Relative: 10 %
Neutro Abs: 7.7 10*3/uL (ref 1.7–7.7)
Neutrophils Relative %: 73 %
Platelets: 254 10*3/uL (ref 150–400)
RBC: 3.52 MIL/uL — ABNORMAL LOW (ref 3.87–5.11)
RDW: 15.9 % — ABNORMAL HIGH (ref 11.5–15.5)
WBC: 10.4 10*3/uL (ref 4.0–10.5)
nRBC: 0 % (ref 0.0–0.2)

## 2021-07-07 LAB — MAGNESIUM: Magnesium: 1.9 mg/dL (ref 1.7–2.4)

## 2021-07-07 LAB — BASIC METABOLIC PANEL
Anion gap: 8 (ref 5–15)
BUN: 11 mg/dL (ref 8–23)
CO2: 25 mmol/L (ref 22–32)
Calcium: 8.6 mg/dL — ABNORMAL LOW (ref 8.9–10.3)
Chloride: 102 mmol/L (ref 98–111)
Creatinine, Ser: 0.68 mg/dL (ref 0.44–1.00)
GFR, Estimated: >60 mL/min (ref >60)
Glucose, Bld: 95 mg/dL (ref 70–99)
Potassium: 3.7 mmol/L (ref 3.5–5.1)
Sodium: 135 mmol/L (ref 135–145)

## 2021-07-07 MED ORDER — IRBESARTAN 75 MG PO TABS: 75.0000 mg | ORAL_TABLET | Freq: Every day | ORAL | 0 refills | Status: DC

## 2021-07-07 MED ORDER — IRBESARTAN 75 MG PO TABS
75.0000 mg | ORAL_TABLET | Freq: Every day | ORAL | Status: DC
  Administered 2021-07-07: 75 mg via ORAL
  Filled 2021-07-07: qty 1

## 2021-07-07 NOTE — Progress Notes (Signed)
[facility] Medicine Consultation Follow up:  Subjective:  Lying comfortably in bed-appears comfortable-not on oxygen.  Some mild cough.  Objective: Blood pressure (!) 118/56, pulse 92, temperature 98.9 F (37.2 C), temperature source Oral, resp. rate 16, height 5\' 4"  (1.626 m), weight 62.8 kg, SpO2 95 %.   Lungs: No rales/rhonchi CVS: S1-S2 regular-no murmurs Abdomen: Soft nontender nondistended Extremities: No edema Neurology: Appears nonfocal.  CBC Latest Ref Rng & Units 07/07/2021 07/06/2021 07/04/2021  WBC 4.0 - 10.5 K/uL 10.4 12.0(H) 11.5(H)  Hemoglobin 12.0 - 15.0 g/dL 09/03/2021) 3.4(J) 11.6(L)  Hematocrit 36.0 - 46.0 % 30.7(L) 29.5(L) 36.1  Platelets 150 - 400 K/uL 254 258 324    BMET    Component Value Date/Time   NA 135 07/07/2021 0411   K 3.7 07/07/2021 0411   CL 102 07/07/2021 0411   CO2 25 07/07/2021 0411   GLUCOSE 95 07/07/2021 0411   BUN 11 07/07/2021 0411   CREATININE 0.68 07/07/2021 0411   CALCIUM 8.6 (L) 07/07/2021 0411   GFRNONAA >60 07/07/2021 0411    Assessment/plan: LLL PNA: Afebrile nontoxic-appearing-not hypoxic-agree with continuing cefdinir-suspect needs another 2 more days of therapy to complete a total of 5 days (last day on 10/6).  Acute CVA: Evaluated by neurology earlier during her hospital stay-recommendations were for aspirin/Plavix x21 days followed by Plavix alone ( Suspect can stop aspirin on 10/7).  Continue Lipitor 80 mg p.o. daily.  Neurology recommends outpatient event monitor.  Please ensure follow-up with Dr. 12/7 clinic on discharge from CIR.  HTN: BP stable-continue Avapro.  HLD: Continue Lipitor 80 mg p.o. daily on discharge.  Hypothyroidism: Continue levothyroxine-dosage was recently increased to 125 mcg p.o. daily-we will need repeat TSH in the next 4 weeks or so.  Normocytic anemia: Suspect this is from acute illness-we will require repeat CBC in several weeks at PCPs office.  Dementia with delirium: Awake/alert-following  questions appropriately-remains on Seroquel.  TRH will sign off-please see above recommendations highlighted in red.

## 2021-07-07 NOTE — Progress Notes (Signed)
PROGRESS NOTE   Subjective/Complaints: Mild pleuritic pain on left lat sternal border with deep inhalation  No cough , no fever or chills   ROS:  Pt denies SOB, abd pain, CP, N/V/C/D, and vision changes, +back pain    No results found. Recent Labs    07/06/21 0502 07/07/21 0411  WBC 12.0* 10.4  HGB 9.9* 9.9*  HCT 29.5* 30.7*  PLT 258 254    Recent Labs    07/06/21 0502 07/07/21 0411  NA 133* 135  K 2.7* 3.7  CL 100 102  CO2 23 25  GLUCOSE 112* 95  BUN 14 11  CREATININE 0.58 0.68  CALCIUM 8.3* 8.6*     Intake/Output Summary (Last 24 hours) at 07/07/2021 0858 Last data filed at 07/07/2021 0752 Gross per 24 hour  Intake 240 ml  Output --  Net 240 ml         Physical Exam: Vital Signs Blood pressure (!) 118/56, pulse 92, temperature 98.9 F (37.2 C), temperature source Oral, resp. rate 16, height 5\' 4"  (1.626 m), weight 62.8 kg, SpO2 95 %.  General: No acute distress Mood and affect are appropriate Heart: Regular rate and rhythm no rubs murmurs or extra sounds Lungs: Clear to auscultation, breathing unlabored, no rales or wheezes Abdomen: Positive bowel sounds, soft nontender to palpation, nondistended Extremities: No clubbing, cyanosis, or edema Skin: No evidence of breakdown, no evidence of rash  Motor: Bilateral upper extremities: 5/5 proximal distal Left lower extremity: 5/5 proximal distal Right lower extremity: Hip flexion, knee extension 4+/5, ankle dorsiflexion 5/5  RIght  field cut  Assessment/Plan: 1. Functional deficits which require 3+ hours per day of interdisciplinary therapy in a comprehensive inpatient rehab setting. Physiatrist is providing close team supervision and 24 hour management of active medical problems listed below. Physiatrist and rehab team continue to assess barriers to discharge/monitor patient progress toward functional and medical goals  Care Tool:  Bathing     Body parts bathed by patient: Right arm, Left arm, Chest, Abdomen, Front perineal area, Buttocks, Right upper leg, Left upper leg, Face, Left lower leg, Right lower leg   Body parts bathed by helper: Face     Bathing assist Assist Level: Set up assist     Upper Body Dressing/Undressing Upper body dressing   What is the patient wearing?: Pull over shirt, Bra    Upper body assist Assist Level: Set up assist    Lower Body Dressing/Undressing Lower body dressing      What is the patient wearing?: Underwear/pull up, Pants     Lower body assist Assist for lower body dressing: Minimal Assistance - Patient > 75%     Toileting Toileting    Toileting assist Assist for toileting: Supervision/Verbal cueing     Transfers Chair/bed transfer  Transfers assist     Chair/bed transfer assist level: Supervision/Verbal cueing Chair/bed transfer assistive device: Armrests,   Ambulation assist      Assist level: Supervision/Verbal cueing Assistive device: Walker-rolling Max distance: 264ft   Walk 10 feet activity   Assist     Assist level: Supervision/Verbal cueing Assistive device: Walker-rolling   Walk 50 feet activity  Assist    Assist level: Supervision/Verbal cueing Assistive device: Walker-rolling    Walk 150 feet activity   Assist    Assist level: Supervision/Verbal cueing Assistive device: Walker-rolling    Walk 10 feet on uneven surface  activity   Assist Walk 10 feet on uneven surfaces activity did not occur: Safety/medical concerns         Wheelchair     Assist Is the patient using a wheelchair?: No             Wheelchair 50 feet with 2 turns activity    Assist            Wheelchair 150 feet activity     Assist          Blood pressure (!) 118/56, pulse 92, temperature 98.9 F (37.2 C), temperature source Oral, resp. rate 16, height 5\' 4"  (1.626 m), weight 62.8 kg, SpO2 95  %.    Medical Problem List and Plan: 1.  Decreased functional mobility with gait disturbance secondary and dense right homonymous hemianopsia due  to large acute left posterior cerebral artery infarction after robotic paraesophageal hernia repa Cognitive deficts with reduce orientation and awareness , gastropexy, upper endoscopy 06/15/2021 as well as history of right thalamic CVA 2019  Extend d/c until tomorrow given lab abnormalities.  Surgery felt recovery from esophageal surgery was good SHe had a near fall with PT yesterday , ? Orthostatic event,  Following workup completed: Will check ortho vitals Hip pain is mainly  with lateral hip soft tissue palpation  Will xray Left hip - no fx Recheck CT head - neg for new CVA UA C and S neg CBC mild elevation of WBC now normalized   10/4 Has LLL infiltrate/pneumonia- s/p 1 dose Rocephin 1G and then switched to Cefdinir per recs from pharmacy.               -antiplatelet therapy: Aspirin 81 mg daily and Plavix 75 mg day x3 weeks then Plavix alone 3. Pain Management: Tylenol as needed  10/2- pain in L rib cage- con't tylenol prn 4. Mood history of cognitive impairment:              -antipsychotic agents: Seroquel 25 mg nightly 5. Neuropsych: This patient is not fully capable of making decisions on her own behalf. 6. Skin/Wound Care: Routine skin checks 7. Fluids/Electrolytes/Nutrition: Routine in and outs BMP within acceptable range on 9/22 8.  Hypertension.  Avapro 150 mg daily.   Vitals:   07/06/21 0722 07/07/21 0500  BP: (!) 110/49 (!) 118/56  Pulse: 99 92  Resp: 18 16  Temp: 98.6 F (37 C) 98.9 F (37.2 C)  SpO2: 92% 95%   BP controlled 10/4 9.  Hyperlipidemia.  Lipitor 80 10.  Gram-negative rod UTI.  Complete course of Rocephin  11.  Hypothyroidism.  Synthroid 12.  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 LLL infiltrate: continue cefdinir 200mg  q12H. WBC slightly  elevated. Medicine consulted, appreciate Dr. 10/23 signed off. Repeat CBC 10/4 WBC now normal at 10.4K 15. Anemia: Hgb 11.6 to 9.9. Stable at 9.9g      LOS: 13 days A FACE TO FACE EVALUATION WAS PERFORMED  07/07/2021, 8:58 AM

## 2021-07-07 NOTE — Progress Notes (Signed)
Discharge Medication Review by a Pharmacist  A complete drug regimen review was completed for this patient to identify any potential clinically significant medication issues.  High Risk Drug Classes Is patient taking? Indication by Medication  Antipsychotic Yes Seroquel - Behavioral disorder  Anticoagulant No   Antibiotic Yes Cefdinir for pneumonia  Opioid No   Antiplatelet Yes Plavix for CVA  Hypoglycemics/insulin No   Vasoactive Medication Yes Avapro for blood pressure  Chemotherapy No   Other No      Type of Medication Issue Identified Description of Issue Recommendation(s)  Drug Interaction(s) (clinically significant)     Duplicate Therapy     Allergy     No Medication Administration End Date     Incorrect Dose     Additional Drug Therapy Needed     Significant med changes from prior encounter (inform family/care partners about these prior to discharge).    Other       Clinically significant medication issues were identified that warrant physician communication and completion of prescribed/recommended actions by midnight of the next day:  No  Pharmacist comments:   Time spent performing this drug regimen review (minutes):  20 minutes   Caroline [name] 07/07/2021 9:02 AM

## 2021-07-07 NOTE — Progress Notes (Signed)
PA Jesusita Oka and MD Kirsteins in with pt/family to discuss discharge. Pt/family in agreement. IV removed. Belongings gathered. Pt left per wheelchair to private vehicle. No complications noted. Mylo Red, LPN

## 2021-07-08 ENCOUNTER — Telehealth: Payer: Self-pay

## 2021-07-08 NOTE — Telephone Encounter (Signed)
Need clarification on Avapro. Is patient to take a total of 75 mg or 150 mg a day? Husband is calling for clarification.

## 2021-07-17 ENCOUNTER — Encounter (HOSPITAL_COMMUNITY): Payer: Self-pay | Admitting: Emergency Medicine

## 2021-07-17 ENCOUNTER — Telehealth: Payer: Self-pay | Admitting: Physical Medicine & Rehabilitation

## 2021-07-17 ENCOUNTER — Emergency Department (HOSPITAL_COMMUNITY): Payer: Medicare Other

## 2021-07-17 ENCOUNTER — Emergency Department (HOSPITAL_COMMUNITY)
Admission: EM | Admit: 2021-07-17 | Discharge: 2021-07-17 | Disposition: A | Discharge status: Home / Self Care | Payer: Medicare Other | Attending: Emergency Medicine | Admitting: Emergency Medicine

## 2021-07-17 DIAGNOSIS — Z7982 Long term (current) use of aspirin: Secondary | ICD-10-CM | POA: Diagnosis not present

## 2021-07-17 DIAGNOSIS — J90 Pleural effusion, not elsewhere classified: Secondary | ICD-10-CM | POA: Insufficient documentation

## 2021-07-17 DIAGNOSIS — E039 Hypothyroidism, unspecified: Secondary | ICD-10-CM | POA: Diagnosis not present

## 2021-07-17 DIAGNOSIS — Z79899 Other long term (current) drug therapy: Secondary | ICD-10-CM | POA: Insufficient documentation

## 2021-07-17 DIAGNOSIS — K219 Gastro-esophageal reflux disease without esophagitis: Secondary | ICD-10-CM | POA: Insufficient documentation

## 2021-07-17 DIAGNOSIS — R0602 Shortness of breath: Secondary | ICD-10-CM | POA: Diagnosis not present

## 2021-07-17 DIAGNOSIS — R071 Chest pain on breathing: Secondary | ICD-10-CM | POA: Insufficient documentation

## 2021-07-17 DIAGNOSIS — R0789 Other chest pain: Secondary | ICD-10-CM | POA: Diagnosis present

## 2021-07-17 DIAGNOSIS — I10 Essential (primary) hypertension: Secondary | ICD-10-CM | POA: Insufficient documentation

## 2021-07-17 LAB — CBC WITH DIFFERENTIAL/PLATELET
Abs Immature Granulocytes: 0.08 10*3/uL — ABNORMAL HIGH (ref 0.00–0.07)
Basophils Absolute: 0.1 10*3/uL (ref 0.0–0.1)
Basophils Relative: 0 %
Eosinophils Absolute: 0.3 10*3/uL (ref 0.0–0.5)
Eosinophils Relative: 2 %
HCT: 35.6 % — ABNORMAL LOW (ref 36.0–46.0)
Hemoglobin: 11.5 g/dL — ABNORMAL LOW (ref 12.0–15.0)
Immature Granulocytes: 1 %
Lymphocytes Relative: 11 %
Lymphs Abs: 1.6 10*3/uL (ref 0.7–4.0)
MCH: 28.6 pg (ref 26.0–34.0)
MCHC: 32.3 g/dL (ref 30.0–36.0)
MCV: 88.6 fL (ref 80.0–100.0)
Monocytes Absolute: 0.8 10*3/uL (ref 0.1–1.0)
Monocytes Relative: 6 %
Neutro Abs: 11.3 10*3/uL — ABNORMAL HIGH (ref 1.7–7.7)
Neutrophils Relative %: 80 %
Platelets: 468 10*3/uL — ABNORMAL HIGH (ref 150–400)
RBC: 4.02 MIL/uL (ref 3.87–5.11)
RDW: 16.2 % — ABNORMAL HIGH (ref 11.5–15.5)
WBC: 14.1 10*3/uL — ABNORMAL HIGH (ref 4.0–10.5)
nRBC: 0 % (ref 0.0–0.2)

## 2021-07-17 LAB — BASIC METABOLIC PANEL
Anion gap: 11 (ref 5–15)
BUN: 14 mg/dL (ref 8–23)
CO2: 24 mmol/L (ref 22–32)
Calcium: 9 mg/dL (ref 8.9–10.3)
Chloride: 99 mmol/L (ref 98–111)
Creatinine, Ser: 0.81 mg/dL (ref 0.44–1.00)
GFR, Estimated: >60 mL/min (ref >60)
Glucose, Bld: 105 mg/dL — ABNORMAL HIGH (ref 70–99)
Potassium: 3.7 mmol/L (ref 3.5–5.1)
Sodium: 134 mmol/L — ABNORMAL LOW (ref 135–145)

## 2021-07-17 LAB — BRAIN NATRIURETIC PEPTIDE: B Natriuretic Peptide: 43.2 pg/mL (ref 0.0–100.0)

## 2021-07-17 MED ORDER — IOHEXOL 350 MG/ML SOLN
100.0000 mL | Freq: Once | INTRAVENOUS | Status: AC | PRN
  Administered 2021-07-17: 100 mL via INTRAVENOUS

## 2021-07-17 NOTE — ED Triage Notes (Signed)
Pt with recent hospitalization for stroke and PNA. Pt woke up with pain on right side today. Denies CP or SOB. Pt came from spine specialist after xrays and did not see any rib fx. Sent for possible PE, pt on plavix.

## 2021-07-17 NOTE — Telephone Encounter (Signed)
To contact daughter call her Cell [phone] Ms Duffey went home with pneumonia, completed her antibiotics today. Currently is having pain in ribs and back, Daughter is very concerned about her being seen prior to the weekend.  Please advise what patient should do for pain over weekend.  Has appointment next Friday.

## 2021-07-17 NOTE — ED Provider Notes (Signed)
Emergency Medicine Provider Triage Evaluation Note  Caroline [name] , a 85 y.o. female  was evaluated in triage.  Pt complains of CP.  Review of Systems  Positive: R side upper back/cp Negative: Fever, sob, cough  Physical Exam  BP (!) 145/54 (BP Location: Right Arm)   Pulse 100   Temp 98.4 F (36.9 C) (Oral)   Resp 18   SpO2 97%  Gen:   Awake, no distress   Resp:  Normal effort  MSK:   Moves extremities without difficulty  Other:    Medical Decision Making  Medically screening exam initiated at 3:53 PM.  Appropriate orders placed.  Caroline [name] was informed that the remainder of the evaluation will be completed by another provider, this initial triage assessment does not replace that evaluation, and the importance of remaining in the ED until their evaluation is complete.  Pt recommended by her specialist to come for PE r/o.  She has esophageal surgery in September.  For the past few days she has been more active, but now having pain to her r side of back.  Had xray of ribs done outpt and unremarkable, but sent here for PE r/o.   Fayrene Helper, PA-C 07/17/21 1600    Blane Ohara, MD 07/22/21 [phone]

## 2021-07-17 NOTE — ED Notes (Signed)
Pt daughter stated mom was sent here to r/o PE. Pt recently finished 7 day supply antibiotics for mild pneumonia of the left side. Pt currently has sharp pain in lower rib on right side. +2 Radial and dorsal pedis pedals.

## 2021-07-17 NOTE — ED Provider Notes (Signed)
[facility] EMERGENCY DEPARTMENT Provider Note   CSN: [id number] Arrival date & time: 07/17/21  1531     History Chief Complaint  Patient presents with   side pain    Caroline [name] is a 85 y.o. female.  85 year old female presents to the emergency department for evaluation of right sided chest wall pain.  Patient noticed the pain upon waking this morning.  It is worse with movement as well as deep breathing.  She was recently discharged from the hospital after admission for CVA, left lower lobe pneumonia, paraesophageal hernia repair.  She completed her course of antibiotics 3 days ago.  Was home following her hospital discharge until yesterday when she was more active and went to multiple specialist appointments.  Was evaluated for her chest pain today and advised to come to the emergency department to rule out pulmonary embolus.  She has not had any shortness of breath, fevers, syncope or near-syncope, hemoptysis, leg swelling, N/V.  Takes ASA/Plavix, but is not on chronic anticoagulation.   The history is provided by the patient. No language interpreter was used.      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 Cheek   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   Bridge Left Nose, and Left Neck   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 Cheek Tx: curret x 3 and 5FU   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   Bridge of nose tx Cx3 and  5FU, Left neck Tx Cx3 and 5FU   Thrombophlebitis     Patient Active Problem List   Diagnosis Date Noted   Dysphagia, post-stroke    Hypoalbuminemia due to protein-calorie malnutrition (HCC)    Benign essential HTN    Dyslipidemia    Delirium due to multiple etiologies 06/17/2021   S/P repair of paraesophageal hernia 06/15/2021   CVA (cerebral vascular accident) (HCC) 02/09/2018   Hypothyroidism 02/08/2018   Essential hypertension 02/08/2018   Acute ischemic stroke (HCC) 02/08/2018   Acute lower UTI 02/08/2018   Acute cystitis without hematuria    Right thalamic stroke (HCC)    Hyperlipidemia     Past Surgical History:  Procedure Laterality Date   ABDOMINAL HYSTERECTOMY  1979   CATARACT EXTRACTION, BILATERAL     knee surgery Left    VARICOSE VEIN SURGERY Left    XI ROBOTIC ASSISTED PARAESOPHAGEAL HERNIA REPAIR N/A 06/15/2021   Procedure: XI ROBOTIC ASSISTED PARAESOPHAGEAL HERNIA REPAIR, GASTROPEXY, UPPER ENDOSCOPY;  Surgeon: Berna Bue, MD;  Location: [facility];  Service: General;  Laterality: N/A;     OB History   No obstetric history on file.     Family History  Problem Relation Age of Onset   Arthritis Mother        pt denies   Hypertension Mother  pt denies   Obesity Sister    Cancer Maternal Uncle        type unknown   Colon cancer Neg Hx    Stomach cancer Neg Hx    Esophageal cancer Neg Hx    Pancreatic cancer Neg Hx     Social History   Tobacco Use   Smoking status: Never   Smokeless tobacco: Never  Vaping Use   Vaping Use: Never used  Substance Use Topics   Alcohol use: No   Drug use: Not Currently    Home Medications Prior to Admission medications   Medication Sig Start Date End Date Taking? Authorizing Provider  acetaminophen (TYLENOL) 325 MG tablet Take 2 tablets (650 mg total) by mouth every 6 (six) hours as needed for mild pain, moderate pain, fever or headache. 07/02/21   Angiulli, Mcarthur Rossetti, PA-C  Ascorbic Acid (VITAMIN C) 100  MG tablet Take 1 tablet (100 mg total) by mouth every other day. 07/02/21   Angiulli, Mcarthur Rossetti, PA-C  aspirin EC 81 MG EC tablet Swallow whole.  Continue for 7 days and stop 07/02/21   Angiulli, Mcarthur Rossetti, PA-C  atorvastatin (LIPITOR) 80 MG tablet Take 1 tablet (80 mg total) by mouth at bedtime. 07/02/21   Angiulli, Mcarthur Rossetti, PA-C  cefdinir (OMNICEF) 300 MG capsule Take 1 capsule (300 mg total) by mouth every 12 (twelve) hours. 07/06/21   Angiulli, Mcarthur Rossetti, PA-C  Cholecalciferol (VITAMIN D-3) 25 MCG (1000 UT) CAPS Take 1 capsule (1,000 Units total) by mouth daily. 07/02/21   Angiulli, Mcarthur Rossetti, PA-C  clopidogrel (PLAVIX) 75 MG tablet Take 1 tablet (75 mg total) by mouth daily. 07/02/21   Angiulli, Mcarthur Rossetti, PA-C  Coenzyme Q10 (COQ10 PO) Take 1 tablet by mouth daily.    [provider]  docusate sodium (COLACE) 100 MG capsule Take 2 capsules (200 mg total) by mouth 2 (two) times daily. 07/02/21   Angiulli, Mcarthur Rossetti, PA-C  irbesartan (AVAPRO) 75 MG tablet Take 1 tablet (75 mg total) by mouth daily. 07/06/21   Angiulli, Mcarthur Rossetti, PA-C  irbesartan (AVAPRO) 75 MG tablet Take 1 tablet (75 mg total) by mouth daily. 07/07/21   Angiulli, Mcarthur Rossetti, PA-C  levothyroxine (SYNTHROID) 125 MCG tablet Take 1 tablet (125 mcg total) by mouth daily at 6 (six) AM. 07/02/21   Angiulli, Mcarthur Rossetti, PA-C  Omega-3 Fatty Acids (FISH OIL PO) Take 1,000 mg by mouth daily.    [provider]  pantoprazole (PROTONIX) 40 MG tablet Take 1 tablet (40 mg total) by mouth 2 (two) times daily before a meal. 07/02/21   Angiulli, Mcarthur Rossetti, PA-C  Potassium 99 MG TABS Take 99 mg by mouth daily.    [provider]  QUEtiapine (SEROQUEL) 25 MG tablet Take 1 tablet (25 mg total) by mouth at bedtime. 07/02/21   Angiulli, Mcarthur Rossetti, PA-C  vitamin B-12 (CYANOCOBALAMIN) 250 MCG tablet Take 1 tablet (250 mcg total) by mouth daily. 07/02/21   Angiulli, Mcarthur Rossetti, PA-C    Allergies    Gluten meal, Wheat bran, Adhesive [tape], Codeine,  Other, and Penicillins  Review of Systems   Review of Systems Ten systems reviewed and are negative for acute change, except as noted in the HPI.    Physical Exam Updated Vital Signs BP (!) 119/51 (BP Location: Left Arm)   Pulse 93   Temp 98.4 F (36.9 C) (Oral)   Resp 17   SpO2 95%   Physical Exam Vitals and nursing note  reviewed.  Constitutional:      General: She is not in acute distress.    Appearance: She is well-developed. She is not diaphoretic.     Comments: Nontoxic appearing and in NAD  HENT:     Head: Normocephalic and atraumatic.  Eyes:     General: No scleral icterus.    Conjunctiva/sclera: Conjunctivae normal.  Cardiovascular:     Rate and Rhythm: Normal rate and regular rhythm.     Pulses: Normal pulses.  Pulmonary:     Effort: Pulmonary effort is normal. No respiratory distress.     Comments: R lower chest wall TTP without crepitus or deformity. Lungs CTAB. Chest:     Chest wall: Tenderness present.  Musculoskeletal:        General: Normal range of motion.     Cervical back: Normal range of motion.     Comments: No BLE edema.  Skin:    General: Skin is warm and dry.     Coloration: Skin is not pale.     Findings: No erythema or rash.  Neurological:     Mental Status: She is alert and oriented to person, place, and time.     Coordination: Coordination normal.     Comments: GCS 15. Speech is clear, goal oriented. Moving all extremities purposefully.   Psychiatric:        Behavior: Behavior normal.    ED Results / Procedures / Treatments   Labs (all labs ordered are listed, but only abnormal results are displayed) Labs Reviewed  BASIC METABOLIC PANEL - Abnormal; Notable for the following components:      Result Value   Sodium 134 (*)    Glucose, Bld 105 (*)    All other components within normal limits  CBC WITH DIFFERENTIAL/PLATELET - Abnormal; Notable for the following components:   WBC 14.1 (*)    Hemoglobin 11.5 (*)    HCT 35.6 (*)     RDW 16.2 (*)    Platelets 468 (*)    Neutro Abs 11.3 (*)    Abs Immature Granulocytes 0.08 (*)    All other components within normal limits  BRAIN NATRIURETIC PEPTIDE    EKG EKG Interpretation  Date/Time:  Friday July 17 2021 15:43:12 EDT Ventricular Rate:  100 PR Interval:  174 QRS Duration: 80 QT Interval:  364 QTC Calculation: 469 R Axis:   44 Text Interpretation: Normal sinus rhythm Nonspecific ST abnormality Abnormal ECG Confirmed by Gloris Manchester ([id number]) on 07/17/2021 10:15:42 PM  Radiology CT Angio Chest PE W and/or Wo Contrast  Result Date: 07/17/2021 CLINICAL DATA:  High probability of pulmonary embolism suspected. EXAM: CT ANGIOGRAPHY CHEST WITH CONTRAST TECHNIQUE: Multidetector CT imaging of the chest was performed using the standard protocol during bolus administration of intravenous contrast. Multiplanar CT image reconstructions and MIPs were obtained to evaluate the vascular anatomy. CONTRAST:  OMNIPAQUE IOHEXOL 350 MG/ML SOLN COMPARISON:  Plain film of 07/04/2021. No prior CT. FINDINGS: Cardiovascular: The quality of this exam for evaluation of pulmonary embolism is good. No evidence of pulmonary embolism. Aortic atherosclerosis. Tortuous thoracic aorta. Normal heart size with three-vessel coronary artery calcification. Mediastinum/Nodes: Subcarinal node measures 1.5 cm on 61/5. Right hilar node measures 1.6 cm on 60/5. There is also prominent right infrahilar nodal tissue, including at 1.2 cm on 70/5. Lungs/Pleura: Small bilateral pleural effusions. Left greater than right base dependent atelectasis. Upper Abdomen: Normal imaged portions of the liver, spleen, stomach, adrenal glands, left kidney. Musculoskeletal: Moderate convex left thoracic spine curvature.  Review of the MIP images confirms the above findings. IMPRESSION: 1.  No evidence of pulmonary embolism. 2. Small bilateral pleural effusions with dependent atelectasis. Question fluid overload. 3. Mild mediastinal  and right hilar/infrahilar adenopathy. Possibly reactive and can be seen in the setting of fluid overload. Recommend contrast enhanced chest CT follow-up at 3 months to confirm stability or resolution. 4. Coronary artery atherosclerosis. Aortic Atherosclerosis (ICD10-I70.0). Electronically Signed   By: Jeronimo Greaves M.D.   On: 07/17/2021 18:05    Procedures Procedures   Medications Ordered in ED Medications  iohexol (OMNIPAQUE) 350 MG/ML injection 100 mL (100 mLs Intravenous Contrast Given 07/17/21 1748)    ED Course  I have reviewed the triage vital signs and the nursing notes.  Pertinent labs & imaging results that were available during my care of the patient were reviewed by me and considered in my medical decision making (see chart for details).    MDM Rules/Calculators/A&P                           85 year old female presents to the emergency department for evaluation of right sided chest pain.  Her symptoms are consistent with musculoskeletal etiology.  Pain is reproducible on palpation.  Is worse with movement as well as breathing.  Was sent for evaluation of pulmonary embolus.  CTA reassuring without evidence of PE.  Also no evidence of pneumonia, rib fracture, pneumothorax.  Small pleural effusions noted with atelectasis.  Radiology read raised question for fluid overload; however, patient has a normal BNP and does not appear clinically fluid overloaded on exam.  No peripheral edema.  She denies shortness of breath and has not been hypoxic.  Plan for continued outpatient supportive care.  The patient received a prescription for tramadol earlier today.  Encouraged patient to use this for pain control, though did warn against the potential for sedating side effects.  Return precautions discussed and provided. Patient discharged in stable condition with no unaddressed concerns.   Final Clinical Impression(s) / ED Diagnoses Final diagnoses:  Right-sided chest wall pain    Rx / DC  Orders ED Discharge Orders     None        Antony Madura, PA-C 07/17/21 2310    Gloris Manchester, MD 07/19/21 1229

## 2021-07-21 ENCOUNTER — Other Ambulatory Visit: Payer: Self-pay

## 2021-07-21 ENCOUNTER — Encounter: Payer: Self-pay | Admitting: Registered Nurse

## 2021-07-21 ENCOUNTER — Encounter: Payer: Medicare Other | Attending: Registered Nurse | Admitting: Registered Nurse

## 2021-07-21 VITALS — BP 111/63 | HR 87 | Temp 97.8°F | Ht 64.0 in | Wt 140.8 lb

## 2021-07-21 DIAGNOSIS — E039 Hypothyroidism, unspecified: Secondary | ICD-10-CM | POA: Insufficient documentation

## 2021-07-21 DIAGNOSIS — I10 Essential (primary) hypertension: Secondary | ICD-10-CM | POA: Insufficient documentation

## 2021-07-21 DIAGNOSIS — E785 Hyperlipidemia, unspecified: Secondary | ICD-10-CM | POA: Diagnosis not present

## 2021-07-21 NOTE — Progress Notes (Signed)
Subjective:    Patient ID: Caroline [name], female    DOB: 1935/12/27, 85 y.o.   MRN: [id number]  HPI: Caroline [name] is a 85 y.o. 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 She presented to [facility] on 06/15/2021. Dr Fredricka Bonine:  HPI:   History of Present Illness:    85 year old woman who has been followed for a symptomatic paraesophageal hernia by Dr. Myrtie Neither.  Describes episodes of intermittent heartburn and episodes of severe regurgitation.  She has eliminated many things from her diet including coffee, she keeps her head of bed elevated.  Symptoms seem unpredictable despite this.  No dysphagia or odynophagia.  Denies weight loss.  She had an endoscopy in February 2020 confirming grade D reflux esophagitis and again June 2020, with little improvement in endoscopic findings on high-dose PPI and with antireflux measures.  Biopsies negative.  She has had a normal gastric emptying study in 2020.  She has been encouraged to pursue surgical repair of her hiatal hernia for over a year but has been reluctant to do so given her age.   Upper GI 02/13/2021: Moderate hiatal hernia, some delay of passing the barium tablet at the level of the hernia but this did subsequently passed the sips of water.  Moderate reflux, mild esophageal dysmotility and mildly patulous distal thoracic esophagus.  She underwent on 06/15/2021 by Dr Fredricka Bonine.  Procedure performed: xi robotic paraesophageal hernia repair, gastropexy, upper endoscopy Post procedure noted with disorientation.  CT Head WO Contrast  IMPRESSION: Large acute left posterior cerebral artery territory cortical/subcortical infarct. Local mass effect with partial effacement of the left lateral ventricle. No midline shift. No evidence of hemorrhagic conversion.  MR Angio: MR Brain WO Contrast IMPRESSION: 1. Large  acute left PCA territory infarct without hemorrhage or mass effect. 2. Occlusion of the left PCA at the distal P2 segment. 3. Severe atrophy and chronic small vessel ischemia. 4. Mild narrowing of the right carotid bifurcation without hemodynamically significant stenosis by NASCET criteria. Neurology consulted.  Ms. Steffy was admitted to inpatient rehabilitation on 06/24/2021 and discharged home on 07/07/2021.  She denies any pain at this time. She rated her pain 4 on History and Physical flow sheet.  She is receiving Home Health with [facility]. Also reports she has a good appetite.   Husband in room all questions answered. Spoke with daughter in lobby, all questions answered.     Pain Inventory Average Pain 6 Pain Right Now 4 My pain is intermittent, sharp, and stabbing  LOCATION OF PAIN  back, right hip  BOWEL Number of stools per week: 5  Oral laxative use Yes  Type of laxative stool softeners   BLADDER Pads In and out cath, frequency no Able to self cath No  Bladder incontinence Yes  Frequent urination No  Leakage with coughing No  Difficulty starting stream No  Incomplete bladder emptying No    Mobility use a walker ability to climb steps?  yes do you drive?  no Do you have any goals in this area?  yes  Function retired I need assistance with the following:  feeding, dressing, bathing, toileting, meal prep, household duties, and shopping Do you have any goals in this area?  yes  Neuro/Psych bladder control problems weakness trouble walking confusion  Prior Studies x-rays  Physicians involved in your care Any changes since last visit?  no   Family History  Problem Relation Age of Onset   Arthritis Mother        pt denies   Hypertension Mother        pt denies   Obesity Sister    Cancer Maternal Uncle        type unknown   Colon cancer Neg Hx    Stomach cancer Neg Hx    Esophageal cancer Neg Hx    Pancreatic cancer Neg Hx    Social  History   Socioeconomic History   Marital status: Married    Spouse name: [name]   Number of children: 3   Years of education: 12   Highest education level: Not on file  Occupational History   Occupation: retired  Tobacco Use   Smoking status: Never   Smokeless tobacco: Never  Vaping Use   Vaping Use: Never used  Substance and Sexual Activity   Alcohol use: No   Drug use: Not Currently   Sexual activity: Never  Other Topics Concern   Not on file  Social History Narrative   Lives with husband   Right Handed   Drinks no caffeine   Social Determinants of Health   Financial Resource Strain: Not on file  Food Insecurity: Not on file  Transportation Needs: Not on file  Physical Activity: Not on file  Stress: Not on file  Social Connections: Not on file   Past Surgical History:  Procedure Laterality Date   ABDOMINAL HYSTERECTOMY  1979   CATARACT EXTRACTION, BILATERAL     knee surgery Left    VARICOSE VEIN SURGERY Left    XI ROBOTIC ASSISTED PARAESOPHAGEAL HERNIA REPAIR N/A 06/15/2021   Procedure: XI ROBOTIC ASSISTED PARAESOPHAGEAL HERNIA REPAIR, GASTROPEXY, UPPER ENDOSCOPY;  Surgeon: Berna Bue, MD;  Location: [facility];  Service: General;  Laterality: N/A;   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   Review of Systems  Constitutional: Negative.   HENT: Negative.    Eyes: Negative.   Respiratory: Negative.    Cardiovascular: Negative.   Gastrointestinal: Negative.   Endocrine: Negative.   Genitourinary:  Positive for dysuria.  Musculoskeletal:  Positive for back pain and gait problem.  Skin: Negative.   Allergic/Immunologic: Negative.   Hematological: Negative.  Psychiatric/Behavioral:  Positive for confusion and dysphoric mood. The patient is not hyperactive.       Objective:   Physical Exam Vitals and nursing note reviewed.  Constitutional:      Appearance: Normal appearance.  Cardiovascular:     Rate and Rhythm: Normal rate and regular rhythm.     Pulses: Normal pulses.     Heart sounds: Normal heart sounds.  Pulmonary:     Effort: Pulmonary effort is normal.     Breath sounds: Normal breath sounds.  Musculoskeletal:     Cervical back: Normal range of motion and neck supple.     Comments: Normal Muscle Bulk and Muscle Testing Reveals:  Upper Extremities: Full ROM and Muscle Strength 5/5 Lower Extremities : Right: Decreased ROM and Muscle Strength 4/5  Right Lower Extremity Flexion Produces Pain into her Patella Left Lower Extremity : Full ROM and Muscle Strength 5/5 Arises from Table Slowly using walker for support ShufflingGait     Skin:    General: Skin is warm and dry.  Neurological:     Mental Status: She is alert and oriented to person, place, and time.  Psychiatric:        Mood and Affect: Mood normal.        Behavior: Behavior normal.         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 Continue to monitor.  2, Essential Hypertension: Continue current medication regimen. She has a scheduled appointment with PCP.Keep blood pressure log. Mr. Brabson and daughter verbalizes understanding. Continue to monitor.  3. Dyslipidemia.Continue current medication regimen. Continue to monitor.   F/U with Dr Wynn Banker in 4- 6 weeks

## 2021-07-28 ENCOUNTER — Other Ambulatory Visit: Payer: Self-pay

## 2021-07-28 ENCOUNTER — Encounter: Payer: Self-pay | Admitting: Physician Assistant

## 2021-07-28 ENCOUNTER — Ambulatory Visit (INDEPENDENT_AMBULATORY_CARE_PROVIDER_SITE_OTHER): Payer: Medicare Other | Admitting: Physician Assistant

## 2021-07-28 VITALS — BP 132/60 | HR 92 | Temp 98.0°F | Ht 64.0 in | Wt 143.1 lb

## 2021-07-28 DIAGNOSIS — Z86718 Personal history of other venous thrombosis and embolism: Secondary | ICD-10-CM

## 2021-07-28 DIAGNOSIS — Z8719 Personal history of other diseases of the digestive system: Secondary | ICD-10-CM

## 2021-07-28 DIAGNOSIS — G3184 Mild cognitive impairment, so stated: Secondary | ICD-10-CM

## 2021-07-28 DIAGNOSIS — Z7689 Persons encountering health services in other specified circumstances: Secondary | ICD-10-CM

## 2021-07-28 DIAGNOSIS — Z8673 Personal history of transient ischemic attack (TIA), and cerebral infarction without residual deficits: Secondary | ICD-10-CM

## 2021-07-28 DIAGNOSIS — M7989 Other specified soft tissue disorders: Secondary | ICD-10-CM | POA: Diagnosis not present

## 2021-07-28 DIAGNOSIS — I10 Essential (primary) hypertension: Secondary | ICD-10-CM

## 2021-07-28 DIAGNOSIS — K219 Gastro-esophageal reflux disease without esophagitis: Secondary | ICD-10-CM

## 2021-07-28 DIAGNOSIS — G8929 Other chronic pain: Secondary | ICD-10-CM

## 2021-07-28 DIAGNOSIS — M549 Dorsalgia, unspecified: Secondary | ICD-10-CM

## 2021-07-28 DIAGNOSIS — E039 Hypothyroidism, unspecified: Secondary | ICD-10-CM | POA: Diagnosis not present

## 2021-07-28 DIAGNOSIS — M25561 Pain in right knee: Secondary | ICD-10-CM | POA: Diagnosis not present

## 2021-07-28 DIAGNOSIS — E785 Hyperlipidemia, unspecified: Secondary | ICD-10-CM

## 2021-07-28 DIAGNOSIS — Z9889 Other specified postprocedural states: Secondary | ICD-10-CM

## 2021-07-28 MED ORDER — FUROSEMIDE 20 MG PO TABS: 20.0000 mg | ORAL_TABLET | Freq: Every day | ORAL | 0 refills | Status: AC | PRN

## 2021-07-28 MED ORDER — METHYLPREDNISOLONE 4 MG PO TBPK: ORAL_TABLET | ORAL | 0 refills | Status: DC

## 2021-07-28 NOTE — Progress Notes (Signed)
New Patient Office Visit  Subjective:  Patient ID: Caroline [name], female    DOB: [birth year]  Age: 85 y.o. MRN: [id number]  CC:  Chief Complaint  Patient presents with   New Patient (Initial Visit)     HPI Enslee [name] presents to establish care.  Patient is accompanied by her daughter and husband.  Patient has a past medical history of hyperlipidemia, hypertension, hypothyroidism, impairment, chronic back pain, GERD, CVA, overactive bladder, scoliosis and SCCIS.  Today patient has complaints of right knee pain and left lower leg pain.  Right knee pain has been ongoing for about a week.  Patient was hospitalized September 23, 2021 for large acute left PCA territory infarct without hemorrhage post robotic paraesophageal hernia repair was admitted for inpatient rehabilitation for PT, ST and OT.  Since then patient's mobility has diminished.  Takes Tylenol only needed for joint pain.  Left lower leg swelling has been ongoing for 3 days.  Patient's husband reports she has a water pill at home and has not been taking medication.  Do not recall medication name.  Patient has a history of DVT.  Denies left calf pain or redness.  Patient takes quetiapine for sundowners and sleep, patient's daughter reports more behavior disturbance than before and takes longer to calm her down and put her to bed.  Patient is followed by various specialist including neurology and neurosurgery/pain managment.  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   Right Cheek   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   Bridge Left Nose, and Left Neck   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 Cheek Tx: curret x 3 and 5FU   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   Bridge of nose tx Cx3 and 5FU, Left neck Tx Cx3 and 5FU   Thrombophlebitis     Past Surgical History:  Procedure Laterality Date   ABDOMINAL HYSTERECTOMY  1979   CATARACT EXTRACTION, BILATERAL     knee surgery Left    VARICOSE VEIN SURGERY Left    XI ROBOTIC ASSISTED PARAESOPHAGEAL HERNIA REPAIR N/A 06/15/2021   Procedure: XI ROBOTIC ASSISTED PARAESOPHAGEAL HERNIA REPAIR, GASTROPEXY, UPPER ENDOSCOPY;  Surgeon: Berna Bue, MD;  Location: [facility];  Service: General;  Laterality: N/A;    Family History  Problem Relation Age of Onset   Arthritis Mother        pt denies   Hypertension Mother        pt denies   Obesity Sister    Cancer Maternal Uncle        type unknown   Colon cancer Neg Hx    Stomach cancer Neg Hx    Esophageal cancer Neg Hx    Pancreatic cancer Neg Hx     Social History   Socioeconomic History   Marital status: Married    Spouse name:  [name]   Number of children: 3   Years of education: 12   Highest education level: Not on file  Occupational History   Occupation: retired  Tobacco Use   Smoking status: Never   Smokeless tobacco: Never  Vaping Use   Vaping Use: Never used  Substance and Sexual Activity   Alcohol use: No   Drug use: Not Currently   Sexual activity: Never  Other Topics Concern   Not on file  Social History Narrative   Lives with husband   Right Handed   Drinks no caffeine   Social Determinants of Health   Financial Resource Strain: Not on file  Food Insecurity: Not on file  Transportation Needs: Not on file  Physical Activity: Not on file  Stress: Not on file  Social Connections: Not on file  Intimate Partner Violence: Not on file    ROS Review of  Systems Review of Systems:  A fourteen system review of systems was performed and found to be positive as per HPI.  Objective:   Today's Vitals: BP 132/60   Pulse 92   Temp 98 F (36.7 C)   Ht $R'5\' 4"'yK$  (1.626 m)   Wt 143 lb 1.9 oz (64.9 kg)   SpO2 95%   BMI 24.57 kg/m   Physical Exam General:  Well Developed, well nourished, appropriate for stated age.  Neuro:  Alert and oriented,  extra-ocular muscles intact  HEENT:  Normocephalic, atraumatic, neck supple Skin:  no gross rash, warm, pink. Cardiac:  RRR, S1 S2 Respiratory:  CTA B/L, Not using accessory muscles, speaking in full sentences- unlabored. Vascular:  Ext warm, no cyanosis apprec.; cap RF less 2 sec. 2+ pitting edema LLE, Psych:  No HI/SI, judgement and insight good, Euthymic mood. Full Affect.  Assessment & Plan:   Problem List Items Addressed This Visit       Cardiovascular and Mediastinum   Benign essential HTN   Relevant Medications   furosemide (LASIX) 20 MG tablet     Endocrine   Hypothyroidism   Relevant Orders   TSH (Completed)   T4, free (Completed)   T3 (Completed)     Other   S/P repair of paraesophageal hernia   Dyslipidemia   Other Visit Diagnoses     Encounter to establish care    -  Primary   Swelling of left lower extremity       Relevant Medications   furosemide (LASIX) 20 MG tablet   Other Relevant Orders   VAS [place] LOWER EXTREMITY VENOUS (DVT)   Comp Met (CMET) (Completed)   CBC w/Diff (Completed)   Acute pain of right knee       Relevant Medications   methylPREDNISolone (MEDROL DOSEPAK) 4 MG TBPK tablet   MCI (mild cognitive impairment)       History of CVA (cerebrovascular accident)       History of DVT (deep vein thrombosis)       Gastroesophageal reflux disease, unspecified whether esophagitis present       Chronic back pain, unspecified back location, unspecified back pain laterality       Relevant Medications   methylPREDNISolone (MEDROL DOSEPAK) 4 MG TBPK tablet       Encounter to establish care, acute pain of right knee, swelling of left lower extremity: -Reviewed hospital records 06/24/2021 and recent ED visit 07/17/2021. -Discussed with patient and family knee pain likely related to arthritis due to decreased mobility and sedentary lifestyle. Due to history of CVA will avoid NSAIDs  and start corticosteroid therapy.  Recommend to apply topical anti-inflammatory such as Voltaren gel. -Due to history of DVT will obtain Doppler ultrasound to rule out thrombus of left lower extremity.  BNP checked during ED visit and normal.  Recommend to continue compression socks, low-sodium diet and take Lasix as needed for edema. Take potassium supplement when taking dose of diuretic. Will collect CMP to monitor renal function and electrolytes. -Will reviewed previous PCP records once obtained. -Will reassess symptoms at follow-up visit in 4 weeks.  Dyslipidemia: -Continue atorvastatin 80 mg. -Will continue to monitor.  Bening essential hypertension: -BP in office stable. -Continue irbesartan 75 mg daily. -Will continue to monitor.  Mild cognitive impairment: -Followed by neurology. -Discussed with patient and family can trial 50 mg of quetiapine to help with behavioral disturbance.  If patient unable to tolerate increased dose recommend to resume 25 mg.   Hypothyroidism: -Patient on levothyroxine 125 mcg.  Labs from 06/22/2021: TSH 8.036, free T4 0.99, free T3 1.3 -Will repeat thyroid labs.  Pending results will adjust medication if indicated.  History of CVA: -Followed by Neurology. -Continue Plavix and will continue with secondary prevention measures.  GERD: -On pantoprazole 40 mg BID. -Will continue to monitor.  Chronic back pain: -Followed by Neurosurgery/Physical Medicine.   Outpatient Encounter Medications as of 07/28/2021  Medication Sig   furosemide (LASIX) 20 MG tablet Take 1 tablet (20 mg total) by mouth daily as needed for edema.    methylPREDNISolone (MEDROL DOSEPAK) 4 MG TBPK tablet Take as directed on package.   acetaminophen (TYLENOL) 325 MG tablet Take 2 tablets (650 mg total) by mouth every 6 (six) hours as needed for mild pain, moderate pain, fever or headache.   Ascorbic Acid (VITAMIN C) 100 MG tablet Take 1 tablet (100 mg total) by mouth every other day.   atorvastatin (LIPITOR) 80 MG tablet Take 1 tablet (80 mg total) by mouth at bedtime.   Cholecalciferol (VITAMIN D-3) 25 MCG (1000 UT) CAPS Take 1 capsule (1,000 Units total) by mouth daily.   clopidogrel (PLAVIX) 75 MG tablet Take 1 tablet (75 mg total) by mouth daily.   Coenzyme Q10 (COQ10 PO) Take 1 tablet by mouth daily.   docusate sodium (COLACE) 100 MG capsule Take 2 capsules (200 mg total) by mouth 2 (two) times daily.   irbesartan (AVAPRO) 75 MG tablet Take 1 tablet (75 mg total) by mouth daily.   levothyroxine (SYNTHROID) 125 MCG tablet Take 1 tablet (125 mcg total) by mouth daily at 6 (six) AM.   Omega-3 Fatty Acids (FISH OIL PO) Take 1,000 mg by mouth daily.   pantoprazole (PROTONIX) 40 MG tablet Take 1 tablet (40 mg total) by mouth 2 (two) times daily before a meal.   Potassium 99 MG TABS Take 99 mg by mouth daily.   QUEtiapine (SEROQUEL) 25 MG tablet Take 1 tablet (25 mg total) by mouth at bedtime.   vitamin B-12 (CYANOCOBALAMIN) 250 MCG tablet Take 1 tablet (250 mcg total) by mouth daily.   No facility-administered encounter medications on file as of 07/28/2021.    Follow-up: Return in about 4 weeks (around 08/25/2021) for edema, HTN, knee pain.   Lorrene Reid, PA-C

## 2021-07-29 ENCOUNTER — Other Ambulatory Visit: Payer: Medicare Other

## 2021-07-30 ENCOUNTER — Observation Stay (HOSPITAL_COMMUNITY)
Admission: EM | Admit: 2021-07-30 | Discharge: 2021-07-30 | Disposition: A | Discharge status: Home / Self Care | Payer: Medicare Other | Attending: Internal Medicine | Admitting: Internal Medicine

## 2021-07-30 ENCOUNTER — Other Ambulatory Visit: Payer: Self-pay

## 2021-07-30 ENCOUNTER — Encounter (HOSPITAL_COMMUNITY): Payer: Self-pay

## 2021-07-30 ENCOUNTER — Ambulatory Visit (INDEPENDENT_AMBULATORY_CARE_PROVIDER_SITE_OTHER)
Admission: RE | Admit: 2021-07-30 | Discharge: 2021-07-30 | Disposition: A | Discharge status: Home / Self Care | Payer: Medicare Other | Source: Ambulatory Visit | Attending: Physician Assistant | Admitting: Physician Assistant

## 2021-07-30 DIAGNOSIS — Z79899 Other long term (current) drug therapy: Secondary | ICD-10-CM | POA: Insufficient documentation

## 2021-07-30 DIAGNOSIS — E039 Hypothyroidism, unspecified: Secondary | ICD-10-CM | POA: Diagnosis not present

## 2021-07-30 DIAGNOSIS — M7989 Other specified soft tissue disorders: Secondary | ICD-10-CM

## 2021-07-30 DIAGNOSIS — Z85828 Personal history of other malignant neoplasm of skin: Secondary | ICD-10-CM | POA: Diagnosis not present

## 2021-07-30 DIAGNOSIS — R2242 Localized swelling, mass and lump, left lower limb: Secondary | ICD-10-CM | POA: Diagnosis present

## 2021-07-30 DIAGNOSIS — I10 Essential (primary) hypertension: Secondary | ICD-10-CM | POA: Diagnosis not present

## 2021-07-30 DIAGNOSIS — Z7901 Long term (current) use of anticoagulants: Secondary | ICD-10-CM | POA: Insufficient documentation

## 2021-07-30 DIAGNOSIS — I82409 Acute embolism and thrombosis of unspecified deep veins of unspecified lower extremity: Secondary | ICD-10-CM | POA: Diagnosis present

## 2021-07-30 DIAGNOSIS — I82412 Acute embolism and thrombosis of left femoral vein: Principal | ICD-10-CM | POA: Insufficient documentation

## 2021-07-30 LAB — COMPREHENSIVE METABOLIC PANEL
ALT: 11 IU/L (ref 0–32)
AST: 21 IU/L (ref 0–40)
Albumin/Globulin Ratio: 1.2 (ref 1.2–2.2)
Albumin: 3.6 g/dL (ref 3.6–4.6)
Alkaline Phosphatase: 127 IU/L — ABNORMAL HIGH (ref 44–121)
BUN/Creatinine Ratio: 16 (ref 12–28)
BUN: 10 mg/dL (ref 8–27)
Bilirubin Total: 0.3 mg/dL (ref 0.0–1.2)
CO2: 23 mmol/L (ref 20–29)
Calcium: 9.4 mg/dL (ref 8.7–10.3)
Chloride: 93 mmol/L — ABNORMAL LOW (ref 96–106)
Creatinine, Ser: 0.63 mg/dL (ref 0.57–1.00)
Globulin, Total: 2.9 g/dL (ref 1.5–4.5)
Glucose: 79 mg/dL (ref 70–99)
Potassium: 4.6 mmol/L (ref 3.5–5.2)
Sodium: 137 mmol/L (ref 134–144)
Total Protein: 6.5 g/dL (ref 6.0–8.5)
eGFR: 88 mL/min/{1.73_m2} (ref >59)

## 2021-07-30 LAB — CBC WITH DIFFERENTIAL/PLATELET
Basophils Absolute: 0.1 10*3/uL (ref 0.0–0.2)
Basos: 1 %
EOS (ABSOLUTE): 0.3 10*3/uL (ref 0.0–0.4)
Eos: 3 %
Hematocrit: 35.2 % (ref 34.0–46.6)
Hemoglobin: 11.4 g/dL (ref 11.1–15.9)
Immature Grans (Abs): 0 10*3/uL (ref 0.0–0.1)
Immature Granulocytes: 0 %
Lymphocytes Absolute: 1.4 10*3/uL (ref 0.7–3.1)
Lymphs: 17 %
MCH: 27.9 pg (ref 26.6–33.0)
MCHC: 32.4 g/dL (ref 31.5–35.7)
MCV: 86 fL (ref 79–97)
Monocytes Absolute: 0.7 10*3/uL (ref 0.1–0.9)
Monocytes: 8 %
Neutrophils Absolute: 6.1 10*3/uL (ref 1.4–7.0)
Neutrophils: 71 %
Platelets: 415 10*3/uL (ref 150–450)
RBC: 4.08 x10E6/uL (ref 3.77–5.28)
RDW: 14.5 % (ref 11.7–15.4)
WBC: 8.6 10*3/uL (ref 3.4–10.8)

## 2021-07-30 LAB — T4, FREE: Free T4: 1.78 ng/dL — ABNORMAL HIGH (ref 0.82–1.77)

## 2021-07-30 LAB — TSH: TSH: 2.02 u[IU]/mL (ref 0.450–4.500)

## 2021-07-30 LAB — T3: T3, Total: 63 ng/dL — ABNORMAL LOW (ref 71–180)

## 2021-07-30 MED ORDER — RIVAROXABAN (XARELTO) VTE STARTER PACK (15 & 20 MG)
ORAL_TABLET | ORAL | 0 refills | Status: DC
Start: 2021-07-30 — End: 2021-08-11

## 2021-07-30 MED ORDER — RIVAROXABAN 15 MG PO TABS
15.0000 mg | ORAL_TABLET | Freq: Once | ORAL | Status: AC
  Administered 2021-07-30: 15 mg via ORAL
  Filled 2021-07-30: qty 1

## 2021-07-30 MED ORDER — RIVAROXABAN (XARELTO) EDUCATION KIT FOR DVT/PE PATIENTS
PACK | Freq: Once | Status: AC
  Filled 2021-07-30: qty 1

## 2021-07-30 NOTE — ED Provider Notes (Addendum)
[facility] EMERGENCY DEPARTMENT Provider Note   CSN: [id number] Arrival date & time: 07/30/21  1752     History Chief Complaint  Patient presents with   Leg Swelling    Caroline [name] is a 85 y.o. female.  Patient is a 85 year old female with a history of hypertension, hyperlipidemia, prior DVT and recent stroke who presents with left leg swelling and pain.  She is noted some swelling in her left leg.  She has been relatively immobile since the recent stroke in September.  She is currently on Plavix and aspirin.  She went to her PCP yesterday and due to the swelling in the leg and ultrasound was ordered.  This was done today.  There was DVT present up to the common femoral vein.  She has no chest pain.  No shortness of breath.  No hypoxia.  She went back to her PCPs office where she and her daughter were told to come to the emergency room.  They were told that the patient needs to be on a heparin drip and is not a good candidate for oral therapy to be started today.      Past Medical History:  Diagnosis Date   Anxiety    Arthritis    Celiac sprue    CVA (cerebral vascular accident) ([place]) 02/2018   R thalamic CVA   GERD (gastroesophageal reflux disease)    Glaucoma    Hormone replacement therapy (HRT)    Hyperlipidemia    Hypertension    Hypoglycemia    Hypothyroidism    OAB (overactive bladder)    Osteoporosis    PONV (postoperative nausea and vomiting)    Raynaud's disease    Scoliosis    Seasonal allergies    Senile purpura ([place])    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 Cheek   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   Bridge Left Nose, and Left Neck   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 Cheek Tx: curret x 3 and 5FU   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   Bridge of nose tx Cx3 and 5FU, Left neck Tx Cx3 and 5FU   Thrombophlebitis     Patient Active Problem List   Diagnosis Date  Noted   DVT (deep venous thrombosis) ([place]) 07/30/2021   Dysphagia, post-stroke    Hypoalbuminemia due to protein-calorie malnutrition (HCC)    Benign essential HTN    Dyslipidemia    Delirium due to multiple etiologies 06/17/2021   S/P repair of paraesophageal hernia 06/15/2021   CVA (cerebral vascular accident) ([place]) 02/09/2018   Hypothyroidism 02/08/2018   Essential hypertension 02/08/2018   Acute ischemic stroke ([place]) 02/08/2018   Acute lower UTI 02/08/2018   Acute cystitis without hematuria    Right thalamic stroke ([place])    Hyperlipidemia     Past Surgical History:  Procedure Laterality Date   ABDOMINAL HYSTERECTOMY  1979   CATARACT EXTRACTION, BILATERAL     knee surgery Left    VARICOSE VEIN SURGERY Left    XI ROBOTIC ASSISTED PARAESOPHAGEAL HERNIA REPAIR N/A 06/15/2021   Procedure: XI ROBOTIC ASSISTED PARAESOPHAGEAL HERNIA REPAIR, GASTROPEXY, UPPER ENDOSCOPY;  Surgeon: Clovis Riley, MD;  Location: [facility];  Service: General;  Laterality: N/A;     OB History   No obstetric history on file.     Family History  Problem Relation Age of Onset   Arthritis Mother  pt denies   Hypertension Mother        pt denies   Obesity Sister    Cancer Maternal Uncle        type unknown   Colon cancer Neg Hx    Stomach cancer Neg Hx    Esophageal cancer Neg Hx    Pancreatic cancer Neg Hx     Social History   Tobacco Use   Smoking status: Never   Smokeless tobacco: Never  Vaping Use   Vaping Use: Never used  Substance Use Topics   Alcohol use: No   Drug use: Not Currently    Home Medications Prior to Admission medications   Medication Sig Start Date End Date Taking? Authorizing Provider  RIVAROXABAN Alveda Reasons) VTE STARTER PACK (15 & 20 MG) Follow package directions: Take one 67m tablet by mouth twice a day. On day 22, switch to one 258mtablet once a day. Take with food. 07/30/21  Yes BeMalvin JohnsMD  acetaminophen (TYLENOL) 325 MG tablet Take 2 tablets  (650 mg total) by mouth every 6 (six) hours as needed for mild pain, moderate pain, fever or headache. 07/02/21   Angiulli, DaLavon PaganiniPA-C  Ascorbic Acid (VITAMIN C) 100 MG tablet Take 1 tablet (100 mg total) by mouth every other day. 07/02/21   Angiulli, DaLavon PaganiniPA-C  atorvastatin (LIPITOR) 80 MG tablet Take 1 tablet (80 mg total) by mouth at bedtime. 07/02/21   Angiulli, DaLavon PaganiniPA-C  Cholecalciferol (VITAMIN D-3) 25 MCG (1000 UT) CAPS Take 1 capsule (1,000 Units total) by mouth daily. 07/02/21   Angiulli, DaLavon PaganiniPA-C  Coenzyme Q10 (COQ10 PO) Take 1 tablet by mouth daily.    [provider]  docusate sodium (COLACE) 100 MG capsule Take 2 capsules (200 mg total) by mouth 2 (two) times daily. 07/02/21   Angiulli, DaLavon PaganiniPA-C  furosemide (LASIX) 20 MG tablet Take 1 tablet (20 mg total) by mouth daily as needed for edema. 07/28/21   AbLorrene ReidPA-C  irbesartan (AVAPRO) 75 MG tablet Take 1 tablet (75 mg total) by mouth daily. 07/07/21   Angiulli, DaLavon PaganiniPA-C  levothyroxine (SYNTHROID) 125 MCG tablet Take 1 tablet (125 mcg total) by mouth daily at 6 (six) AM. 07/02/21   Angiulli, DaLavon PaganiniPA-C  methylPREDNISolone (MEDROL DOSEPAK) 4 MG TBPK tablet Take as directed on package. 07/28/21   AbLorrene ReidPA-C  Omega-3 Fatty Acids (FISH OIL PO) Take 1,000 mg by mouth daily.    [provider]  pantoprazole (PROTONIX) 40 MG tablet Take 1 tablet (40 mg total) by mouth 2 (two) times daily before a meal. 07/02/21   Angiulli, DaLavon PaganiniPA-C  Potassium 99 MG TABS Take 99 mg by mouth daily.    [provider]  QUEtiapine (SEROQUEL) 25 MG tablet Take 1 tablet (25 mg total) by mouth at bedtime. 07/02/21   Angiulli, DaLavon PaganiniPA-C  vitamin B-12 (CYANOCOBALAMIN) 250 MCG tablet Take 1 tablet (250 mcg total) by mouth daily. 07/02/21   Angiulli, DaLavon PaganiniPA-C    Allergies    Gluten meal, Wheat bran, Adhesive [tape], Codeine, Other, and Penicillins  Review of Systems   Review of  Systems  Constitutional:  Negative for chills, diaphoresis, fatigue and fever.  HENT:  Negative for congestion, rhinorrhea and sneezing.   Eyes: Negative.   Respiratory:  Negative for cough, chest tightness and shortness of breath.   Cardiovascular:  Positive for leg swelling. Negative for chest pain.  Gastrointestinal:  Negative for abdominal pain, blood in stool, diarrhea, nausea and vomiting.  Genitourinary:  Negative for difficulty urinating, flank pain, frequency and hematuria.  Musculoskeletal:  Negative for arthralgias and back pain.  Skin:  Negative for rash.  Neurological:  Negative for dizziness, speech difficulty, weakness, numbness and headaches.   Physical Exam Updated Vital Signs BP (!) 146/59   Pulse 83   Temp 98.7 F (37.1 C) (Oral)   Resp 17   Ht _0  (1.626 m)   Wt 63.5 kg   SpO2 95%   BMI 24.03 kg/m   Physical Exam Constitutional:      Appearance: She is well-developed.  HENT:     Head: Normocephalic and atraumatic.  Eyes:     Pupils: Pupils are equal, round, and reactive to light.  Cardiovascular:     Rate and Rhythm: Normal rate and regular rhythm.     Heart sounds: Normal heart sounds.  Pulmonary:     Effort: Pulmonary effort is normal. No respiratory distress.     Breath sounds: Normal breath sounds. No wheezing or rales.  Chest:     Chest wall: No tenderness.  Abdominal:     General: Bowel sounds are normal.     Palpations: Abdomen is soft.     Tenderness: There is no abdominal tenderness. There is no guarding or rebound.  Musculoskeletal:        General: Normal range of motion.     Cervical back: Normal range of motion and neck supple.     Comments: Swelling to left leg as compared to right.  No warmth or significant erythema.  No discoloration.  Pedal pulses intact.  Lymphadenopathy:     Cervical: No cervical adenopathy.  Skin:    General: Skin is warm and dry.     Findings: No rash.  Neurological:     Mental Status: She is alert.      Comments: Patient is alert and oriented.  She has some difficulty answering questions.  She moves all extremities symmetrically without obvious focal deficits.    ED Results / Procedures / Treatments   Labs (all labs ordered are listed, but only abnormal results are displayed) Labs Reviewed - No data to display  EKG None  Radiology VAS [place] LOWER EXTREMITY VENOUS (DVT)  Result Date: 07/30/2021  Lower Venous DVT Study Patient Name:  LEILIANA FOODY  Date of Exam:   07/30/2021 Medical Rec #: [id number]   Accession #:    [id number] Date of Birth: [birth year]   Patient Gender: F Patient Age:   28 years Exam Location:  [facility] Procedure:      VAS [place] LOWER EXTREMITY VENOUS (DVT) Referring Phys: Lorrene Reid --------------------------------------------------------------------------------  Indications: Pain, and Swelling.  Comparison Study: 06/04/2010: Lt- Superficial thrombophlebitis involving                   varicosities in proximal left thigh. Performing Technologist: Ivan Croft  Examination Guidelines: A complete evaluation includes B-mode imaging, spectral Doppler, color Doppler, and power Doppler as needed of all accessible portions of each vessel. Bilateral testing is considered an integral part of a complete examination. Limited examinations for reoccurring indications may be performed as noted. The reflux portion of the exam is performed with the patient in reverse Trendelenburg.  +-----+---------------+---------+-----------+----------+--------------+ RIGHTCompressibilityPhasicitySpontaneityPropertiesThrombus Aging +-----+---------------+---------+-----------+----------+--------------+ CFV  Full           Yes      Yes                                 +-----+---------------+---------+-----------+----------+--------------+   +---------+---------------+---------+-----------+----------+--------------+  LEFT     CompressibilityPhasicitySpontaneityPropertiesThrombus  Aging +---------+---------------+---------+-----------+----------+--------------+ CFV      None           No       No                                  +---------+---------------+---------+-----------+----------+--------------+ SFJ      None                                                        +---------+---------------+---------+-----------+----------+--------------+ FV Prox  None                                                        +---------+---------------+---------+-----------+----------+--------------+ FV Mid   None           No       No                                  +---------+---------------+---------+-----------+----------+--------------+ FV DistalNone                                                        +---------+---------------+---------+-----------+----------+--------------+ PFV      None                                                        +---------+---------------+---------+-----------+----------+--------------+ POP      None           No       No                                  +---------+---------------+---------+-----------+----------+--------------+ PTV      Full                                                        +---------+---------------+---------+-----------+----------+--------------+ PERO     None                                                        +---------+---------------+---------+-----------+----------+--------------+ Gastroc  None                                                        +---------+---------------+---------+-----------+----------+--------------+  GSV      Full                                                        +---------+---------------+---------+-----------+----------+--------------+ SSV      Full                                                        +---------+---------------+---------+-----------+----------+--------------+    Summary: RIGHT: - No evidence of common  femoral vein obstruction.  LEFT: - Findings consistent with acute deep vein thrombosis involving the left common femoral vein, SF junction, left femoral vein, left proximal profunda vein, left popliteal vein, left peroneal veins, and left gastrocnemius veins. - There is no evidence of superficial venous thrombosis.  - No cystic structure found in the popliteal fossa. - Results were given to Lorrene Reid (07/30/2021).  *See table(s) above for measurements and observations.    Preliminary     Procedures Procedures   Medications Ordered in ED Medications  rivaroxaban Alveda Reasons) Education Kit for DVT/PE patients ( Does not apply Given by Other 07/30/21 2207)  Rivaroxaban (XARELTO) tablet 15 mg (15 mg Oral Given 07/30/21 2131)    ED Course  I have reviewed the triage vital signs and the nursing notes.  Pertinent labs & imaging results that were available during my care of the patient were reviewed by me and considered in my medical decision making (see chart for details).    MDM Rules/Calculators/A&P                           Patient is a 85 year old female who [place] with a DVT to her left leg.  There was concern that she needs to be admitted for heparin.  She does not have any suggestions of PE.  No hypoxia.  No chest pain or shortness of breath.  I reviewed her labs from yesterday.  She has a normal creatinine.  I spoke with Dr. Virl Cagey with vascular surgery.  He reviewed the DVT and said that she would not benefit from clot retrieval.  He felt that it is appropriate for her to be treated as an outpatient with an oral anticoagulant such as Xarelto.  He did not feel that there would be any improved outcome/benefit starting heparin.  I also spoke with Dr. Leonel Ramsay with neurology.  He recommends having the patient's stop the Plavix and start a baby aspirin.  They have an appointment next week with her neurologist.  At that point, they can discuss whether the patient needs to continue a baby  aspirin.  I discussed all this with the patient and the patient's daughter.  They are good with the plan.  The pharmacist came by to discuss the [place].  She was discharged home in good condition.  She was started on Xarelto.  She was encouraged to follow-up with her PCP and neurologist.  Return precautions were given. Final Clinical Impression(s) / ED Diagnoses Final diagnoses:  Acute deep vein thrombosis (DVT) of femoral vein of left lower extremity ([place])    Rx / DC Orders ED Discharge Orders  Ordered    RIVAROXABAN (XARELTO) VTE STARTER PACK (15 & 20 MG)        07/30/21 2211             Malvin Johns, MD 07/30/21 2301    Malvin Johns, MD 07/31/21 0006

## 2021-07-30 NOTE — Discharge Instructions (Addendum)
Start taking the Xarelto as directed.  Please stop your Plavix but start taking a baby aspirin.  When you follow-up with your neurologist next week, discuss whether you need to continue on the baby aspirin.  Follow-up with your primary care doctor next week to reassess the DVT.  Return to emergency room if you have any worsening symptoms including worsening pain or redness to the leg, chest pain or shortness of breath, dizziness or other worsening symptoms.  --------------------------------------------------------------------------------------------------------------------------------------------------------------------------------- Information on my medicine - XARELTO (rivaroxaban)  This medication education was reviewed with me or my healthcare representative as part of my discharge preparation.   WHY WAS XARELTO PRESCRIBED FOR YOU? Xarelto was prescribed to treat blood clots that may have been found in the veins of your legs (deep vein thrombosis) or in your lungs (pulmonary embolism) and to reduce the risk of them occurring again.  What do you need to know about Xarelto? The starting dose is one 15 mg tablet taken TWICE daily with food for the FIRST 21 DAYS then on (enter date)  08/21/2021  the dose is changed to one 20 mg tablet taken ONCE A DAY with your evening meal.  DO NOT stop taking Xarelto without talking to the health care provider who prescribed the medication.  Refill your prescription for 20 mg tablets before you run out.  After discharge, you should have regular check-up appointments with your healthcare provider that is prescribing your Xarelto.  In the future your dose may need to be changed if your kidney function changes by a significant amount.  What do you do if you miss a dose? If you are taking Xarelto TWICE DAILY and you miss a dose, take it as soon as you remember. You may take two 15 mg tablets (total 30 mg) at the same time then resume your regularly scheduled 15  mg twice daily the next day.  If you are taking Xarelto ONCE DAILY and you miss a dose, take it as soon as you remember on the same day then continue your regularly scheduled once daily regimen the next day. Do not take two doses of Xarelto at the same time.   Important Safety Information Xarelto is a blood thinner medicine that can cause bleeding. You should call your healthcare provider right away if you experience any of the following: Bleeding from an injury or your nose that does not stop. Unusual colored urine (red or dark brown) or unusual colored stools (red or black). Unusual bruising for unknown reasons. A serious fall or if you hit your head (even if there is no bleeding).  Some medicines may interact with Xarelto and might increase your risk of bleeding while on Xarelto. To help avoid this, consult your healthcare provider or pharmacist prior to using any new prescription or non-prescription medications, including herbals, vitamins, non-steroidal anti-inflammatory drugs (NSAIDs) and supplements.  This website has more information on Xarelto: VisitDestination.com.br.

## 2021-07-30 NOTE — ED Triage Notes (Signed)
Sent here by PMD due for management of DVTs.

## 2021-07-30 NOTE — Progress Notes (Signed)
Discussion was had with with ER provider as well as admitting hospitalist. She had a stroke with plan to conitnue asa/plavix x 21 days. She is now more than 21 days out and could be on single agent at this point. She is being started on anticoagulation and no need for combination with ASA, plavix and anticoagulation.   There is no contraindication at this point to oral anticoagulants 6 weeks out from stroke and I have no objection to starting this. Could continue ASA 81mg  daily for now with plan to call her neurologist to see if she should discontinue this as well.   This note reflects multiple conversations totaling > 2.5 minutes and chart review > 5 minutes.   , MD [facility] [phone]  If 7pm- 7am, please page neurology on call as listed in [facility].

## 2021-08-03 ENCOUNTER — Telehealth: Payer: Self-pay | Admitting: Physician Assistant

## 2021-08-03 NOTE — Telephone Encounter (Signed)
Nino Glow, PT with [facility] is calling requesting verbal authorization for patient to have nursing evaluation and treatment.   Her call back # is [phone]

## 2021-08-03 NOTE — Telephone Encounter (Signed)
Called PT back to give verbal authorization for her to evaluate and treat patient.

## 2021-08-04 ENCOUNTER — Encounter: Payer: Self-pay | Admitting: Physician Assistant

## 2021-08-04 ENCOUNTER — Ambulatory Visit (INDEPENDENT_AMBULATORY_CARE_PROVIDER_SITE_OTHER): Payer: Medicare Other | Admitting: Physician Assistant

## 2021-08-04 ENCOUNTER — Other Ambulatory Visit: Payer: Self-pay

## 2021-08-04 VITALS — BP 122/64 | HR 97 | Temp 98.2°F

## 2021-08-04 DIAGNOSIS — I82412 Acute embolism and thrombosis of left femoral vein: Secondary | ICD-10-CM

## 2021-08-04 DIAGNOSIS — E039 Hypothyroidism, unspecified: Secondary | ICD-10-CM | POA: Diagnosis not present

## 2021-08-04 DIAGNOSIS — E785 Hyperlipidemia, unspecified: Secondary | ICD-10-CM | POA: Diagnosis not present

## 2021-08-04 DIAGNOSIS — R32 Unspecified urinary incontinence: Secondary | ICD-10-CM | POA: Diagnosis not present

## 2021-08-04 DIAGNOSIS — Z23 Encounter for immunization: Secondary | ICD-10-CM | POA: Diagnosis not present

## 2021-08-04 MED ORDER — QUETIAPINE FUMARATE 25 MG PO TABS: 25.0000 mg | ORAL_TABLET | Freq: Every day | ORAL | 1 refills | Status: DC

## 2021-08-04 MED ORDER — CATHETERS MISC: 3 refills | Status: DC

## 2021-08-04 MED ORDER — ATORVASTATIN CALCIUM 80 MG PO TABS: 80.0000 mg | ORAL_TABLET | Freq: Every day | ORAL | 1 refills | Status: DC

## 2021-08-04 MED ORDER — LEVOTHYROXINE SODIUM 125 MCG PO TABS: 125.0000 ug | ORAL_TABLET | Freq: Every day | ORAL | 0 refills | Status: DC

## 2021-08-04 NOTE — Patient Instructions (Signed)
Deep Vein Thrombosis Deep vein thrombosis (DVT) is a condition in which a blood clot forms in a deep vein, such as a vein in the lower leg, thigh, pelvis, or arm. Deep veins are veins in the deep venous system. A clot is blood that has thickened into a gel or solid. This condition is serious and can be life-threatening if the clot travels to the lungs and causes a blockage (pulmonary embolism) in the arteries of the lung. A DVT can also damage veins in the leg. This can lead to long-term, or chronic, venous disease, leg pain, swelling, discoloration, and ulcers or sores (post-thrombotic syndrome). What are the causes? This condition may be caused by: A slowdown of blood flow. Damage to a vein. A condition that causes blood to clot more easily, such as certain blood-clotting disorders. What increases the risk? The following factors may make you more likely to develop this condition: Having obesity. Being older, especially older than age 60. Being inactive (sedentary lifestyle) or not moving around. This may include: Sitting or lying down for longer than 4-6 hours other than to sleep at night. Being in the hospital, having major or lengthy surgery, or having a thin, flexible tube (central line catheter) placed in a large vein. Being pregnant, giving birth, or having recently given birth. Taking medicines that contain estrogen, such as birth control or hormone replacement therapy. Using products that contain nicotine or tobacco, especially if you use hormonal birth control. Having a history of blood clots or a blood-clotting disease, a blood vessel disease (peripheral vascular disease), or congestive heart disease. Having a history of cancer, especially if being treated with chemotherapy. What are the signs or symptoms? Symptoms of this condition include: Swelling, pain, pressure, or tenderness in an arm or a leg. An arm or a leg becoming warm, red, or discolored. A leg turning very pale. You may  have a large DVT. This is rare. If the clot is in your leg, you may notice symptoms more or have worse symptoms when you stand or walk. In some cases, there are no symptoms. How is this diagnosed? This condition is diagnosed with: Your medical history and a physical exam. Tests, such as: Blood tests to check how well your blood clots. Doppler ultrasound. This is the best way to find a DVT. Venogram. Contrast dye is injected into a vein, and X-rays are taken to check for clots. How is this treated? Treatment for this condition depends on: The cause of your DVT. The size and location of your DVT, or having more than one DVT. Your risk for bleeding or developing more clots. Other medical conditions you may have. Treatment may include: Taking a blood thinner, also called an anticoagulant, to prevent clots from forming and growing. Wearing compression stockings, if directed. Injecting medicines into the affected vein to break up the clot (catheter-directed thrombolysis). This is used only for severe DVT and only if a specialist recommends it. Specific surgical procedures, when DVT is severe or hard to treat. These may be done to: Isolate and remove your clot. Place an inferior vena cava (IVC) filter in a large vein to catch blood clots before they reach your lungs. You may get some medical treatments for 6 months or longer. Follow these instructions at home: If you are taking blood thinners: Talk with your health care provider before you take any medicines that contain aspirin or NSAIDs, such as ibuprofen. These medicines increase your risk for dangerous bleeding. Take your medicine exactly as   told, at the same time every day. Do not skip a dose. Do not take more than the prescribed dose. This is important. Ask your health care provider about foods and medicines that could change the way your blood thinner works (may interact). Avoid these foods and medicines if you are told to do so. Avoid  anything that may cause bleeding or bruising. You may bleed more easily while taking blood thinners. Be very careful when using knives, scissors, or other sharp objects. Use an electric razor instead of a blade. Avoid activities that could cause injury or bruising, and follow instructions for preventing falls. Tell your health care provider if you have had any internal bleeding, bleeding ulcers, or neurologic diseases, such as strokes or cerebral aneurysms. Wear a medical alert bracelet or carry a card that lists what medicines you take. General instructions Take over-the-counter and prescription medicines only as told by your health care provider. Return to your normal activities as told by your health care provider. Ask your health care provider what activities are safe for you. If recommended, wear compression stockings as told by your health care provider. These stockings help to prevent blood clots and reduce swelling in your legs. Keep all follow-up visits as told by your health care provider. This is important. Contact a health care provider if: You miss a dose of your blood thinner. You have new or worse pain, swelling, or redness in an arm or a leg. You have worsening numbness or tingling in an arm or a leg. You have unusual bruising. Get help right away if: You have signs or symptoms that a blood clot has moved to the lungs. These may include: Shortness of breath. Chest pain. Fast or irregular heartbeats (palpitations). Light-headedness or dizziness. Coughing up blood. You have signs or symptoms that your blood is too thin. These may include: Blood in your vomit, stool, or urine. A cut that will not stop bleeding. A menstrual period that is heavier than usual. A severe headache or confusion. These symptoms may represent a serious problem that is an emergency. Do not wait to see if the symptoms will go away. Get medical help right away. Call your local emergency services (911 in  the [place] Do not drive yourself to the hospital. Summary Deep vein thrombosis (DVT) happens when a blood clot forms in a deep vein. This may occur in the lower leg, thigh, pelvis, or arm. Symptoms affect the arm or leg and can include swelling, pain, tenderness, warmth, redness, or discoloration. This condition may be treated with medicines or compression stockings. In severe cases, surgery may be done. If you are taking blood thinners, take them exactly as told. Do not skip a dose. Do not take more than is prescribed. Get help right away if you have shortness of breath, chest pain, fast or irregular heartbeats, or blood in your vomit, urine, or stool. This information is not intended to replace advice given to you by your health care provider. Make sure you discuss any questions you have with your health care provider. Document Revised: 09/14/2019 Document Reviewed: 09/15/2019 Elsevier Patient Education  2022 Elsevier Inc.  

## 2021-08-04 NOTE — Progress Notes (Signed)
Established Patient Office Visit  Subjective:  Patient ID: Caroline [name], female    DOB: June 14, 1936  Age: 85 y.o. MRN: [id number]  CC:  Chief Complaint  Patient presents with   Hospitalization Follow-up    HPI Caroline [name] presents for ED follow up and medication refills. Patient is accompanied by her daughter, husband and caregiver. Patient was started on Xarelto for left lower extremity DVT. Patient reports left lower extremity swelling has improved. Has been more compliant with compression socks. Only has taken one dose of Lasix. Patient has not been complaining of right knee pain so has not started steroid taper. Patient was advised to hold Plavix and start baby aspirin, has an appointment with her neurologist tomorrow. Family also requesting medication refills. Patient's caregiver reports patient uses purewick especially for night time and requesting rx for inserts.     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 Cheek   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   Bridge Left Nose, and Left Neck   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 Cheek Tx: curret x 3 and 5FU   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   Bridge of nose tx Cx3 and 5FU, Left neck Tx Cx3 and 5FU   Thrombophlebitis     Past Surgical History:  Procedure Laterality Date   ABDOMINAL HYSTERECTOMY  1979   CATARACT EXTRACTION, BILATERAL     knee surgery Left    VARICOSE VEIN SURGERY  Left    XI ROBOTIC ASSISTED PARAESOPHAGEAL HERNIA REPAIR N/A 06/15/2021   Procedure: XI ROBOTIC ASSISTED PARAESOPHAGEAL HERNIA REPAIR, GASTROPEXY, UPPER ENDOSCOPY;  Surgeon: Clovis Riley, MD;  Location: [facility];  Service: General;  Laterality: N/A;    Family History  Problem Relation Age of Onset   Arthritis Mother        pt denies   Hypertension Mother        pt denies   Obesity Sister    Cancer Maternal Uncle        type unknown   Colon cancer Neg Hx    Stomach cancer Neg Hx    Esophageal cancer Neg Hx    Pancreatic cancer Neg Hx     Social History   Socioeconomic History   Marital status: Married    Spouse name: [name]   Number of children: 3   Years of education: 12   Highest education level: Not on file  Occupational History   Occupation: retired  Tobacco Use   Smoking status: Never   Smokeless tobacco: Never  Vaping Use   Vaping Use: Never used  Substance and Sexual Activity   Alcohol use: No   Drug use: Not Currently   Sexual activity: Never  Other Topics Concern   Not on file  Social History Narrative   Lives with husband   Right Handed  Drinks no caffeine   Social Determinants of Radio broadcast assistant Strain: Not on file  Food Insecurity: Not on file  Transportation Needs: Not on file  Physical Activity: Not on file  Stress: Not on file  Social Connections: Not on file  Intimate Partner Violence: Not on file    Outpatient Medications Prior to Visit  Medication Sig Dispense Refill   acetaminophen (TYLENOL) 325 MG tablet Take 2 tablets (650 mg total) by mouth every 6 (six) hours as needed for mild pain, moderate pain, fever or headache.     Ascorbic Acid (VITAMIN C) 100 MG tablet Take 1 tablet (100 mg total) by mouth every other day. 30 tablet 0   Cholecalciferol (VITAMIN D-3) 25 MCG (1000 UT) CAPS Take 1 capsule (1,000 Units total) by mouth daily. 30 capsule 0   Coenzyme Q10 (COQ10 PO) Take 1 tablet by mouth daily.     docusate sodium  (COLACE) 100 MG capsule Take 2 capsules (200 mg total) by mouth 2 (two) times daily. 10 capsule 0   furosemide (LASIX) 20 MG tablet Take 1 tablet (20 mg total) by mouth daily as needed for edema. 90 tablet 0   irbesartan (AVAPRO) 75 MG tablet Take 1 tablet (75 mg total) by mouth daily. 30 tablet 0   methylPREDNISolone (MEDROL DOSEPAK) 4 MG TBPK tablet Take as directed on package. 21 tablet 0   Omega-3 Fatty Acids (FISH OIL PO) Take 1,000 mg by mouth daily.     pantoprazole (PROTONIX) 40 MG tablet Take 1 tablet (40 mg total) by mouth 2 (two) times daily before a meal. 60 tablet 0   Potassium 99 MG TABS Take 99 mg by mouth daily.     RIVAROXABAN (XARELTO) VTE STARTER PACK (15 & 20 MG) Follow package directions: Take one 58m tablet by mouth twice a day. On day 22, switch to one 232mtablet once a day. Take with food. 51 each 0   vitamin B-12 (CYANOCOBALAMIN) 250 MCG tablet Take 1 tablet (250 mcg total) by mouth daily. 30 tablet 0   atorvastatin (LIPITOR) 80 MG tablet Take 1 tablet (80 mg total) by mouth at bedtime. 30 tablet 0   levothyroxine (SYNTHROID) 125 MCG tablet Take 1 tablet (125 mcg total) by mouth daily at 6 (six) AM. 30 tablet 0   QUEtiapine (SEROQUEL) 25 MG tablet Take 1 tablet (25 mg total) by mouth at bedtime. 30 tablet 0   No facility-administered medications prior to visit.    Allergies  Allergen Reactions   Gluten Meal Nausea And Vomiting    Patient has CELIAC DISEASE   Wheat Bran Nausea And Vomiting    Patient has CELIAC DISEASE   Adhesive [Tape] Other (See Comments)    Patient's skin is VERY THIN and tears very easily; please use either paper tape or Coban wrap   Codeine Nausea And Vomiting   Other Other (See Comments)    Patient has Hypoglycemia; her husband stated "when her sugar crashes, it crashes hard."   Penicillins Other (See Comments)    Doesn't remember reaction , but denies any SOB/Swelling States " it was a long time ago "     ROS Review of Systems Review  of Systems:  A fourteen system review of systems was performed and found to be positive as per HPI.   Objective:    Physical Exam General:  Pleasant and cooperative, in no acute distress  Neuro:  Alert, at baseline, extra-ocular muscles intact  HEENT:  Normocephalic,  atraumatic, neck supple Skin:  no gross rash, warm, dry. Cardiac:  RRR Respiratory:  CTA B/L, Not using accessory muscles, speaking in full sentences- unlabored. Vascular:  Ext warm, no cyanosis apprec.; +LLE edema (wearing compression socks) Psych:  No HI/SI, Euthymic mood. Full Affect.  BP 122/64   Pulse 97   Temp 98.2 F (36.8 C)   SpO2 96%  Wt Readings from Last 3 Encounters:  07/30/21 140 lb (63.5 kg)  07/28/21 143 lb 1.9 oz (64.9 kg)  07/21/21 140 lb 12.8 oz (63.9 kg)     Health Maintenance Due  Topic Date Due   Pneumonia Vaccine 34+ Years old (1 - PCV) Never done   TETANUS/TDAP  Never done   Zoster Vaccines- Shingrix (1 of 2) Never done   DEXA SCAN  Never done   COVID-19 Vaccine (4 - Booster for Pfizer series) 11/10/2020    There are no preventive care reminders to display for this patient.  Lab Results  Component Value Date   TSH 2.020 07/29/2021   Lab Results  Component Value Date   WBC 8.6 07/29/2021   HGB 11.4 07/29/2021   HCT 35.2 07/29/2021   MCV 86 07/29/2021   PLT 415 07/29/2021   Lab Results  Component Value Date   NA 137 07/29/2021   K 4.6 07/29/2021   CO2 23 07/29/2021   GLUCOSE 79 07/29/2021   BUN 10 07/29/2021   CREATININE 0.63 07/29/2021   BILITOT 0.3 07/29/2021   ALKPHOS 127 (H) 07/29/2021   AST 21 07/29/2021   ALT 11 07/29/2021   PROT 6.5 07/29/2021   ALBUMIN 3.6 07/29/2021   CALCIUM 9.4 07/29/2021   ANIONGAP 11 07/17/2021   EGFR 88 07/29/2021   Lab Results  Component Value Date   CHOL 216 (H) 06/19/2021   Lab Results  Component Value Date   HDL 75 06/19/2021   Lab Results  Component Value Date   LDLCALC 123 (H) 06/19/2021   Lab Results  Component  Value Date   TRIG 88 06/19/2021   Lab Results  Component Value Date   CHOLHDL 2.9 06/19/2021   Lab Results  Component Value Date   HGBA1C 5.5 06/19/2021      Assessment & Plan:   Problem List Items Addressed This Visit       Cardiovascular and Mediastinum   DVT (deep venous thrombosis) (HCC)   Relevant Medications   atorvastatin (LIPITOR) 80 MG tablet     Endocrine   Hypothyroidism - Primary   Relevant Medications   levothyroxine (SYNTHROID) 125 MCG tablet     Other   Dyslipidemia   Relevant Medications   atorvastatin (LIPITOR) 80 MG tablet   Other Visit Diagnoses     Urinary incontinence, unspecified type       Relevant Medications   Catheters MISC   Need for influenza vaccination       Relevant Orders   Flu Vaccine QUAD High Dose(Fluad) (Completed)      DVT: -07/30/2021 VAS [place] Lower Extremity: Summary:  RIGHT: No evidence of common femoral vein obstruction.    LEFT: - Findings consistent with acute deep vein thrombosis involving the left              common femoral vein, SF junction, left femoral vein, left proximal            profunda vein, left popliteal vein, left peroneal veins, and left gastrocnemius veins.          -There is no evidence of  superficial venous thrombosis.         - No cystic structure found in the popliteal fossa.  -Recommend to continue Xarelto as directed and once patient starts taking daily dose of 20 mg recommend to contact the office for refill. Discussed with patient and family maintaining on anticoagulant therapy x 3-6 months, however patient does have hx of prior DVT and risk factors for recurrence and could potentially benefit from long-term anticoagulant, will await Neurology recommendation. Continue compression socks and aspirin 81 mg.  Hypothyroidism: -Discussed recent thyroid labs, will repeat thyroid labs in 6 weeks. Will continue current medication regimen, provided refill of levothyroxine 125 mcg.   Dyslipidemia: -Recent  ALT 11, continue current medication regimen. Provided refill. -Will continue to monitor.  Urinary incontinence: -If unable to get catheter supplies recommend to reach out to medical supply company.   Meds ordered this encounter  Medications   atorvastatin (LIPITOR) 80 MG tablet    Sig: Take 1 tablet (80 mg total) by mouth at bedtime.    Dispense:  90 tablet    Refill:  1   levothyroxine (SYNTHROID) 125 MCG tablet    Sig: Take 1 tablet (125 mcg total) by mouth daily at 6 (six) AM.    Dispense:  90 tablet    Refill:  0   QUEtiapine (SEROQUEL) 25 MG tablet    Sig: Take 1 tablet (25 mg total) by mouth at bedtime.    Dispense:  90 tablet    Refill:  1   Catheters MISC    Sig: Use as directed. 1 box of inserts and 1 box canisters.    Dispense:  1 each    Refill:  3    Order Specific Question:   Supervising Provider    Answer:   Beatrice Lecher D [[id number]]    Follow-up: Return in about 6 weeks (around 09/15/2021) for HTN, DVT, HLD, thyroid (40 min).    Caroline Reid, PA-C

## 2021-08-05 ENCOUNTER — Ambulatory Visit (INDEPENDENT_AMBULATORY_CARE_PROVIDER_SITE_OTHER): Payer: Medicare Other | Admitting: Neurology

## 2021-08-05 VITALS — BP 149/64 | HR 98 | Ht 64.0 in | Wt 141.0 lb

## 2021-08-05 DIAGNOSIS — F01A0 Vascular dementia, mild, without behavioral disturbance, psychotic disturbance, mood disturbance, and anxiety: Secondary | ICD-10-CM

## 2021-08-05 DIAGNOSIS — I63532 Cerebral infarction due to unspecified occlusion or stenosis of left posterior cerebral artery: Secondary | ICD-10-CM | POA: Diagnosis not present

## 2021-08-05 NOTE — Patient Instructions (Signed)
I had a long discussion with the patient, her husband and daughter regarding her recent stroke and worsening memory and cognitive impairment which is likely due to combination of mild vascular dementia with underlying age-related cognitive impairment.  I recommend she discontinue aspirin and stay on Xarelto alone given history of DVT for stroke prevention and maintain aggressive risk factor modification with strict control of hypertension with blood pressure goal below 130/90, lipids with LDL cholesterol goal below 70 mg percent and diabetes with hemoglobin A1c goal below 6.5%.  She was encouraged to use a walker at all times and discussed fall safety precautions.  We also discussed memory compensation strategies and advised her to increase participation in cognitively challenging activities like solving crossword puzzles, playing bridge and sodoku.  She will return for follow-up in the future in 3 months with my nurse practitioner on call earlier if necessary. Fall Prevention in the Home, Adult Falls can cause injuries and can happen to people of all ages. There are many things you can do to make your home safe and to help prevent falls. Ask for help when making these changes. What actions can I take to prevent falls? General Instructions Use good lighting in all rooms. Replace any light bulbs that burn out. Turn on the lights in dark areas. Use night-lights. Keep items that you use often in easy-to-reach places. Lower the shelves around your home if needed. Set up your furniture so you have a clear path. Avoid moving your furniture around. Do not have throw rugs or other things on the floor that can make you trip. Avoid walking on wet floors. If any of your floors are uneven, fix them. Add color or contrast paint or tape to clearly mark and help you see: Grab bars or handrails. First and last steps of staircases. Where the edge of each step is. If you use a stepladder: Make sure that it is fully  opened. Do not climb a closed stepladder. Make sure the sides of the stepladder are locked in place. Ask someone to hold the stepladder while you use it. Know where your pets are when moving through your home. What can I do in the bathroom?   Keep the floor dry. Clean up any water on the floor right away. Remove soap buildup in the tub or shower. Use nonskid mats or decals on the floor of the tub or shower. Attach bath mats securely with double-sided, nonslip rug tape. If you need to sit down in the shower, use a plastic, nonslip stool. Install grab bars by the toilet and in the tub and shower. Do not use towel bars as grab bars. What can I do in the bedroom? Make sure that you have a light by your bed that is easy to reach. Do not use any sheets or blankets for your bed that hang to the floor. Have a firm chair with side arms that you can use for support when you get dressed. What can I do in the kitchen? Clean up any spills right away. If you need to reach something above you, use a step stool with a grab bar. Keep electrical cords out of the way. Do not use floor polish or wax that makes floors slippery. What can I do with my stairs? Do not leave any items on the stairs. Make sure that you have a light switch at the top and the bottom of the stairs. Make sure that there are handrails on both sides of the stairs. Fix  handrails that are broken or loose. Install nonslip stair treads on all your stairs. Avoid having throw rugs at the top or bottom of the stairs. Choose a carpet that does not hide the edge of the steps on the stairs. Check carpeting to make sure that it is firmly attached to the stairs. Fix carpet that is loose or worn. What can I do on the outside of my home? Use bright outdoor lighting. Fix the edges of walkways and driveways and fix any cracks. Remove anything that might make you trip as you walk through a door, such as a raised step or threshold. Trim any bushes or  trees on paths to your home. Check to see if handrails are loose or broken and that both sides of all steps have handrails. Install guardrails along the edges of any raised decks and porches. Clear paths of anything that can make you trip, such as tools or rocks. Have leaves, snow, or ice cleared regularly. Use sand or salt on paths during winter. Clean up any spills in your garage right away. This includes grease or oil spills. What other actions can I take? Wear shoes that: Have a low heel. Do not wear high heels. Have rubber bottoms. Feel good on your feet and fit well. Are closed at the toe. Do not wear open-toe sandals. Use tools that help you move around if needed. These include: Canes. Walkers. Scooters. Crutches. Review your medicines with your doctor. Some medicines can make you feel dizzy. This can increase your chance of falling. Ask your doctor what else you can do to help prevent falls. Where to find more information Centers for Disease Control and Prevention, [employer]: FootballExhibition.com.br [employer] on Aging: https://walker.com/ Contact a doctor if: You are afraid of falling at home. You feel weak, drowsy, or dizzy at home. You fall at home. Summary There are many simple things that you can do to make your home safe and to help prevent falls. Ways to make your home safe include removing things that can make you trip and installing grab bars in the bathroom. Ask for help when making these changes in your home. This information is not intended to replace advice given to you by your health care provider. Make sure you discuss any questions you have with your health care provider. Document Revised: 04/23/2020 Document Reviewed: 04/23/2020 Elsevier Patient Education  2022 Elsevier Inc. Memory Compensation Strategies  Use "WARM" strategy.  W= write it down  A= associate it  R= repeat it  M= make a mental note  2.   You can keep a Glass blower/designer.  Use a 3-ring notebook  with sections for the following: calendar, important names and phone numbers,  medications, doctors' names/phone numbers, lists/reminders, and a section to journal what you did  each day.   3.    Use a calendar to write appointments down.  4.    Write yourself a schedule for the day.  This can be placed on the calendar or in a separate section of the Memory Notebook.  Keeping a  regular schedule can help memory.  5.    Use medication organizer with sections for each day or morning/evening pills.  You may need help loading it  6.    Keep a basket, or pegboard by the door.  Place items that you need to take out with you in the basket or on the pegboard.  You may also want to  include a message board for reminders.  7.  Use sticky notes.  Place sticky notes with reminders in a place where the task is performed.  For example: " turn off the  stove" placed by the stove, "lock the door" placed on the door at eye level, " take your medications" on  the bathroom mirror or by the place where you normally take your medications.  8.    Use alarms/timers.  Use while cooking to remind yourself to check on food or as a reminder to take your medicine, or as a  reminder to make a call, or as a reminder to perform another task, etc.

## 2021-08-05 NOTE — Progress Notes (Signed)
[facility] [address] [place]. [place] [address] [phone]       OFFICE CONSULT NOTE  [name] Date of Birth:  [birth year] Medical Record Number:  [id number]   Referring MD: Caroline Maclachlan PA-C  Reason for Referral: Stroke  HPI: Caroline [name] is a 85 year old pleasant Caucasian lady seen for initial office consultation visit for stroke.  She is accompanied by her daughter and husband.  History is obtained from them and review of electronic medical records and I personally reviewed pertinent available imaging films in PACS.  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  She presented on 06/19/2021 on postoperative day 1 for confusion, delirium and lethargy.  This was initially felt to be related to narcotics which had been stopped started with stop.  She was also felt to have underlying low-grade dementia which was exacerbated by Caroline surgery.  Her symptoms continued for couple of days and noncontrast CT scan of Caroline head was obtained which showed acute left posterior cerebral artery stroke.  Her NIH was 4 on admission she is not a candidate for tPA due to recent surgery.  MRI scan was subsequently obtained which showed a left posterior cerebral artery infarct and MRA showed left P2 occlusion.  2D echo showed ejection fraction of 60 to 65% without cardiac source of embolism.  LDL cholesterol was elevated at 123 mg percent and hemoglobin A1c was 5.5.  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  [name] continues to have right-sided peripheral vision loss.  She has been  ambulating with a walker and has been careful.  She has had no falls or injuries.  Caroline family has noticed worsening of her cognitive difficulties with [name] having more frequent sundowning for which she has been started on Seroquel 25 mg at night which helps her sleep well.  There are no delusions or hallucinations during Caroline day.  She has more short-term memory difficulties and intermittent confusion which is new.  She does have 24-hour care at home and cannot be left alone.  ROS:   14 system review of systems is positive for memory loss, imbalance, walking difficulty, peripheral vision loss and all other systems negative  PMH:  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 Cheek   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   Bridge Left Nose, and Left Neck   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 Cheek Tx: curret x 3 and 5FU   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  Bridge of nose tx Cx3 and 5FU, Left neck Tx Cx3 and 5FU   Thrombophlebitis     Social History:  Social History   Socioeconomic History   Marital status: Married    Spouse name: [name]   Number of children: 3   Years of education: 12   Highest education level: Not on file  Occupational History   Occupation: retired  Tobacco Use   Smoking status: Never   Smokeless tobacco: Never  Scientific laboratory technician Use: Never used  Substance and Sexual Activity   Alcohol use: No   Drug use: Not  Currently   Sexual activity: Never  Other Topics Concern   Not on file  Social History Narrative   Lives with husband   Right Handed   Drinks no caffeine   Social Determinants of Health   Financial Resource Strain: Not on file  Food Insecurity: Not on file  Transportation Needs: Not on file  Physical Activity: Not on file  Stress: Not on file  Social Connections: Not on file  Intimate Partner Violence: Not on file    Medications:   Current Outpatient Medications on File Prior to Visit  Medication Sig Dispense Refill   acetaminophen (TYLENOL) 325 MG tablet Take 2 tablets (650 mg total) by mouth every 6 (six) hours as needed for mild pain, moderate pain, fever or headache.     Ascorbic Acid (VITAMIN C) 100 MG tablet Take 1 tablet (100 mg total) by mouth every other day. 30 tablet 0   atorvastatin (LIPITOR) 80 MG tablet Take 1 tablet (80 mg total) by mouth at bedtime. 90 tablet 1   Catheters MISC Use as directed. 1 box of inserts and 1 box canisters. 1 each 3   Cholecalciferol (VITAMIN D-3) 25 MCG (1000 UT) CAPS Take 1 capsule (1,000 Units total) by mouth daily. 30 capsule 0   Coenzyme Q10 (COQ10 PO) Take 1 tablet by mouth daily.     docusate sodium (COLACE) 100 MG capsule Take 2 capsules (200 mg total) by mouth 2 (two) times daily. 10 capsule 0   furosemide (LASIX) 20 MG tablet Take 1 tablet (20 mg total) by mouth daily as needed for edema. 90 tablet 0   irbesartan (AVAPRO) 75 MG tablet Take 1 tablet (75 mg total) by mouth daily. 30 tablet 0   levothyroxine (SYNTHROID) 125 MCG tablet Take 1 tablet (125 mcg total) by mouth daily at 6 (six) AM. 90 tablet 0   methylPREDNISolone (MEDROL DOSEPAK) 4 MG TBPK tablet Take as directed on package. 21 tablet 0   Omega-3 Fatty Acids (FISH OIL PO) Take 1,000 mg by mouth daily.     pantoprazole (PROTONIX) 40 MG tablet Take 1 tablet (40 mg total) by mouth 2 (two) times daily before a meal. 60 tablet 0   Potassium 99 MG TABS Take 99 mg by mouth  daily.     QUEtiapine (SEROQUEL) 25 MG tablet Take 1 tablet (25 mg total) by mouth at bedtime. 90 tablet 1   RIVAROXABAN (XARELTO) VTE STARTER PACK (15 & 20 MG) Follow package directions: Take one 15mg  tablet by mouth twice a day. On day 22, switch to one 20mg  tablet once a day. Take with food. 51 each 0   vitamin B-12 (CYANOCOBALAMIN) 250 MCG tablet Take 1 tablet (250 mcg total) by mouth daily. 30 tablet 0   No current facility-administered medications on file prior to visit.    Allergies:   Allergies  Allergen Reactions   Gluten  Meal Nausea And Vomiting    [name] has CELIAC DISEASE   Wheat Bran Nausea And Vomiting    [name] has CELIAC DISEASE   Adhesive [Tape] Other (See Comments)    [name]'s skin is VERY THIN and tears very easily; please use either paper tape or Coban wrap   Codeine Nausea And Vomiting   Other Other (See Comments)    [name] has Hypoglycemia; her husband stated "when her sugar crashes, it crashes hard."   Penicillins Other (See Comments)    Doesn't remember reaction , but denies any SOB/Swelling States " it was a long time ago "     Physical Exam General: Frail elderly Caucasian lady., seated, in no evident distress Head: head normocephalic and atraumatic.   Neck: supple with no carotid or supraclavicular bruits Cardiovascular: regular rate and rhythm, no murmurs Musculoskeletal: Severe kyphoscoliosis. Skin:  no rash/petichiae Vascular:  Normal pulses all extremities  Neurologic Exam Mental Status: Awake and fully alert. Oriented to place and time. Recent and remote memory intact. Attention span, concentration and fund of knowledge appropriate. Mood and affect appropriate.  Diminished recall 0/3.  Able to name only 7 animals which can walk on 4 legs.  Clock drawing is impaired 2/4.  Unable to copy intersecting pentagons. Cranial Nerves: Fundoscopic exam reveals sharp disc margins. Pupils equal, briskly reactive to light. Extraocular movements full without  nystagmus. Visual fields show dense right homonymous hemianopsia l to confrontation. Hearing intact. Facial sensation intact. Face, tongue, palate moves normally and symmetrically.  Motor: Normal bulk and tone. Normal strength in all tested extremity muscles. Sensory.: intact to touch , pinprick , position and vibratory sensation.  Coordination: Rapid alternating movements normal in all extremities. Finger-to-nose and heel-to-shin performed accurately bilaterally. Gait and Station: Arises from chair without difficulty.  Uses a walker stance is nearly stooped.  L. Gait demonstrates slight imbalance but uses a walker well..  Not able to heel, toe and tandem walk Reflexes: 1+ and symmetric. Toes downgoing.   NIHSS  2 Modified Rankin  3   ASSESSMENT: 84 year old Caucasian lady lady with left posterior cerebral artery infarction secondary to left P2 occlusion in September 2022 with significant residual right-sided homonymous hemianopsia as well as poststroke cognitive impairment versus early dementia.  She has recent left femoral vein deep vein thrombosis for which she has been started on anticoagulation.  Vascular risk factors of hypertension, hyperlipidemia and age     PLAN:I had a long discussion with Caroline [name], her husband and daughter regarding her recent stroke and worsening memory and cognitive impairment which is likely due to combination of mild vascular dementia with underlying age-related cognitive impairment.  I recommend she discontinue aspirin and stay on Xarelto alone given history of DVT for stroke prevention and maintain aggressive risk factor modification with strict control of hypertension with blood pressure goal below 130/90, lipids with LDL cholesterol goal below 70 mg percent and diabetes with hemoglobin A1c goal below 6.5%.  She was encouraged to use a walker at all times and discussed fall safety precautions.  We also discussed memory compensation strategies and advised her to  increase participation in cognitively challenging activities like solving crossword puzzles, playing bridge and sodoku.  She will return for follow-up in Caroline future in 3 months with my nurse practitioner on call earlier if necessary.  Greater than 50% time during this 45-minute consultation was it was spent on counseling and coordination of care about her stroke and memory loss and cognitive impairment and answering questions. Adalis Gatti  Leonie Man, MD  Note: This document was prepared with digital dictation and possible smart phrase technology. Any transcriptional errors that result from this process are unintentional.

## 2021-08-11 ENCOUNTER — Other Ambulatory Visit: Payer: Self-pay | Admitting: Physician Assistant

## 2021-08-11 ENCOUNTER — Telehealth: Payer: Self-pay | Admitting: Physician Assistant

## 2021-08-11 DIAGNOSIS — I82412 Acute embolism and thrombosis of left femoral vein: Secondary | ICD-10-CM

## 2021-08-11 MED ORDER — APIXABAN 5 MG PO TABS: 5.0000 mg | ORAL_TABLET | Freq: Two times a day (BID) | ORAL | 0 refills | Status: DC

## 2021-08-11 NOTE — Telephone Encounter (Signed)
Spoke with patient's spouse, Caroline [name], and he stated the patient was negative for fever, nausea, and abdominal pain. Spoke with Kandis Cocking and we are changing her medication to Eliquis. Will be called into [facility].

## 2021-08-11 NOTE — Telephone Encounter (Signed)
Pt's spouse called and stated pt was put on Xarelto 8 days ago and after the 4th day of being on medication has started having diarrhea. Can you please advise? [phone]

## 2021-08-14 ENCOUNTER — Telehealth: Payer: Self-pay | Admitting: Physician Assistant

## 2021-08-14 NOTE — Telephone Encounter (Signed)
[phone]  Tresa Endo (patients daughter-on DPR)   Patients daughter called stating that patient is still having diarrhea, even after switching the anticoagulant. She is requesting that we switch the anticoagulant back to Plavix and increase the dose.   I advised that Kandis Cocking was out of the office until Monday and that we would not be able to reply to this until then. Kelly verbalized understating. She asked for some medication for the diarrhea, I advised her to use Imodium OTC to see if that helped (advised to double check with pharmacist to insure no drug interactions first) Tresa Endo verbalized understanding and was agreeable. AS, CMA

## 2021-09-09 ENCOUNTER — Other Ambulatory Visit: Payer: Self-pay

## 2021-09-09 ENCOUNTER — Encounter: Payer: Self-pay | Admitting: Nurse Practitioner

## 2021-09-09 ENCOUNTER — Ambulatory Visit (INDEPENDENT_AMBULATORY_CARE_PROVIDER_SITE_OTHER): Payer: Medicare Other | Admitting: Nurse Practitioner

## 2021-09-09 ENCOUNTER — Telehealth: Payer: Self-pay | Admitting: Physician Assistant

## 2021-09-09 VITALS — BP 149/70 | HR 94 | Temp 97.0°F | Ht 64.0 in | Wt 134.1 lb

## 2021-09-09 DIAGNOSIS — E039 Hypothyroidism, unspecified: Secondary | ICD-10-CM

## 2021-09-09 DIAGNOSIS — R319 Hematuria, unspecified: Secondary | ICD-10-CM | POA: Diagnosis not present

## 2021-09-09 DIAGNOSIS — F05 Delirium due to known physiological condition: Secondary | ICD-10-CM

## 2021-09-09 DIAGNOSIS — N390 Urinary tract infection, site not specified: Secondary | ICD-10-CM | POA: Diagnosis not present

## 2021-09-09 DIAGNOSIS — M25561 Pain in right knee: Secondary | ICD-10-CM

## 2021-09-09 MED ORDER — SULFAMETHOXAZOLE-TRIMETHOPRIM 400-80 MG PO TABS: 1.0000 | ORAL_TABLET | Freq: Two times a day (BID) | ORAL | 0 refills | Status: DC

## 2021-09-09 NOTE — Telephone Encounter (Signed)
close

## 2021-09-09 NOTE — Progress Notes (Signed)
Acute Office Visit  Subjective:    Patient ID: Caroline [name], female    DOB: December 07, 1935, 85 y.o.   MRN: [id number]  Chief Complaint  Patient presents with   Urinary Tract Infection    confusion     HPI Patient is in today for evaluation of possible UTI.  Patient has been having increased confusion.  Has been having difficulty sleeping.  She is agitated.  This is new behavior for the patient.  Family member states that patient has had frequent episodes of diarrhea recently.  It is difficult to tell if confusion is long-term symptom of recent CVA or something different is going on.  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 Cheek   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   Bridge Left Nose, and Left Neck   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 Cheek Tx: curret x 3 and 5FU   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   Bridge of nose tx Cx3 and 5FU, Left neck Tx Cx3 and 5FU   Thrombophlebitis     Past Surgical History:  Procedure Laterality Date   ABDOMINAL HYSTERECTOMY  1979   CATARACT EXTRACTION, BILATERAL     knee surgery Left    VARICOSE VEIN SURGERY Left    XI ROBOTIC ASSISTED PARAESOPHAGEAL HERNIA REPAIR N/A 06/15/2021   Procedure: XI ROBOTIC ASSISTED PARAESOPHAGEAL HERNIA REPAIR, GASTROPEXY, UPPER ENDOSCOPY;  Surgeon: Clovis Riley, MD;  Location: [facility];  Service: General;  Laterality: N/A;    Family History  Problem Relation Age  of Onset   Arthritis Mother        pt denies   Hypertension Mother        pt denies   Obesity Sister    Cancer Maternal Uncle        type unknown   Colon cancer Neg Hx    Stomach cancer Neg Hx    Esophageal cancer Neg Hx    Pancreatic cancer Neg Hx     Social History   Socioeconomic History   Marital status: Married    Spouse name: [name]   Number of children: 3   Years of education: 12   Highest education level: Not on file  Occupational History   Occupation: retired  Tobacco Use   Smoking status: Never   Smokeless tobacco: Never  Vaping Use   Vaping Use: Never used  Substance and Sexual Activity   Alcohol use: No   Drug use: Not Currently   Sexual activity: Never  Other Topics Concern   Not on file  Social History Narrative   Lives with husband   Right Handed   Drinks no caffeine   Social Determinants of Health   Financial Resource Strain: Not on file  Food Insecurity: Not on file  Transportation Needs: Not on file  Physical Activity:  Not on file  Stress: Not on file  Social Connections: Not on file  Intimate Partner Violence: Not on file    Outpatient Medications Prior to Visit  Medication Sig Dispense Refill   acetaminophen (TYLENOL) 325 MG tablet Take 2 tablets (650 mg total) by mouth every 6 (six) hours as needed for mild pain, moderate pain, fever or headache.     apixaban (ELIQUIS) 5 MG TABS tablet Take 1 tablet (5 mg total) by mouth 2 (two) times daily. 180 tablet 0   Ascorbic Acid (VITAMIN C) 100 MG tablet Take 1 tablet (100 mg total) by mouth every other day. 30 tablet 0   atorvastatin (LIPITOR) 80 MG tablet Take 1 tablet (80 mg total) by mouth at bedtime. 90 tablet 1   Catheters MISC Use as directed. 1 box of inserts and 1 box canisters. 1 each 3   Cholecalciferol (VITAMIN D-3) 25 MCG (1000 UT) CAPS Take 1 capsule (1,000 Units total) by mouth daily. 30 capsule 0   Coenzyme Q10 (COQ10 PO) Take 1 tablet by mouth daily.     docusate sodium  (COLACE) 100 MG capsule Take 2 capsules (200 mg total) by mouth 2 (two) times daily. 10 capsule 0   furosemide (LASIX) 20 MG tablet Take 1 tablet (20 mg total) by mouth daily as needed for edema. 90 tablet 0   irbesartan (AVAPRO) 75 MG tablet Take 1 tablet (75 mg total) by mouth daily. 30 tablet 0   levothyroxine (SYNTHROID) 125 MCG tablet Take 1 tablet (125 mcg total) by mouth daily at 6 (six) AM. 90 tablet 0   Omega-3 Fatty Acids (FISH OIL PO) Take 1,000 mg by mouth daily.     pantoprazole (PROTONIX) 40 MG tablet Take 1 tablet (40 mg total) by mouth 2 (two) times daily before a meal. 60 tablet 0   Potassium 99 MG TABS Take 99 mg by mouth daily.     QUEtiapine (SEROQUEL) 25 MG tablet Take 1 tablet (25 mg total) by mouth at bedtime. 90 tablet 1   vitamin B-12 (CYANOCOBALAMIN) 250 MCG tablet Take 1 tablet (250 mcg total) by mouth daily. 30 tablet 0   methylPREDNISolone (MEDROL DOSEPAK) 4 MG TBPK tablet Take as directed on package. 21 tablet 0   No facility-administered medications prior to visit.    Allergies  Allergen Reactions   Gluten Meal Nausea And Vomiting    Patient has CELIAC DISEASE   Wheat Bran Nausea And Vomiting    Patient has CELIAC DISEASE   Adhesive [Tape] Other (See Comments)    Patient's skin is VERY THIN and tears very easily; please use either paper tape or Coban wrap   Codeine Nausea And Vomiting   Other Other (See Comments)    Patient has Hypoglycemia; her husband stated "when her sugar crashes, it crashes hard."   Penicillins Other (See Comments)    Doesn't remember reaction , but denies any SOB/Swelling States " it was a long time ago "     Review of Systems  Constitutional:  Positive for activity change. Negative for appetite change, chills, fatigue and fever.  HENT:  Negative for congestion, postnasal drip, rhinorrhea, sinus pressure, sinus pain, sneezing and sore throat.   Eyes: Negative.   Respiratory:  Negative for cough, chest tightness, shortness of  breath and wheezing.   Cardiovascular:  Negative for chest pain and palpitations.  Gastrointestinal:  Positive for diarrhea and nausea. Negative for abdominal pain, constipation and vomiting.  Endocrine: Negative for cold intolerance, heat  intolerance, polydipsia and polyuria.       History of hypothyroid.  Due to have thyroid panel checked.  Genitourinary:  Positive for frequency. Negative for dyspareunia, dysuria, flank pain and urgency.       Several episodes of urinary incontinence every day.  Musculoskeletal:  Negative for arthralgias, back pain and myalgias.  Skin:  Negative for rash.  Allergic/Immunologic: Negative for environmental allergies.  Neurological:  Positive for weakness. Negative for dizziness and headaches.  Hematological:  Negative for adenopathy.  Psychiatric/Behavioral:  Positive for agitation and confusion. The patient is not nervous/anxious.       Objective:    Physical Exam Vitals and nursing note reviewed.  Constitutional:      Appearance: Normal appearance. She is well-developed.  HENT:     Head: Normocephalic and atraumatic.     Nose: Nose normal.  Eyes:     Pupils: Pupils are equal, round, and reactive to light.  Cardiovascular:     Rate and Rhythm: Normal rate and regular rhythm.     Pulses: Normal pulses.     Heart sounds: Normal heart sounds.  Pulmonary:     Effort: Pulmonary effort is normal.     Breath sounds: Normal breath sounds.  Abdominal:     Palpations: Abdomen is soft.  Genitourinary:    Comments: Large WBC and moderate blood in the urine sample today. Musculoskeletal:        General: Normal range of motion.     Cervical back: Normal range of motion and neck supple.  Lymphadenopathy:     Cervical: No cervical adenopathy.  Skin:    General: Skin is warm and dry.     Capillary Refill: Capillary refill takes less than 2 seconds.  Neurological:     General: No focal deficit present.     Mental Status: She is alert and oriented to  person, place, and time.     Motor: Weakness present.  Psychiatric:        Attention and Perception: She is inattentive.        Mood and Affect: Mood is anxious.        Speech: Speech normal.        Behavior: Behavior normal. Behavior is cooperative.        Thought Content: Thought content normal.        Cognition and Memory: Cognition is impaired. Memory is impaired. She exhibits impaired recent memory.        Judgment: Judgment normal.    Today's Vitals   09/09/21 1544  BP: (!) 149/70  Pulse: 94  Temp: (!) 97 F (36.1 C)  SpO2: 97%  Weight: 134 lb 1.3 oz (60.8 kg)  Height: _0  (1.626 m)   Body mass index is 23.01 kg/m.   Wt Readings from Last 3 Encounters:  09/10/21 129 lb (58.5 kg)  09/09/21 134 lb 1.3 oz (60.8 kg)  08/05/21 141 lb (64 kg)    Health Maintenance Due  Topic Date Due   Pneumonia Vaccine 6+ Years old (1 - PCV) Never done   TETANUS/TDAP  Never done   Zoster Vaccines- Shingrix (1 of 2) Never done   DEXA SCAN  Never done   COVID-19 Vaccine (4 - Booster for Pfizer series) 11/10/2020    There are no preventive care reminders to display for this patient.   Lab Results  Component Value Date   TSH 3.410 09/09/2021   Lab Results  Component Value Date   WBC 8.6 07/29/2021  HGB 11.4 07/29/2021   HCT 35.2 07/29/2021   MCV 86 07/29/2021   PLT 415 07/29/2021   Lab Results  Component Value Date   NA 137 07/29/2021   K 4.6 07/29/2021   CO2 23 07/29/2021   GLUCOSE 79 07/29/2021   BUN 10 07/29/2021   CREATININE 0.63 07/29/2021   BILITOT 0.3 07/29/2021   ALKPHOS 127 (H) 07/29/2021   AST 21 07/29/2021   ALT 11 07/29/2021   PROT 6.5 07/29/2021   ALBUMIN 3.6 07/29/2021   CALCIUM 9.4 07/29/2021   ANIONGAP 11 07/17/2021   EGFR 88 07/29/2021   Lab Results  Component Value Date   CHOL 216 (H) 06/19/2021   Lab Results  Component Value Date   HDL 75 06/19/2021   Lab Results  Component Value Date   LDLCALC 123 (H) 06/19/2021   Lab Results   Component Value Date   TRIG 88 06/19/2021   Lab Results  Component Value Date   CHOLHDL 2.9 06/19/2021   Lab Results  Component Value Date   HGBA1C 5.5 06/19/2021       Assessment & Plan:  1. Urinary tract infection with hematuria, site unspecified Will start Bactrim 400/80 mg tablets.  Take twice daily for next 7 days.  Send urine for culture and sensitivity and adjust antibiotic treatment as indicated. - sulfamethoxazole-trimethoprim (BACTRIM) 400-80 MG tablet; Take 1 tablet by mouth 2 (two) times daily.  Dispense: 14 tablet; Refill: 0 - POCT URINALYSIS DIP (CLINITEK) - Urine Culture; Future - Urine Culture  2. Delirium due to multiple etiologies Patient suffering from delirium after having a CVA earlier this year.  She is currently seeing neurology and will continue to do so as scheduled.  3. Acquired hypothyroidism Check TSH along with T3 and free T4 for further evaluation.  Adjust levothyroxine as indicated. - TSH; Future - T4 - T3 - TSH    Problem List Items Addressed This Visit       Endocrine   Hypothyroidism   Relevant Orders   TSH (Completed)   T4 (Completed)   T3 (Completed)     Nervous and Auditory   Delirium due to multiple etiologies     Genitourinary   Urinary tract infection with hematuria - Primary   Relevant Medications   sulfamethoxazole-trimethoprim (BACTRIM) 400-80 MG tablet   Other Relevant Orders   POCT URINALYSIS DIP (CLINITEK) (Completed)   Urine Culture (Completed)     Meds ordered this encounter  Medications   sulfamethoxazole-trimethoprim (BACTRIM) 400-80 MG tablet    Sig: Take 1 tablet by mouth 2 (two) times daily.    Dispense:  14 tablet    Refill:  0    Order Specific Question:   Supervising Provider    Answer:   Beatrice Lecher D [[id number]]      Ronnell Freshwater, NP  This note was dictated using Dragon Voice Recognition Software. Rapid proofreading was performed to expedite the delivery of the information. Despite  proofreading, phonetic errors will occur which are common with this voice recognition software. Please take this into consideration. If there are any concerns, please contact our office.

## 2021-09-10 ENCOUNTER — Encounter: Payer: Self-pay | Admitting: Physical Medicine & Rehabilitation

## 2021-09-10 ENCOUNTER — Encounter: Payer: Medicare Other | Attending: Registered Nurse | Admitting: Physical Medicine & Rehabilitation

## 2021-09-10 VITALS — BP 132/72 | HR 93 | Temp 98.5°F | Ht 64.0 in | Wt 129.0 lb

## 2021-09-10 DIAGNOSIS — I63432 Cerebral infarction due to embolism of left posterior cerebral artery: Secondary | ICD-10-CM

## 2021-09-10 LAB — POCT URINALYSIS DIP (CLINITEK)
Bilirubin, UA: NEGATIVE
Glucose, UA: NEGATIVE mg/dL
Ketones, POC UA: NEGATIVE mg/dL
Nitrite, UA: NEGATIVE
POC PROTEIN,UA: NEGATIVE
Spec Grav, UA: 1.025 (ref 1.010–1.025)
Urobilinogen, UA: 0.2 E.U./dL
pH, UA: 5.5 (ref 5.0–8.0)

## 2021-09-10 LAB — T4: T4, Total: 10.3 ug/dL (ref 4.5–12.0)

## 2021-09-10 LAB — T3: T3, Total: 75 ng/dL (ref 71–180)

## 2021-09-10 LAB — TSH: TSH: 3.41 u[IU]/mL (ref 0.450–4.500)

## 2021-09-10 NOTE — Progress Notes (Signed)
Patient had been started on low dose bactrim at time of visit. Will wait on couture and sensitivity results.

## 2021-09-10 NOTE — Progress Notes (Signed)
Subjective:    Patient ID: Caroline [name], female    DOB: Apr 30, 1936, 85 y.o.   MRN: [id number] Admit date: 06/24/2021 Discharge date: 07/07/2021  85 y.o. right-handed female with history of prior right thalamic CVA 2019 maintained on Plavix mild cognitive impairment chronic back pain history of DVT paraesophageal hernia hypertension hyperlipidemia.  Per chart review lives with spouse.  Independent prior to admission able to perform her own ADLs.  Presented to [facility] 06/15/2021 with intermittent heartburn and episodes of severe regurgitation.  She had an endoscopy February 2020 confirming grade D reflux esophagitis and again June 2020 with little improvement in endoscopic findings on high-dose PPI.  Biopsies were negative.  She had a normal gastric emptying study 2020.  Followed by general surgery patient underwent robotic paraesophageal hernia repair gastropexy upper endoscopy 06/15/2021 per Dr. Doylene Canard.  Her diet was slowly advanced.  Post procedure noted somnolence increased disorientation CT/MRI showed large acute left PCA territory infarct without hemorrhage or mass-effect.  Patient did not receive tPA.  MRA showed occlusion of the left PCA at the distal P2 segment.  Severe atrophy and chronic small vessel ischemia.  Echocardiogram with ejection fraction of 60 to 65% grade 1 diastolic dysfunction  HPI  HH Speech therapy continues Seen by PCP for increased confusion, UA pending , thyroid level .  BPs normal , no fevers Has had some diarrhea, hx celiac disease  Since D/C had 07/17/2021 ED visit for MSK chest pain, CT chest neg This was followed by ED visit for DVT 07/30/21 started on Xarelto, stopped Plavix, as recommended by neurology Patient switched from Xarelto to Eliquis regarding family concerns about diarrhea.  No change in diarrhea, now family wants to switch back to Xarelto due to cost No falls at home  Some increased confusion noted, seen by primary care ordered UA CNS as well as  thyroid studies and results are pending Pain Inventory Average Pain 0 Pain Right Now 0 My pain is  no pain  LOCATION OF PAIN  no pain  BOWEL Number of stools per week: 7 Oral laxative use No  Type of laxative na Enema or suppository use No  History of colostomy No  Incontinent Yes   BLADDER Pads In and out cath, frequency at night Able to self cath Yes  Bladder incontinence Yes  Frequent urination Yes  Leakage with coughing No  Difficulty starting stream Yes  Incomplete bladder emptying No    Mobility walk with assistance use a walker how many minutes can you walk? 3-5 ability to climb steps?  no do you drive?  no  Function retired  Neuro/Psych bladder control problems trouble walking confusion depression  Prior Studies Any changes since last visit?  no  Physicians involved in your care Any changes since last visit?  no   Family History  Problem Relation Age of Onset   Arthritis Mother        pt denies   Hypertension Mother        pt denies   Obesity Sister    Cancer Maternal Uncle        type unknown   Colon cancer Neg Hx    Stomach cancer Neg Hx    Esophageal cancer Neg Hx    Pancreatic cancer Neg Hx    Social History   Socioeconomic History   Marital status: Married    Spouse name: [name]   Number of children: 3   Years of education: 12   Highest education level:  Not on file  Occupational History   Occupation: retired  Tobacco Use   Smoking status: Never   Smokeless tobacco: Never  Vaping Use   Vaping Use: Never used  Substance and Sexual Activity   Alcohol use: No   Drug use: Not Currently   Sexual activity: Never  Other Topics Concern   Not on file  Social History Narrative   Lives with husband   Right Handed   Drinks no caffeine   Social Determinants of Health   Financial Resource Strain: Not on file  Food Insecurity: Not on file  Transportation Needs: Not on file  Physical Activity: Not on file  Stress: Not on  file  Social Connections: Not on file   Past Surgical History:  Procedure Laterality Date   ABDOMINAL HYSTERECTOMY  1979   CATARACT EXTRACTION, BILATERAL     knee surgery Left    VARICOSE VEIN SURGERY Left    XI ROBOTIC ASSISTED PARAESOPHAGEAL HERNIA REPAIR N/A 06/15/2021   Procedure: XI ROBOTIC ASSISTED PARAESOPHAGEAL HERNIA REPAIR, GASTROPEXY, UPPER ENDOSCOPY;  Surgeon: Berna Bue, MD;  Location: [facility];  Service: General;  Laterality: N/A;   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 Cheek   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   Bridge Left Nose, and Left Neck   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 Cheek Tx: curret x 3 and 5FU   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   Bridge of nose tx Cx3 and 5FU, Left neck Tx Cx3 and 5FU   Thrombophlebitis    BP 132/72   Pulse 93   Temp 98.5 F (36.9 C)   Ht 5\' 4"  (1.626 m)   Wt 129 lb (58.5 kg)   SpO2 94%   BMI 22.14 kg/m   Opioid Risk Score:   Fall Risk Score:  `1  Depression screen PHQ 2/9  Depression screen [facility] 2/9 07/28/2021 07/21/2021  Decreased Interest 1 1  Down, Depressed, Hopeless 1 1  PHQ - 2 Score 2 2  Altered sleeping 1 0  Tired, decreased energy 2 1  Change in appetite 1 1  Feeling bad or failure about yourself  1 0  Trouble concentrating 2 3  Moving slowly or fidgety/restless 2 1  Suicidal thoughts 0 0  PHQ-9 Score 11 8  Difficult doing work/chores - Very  difficult     Review of Systems     Objective:   Physical Exam Vitals and nursing note reviewed.  HENT:     Head: Normocephalic and atraumatic.  Eyes:     General: Visual field deficit present.  Musculoskeletal:        General: No tenderness or deformity.     Comments: Range of motion within functional limits bilateral shoulders elbows wrist hands hips and knees and ankles. No pain with range of motion,  no joint swelling noted.  Skin:    General: Skin is warm and dry.  Neurological:     Mental Status: She is alert and oriented to person, place, and time.     Cranial Nerves: Facial asymmetry present. No dysarthria.     Sensory: Sensation is intact.     Motor: No tremor.     Coordination: Coordination normal.     Gait: Gait abnormal.     Comments: Right homonymous hemianopsia  Ambulates with a rolling walker no evidence of toe drag or knee instability  Psychiatric:        Mood and Affect: Mood normal.        Behavior: Behavior normal.          Assessment & Plan:   1.  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  Overall making a good recovery.  Recommend continuation of PT and OT, have reordered Physical medicine rehab follow-up in 3 months 2.  Recent DVT now on DOAC, follow-up with primary care and neurology Discussed deficits as well as therapy needs with patient as well as caregivers Over half of the 35 min visit was spent counseling and coordinating care.

## 2021-09-15 ENCOUNTER — Telehealth: Payer: Self-pay | Admitting: Physician Assistant

## 2021-09-15 NOTE — Telephone Encounter (Signed)
Patients husband called and left vm asking about results for uti? Requesting a call back at [phone]

## 2021-09-15 NOTE — Telephone Encounter (Signed)
Called the patient to speak the husband and let him know the final report on the urine culture has not come back yet. He gave verbal understanding.

## 2021-09-16 NOTE — Progress Notes (Signed)
Waiting on culture and sensitivity results from urine.

## 2021-09-17 ENCOUNTER — Telehealth: Payer: Self-pay | Admitting: Physician Assistant

## 2021-09-17 NOTE — Telephone Encounter (Signed)
Patient's husband called questioning her urine culture. Seems a bit upset, states it has been over a week and not heard anything. Please advise. [phone]

## 2021-09-17 NOTE — Telephone Encounter (Signed)
Patient spouse is aware the results are not back. AS, CMA

## 2021-09-17 NOTE — Telephone Encounter (Signed)
Patient spouse is requesting urine culture results. AS, CMA

## 2021-09-17 NOTE — Telephone Encounter (Signed)
The final results of the culture and sensitivity aren't back yet. Just positive for bacteria. There is many bacteria but the final results aren't back

## 2021-09-18 NOTE — Progress Notes (Signed)
Please let the patient/caregiver know that patient's urine showed e-coli. Bactrim she was given was good antibiotic for this infection. Please ask how she is doing. May need to do second round antibiotics. Thanks.

## 2021-09-21 ENCOUNTER — Other Ambulatory Visit: Payer: Self-pay | Admitting: Nurse Practitioner

## 2021-09-21 DIAGNOSIS — N390 Urinary tract infection, site not specified: Secondary | ICD-10-CM

## 2021-09-21 LAB — URINE CULTURE

## 2021-09-21 MED ORDER — SULFAMETHOXAZOLE-TRIMETHOPRIM 800-160 MG PO TABS: 1.0000 | ORAL_TABLET | Freq: Two times a day (BID) | ORAL | 0 refills | Status: DC

## 2021-09-21 NOTE — Telephone Encounter (Signed)
Please let them know that I increased strength of bactrim to bactrim DS twice daily for next 7 days. Continue to increase fluid intake, and try to consume something like cranberry or apple juice also. If symptoms are persistent, they should contact us and let us know.

## 2021-09-23 ENCOUNTER — Other Ambulatory Visit: Payer: Self-pay | Admitting: Nurse Practitioner

## 2021-09-23 DIAGNOSIS — R319 Hematuria, unspecified: Secondary | ICD-10-CM

## 2021-09-23 MED ORDER — NITROFURANTOIN MONOHYD MACRO 100 MG PO CAPS: 100.0000 mg | ORAL_CAPSULE | Freq: Two times a day (BID) | ORAL | 0 refills | Status: DC

## 2021-09-23 NOTE — Progress Notes (Signed)
Please let the patient and family know that second bacteria was identified (enterococcus). I have added a second antibiotic for this. It is macrobid. It should also be taken twice daily for next 7 days. Thanks so much.   -HB

## 2021-09-30 ENCOUNTER — Telehealth: Payer: Self-pay | Admitting: Physician Assistant

## 2021-09-30 NOTE — Telephone Encounter (Signed)
Home health called and reported that patient fell a week ago from today, has bruise on lower back and butt area. They would like to extend in home PT for four more weeks. [phone]

## 2021-09-30 NOTE — Telephone Encounter (Signed)
Called [place] back and gave the verbal ok.

## 2021-10-07 ENCOUNTER — Other Ambulatory Visit (INDEPENDENT_AMBULATORY_CARE_PROVIDER_SITE_OTHER): Payer: Medicare Other

## 2021-10-07 ENCOUNTER — Telehealth: Payer: Self-pay | Admitting: Neurology

## 2021-10-07 ENCOUNTER — Telehealth: Payer: Self-pay | Admitting: Physician Assistant

## 2021-10-07 DIAGNOSIS — N390 Urinary tract infection, site not specified: Secondary | ICD-10-CM

## 2021-10-07 DIAGNOSIS — R319 Hematuria, unspecified: Secondary | ICD-10-CM | POA: Diagnosis not present

## 2021-10-07 LAB — POCT URINALYSIS DIP (CLINITEK)
Bilirubin, UA: NEGATIVE
Blood, UA: NEGATIVE
Glucose, UA: NEGATIVE mg/dL
Ketones, POC UA: NEGATIVE mg/dL
Nitrite, UA: NEGATIVE
POC PROTEIN,UA: NEGATIVE
Spec Grav, UA: 1.015 (ref 1.010–1.025)
Urobilinogen, UA: 0.2 E.U./dL
pH, UA: 5.5 (ref 5.0–8.0)

## 2021-10-07 NOTE — Telephone Encounter (Signed)
The urine POCT showing a trace of WBC only. We really do need to wait and see culture results. I'm not sure there is infection there. If she is continuing to have increased levels of confusion, she may need to see the neurologist. I believe she started seeing one after she had CVA

## 2021-10-07 NOTE — Progress Notes (Signed)
Poct  

## 2021-10-07 NOTE — Telephone Encounter (Signed)
I spoke to her husband. States her memory has significantly declined. However, she was diagnosed with a UTI and treated with antibiotics on 09/10/21. Says she is showing signs of another infection. She has a suprapubic catheter in place. He took a sample in today for a UA. Results pending. For now, the plan is to wait for those results. If positive, her PCP will treat the infection. If negative, he will call our office back for an earlier appt.

## 2021-10-07 NOTE — Progress Notes (Signed)
Will send for culture and sensitivity and treat as indicated

## 2021-10-07 NOTE — Telephone Encounter (Signed)
Per last note on 08/05/21:  She will return for follow-up in the future in 3 months with my nurse practitioner or call earlier if necessary.  ______________________________________

## 2021-10-07 NOTE — Telephone Encounter (Signed)
Patients spouse is aware of the below and verbalized understanding. He states he will reach out to Urologist. AS, CMA

## 2021-10-07 NOTE — Telephone Encounter (Signed)
Pt's husband called states his wife memory is declining and wants her to be seen sooner. Pt's husband is requesting a call back.

## 2021-10-07 NOTE — Telephone Encounter (Signed)
Patients spouse called office stating he has collected a urine sample from patient. He states she has had a lot of confusion lately and thinks its caused by at UTI.  This sample is not a clean catch. He is asking for [place] to treat her with antibiotic now before culture comes back. I advised that the POC urine in office will not be accurate because the urine is not a clean catch and that likely the provider will want to wait until the culture comes back. Caroline [name] does not like that idea because of how long cultures are taking to come back (10-15 days).

## 2021-10-09 LAB — URINE CULTURE

## 2021-10-12 ENCOUNTER — Ambulatory Visit: Payer: Medicare Other | Admitting: Adult Health

## 2021-10-12 ENCOUNTER — Other Ambulatory Visit: Payer: Self-pay | Admitting: Nurse Practitioner

## 2021-10-12 DIAGNOSIS — N390 Urinary tract infection, site not specified: Secondary | ICD-10-CM

## 2021-10-12 DIAGNOSIS — R319 Hematuria, unspecified: Secondary | ICD-10-CM

## 2021-10-12 MED ORDER — NITROFURANTOIN MONOHYD MACRO 100 MG PO CAPS: 100.0000 mg | ORAL_CAPSULE | Freq: Two times a day (BID) | ORAL | 0 refills | Status: DC

## 2021-10-12 NOTE — Progress Notes (Signed)
Please let the patient/husband know that urine did show enterobacter infection. Macrobid is one of the only oral medications which we can use for this. I have sent a new prescription for this to [facility]. This is taken twice daily for the next 7 days.  Thanks so much.   -HB

## 2021-10-13 NOTE — Telephone Encounter (Signed)
Pt's husband called states his wife does in fact have a UTI.

## 2021-10-13 NOTE — Telephone Encounter (Signed)
noted 

## 2021-10-20 ENCOUNTER — Telehealth: Payer: Self-pay | Admitting: Nurse Practitioner

## 2021-10-20 ENCOUNTER — Telehealth: Payer: Self-pay | Admitting: Neurology

## 2021-10-20 NOTE — Telephone Encounter (Signed)
We can see her again and do an evaluation and draw some additional labs. We an also have some subsequent imaging done to see if there is evidence of new findings. She will need to get appointment.

## 2021-10-20 NOTE — Telephone Encounter (Signed)
At 10:28 pt's husband left a vm asking for a call back from [place], [place] re: an urgent matter.

## 2021-10-20 NOTE — Telephone Encounter (Signed)
I spoke to the patient's husband. Reports his wife has worsening confusion today. Pt referred to her husband as her mother this morning. Just completed antibiotics on 10/19/21 for a persistent UTI. He is concerned the infection is still not cleared. He was instructed to contact her PCP today for an appt to be physically evaluated. He is in agreement with this plan.

## 2021-10-20 NOTE — Telephone Encounter (Signed)
Appointment scheduled for 10/21/2021

## 2021-10-20 NOTE — Telephone Encounter (Signed)
Patient's husband left a vm stating the neurologist wants her to do a full work up with some labs. Is this something she can have done here? Do you need to look over the notes from the visit? [phone]

## 2021-10-21 ENCOUNTER — Ambulatory Visit (INDEPENDENT_AMBULATORY_CARE_PROVIDER_SITE_OTHER): Payer: Medicare Other | Admitting: Nurse Practitioner

## 2021-10-21 ENCOUNTER — Other Ambulatory Visit: Payer: Self-pay

## 2021-10-21 ENCOUNTER — Encounter: Payer: Self-pay | Admitting: Nurse Practitioner

## 2021-10-21 VITALS — BP 115/65 | HR 96 | Temp 97.3°F | Ht 64.0 in | Wt 137.1 lb

## 2021-10-21 DIAGNOSIS — R410 Disorientation, unspecified: Secondary | ICD-10-CM | POA: Diagnosis not present

## 2021-10-21 DIAGNOSIS — E538 Deficiency of other specified B group vitamins: Secondary | ICD-10-CM | POA: Diagnosis not present

## 2021-10-21 DIAGNOSIS — Z0000 Encounter for general adult medical examination without abnormal findings: Secondary | ICD-10-CM

## 2021-10-21 DIAGNOSIS — N390 Urinary tract infection, site not specified: Secondary | ICD-10-CM

## 2021-10-21 DIAGNOSIS — R319 Hematuria, unspecified: Secondary | ICD-10-CM

## 2021-10-21 DIAGNOSIS — E039 Hypothyroidism, unspecified: Secondary | ICD-10-CM

## 2021-10-21 LAB — POCT URINALYSIS DIP (CLINITEK)
Bilirubin, UA: NEGATIVE
Blood, UA: NEGATIVE
Glucose, UA: NEGATIVE mg/dL
Ketones, POC UA: NEGATIVE mg/dL
Leukocytes, UA: NEGATIVE
Nitrite, UA: POSITIVE — AB
POC PROTEIN,UA: NEGATIVE
Spec Grav, UA: 1.015 (ref 1.010–1.025)
Urobilinogen, UA: 0.2 E.U./dL
pH, UA: 6.5 (ref 5.0–8.0)

## 2021-10-21 MED ORDER — NITROFURANTOIN MONOHYD MACRO 100 MG PO CAPS: ORAL_CAPSULE | ORAL | 0 refills | Status: DC

## 2021-10-21 MED ORDER — NITROFURANTOIN MONOHYD MACRO 100 MG PO CAPS: ORAL_CAPSULE | ORAL | 2 refills | Status: DC

## 2021-10-21 NOTE — Progress Notes (Signed)
Established Patient Office Visit  Subjective:  Patient ID: Caroline [name], female    DOB: 17-Jan-1936  Age: 86 y.o. MRN: [id number]  CC:  Chief Complaint  Patient presents with   Follow-up    HPI [name] presents for concern of increased confusion and forgetfulness. She has had multiple urinary tract infections in recent months. Most recent infection was Enterococcus. She did take macrobid which was effective er culture and sensitivity results. The patient and her husband both report no change in level of confusion. In fact, they believe that confusion is gradually worsening. The patient's husband is also reporting increased physical  The patient has had TIAs and stroke. She does see neurology. Unclear if confusion is related to UTI vs sequelae of cerebral infarction.  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 Cheek   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   Bridge Left Nose, and Left Neck   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 Cheek Tx: curret x 3 and 5FU   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   Bridge of nose tx Cx3 and 5FU, Left neck Tx Cx3 and 5FU   Thrombophlebitis     Past Surgical History:  Procedure Laterality Date   ABDOMINAL HYSTERECTOMY  1979   CATARACT EXTRACTION, BILATERAL     knee surgery Left    VARICOSE VEIN SURGERY Left    XI ROBOTIC ASSISTED PARAESOPHAGEAL HERNIA REPAIR N/A 06/15/2021   Procedure: XI ROBOTIC ASSISTED  PARAESOPHAGEAL HERNIA REPAIR, GASTROPEXY, UPPER ENDOSCOPY;  Surgeon: Clovis Riley, MD;  Location: [facility];  Service: General;  Laterality: N/A;    Family History  Problem Relation Age of Onset   Arthritis Mother        pt denies   Hypertension Mother        pt denies   Obesity Sister    Cancer Maternal Uncle        type unknown   Colon cancer Neg Hx    Stomach cancer Neg Hx    Esophageal cancer Neg Hx    Pancreatic cancer Neg Hx     Social History   Socioeconomic History   Marital status: Married    Spouse name: [name]   Number of children: 3   Years of education: 12   Highest education level: Not on file  Occupational History   Occupation: retired  Tobacco Use   Smoking status: Never   Smokeless tobacco: Never  Vaping Use   Vaping Use: Never used  Substance and Sexual Activity   Alcohol use: No   Drug use: Not Currently   Sexual activity: Never  Other Topics Concern   Not on file  Social History Narrative   Lives with husband   Right Handed   Drinks no caffeine   Social Determinants of Health   Financial Resource Strain:  Not on file  Food Insecurity: Not on file  Transportation Needs: Not on file  Physical Activity: Not on file  Stress: Not on file  Social Connections: Not on file  Intimate Partner Violence: Not on file    Outpatient Medications Prior to Visit  Medication Sig Dispense Refill   acetaminophen (TYLENOL) 325 MG tablet Take 2 tablets (650 mg total) by mouth every 6 (six) hours as needed for mild pain, moderate pain, fever or headache.     apixaban (ELIQUIS) 5 MG TABS tablet Take 1 tablet (5 mg total) by mouth 2 (two) times daily. 180 tablet 0   Ascorbic Acid (VITAMIN C) 100 MG tablet Take 1 tablet (100 mg total) by mouth every other day. 30 tablet 0   atorvastatin (LIPITOR) 80 MG tablet Take 1 tablet (80 mg total) by mouth at bedtime. 90 tablet 1   Catheters MISC Use as directed. 1 box of inserts and 1 box canisters. 1 each 3    Cholecalciferol (VITAMIN D-3) 25 MCG (1000 UT) CAPS Take 1 capsule (1,000 Units total) by mouth daily. 30 capsule 0   Coenzyme Q10 (COQ10 PO) Take 1 tablet by mouth daily.     docusate sodium (COLACE) 100 MG capsule Take 2 capsules (200 mg total) by mouth 2 (two) times daily. 10 capsule 0   furosemide (LASIX) 20 MG tablet Take 1 tablet (20 mg total) by mouth daily as needed for edema. 90 tablet 0   irbesartan (AVAPRO) 75 MG tablet Take 1 tablet (75 mg total) by mouth daily. 30 tablet 0   levothyroxine (SYNTHROID) 125 MCG tablet Take 1 tablet (125 mcg total) by mouth daily at 6 (six) AM. 90 tablet 0   vitamin B-12 (CYANOCOBALAMIN) 250 MCG tablet Take 1 tablet (250 mcg total) by mouth daily. 30 tablet 0   Omega-3 Fatty Acids (FISH OIL PO) Take 1,000 mg by mouth daily.     nitrofurantoin, macrocrystal-monohydrate, (MACROBID) 100 MG capsule Take 1 capsule (100 mg total) by mouth 2 (two) times daily. 14 capsule 0   pantoprazole (PROTONIX) 40 MG tablet Take 1 tablet (40 mg total) by mouth 2 (two) times daily before a meal. 60 tablet 0   Potassium 99 MG TABS Take 99 mg by mouth daily.     QUEtiapine (SEROQUEL) 25 MG tablet Take 1 tablet (25 mg total) by mouth at bedtime. 90 tablet 1   No facility-administered medications prior to visit.    Allergies  Allergen Reactions   Gluten Meal Nausea And Vomiting    Patient has CELIAC DISEASE   Wheat Bran Nausea And Vomiting    Patient has CELIAC DISEASE   Adhesive [Tape] Other (See Comments)    Patient's skin is VERY THIN and tears very easily; please use either paper tape or Coban wrap   Codeine Nausea And Vomiting   Other Other (See Comments)    Patient has Hypoglycemia; her husband stated "when her sugar crashes, it crashes hard."   Penicillins Other (See Comments)    Doesn't remember reaction , but denies any SOB/Swelling States " it was a long time ago "     ROS Review of Systems  Constitutional:  Positive for activity change and fatigue.  Negative for appetite change, chills and fever.  HENT:  Negative for congestion, postnasal drip, rhinorrhea, sinus pressure, sinus pain, sneezing and sore throat.   Eyes: Negative.   Respiratory:  Negative for cough, chest tightness, shortness of breath and wheezing.   Cardiovascular:  Negative for  chest pain and palpitations.  Gastrointestinal:  Negative for abdominal pain, constipation, diarrhea, nausea and vomiting.  Endocrine: Negative for cold intolerance, heat intolerance, polydipsia and polyuria.  Genitourinary:  Negative for dyspareunia, dysuria, flank pain, frequency and urgency.  Musculoskeletal:  Negative for arthralgias, back pain and myalgias.  Skin:  Negative for rash.  Allergic/Immunologic: Negative for environmental allergies.  Neurological:  Negative for dizziness, weakness and headaches.       Confusion.  Hematological:  Negative for adenopathy.  Psychiatric/Behavioral:  The patient is nervous/anxious.      Objective:    Physical Exam Vitals and nursing note reviewed.  Constitutional:      Appearance: Normal appearance. She is well-developed.  HENT:     Head: Normocephalic and atraumatic.     Nose: Nose normal.     Mouth/Throat:     Mouth: Mucous membranes are moist.  Eyes:     Extraocular Movements: Extraocular movements intact.     Conjunctiva/sclera: Conjunctivae normal.     Pupils: Pupils are equal, round, and reactive to light.  Neck:     Vascular: No carotid bruit.  Cardiovascular:     Rate and Rhythm: Normal rate and regular rhythm.     Pulses: Normal pulses.     Heart sounds: Normal heart sounds.  Pulmonary:     Effort: Pulmonary effort is normal.     Breath sounds: Normal breath sounds.  Abdominal:     Palpations: Abdomen is soft.  Genitourinary:    Comments: Urine sample positive for nitrates. Musculoskeletal:        General: Normal range of motion.     Cervical back: Normal range of motion and neck supple.  Lymphadenopathy:     Cervical:  No cervical adenopathy.  Skin:    General: Skin is warm and dry.     Capillary Refill: Capillary refill takes less than 2 seconds.  Neurological:     General: No focal deficit present.     Mental Status: She is alert and oriented to person, place, and time. Mental status is at baseline.  Psychiatric:        Mood and Affect: Mood normal.        Behavior: Behavior normal.        Thought Content: Thought content normal.        Judgment: Judgment normal.    Today's Vitals   10/21/21 1357  BP: 115/65  Pulse: 96  Temp: (!) 97.3 F (36.3 C)  SpO2: 96%  Weight: 137 lb 1.9 oz (62.2 kg)  Height: $Remove'5\' 4"'MDUFZYz$  (1.626 m)   Body mass index is 23.54 kg/m.   Wt Readings from Last 3 Encounters:  10/21/21 137 lb 1.9 oz (62.2 kg)  09/10/21 129 lb (58.5 kg)  09/09/21 134 lb 1.3 oz (60.8 kg)     Health Maintenance Due  Topic Date Due   Pneumonia Vaccine 56+ Years old (1 - PCV) Never done   TETANUS/TDAP  Never done   Zoster Vaccines- Shingrix (1 of 2) Never done   DEXA SCAN  Never done   COVID-19 Vaccine (4 - Booster for Pfizer series) 11/10/2020    There are no preventive care reminders to display for this patient.  Lab Results  Component Value Date   TSH 4.290 10/21/2021   Lab Results  Component Value Date   WBC 6.5 10/21/2021   HGB 13.0 10/21/2021   HCT 39.9 10/21/2021   MCV 88 10/21/2021   PLT 384 10/21/2021   Lab Results  Component  Value Date   NA 141 10/22/2021   K 4.9 10/22/2021   CO2 27 10/22/2021   GLUCOSE 83 10/22/2021   BUN 15 10/22/2021   CREATININE 0.74 10/22/2021   BILITOT 0.2 10/22/2021   ALKPHOS 120 10/22/2021   AST 28 10/22/2021   ALT 24 10/22/2021   PROT 6.8 10/22/2021   ALBUMIN 4.8 (H) 10/22/2021   CALCIUM 9.5 10/22/2021   ANIONGAP 11 07/17/2021   EGFR 80 10/22/2021   Lab Results  Component Value Date   CHOL 153 10/22/2021   Lab Results  Component Value Date   HDL 60 10/22/2021   Lab Results  Component Value Date   LDLCALC 73 10/22/2021    Lab Results  Component Value Date   TRIG 114 10/22/2021   Lab Results  Component Value Date   CHOLHDL 2.6 10/22/2021   Lab Results  Component Value Date   HGBA1C 5.7 (H) 10/22/2021      Assessment & Plan:  1. Urinary tract infection with hematuria, site unspecified Urine sample positive for nitrates today.  Treat with Macrobid twice daily for 10 days.  We will send urine for culture and sensitivity and adjust antibiotics as indicated. - POCT URINALYSIS DIP (CLINITEK) - Urine Culture; Future - Urine Culture  2. Chronic urinary tract infection Will have patient take Macrobid daily after completion of twice daily course.  Refer to urology for further evaluation. - Ambulatory referral to Urology - nitrofurantoin, macrocrystal-monohydrate, (MACROBID) 100 MG capsule; Take 1 capsule po BID for 10 days then take 1 capsule po QD  Dispense: 40 capsule; Refill: 2  3. Acquired hypothyroidism Check thyroid panel for further evaluation.   - TSH - T4, free  4. Confusion Will check vitamin B12, sed rate, CBC, thyroid, RPR and free T4 for further evaluation. Will notify patient and husband of results when they are available.  Patient should also follow-up with neurology as scheduled. - Vitamin B12 - Sedimentation rate - CBC - TSH - Folate - RPR - T4, free  5. Vitamin B12 deficiency Check CBC, vitamin B12 and folate for further evaluation. - Vitamin B12 - CBC - Folate    Problem List Items Addressed This Visit       Endocrine   Hypothyroidism   Relevant Orders   TSH (Completed)   T4, free (Completed)     Nervous and Auditory   Confusion   Relevant Orders   Vitamin B12 (Completed)   Sedimentation rate (Completed)   CBC (Completed)   TSH (Completed)   Folate (Completed)   RPR (Completed)   T4, free (Completed)     Genitourinary   Chronic urinary tract infection - Primary   Relevant Medications   nitrofurantoin, macrocrystal-monohydrate, (MACROBID) 100 MG capsule    Other Relevant Orders   Ambulatory referral to Urology     Other   Vitamin B12 deficiency   Relevant Orders   Vitamin B12 (Completed)   CBC (Completed)   Folate (Completed)    Meds ordered this encounter  Medications   DISCONTD: nitrofurantoin, macrocrystal-monohydrate, (MACROBID) 100 MG capsule    Sig: Take 1 capsule po BID for 10 days then take 1 capsule po QD    Dispense:  20 capsule    Refill:  0    Order Specific Question:   Supervising Provider    Answer:   Beatrice Lecher D [[id number]]   nitrofurantoin, macrocrystal-monohydrate, (MACROBID) 100 MG capsule    Sig: Take 1 capsule po BID for 10 days then take 1 capsule  po QD    Dispense:  40 capsule    Refill:  2    Please disregard first prescription    Order Specific Question:   Supervising Provider    Answer:   Beatrice Lecher D [[id number]]    Follow-up: Return in about 6 weeks (around 12/02/2021) for uti/bp.    Ronnell Freshwater, NP  This note was dictated using Systems analyst. Rapid proofreading was performed to expedite the delivery of the information. Despite proofreading, phonetic errors will occur which are common with this voice recognition software. Please take this into consideration. If there are any concerns, please contact our office.

## 2021-10-21 NOTE — Progress Notes (Deleted)
MyChart Video Visit    Virtual Visit via Video Note   This visit type was conducted due to national recommendations for restrictions regarding the COVID-19 Pandemic (e.g. social distancing) in an effort to limit this patient's exposure and mitigate transmission in our community. This patient is at least at moderate risk for complications without adequate follow up. This format is felt to be most appropriate for this patient at this time. Physical exam was limited by quality of the video and audio technology used for the visit.   Patient location: *** Provider location: ***  I discussed the limitations of evaluation and management by telemedicine and the availability of in person appointments. The patient expressed understanding and agreed to proceed.  Patient: Caroline [name]   DOB: 1936/05/06   86 y.o. Female  MRN: [id number] Visit Date: 10/21/2021  Today's healthcare provider: Carlean Jews, NP   Chief Complaint  Patient presents with   Follow-up   Subjective    HPI  ***   Medications: Outpatient Medications Prior to Visit  Medication Sig   acetaminophen (TYLENOL) 325 MG tablet Take 2 tablets (650 mg total) by mouth every 6 (six) hours as needed for mild pain, moderate pain, fever or headache.   apixaban (ELIQUIS) 5 MG TABS tablet Take 1 tablet (5 mg total) by mouth 2 (two) times daily.   Ascorbic Acid (VITAMIN C) 100 MG tablet Take 1 tablet (100 mg total) by mouth every other day.   atorvastatin (LIPITOR) 80 MG tablet Take 1 tablet (80 mg total) by mouth at bedtime.   Catheters MISC Use as directed. 1 box of inserts and 1 box canisters.   Cholecalciferol (VITAMIN D-3) 25 MCG (1000 UT) CAPS Take 1 capsule (1,000 Units total) by mouth daily.   Coenzyme Q10 (COQ10 PO) Take 1 tablet by mouth daily.   docusate sodium (COLACE) 100 MG capsule Take 2 capsules (200 mg total) by mouth 2 (two) times daily.   furosemide (LASIX) 20 MG tablet Take 1 tablet (20 mg total) by mouth daily  as needed for edema.   irbesartan (AVAPRO) 75 MG tablet Take 1 tablet (75 mg total) by mouth daily.   levothyroxine (SYNTHROID) 125 MCG tablet Take 1 tablet (125 mcg total) by mouth daily at 6 (six) AM.   nitrofurantoin, macrocrystal-monohydrate, (MACROBID) 100 MG capsule Take 1 capsule (100 mg total) by mouth 2 (two) times daily.   Omega-3 Fatty Acids (FISH OIL PO) Take 1,000 mg by mouth daily.   pantoprazole (PROTONIX) 40 MG tablet Take 1 tablet (40 mg total) by mouth 2 (two) times daily before a meal.   Potassium 99 MG TABS Take 99 mg by mouth daily.   QUEtiapine (SEROQUEL) 25 MG tablet Take 1 tablet (25 mg total) by mouth at bedtime.   vitamin B-12 (CYANOCOBALAMIN) 250 MCG tablet Take 1 tablet (250 mcg total) by mouth daily.   No facility-administered medications prior to visit.    Review of Systems  {Labs   Heme   Chem   Endocrine   Serology   Results Review (optional):23779}  Objective    There were no vitals taken for this visit. {Show previous vital signs (optional):23777}  Physical Exam     Assessment & Plan     ***  No follow-ups on file.     I discussed the assessment and treatment plan with the patient. The patient was provided an opportunity to ask questions and all were answered. The patient agreed with the plan and demonstrated  an understanding of the instructions.   The patient was advised to call back or seek an in-person evaluation if the symptoms worsen or if the condition fails to improve as anticipated.  I provided *** minutes of non-face-to-face time during this encounter.  {provider attestation***:1}  Carlean Jews, NP [facility] [phone] (phone) [phone] (fax)  [facility]

## 2021-10-22 ENCOUNTER — Other Ambulatory Visit: Payer: Medicare Other

## 2021-10-22 DIAGNOSIS — Z0000 Encounter for general adult medical examination without abnormal findings: Secondary | ICD-10-CM

## 2021-10-22 LAB — CBC
Hematocrit: 39.9 % (ref 34.0–46.6)
Hemoglobin: 13 g/dL (ref 11.1–15.9)
MCH: 28.6 pg (ref 26.6–33.0)
MCHC: 32.6 g/dL (ref 31.5–35.7)
MCV: 88 fL (ref 79–97)
Platelets: 384 10*3/uL (ref 150–450)
RBC: 4.55 x10E6/uL (ref 3.77–5.28)
RDW: 16.3 % — ABNORMAL HIGH (ref 11.7–15.4)
WBC: 6.5 10*3/uL (ref 3.4–10.8)

## 2021-10-22 LAB — FOLATE: Folate: >20 ng/mL (ref >3.0)

## 2021-10-22 LAB — VITAMIN B12: Vitamin B-12: 989 pg/mL (ref 232–1245)

## 2021-10-22 LAB — SEDIMENTATION RATE: Sed Rate: 7 mm/hr (ref 0–40)

## 2021-10-22 LAB — TSH: TSH: 4.29 u[IU]/mL (ref 0.450–4.500)

## 2021-10-22 LAB — RPR: RPR Ser Ql: NONREACTIVE

## 2021-10-22 LAB — T4, FREE: Free T4: 1.02 ng/dL (ref 0.82–1.77)

## 2021-10-22 NOTE — Progress Notes (Signed)
Please let the patient's husband know that her labs, the blood work, looks very good. All normal. Urine culture will take a fe wmore days. Thanks.

## 2021-10-23 LAB — LIPID PANEL
Chol/HDL Ratio: 2.6 ratio (ref 0.0–4.4)
Cholesterol, Total: 153 mg/dL (ref 100–199)
HDL: 60 mg/dL (ref >39)
LDL Chol Calc (NIH): 73 mg/dL (ref 0–99)
Triglycerides: 114 mg/dL (ref 0–149)
VLDL Cholesterol Cal: 20 mg/dL (ref 5–40)

## 2021-10-23 LAB — COMPREHENSIVE METABOLIC PANEL
ALT: 24 IU/L (ref 0–32)
AST: 28 IU/L (ref 0–40)
Albumin/Globulin Ratio: 2.4 — ABNORMAL HIGH (ref 1.2–2.2)
Albumin: 4.8 g/dL — ABNORMAL HIGH (ref 3.6–4.6)
Alkaline Phosphatase: 120 IU/L (ref 44–121)
BUN/Creatinine Ratio: 20 (ref 12–28)
BUN: 15 mg/dL (ref 8–27)
Bilirubin Total: 0.2 mg/dL (ref 0.0–1.2)
CO2: 27 mmol/L (ref 20–29)
Calcium: 9.5 mg/dL (ref 8.7–10.3)
Chloride: 102 mmol/L (ref 96–106)
Creatinine, Ser: 0.74 mg/dL (ref 0.57–1.00)
Globulin, Total: 2 g/dL (ref 1.5–4.5)
Glucose: 83 mg/dL (ref 70–99)
Potassium: 4.9 mmol/L (ref 3.5–5.2)
Sodium: 141 mmol/L (ref 134–144)
Total Protein: 6.8 g/dL (ref 6.0–8.5)
eGFR: 80 mL/min/{1.73_m2} (ref >59)

## 2021-10-23 LAB — HEMOGLOBIN A1C
Est. average glucose Bld gHb Est-mCnc: 117 mg/dL
Hgb A1c MFr Bld: 5.7 % — ABNORMAL HIGH (ref 4.8–5.6)

## 2021-10-23 LAB — URINE CULTURE

## 2021-10-25 DIAGNOSIS — R410 Disorientation, unspecified: Secondary | ICD-10-CM | POA: Insufficient documentation

## 2021-10-25 DIAGNOSIS — E538 Deficiency of other specified B group vitamins: Secondary | ICD-10-CM | POA: Insufficient documentation

## 2021-10-29 ENCOUNTER — Telehealth: Payer: Self-pay | Admitting: Nurse Practitioner

## 2021-10-29 NOTE — Progress Notes (Signed)
Culture contaminated with more than one organism. She has been placed on p[prophylactic antibiotics and referral to urology was made at recent visit.

## 2021-10-29 NOTE — Progress Notes (Signed)
Please let the patient know that her fasting lipid panel was normal.  Thanks so much.   -HB

## 2021-10-30 ENCOUNTER — Other Ambulatory Visit: Payer: Self-pay | Admitting: Nurse Practitioner

## 2021-10-30 ENCOUNTER — Other Ambulatory Visit: Payer: Self-pay | Admitting: Physician Assistant

## 2021-10-30 DIAGNOSIS — E039 Hypothyroidism, unspecified: Secondary | ICD-10-CM

## 2021-10-30 DIAGNOSIS — I82412 Acute embolism and thrombosis of left femoral vein: Secondary | ICD-10-CM

## 2021-11-03 ENCOUNTER — Other Ambulatory Visit (INDEPENDENT_AMBULATORY_CARE_PROVIDER_SITE_OTHER): Payer: Medicare Other | Admitting: Nurse Practitioner

## 2021-11-03 ENCOUNTER — Other Ambulatory Visit: Payer: Self-pay | Admitting: Gastroenterology

## 2021-11-03 ENCOUNTER — Other Ambulatory Visit: Payer: Self-pay

## 2021-11-03 ENCOUNTER — Telehealth: Payer: Self-pay | Admitting: Nurse Practitioner

## 2021-11-03 ENCOUNTER — Other Ambulatory Visit: Payer: Self-pay | Admitting: Nurse Practitioner

## 2021-11-03 DIAGNOSIS — N3090 Cystitis, unspecified without hematuria: Secondary | ICD-10-CM

## 2021-11-03 LAB — POCT URINALYSIS DIPSTICK
Bilirubin, UA: NEGATIVE
Blood, UA: NEGATIVE
Glucose, UA: NEGATIVE
Ketones, UA: NEGATIVE
Leukocytes, UA: NEGATIVE
Nitrite, UA: POSITIVE
Protein, UA: NEGATIVE
Spec Grav, UA: 1.02 (ref 1.010–1.025)
Urobilinogen, UA: 0.2 E.U./dL
pH, UA: 7.5 (ref 5.0–8.0)

## 2021-11-03 NOTE — Telephone Encounter (Signed)
Herbert Seta,   I called Mr. Cristobal and he said they had completed the two a day treatment yesterday. Advised him to go back to twice a day because of the urine testing positive for nitrites. They will do the twice a day until their urology appointment on 11/06/2021.   Very Respectfully, Chrissie Noa

## 2021-11-03 NOTE — Telephone Encounter (Signed)
Can you find out if the patient is taking the macrobid once or twice daily? If they have moved on to once daily, I would want her to go back to twice daily until her appointment with urology. I will update the prescription to reflect that change once I know. Thanks .

## 2021-11-03 NOTE — Telephone Encounter (Signed)
I know you gave the urine cups to Mr. Rody yesterday. Did he say anything about the symptoms his wife was having?

## 2021-11-03 NOTE — Progress Notes (Addendum)
Patient should be on preventive dose macrobid 100mg  daily. If so, will increase to twice daily until she has initial appointment with urology.  Patient will go back to twice daily dosing. She does have upcoming appointment with urology to evaluate frequent UTI.

## 2021-11-03 NOTE — Telephone Encounter (Signed)
I did not see any documentation where patient had spoken with you yesterday, 11/02/20. Therefore I was unaware of any complaints made by patient or patient spouse. Please insure we are documenting these kinds of encounters in the patients chart so there is no miscommunication. Thank you, AS, CMA

## 2021-11-03 NOTE — Telephone Encounter (Signed)
Patients spouse walked into office with a cup of urine. He states this is his wifes urine and he needs to know if she has a UTI. There has been no communication from Mr. Tomlinson stating that patient was having any symptoms or they were concerned about another UTI.   I asked Mr. Steines what kind of symptoms the patient was having-his answer was "the same"  I advised Mr. Denunzio that in future we would need him to call the office to schedule an Nurse Visit before dropping off a urine. And that we would need some more information on what kind of symptoms Mrs. Temkin was having. - his response was "you all should have more documentation than the government-dont worry, I wont bother you again" and he walked out the door.     I am unsure of what symptoms the patient is having or why a urine specimen is needed.   The specimen has been placed in the lab sink. AS, CMA

## 2021-11-06 ENCOUNTER — Ambulatory Visit (INDEPENDENT_AMBULATORY_CARE_PROVIDER_SITE_OTHER): Payer: Medicare Other | Admitting: Urology

## 2021-11-06 ENCOUNTER — Encounter: Payer: Self-pay | Admitting: Urology

## 2021-11-06 ENCOUNTER — Other Ambulatory Visit: Payer: Self-pay

## 2021-11-06 VITALS — BP 178/87 | HR 98 | Ht 64.0 in | Wt 139.0 lb

## 2021-11-06 DIAGNOSIS — R8271 Bacteriuria: Secondary | ICD-10-CM | POA: Diagnosis not present

## 2021-11-06 DIAGNOSIS — N390 Urinary tract infection, site not specified: Secondary | ICD-10-CM | POA: Diagnosis not present

## 2021-11-06 DIAGNOSIS — N3941 Urge incontinence: Secondary | ICD-10-CM | POA: Diagnosis not present

## 2021-11-06 LAB — MICROSCOPIC EXAMINATION

## 2021-11-06 LAB — URINALYSIS, COMPLETE
Bilirubin, UA: NEGATIVE
Glucose, UA: NEGATIVE
Ketones, UA: NEGATIVE
Leukocytes,UA: NEGATIVE
Nitrite, UA: NEGATIVE
Protein,UA: NEGATIVE
RBC, UA: NEGATIVE
Specific Gravity, UA: 1.015 (ref 1.005–1.030)
Urobilinogen, Ur: 0.2 mg/dL (ref 0.2–1.0)
pH, UA: 7 (ref 5.0–7.5)

## 2021-11-06 LAB — BLADDER SCAN AMB NON-IMAGING: Scan Result: 170

## 2021-11-06 MED ORDER — NITROFURANTOIN MACROCRYSTAL 50 MG PO CAPS: 50.0000 mg | ORAL_CAPSULE | Freq: Every day | ORAL | 0 refills | Status: DC

## 2021-11-06 NOTE — Progress Notes (Signed)
11/06/2021 2:42 PM   Caroline [name] Jun 28, 1936 [id number]  Referring provider: Ronnell Freshwater, NP [place],  [place] [address]  Chief Complaint  Patient presents with   Recurrent UTI    HPI: Caroline [name] is a 86 y.o. female referred for evaluation of bacteriuria.  She presents today with her husband who provided the majority of the history.  Hospitalized late September 2022 after CVA Recently with increased confusion and forgetfulness Urine culture 09/10/2021 grew Enterococcus faecium/E. coli No dysuria, gross hematuria or pelvic pain Follow-up urine culture 10/07/2021 grew Enterobacter cloacae-MDR Started on Macrobid at that time.  A repeat urinalysis 10/21/2021 was nitrite positive and she was continued on antibiotics. No change in her forgetfulness or confusion No dysuria, gross hematuria or bladder pain She does have chronic urinary incontinence which was worse after CVA. Previously seen by Dr. Matilde Sprang at [facility] in [place] and had failed oxybutynin, tolterodine and Myrbetriq.  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 Cheek   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   Bridge Left Nose, and Left Neck   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 Cheek Tx: curret x 3 and 5FU   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   Bridge of nose tx Cx3 and 5FU, Left neck Tx Cx3 and  5FU   Thrombophlebitis     Surgical History: Past Surgical History:  Procedure Laterality Date   ABDOMINAL HYSTERECTOMY  1979   CATARACT EXTRACTION, BILATERAL     knee surgery Left    VARICOSE VEIN SURGERY Left    XI ROBOTIC ASSISTED PARAESOPHAGEAL HERNIA REPAIR N/A 06/15/2021   Procedure: XI ROBOTIC ASSISTED PARAESOPHAGEAL HERNIA REPAIR, GASTROPEXY, UPPER ENDOSCOPY;  Surgeon: Clovis Riley, MD;  Location: [facility];  Service: General;  Laterality: N/A;    Home Medications:  Allergies as of 11/06/2021       Reactions   Gluten Meal Nausea And Vomiting   Patient has CELIAC DISEASE   Wheat Bran Nausea And Vomiting   Patient has CELIAC DISEASE   Adhesive [tape] Other (See Comments)   Patient's skin is VERY THIN and tears very easily; please use either paper tape or Coban wrap   Codeine Nausea And Vomiting   Other Other (See Comments)   Patient has Hypoglycemia; her husband stated "when her sugar crashes, it crashes hard."   Penicillins Other (See Comments)   Doesn't remember reaction , but denies any SOB/Swelling States " it was a long time ago "         Medication List        Accurate as of November 06, 2021  2:42 PM. If you have any questions, ask your nurse or doctor.          acetaminophen  325 MG tablet Commonly known as: TYLENOL Take 2 tablets (650 mg total) by mouth every 6 (six) hours as needed for mild pain, moderate pain, fever or headache.   atorvastatin 80 MG tablet Commonly known as: LIPITOR Take 1 tablet (80 mg total) by mouth at bedtime.   Catheters Misc Use as directed. 1 box of inserts and 1 box canisters.   COQ10 PO Take 1 tablet by mouth daily.   docusate sodium 100 MG capsule Commonly known as: COLACE Take 2 capsules (200 mg total) by mouth 2 (two) times daily.   Eliquis 5 MG Tabs tablet Generic drug: apixaban TAKE 1 TABLET BY MOUTH 2 TIMES DAILY.   furosemide 20 MG tablet Commonly known as: LASIX Take 1 tablet (20 mg total) by mouth  daily as needed for edema.   irbesartan 75 MG tablet Commonly known as: AVAPRO Take 1 tablet (75 mg total) by mouth daily.   levothyroxine 125 MCG tablet Commonly known as: SYNTHROID TAKE 1 TABLET (125 MCG TOTAL) BY MOUTH DAILY AT 6 AM.   nitrofurantoin (macrocrystal-monohydrate) 100 MG capsule Commonly known as: MACROBID Take 1 capsule po BID for 10 days then take 1 capsule po QD   nitrofurantoin 50 MG capsule Commonly known as: MACRODANTIN Take 1 capsule (50 mg total) by mouth daily. Started by: Abbie Sons, MD   QUEtiapine 25 MG tablet Commonly known as: SEROQUEL Take 25 mg by mouth at bedtime.   vitamin B-12 250 MCG tablet Commonly known as: CYANOCOBALAMIN Take 1 tablet (250 mcg total) by mouth daily.   vitamin C 100 MG tablet Take 1 tablet (100 mg total) by mouth every other day.   Vitamin D-3 25 MCG (1000 UT) Caps Take 1 capsule (1,000 Units total) by mouth daily.        Allergies:  Allergies  Allergen Reactions   Gluten Meal Nausea And Vomiting    Patient has CELIAC DISEASE   Wheat Bran Nausea And Vomiting    Patient has CELIAC DISEASE   Adhesive [Tape] Other (See Comments)    Patient's skin is VERY THIN and tears very easily; please use either paper tape or Coban wrap   Codeine Nausea And Vomiting   Other Other (See Comments)    Patient has Hypoglycemia; her husband stated "when her sugar crashes, it crashes hard."   Penicillins Other (See Comments)    Doesn't remember reaction , but denies any SOB/Swelling States " it was a long time ago "     Family History: Family History  Problem Relation Age of Onset   Arthritis Mother        pt denies   Hypertension Mother        pt denies   Obesity Sister    Cancer Maternal Uncle        type unknown   Colon cancer Neg Hx    Stomach cancer Neg Hx    Esophageal cancer Neg Hx    Pancreatic cancer Neg Hx     Social History:  reports that she has never smoked. She has never used smokeless tobacco.  She reports that she does not currently use drugs. She reports that she does not drink alcohol.   Physical Exam: BP (!) 178/87    Pulse 98    Ht 5\' 4"  (1.626 m)    Wt 139 lb (63 kg)    BMI 23.86 kg/m   Constitutional:  Alert, No acute distress. HEENT: [place] AT, moist mucus membranes.  Trachea midline, no  masses. Cardiovascular: No clubbing, cyanosis, or edema. Respiratory: Normal respiratory effort, no increased work of breathing.    Assessment & Plan:    1.  Bacteriuria I suspect her bacteriuria is not related to her cognitive deficits Urinalysis today was clear and urine culture was repeated Rx nitrofurantoin 50 mg daily x30 days was sent to pharmacy Bladder scan PVR slightly elevated at 170 mL which could be a source of her bacteriuria  2.  Urge incontinence Most likely exacerbated after CVA Previously failed multiple medications and underwent PTNS    Abbie Sons, MD  [facility] [address] [place], [place] [address] [phone]

## 2021-11-10 ENCOUNTER — Telehealth: Payer: Self-pay | Admitting: Nurse Practitioner

## 2021-11-10 ENCOUNTER — Other Ambulatory Visit: Payer: Self-pay | Admitting: Nurse Practitioner

## 2021-11-10 DIAGNOSIS — K219 Gastro-esophageal reflux disease without esophagitis: Secondary | ICD-10-CM

## 2021-11-10 MED ORDER — PANTOPRAZOLE SODIUM 40 MG PO TBEC: DELAYED_RELEASE_TABLET | ORAL | 1 refills | Status: DC

## 2021-11-10 NOTE — Telephone Encounter (Signed)
Sent new prescription for pantoprazole 40mg daily to [facility].  °

## 2021-11-10 NOTE — Telephone Encounter (Signed)
Patient requesting refill of pantoprazole. Please advise. [phone]

## 2021-11-10 NOTE — Progress Notes (Signed)
Sent new prescription for pantoprazole 40mg  daily to [facility].

## 2021-11-10 NOTE — Telephone Encounter (Signed)
close

## 2021-11-11 ENCOUNTER — Telehealth: Payer: Self-pay | Admitting: *Deleted

## 2021-11-11 LAB — CULTURE, URINE COMPREHENSIVE

## 2021-11-11 NOTE — Telephone Encounter (Signed)
-----   Message from Riki Altes, MD sent at 11/11/2021 12:46 PM EST ----- Urine culture was negative for infection

## 2021-11-11 NOTE — Telephone Encounter (Signed)
Notified patient as instructed, patient pleased °

## 2021-11-12 ENCOUNTER — Encounter (HOSPITAL_COMMUNITY): Payer: Self-pay | Admitting: Radiology

## 2021-11-19 ENCOUNTER — Ambulatory Visit (INDEPENDENT_AMBULATORY_CARE_PROVIDER_SITE_OTHER): Payer: Medicare Other | Admitting: Nurse Practitioner

## 2021-11-19 ENCOUNTER — Other Ambulatory Visit: Payer: Self-pay

## 2021-11-19 ENCOUNTER — Encounter: Payer: Self-pay | Admitting: Nurse Practitioner

## 2021-11-19 VITALS — BP 155/71 | HR 91 | Temp 97.4°F | Ht 64.0 in | Wt 139.0 lb

## 2021-11-19 DIAGNOSIS — R918 Other nonspecific abnormal finding of lung field: Secondary | ICD-10-CM | POA: Diagnosis not present

## 2021-11-19 DIAGNOSIS — R410 Disorientation, unspecified: Secondary | ICD-10-CM

## 2021-11-19 DIAGNOSIS — L602 Onychogryphosis: Secondary | ICD-10-CM

## 2021-11-19 DIAGNOSIS — N390 Urinary tract infection, site not specified: Secondary | ICD-10-CM | POA: Diagnosis not present

## 2021-11-19 DIAGNOSIS — R32 Unspecified urinary incontinence: Secondary | ICD-10-CM

## 2021-11-19 DIAGNOSIS — E039 Hypothyroidism, unspecified: Secondary | ICD-10-CM

## 2021-11-19 LAB — POCT URINALYSIS DIP (CLINITEK)
Bilirubin, UA: NEGATIVE
Blood, UA: NEGATIVE
Glucose, UA: NEGATIVE mg/dL
Ketones, POC UA: NEGATIVE mg/dL
Leukocytes, UA: NEGATIVE
Nitrite, UA: NEGATIVE
POC PROTEIN,UA: NEGATIVE
Spec Grav, UA: 1.02 (ref 1.010–1.025)
Urobilinogen, UA: 0.2 E.U./dL
pH, UA: 7 (ref 5.0–8.0)

## 2021-11-19 NOTE — Progress Notes (Signed)
Established patient visit   Patient: Caroline [name]   DOB: 20-Jun-1936   86 y.o. Female  MRN: [id number] Visit Date: 11/19/2021   Chief Complaint  Patient presents with   Follow-up   Subjective    HPI  The patient presents with her husband.  She is scheduled to have a recheck of urine.  Has been on prophylactic antibiotics.  Continues to have confusion and showing signs of dementia.  She does see neurology.  Has had chronic urinary tract infection.  This has improved on prophylactic antibiotics. Patient's husband received a letter in the mail from patient's hospitalization prior to her being a patient here.  He did mention CT scan that was done during her hospitalization that warranted follow-up.  Went back and reviewed CT scan which was done 07/17/2021.  This did show mild mediastinal and right hilar/infrahilar adenopathy. Possibly reactive and can be seen in the setting of fluid overload. Recommend contrast enhanced chest CT follow-up at 3 months to confirm stability or resolution.  At that time, patient had been hospitalized for pneumonia and then went back to ER for chest pain following the hospitalization.  No follow-up CT had been done to this point. The patient also has ingrowing toenails of the great toes.  Her husband requests referral to podiatry for routine foot care.  Medications: Outpatient Medications Prior to Visit  Medication Sig   acetaminophen (TYLENOL) 325 MG tablet Take 2 tablets (650 mg total) by mouth every 6 (six) hours as needed for mild pain, moderate pain, fever or headache.   Ascorbic Acid (VITAMIN C) 100 MG tablet Take 1 tablet (100 mg total) by mouth every other day.   atorvastatin (LIPITOR) 80 MG tablet Take 1 tablet (80 mg total) by mouth at bedtime.   Catheters MISC Use as directed. 1 box of inserts and 1 box canisters.   Cholecalciferol (VITAMIN D-3) 25 MCG (1000 UT) CAPS Take 1 capsule (1,000 Units total) by mouth daily.   Coenzyme Q10 (COQ10 PO) Take 1 tablet by  mouth daily.   docusate sodium (COLACE) 100 MG capsule Take 2 capsules (200 mg total) by mouth 2 (two) times daily.   ELIQUIS 5 MG TABS tablet TAKE 1 TABLET BY MOUTH 2 TIMES DAILY.   furosemide (LASIX) 20 MG tablet Take 1 tablet (20 mg total) by mouth daily as needed for edema.   irbesartan (AVAPRO) 75 MG tablet Take 1 tablet (75 mg total) by mouth daily.   levothyroxine (SYNTHROID) 125 MCG tablet TAKE 1 TABLET (125 MCG TOTAL) BY MOUTH DAILY AT 6 AM.   nitrofurantoin (MACRODANTIN) 50 MG capsule Take 1 capsule (50 mg total) by mouth daily.   nitrofurantoin, macrocrystal-monohydrate, (MACROBID) 100 MG capsule Take 1 capsule po BID for 10 days then take 1 capsule po QD   pantoprazole (PROTONIX) 40 MG tablet Take 1 tablet po QD   QUEtiapine (SEROQUEL) 25 MG tablet Take 25 mg by mouth at bedtime.   vitamin B-12 (CYANOCOBALAMIN) 250 MCG tablet Take 1 tablet (250 mcg total) by mouth daily.   No facility-administered medications prior to visit.    Review of Systems  Constitutional:  Positive for activity change and fatigue. Negative for appetite change, chills and fever.  HENT:  Negative for congestion, postnasal drip, rhinorrhea, sinus pressure, sinus pain, sneezing and sore throat.   Eyes: Negative.   Respiratory:  Negative for cough, chest tightness, shortness of breath and wheezing.   Cardiovascular:  Negative for chest pain and palpitations.  Gastrointestinal:  Negative for  abdominal pain, constipation, diarrhea, nausea and vomiting.  Endocrine: Negative for cold intolerance, heat intolerance, polydipsia and polyuria.  Genitourinary:  Negative for dyspareunia, dysuria, flank pain, frequency and urgency.       Recurrent UTI  Musculoskeletal:  Negative for arthralgias, back pain and myalgias.  Skin:  Negative for rash.  Allergic/Immunologic: Negative for environmental allergies.  Neurological:  Positive for weakness. Negative for dizziness and headaches.       Confusion, signs of dementia  present.  Hematological:  Negative for adenopathy.  Psychiatric/Behavioral:  Positive for dysphoric mood. The patient is nervous/anxious.    Last CBC Lab Results  Component Value Date   WBC 6.5 10/21/2021   HGB 13.0 10/21/2021   HCT 39.9 10/21/2021   MCV 88 10/21/2021   MCH 28.6 10/21/2021   RDW 16.3 (H) 10/21/2021   PLT 384 85/63/1497   Last metabolic panel Lab Results  Component Value Date   GLUCOSE 83 10/22/2021   NA 141 10/22/2021   K 4.9 10/22/2021   CL 102 10/22/2021   CO2 27 10/22/2021   BUN 15 10/22/2021   CREATININE 0.74 10/22/2021   EGFR 80 10/22/2021   CALCIUM 9.5 10/22/2021   PROT 6.8 10/22/2021   ALBUMIN 4.8 (H) 10/22/2021   LABGLOB 2.0 10/22/2021   AGRATIO 2.4 (H) 10/22/2021   BILITOT 0.2 10/22/2021   ALKPHOS 120 10/22/2021   AST 28 10/22/2021   ALT 24 10/22/2021   ANIONGAP 11 07/17/2021   Last hemoglobin A1c Lab Results  Component Value Date   HGBA1C 5.7 (H) 10/22/2021   Last thyroid functions Lab Results  Component Value Date   TSH 4.290 10/21/2021   T3TOTAL 75 09/09/2021   T4TOTAL 10.3 09/09/2021   Last vitamin B12 and Folate Lab Results  Component Value Date   VITAMINB12 989 10/21/2021   FOLATE >20.0 10/21/2021       Objective     Today's Vitals   11/19/21 1514  BP: (!) 155/71  Pulse: 91  Temp: (!) 97.4 F (36.3 C)  SpO2: 96%  Weight: 139 lb (63 kg)  Height: $Remove'5\' 4"'JQXiost$  (1.626 m)   Body mass index is 23.86 kg/m.   BP Readings from Last 3 Encounters:  11/19/21 (!) 155/71  11/06/21 (!) 178/87  10/21/21 115/65    Wt Readings from Last 3 Encounters:  11/19/21 139 lb (63 kg)  11/06/21 139 lb (63 kg)  10/21/21 137 lb 1.9 oz (62.2 kg)    Physical Exam Vitals and nursing note reviewed.  Constitutional:      Appearance: Normal appearance. She is well-developed.  HENT:     Head: Normocephalic and atraumatic.     Nose: Nose normal.  Eyes:     Pupils: Pupils are equal, round, and reactive to light.  Cardiovascular:     Rate  and Rhythm: Normal rate and regular rhythm.     Pulses: Normal pulses.     Heart sounds: Normal heart sounds.  Pulmonary:     Effort: Pulmonary effort is normal.     Breath sounds: Normal breath sounds.  Abdominal:     Palpations: Abdomen is soft.  Genitourinary:    Comments: Urine sample is negative for any acute abnormalities today. Musculoskeletal:        General: Normal range of motion.     Cervical back: Normal range of motion and neck supple.  Lymphadenopathy:     Cervical: No cervical adenopathy.  Skin:    General: Skin is warm and dry.  Capillary Refill: Capillary refill takes less than 2 seconds.  Neurological:     General: No focal deficit present.     Mental Status: She is alert. Mental status is at baseline. She is disoriented.  Psychiatric:        Mood and Affect: Mood normal.        Behavior: Behavior normal.        Thought Content: Thought content normal.        Judgment: Judgment normal.     Results for orders placed or performed in visit on 11/19/21  POCT URINALYSIS DIP (CLINITEK)  Result Value Ref Range   Color, UA yellow yellow   Clarity, UA clear clear   Glucose, UA negative negative mg/dL   Bilirubin, UA negative negative   Ketones, POC UA negative negative mg/dL   Spec Grav, UA 1.020 1.010 - 1.025   Blood, UA negative negative   pH, UA 7.0 5.0 - 8.0   POC PROTEIN,UA negative negative, trace   Urobilinogen, UA 0.2 0.2 or 1.0 E.U./dL   Nitrite, UA Negative Negative   Leukocytes, UA Negative Negative    Assessment & Plan    1. Chronic urinary tract infection Urine sample negative for infection today.  Patient to continue following with urology as scheduled.  2. Urinary incontinence, unspecified type Urine sample showing no evidence of infection.  Continue to see urology as scheduled. - POCT URINALYSIS DIP (CLINITEK)  3. Multiple pulmonary nodules Reviewed CT angio chest done 07/17/2021.  Did show multiple pulmonary nodules, possibly related  to diagnosis of pneumonia.  Will order CT chest lung nodule follow-up for further evaluation. - CT CHEST WO CONTRAST; Future  4. Hypertrophic toenail Referral to podiatry for further evaluation and foot care. - Ambulatory referral to Podiatry  5. Acquired hypothyroidism Thyroid panel stable.  Continue levothyroxine as prescribed.  6. Confusion Patient to follow-up with neurology as scheduled.  Problem List Items Addressed This Visit       Endocrine   Hypothyroidism     Nervous and Auditory   Confusion     Musculoskeletal and Integument   Hypertrophic toenail   Relevant Orders   Ambulatory referral to Podiatry     Genitourinary   Chronic urinary tract infection - Primary     Other   Urinary incontinence   Relevant Orders   POCT URINALYSIS DIP (CLINITEK) (Completed)   Multiple pulmonary nodules   Relevant Orders   CT CHEST WO CONTRAST     Return in about 2 months (around 01/17/2022) for medicare wellness.         Ronnell Freshwater, NP  [facility] [phone] (phone) [phone] (fax)  [place]

## 2021-11-25 ENCOUNTER — Encounter: Payer: Self-pay | Admitting: Podiatry

## 2021-11-25 ENCOUNTER — Ambulatory Visit: Payer: Medicare Other | Admitting: Podiatry

## 2021-11-25 ENCOUNTER — Other Ambulatory Visit: Payer: Self-pay

## 2021-11-25 DIAGNOSIS — M79675 Pain in left toe(s): Secondary | ICD-10-CM

## 2021-11-25 DIAGNOSIS — B351 Tinea unguium: Secondary | ICD-10-CM | POA: Diagnosis not present

## 2021-11-25 DIAGNOSIS — D689 Coagulation defect, unspecified: Secondary | ICD-10-CM | POA: Diagnosis not present

## 2021-11-25 DIAGNOSIS — M79674 Pain in right toe(s): Secondary | ICD-10-CM

## 2021-11-25 NOTE — Progress Notes (Signed)
Subjective:   Patient ID: Caroline [name], female   DOB: 86 y.o.   MRN: [id number]   HPI Patient presents with caregiver with significant nail disease of all nails and presents with inability to cut them herself and states they get sore in the corners and she is on a blood thinner and not take care of herself   Review of Systems  All other systems reviewed and are negative.      Objective:  Physical Exam Vitals and nursing note reviewed.  Constitutional:      Appearance: She is well-developed.  Pulmonary:     Effort: Pulmonary effort is normal.  Musculoskeletal:        General: Normal range of motion.  Skin:    General: Skin is warm.  Neurological:     Mental Status: She is alert.  Patient presents with pain and thickness of the nailbeds 1-5 both feet that are incurvated in the corners and has good neurovascular flow good muscle strength and range of motion moderately diminished subtalar midtarsal joint.  Good digital perfusion noted well oriented      Assessment:  Mycotic toenail infection with pain 1-5 both feet     Plan:  H&P reviewed condition and went ahead today and debrided nailbeds 1-5 both feet no iatrogenic bleeding reappoint for routine care and daily inspections encouraged

## 2021-11-29 DIAGNOSIS — R32 Unspecified urinary incontinence: Secondary | ICD-10-CM | POA: Insufficient documentation

## 2021-11-29 DIAGNOSIS — L602 Onychogryphosis: Secondary | ICD-10-CM | POA: Insufficient documentation

## 2021-11-29 DIAGNOSIS — R918 Other nonspecific abnormal finding of lung field: Secondary | ICD-10-CM | POA: Insufficient documentation

## 2021-12-01 ENCOUNTER — Telehealth: Payer: Self-pay | Admitting: Urology

## 2021-12-03 ENCOUNTER — Telehealth: Payer: Self-pay | Admitting: Urology

## 2021-12-03 DIAGNOSIS — N390 Urinary tract infection, site not specified: Secondary | ICD-10-CM

## 2021-12-03 NOTE — Telephone Encounter (Signed)
Pt would like a refill on Nitrofurantoin (Macrodantin) and would like it to be sent to [facility] in [place], [place]. ?

## 2021-12-04 ENCOUNTER — Emergency Department (HOSPITAL_COMMUNITY): Payer: Medicare Other

## 2021-12-04 ENCOUNTER — Encounter (HOSPITAL_COMMUNITY): Payer: Self-pay

## 2021-12-04 ENCOUNTER — Other Ambulatory Visit: Payer: Self-pay

## 2021-12-04 ENCOUNTER — Emergency Department (HOSPITAL_COMMUNITY)
Admission: EM | Admit: 2021-12-04 | Discharge: 2021-12-04 | Disposition: A | Discharge status: Home / Self Care | Payer: Medicare Other | Attending: Emergency Medicine | Admitting: Emergency Medicine

## 2021-12-04 DIAGNOSIS — H53461 Homonymous bilateral field defects, right side: Secondary | ICD-10-CM | POA: Insufficient documentation

## 2021-12-04 DIAGNOSIS — Z7901 Long term (current) use of anticoagulants: Secondary | ICD-10-CM | POA: Diagnosis not present

## 2021-12-04 DIAGNOSIS — Z79899 Other long term (current) drug therapy: Secondary | ICD-10-CM | POA: Insufficient documentation

## 2021-12-04 DIAGNOSIS — R112 Nausea with vomiting, unspecified: Secondary | ICD-10-CM | POA: Diagnosis not present

## 2021-12-04 DIAGNOSIS — R42 Dizziness and giddiness: Secondary | ICD-10-CM | POA: Insufficient documentation

## 2021-12-04 DIAGNOSIS — I10 Essential (primary) hypertension: Secondary | ICD-10-CM | POA: Diagnosis not present

## 2021-12-04 DIAGNOSIS — R519 Headache, unspecified: Secondary | ICD-10-CM | POA: Insufficient documentation

## 2021-12-04 LAB — BASIC METABOLIC PANEL
Anion gap: 11 (ref 5–15)
BUN: 12 mg/dL (ref 8–23)
CO2: 25 mmol/L (ref 22–32)
Calcium: 9.2 mg/dL (ref 8.9–10.3)
Chloride: 103 mmol/L (ref 98–111)
Creatinine, Ser: 0.67 mg/dL (ref 0.44–1.00)
GFR, Estimated: >60 mL/min (ref >60)
Glucose, Bld: 102 mg/dL — ABNORMAL HIGH (ref 70–99)
Potassium: 3.7 mmol/L (ref 3.5–5.1)
Sodium: 139 mmol/L (ref 135–145)

## 2021-12-04 LAB — CBC WITH DIFFERENTIAL/PLATELET
Abs Immature Granulocytes: 0.02 10*3/uL (ref 0.00–0.07)
Basophils Absolute: 0.1 10*3/uL (ref 0.0–0.1)
Basophils Relative: 1 %
Eosinophils Absolute: 0.2 10*3/uL (ref 0.0–0.5)
Eosinophils Relative: 2 %
HCT: 42.2 % (ref 36.0–46.0)
Hemoglobin: 13.6 g/dL (ref 12.0–15.0)
Immature Granulocytes: 0 %
Lymphocytes Relative: 26 %
Lymphs Abs: 1.8 10*3/uL (ref 0.7–4.0)
MCH: 28.9 pg (ref 26.0–34.0)
MCHC: 32.2 g/dL (ref 30.0–36.0)
MCV: 89.8 fL (ref 80.0–100.0)
Monocytes Absolute: 0.5 10*3/uL (ref 0.1–1.0)
Monocytes Relative: 7 %
Neutro Abs: 4.4 10*3/uL (ref 1.7–7.7)
Neutrophils Relative %: 64 %
Platelets: 322 10*3/uL (ref 150–400)
RBC: 4.7 MIL/uL (ref 3.87–5.11)
RDW: 17.2 % — ABNORMAL HIGH (ref 11.5–15.5)
WBC: 7 10*3/uL (ref 4.0–10.5)
nRBC: 0 % (ref 0.0–0.2)

## 2021-12-04 LAB — CBG MONITORING, ED: Glucose-Capillary: 98 mg/dL (ref 70–99)

## 2021-12-04 LAB — TROPONIN I (HIGH SENSITIVITY): Troponin I (High Sensitivity): 9 ng/L (ref <18)

## 2021-12-04 MED ORDER — ONDANSETRON HCL 4 MG PO TABS: 4.0000 mg | ORAL_TABLET | Freq: Four times a day (QID) | ORAL | 0 refills | Status: AC

## 2021-12-04 MED ORDER — ONDANSETRON HCL 4 MG/2ML IJ SOLN
4.0000 mg | Freq: Once | INTRAMUSCULAR | Status: AC
  Administered 2021-12-04: 4 mg via INTRAVENOUS
  Filled 2021-12-04: qty 2

## 2021-12-04 MED ORDER — MECLIZINE HCL 25 MG PO TABS
12.5000 mg | ORAL_TABLET | Freq: Once | ORAL | Status: AC
Start: 2021-12-04 — End: 2021-12-04
  Administered 2021-12-04: 12.5 mg via ORAL
  Filled 2021-12-04: qty 1

## 2021-12-04 MED ORDER — NITROFURANTOIN MONOHYD MACRO 100 MG PO CAPS: 100.0000 mg | ORAL_CAPSULE | Freq: Every day | ORAL | 0 refills | Status: DC

## 2021-12-04 MED ORDER — MECLIZINE HCL 25 MG PO TABS: 12.5000 mg | ORAL_TABLET | Freq: Three times a day (TID) | ORAL | 0 refills | Status: AC | PRN

## 2021-12-04 MED ORDER — ACETAMINOPHEN 325 MG PO TABS
650.0000 mg | ORAL_TABLET | Freq: Once | ORAL | Status: AC
  Administered 2021-12-04: 650 mg via ORAL
  Filled 2021-12-04: qty 2

## 2021-12-04 NOTE — ED Provider Notes (Signed)
Medical screening examination/treatment/procedure(s) were conducted as a shared visit with non-physician practitioner(s) and myself.  I personally evaluated the patient during the encounter. ? ?Clinical Impression:  ? ?Final diagnoses:  ?Dizziness  ? ? ?This is a very pleasant 85 year old female presenting with acute vertigo which occurred this morning when she woke up, when she tried to get out of bed she was acutely nauseated vomiting and feeling like the room was spinning.  The patient is having significant improvement at this time when she is laying perfectly still but this is inducible upon movement around the room.  She will states that she is still dizzy even when she is not moving however she does not have any nausea or vomiting.  On my exam she does have focal neurodeficits with a right homonymous hemianopsia, this has been noted on neurologic exams in the past including physical medicine and rehab as well as neurology back in November when she followed up after having her large vessel stroke in September which required a several week stay in rehab.  She is currently on oral anticoagulants for her history of DVT and prior ischemic event. ? ?On my exam the patient does have them on his hemianopsia, she does not have any weakness of the arms of the legs.  Her finger-nose-finger is impaired only by the visual ability to identify the finger, there is no dysmetria despite that. ? ?Mental status is awake and alert, she is awaiting her husband's arrival for further information.  Blood pressure 166/69 ?  ?Eber Hong, MD ?12/06/21 [phone] ? ?

## 2021-12-04 NOTE — ED Notes (Signed)
Attempted to obtain urine sample; pt unable to provide at this time ?

## 2021-12-04 NOTE — Telephone Encounter (Signed)
Looks like she has a follow-up scheduled on 3/10.  Can refill for 1 week ?

## 2021-12-04 NOTE — ED Notes (Signed)
Pt transported to MRI 

## 2021-12-04 NOTE — ED Triage Notes (Addendum)
Bib ems from home; woke this am 0700, states when she sit or stands she feels like the room is spinning, resolves with lying down; hx same but not diagnosed; endorses HA, double vision, nausea; hx stroke, passed stroke screen with ems; hx htn, 180/90s, no am meds today; 178/88, HR 100, 99% RA, RR 18, 98.73F ?

## 2021-12-04 NOTE — ED Provider Notes (Signed)
[facility] EMERGENCY DEPARTMENT Provider Note   CSN: [id number] Arrival date & time: 12/04/21  [id number]     History  Chief Complaint  Patient presents with   Dizziness    Caroline [name] is a 86 y.o. female. With past medical history of HTN, CVA 2019, who presents to the emergency department with dizziness.  Patient states she woke up this morning around 0730 with dizziness. States it was present immediately after waking up. She states her husband helped her to the bathroom but after a few steps felt as if she was going to fall so went back to bed. States she began having nausea with one episode of vomiting. States she continues to have persistent nausea and vomits at bedside during exam. She endorses mild frontal headache and possible blurriness to her vision. Most recent fall was 1.5 weeks ago and she denies hitting her head or neck. Denies other recent trauma to her head or neck. She denies chest pain, shortness of breath or palpitations. Denies numbness or tingling to face or extremities. Anticoagulated on Eliquis.    Dizziness Associated symptoms: headaches, nausea and vomiting   Associated symptoms: no chest pain, no palpitations and no shortness of breath       Home Medications Prior to Admission medications   Medication Sig Start Date End Date Taking? Authorizing Provider  acetaminophen (TYLENOL) 325 MG tablet Take 2 tablets (650 mg total) by mouth every 6 (six) hours as needed for mild pain, moderate pain, fever or headache. 07/02/21   Angiulli, Lavon Paganini, PA-C  Ascorbic Acid (VITAMIN C) 100 MG tablet Take 1 tablet (100 mg total) by mouth every other day. 07/02/21   Angiulli, Lavon Paganini, PA-C  atorvastatin (LIPITOR) 80 MG tablet Take 1 tablet (80 mg total) by mouth at bedtime. 08/04/21   Lorrene Reid, PA-C  Catheters MISC Use as directed. 1 box of inserts and 1 box canisters. 08/04/21   Lorrene Reid, PA-C  Cholecalciferol (VITAMIN D-3) 25 MCG (1000 UT) CAPS Take 1  capsule (1,000 Units total) by mouth daily. 07/02/21   Angiulli, Lavon Paganini, PA-C  Coenzyme Q10 (COQ10 PO) Take 1 tablet by mouth daily.    [provider]  docusate sodium (COLACE) 100 MG capsule Take 2 capsules (200 mg total) by mouth 2 (two) times daily. 07/02/21   Angiulli, Daniel J, PA-C  ELIQUIS 5 MG TABS tablet TAKE 1 TABLET BY MOUTH 2 TIMES DAILY. 10/30/21   Ronnell Freshwater, NP  furosemide (LASIX) 20 MG tablet Take 1 tablet (20 mg total) by mouth daily as needed for edema. 07/28/21   Lorrene Reid, PA-C  irbesartan (AVAPRO) 75 MG tablet Take 1 tablet (75 mg total) by mouth daily. 07/07/21   Angiulli, Lavon Paganini, PA-C  levothyroxine (SYNTHROID) 125 MCG tablet TAKE 1 TABLET (125 MCG TOTAL) BY MOUTH DAILY AT 6 AM. 10/30/21   Ronnell Freshwater, NP  nitrofurantoin (MACRODANTIN) 50 MG capsule Take 1 capsule (50 mg total) by mouth daily. 11/06/21   Stoioff, Ronda Fairly, MD  nitrofurantoin, macrocrystal-monohydrate, (MACROBID) 100 MG capsule Take 1 capsule po BID for 10 days then take 1 capsule po QD 10/21/21   Ronnell Freshwater, NP  pantoprazole (PROTONIX) 40 MG tablet Take 1 tablet po QD 11/10/21   Ronnell Freshwater, NP  QUEtiapine (SEROQUEL) 25 MG tablet Take 25 mg by mouth at bedtime. 10/30/21   [provider]  vitamin B-12 (CYANOCOBALAMIN) 250 MCG tablet Take 1 tablet (250 mcg total) by mouth daily.  07/02/21   Angiulli, Lavon Paganini, PA-C      Allergies    Gluten meal, Wheat bran, Adhesive [tape], Codeine, Other, and Penicillins    Review of Systems   Review of Systems  Constitutional:  Negative for fever.  Respiratory:  Negative for shortness of breath.   Cardiovascular:  Negative for chest pain and palpitations.  Gastrointestinal:  Positive for nausea and vomiting. Negative for abdominal pain.  Neurological:  Positive for dizziness and headaches. Negative for syncope.  Psychiatric/Behavioral:  Negative for confusion.   All other systems reviewed and are negative.  Physical  Exam Updated Vital Signs BP (!) 153/87    Pulse 97    Temp 98.1 F (36.7 C) (Oral)    Resp 14    Ht 5\' 7"  (1.702 m)    Wt 63 kg    SpO2 97%    BMI 21.75 kg/m  Physical Exam Vitals and nursing note reviewed.  Constitutional:      Appearance: She is ill-appearing.  HENT:     Head: Normocephalic and atraumatic.     Nose: Nose normal.     Mouth/Throat:     Mouth: Mucous membranes are moist.     Pharynx: Oropharynx is clear.  Eyes:     General: Visual field deficit present. No scleral icterus.    Extraocular Movements: Extraocular movements intact.     Conjunctiva/sclera: Conjunctivae normal.     Pupils: Pupils are equal, round, and reactive to light.  Cardiovascular:     Rate and Rhythm: Normal rate and regular rhythm.     Pulses: Normal pulses.     Heart sounds: Normal heart sounds. No murmur heard. Pulmonary:     Effort: Pulmonary effort is normal. No respiratory distress.     Breath sounds: Normal breath sounds.  Abdominal:     General: Bowel sounds are normal. There is no distension.     Palpations: Abdomen is soft.     Tenderness: There is no abdominal tenderness.  Musculoskeletal:        General: Normal range of motion.     Cervical back: Normal range of motion and neck supple. No rigidity or tenderness.  Skin:    General: Skin is warm and dry.     Capillary Refill: Capillary refill takes less than 2 seconds.  Neurological:     General: No focal deficit present.     Mental Status: She is alert and oriented to person, place, and time.     Sensory: Sensation is intact.     Motor: Motor function is intact. No weakness.     Coordination: Finger-Nose-Finger Test abnormal. Heel to Shin Test normal. Impaired rapid alternating movements.     Comments: Right-sided homonymous hemianopsia  Slowed RAM with finger movement. RAM of hands intact   HINTS H-HIT: abnormal  Nystagmus: no nystagmus Test of skew: absent skew deviation  Psychiatric:        Mood and Affect: Mood  normal.        Behavior: Behavior normal.        Thought Content: Thought content normal.        Judgment: Judgment normal.    ED Results / Procedures / Treatments   Labs (all labs ordered are listed, but only abnormal results are displayed) Labs Reviewed  BASIC METABOLIC PANEL - Abnormal; Notable for the following components:      Result Value   Glucose, Bld 102 (*)    All other components within normal limits  CBC WITH  DIFFERENTIAL/PLATELET - Abnormal; Notable for the following components:   RDW 17.2 (*)    All other components within normal limits  URINALYSIS, ROUTINE W REFLEX MICROSCOPIC  CBG MONITORING, ED  TROPONIN I (HIGH SENSITIVITY)   EKG EKG Interpretation  Date/Time:  Friday December 04 2021 09:54:40 EST Ventricular Rate:  93 PR Interval:  187 QRS Duration: 77 QT Interval:  386 QTC Calculation: 481 R Axis:   65 Text Interpretation: Sinus rhythm Confirmed by Noemi Chapel [phone]) on 12/04/2021 10:12:48 AM  Radiology CT Head Wo Contrast  Result Date: 12/04/2021 CLINICAL DATA:  Dizziness EXAM: CT HEAD WITHOUT CONTRAST TECHNIQUE: Contiguous axial images were obtained from the base of the skull through the vertex without intravenous contrast. RADIATION DOSE REDUCTION: This exam was performed according to the departmental dose-optimization program which includes automated exposure control, adjustment of the mA and/or kV according to patient size and/or use of iterative reconstruction technique. COMPARISON:  CT head 07/04/2021, MR head 06/18/2021 FINDINGS: Brain: There is no evidence of acute intracranial hemorrhage, extra-axial fluid collection, or acute infarct. There is a background of mild global parenchymal volume loss with prominence of the ventricular system and extra-axial CSF spaces. The ventricles appear increased in size compared to the study from 07/04/2021, in part due to ex vacuo dilatation of the left occipital horn related to the remote PCA territory infarct;  however, normal pressure hydrocephalus could have a similar appearance. Confluent hypodensity in the subcortical and periventricular white matter is again seen, likely reflecting sequela of advanced chronic white matter microangiopathy. Additional remote infarcts are seen in the right thalamus and left basal ganglia. There is no mass lesion.  There is no mass effect or midline shift. Vascular: There is calcification of the bilateral cavernous ICAs. Skull: Normal. Negative for fracture or focal lesion. Sinuses/Orbits: The imaged paranasal sinuses are clear. Bilateral lens implants are in place. The globes and orbits are otherwise unremarkable. Other: None. IMPRESSION: 1. Remote infarct in the left PCA territory and remote lacunar infarcts in the right thalamus and left basal ganglia as above. 2. Dilation of the ventricular system which has increased since 07/04/2021. This is likely in part due to increased ex vacuo dilatation related to the PCA infarcts; however, normal pressure hydrocephalus could have a similar appearance. 3. No acute infarct or hemorrhage. Electronically Signed   By: Valetta Mole M.D.   On: 12/04/2021 11:00   MR Brain Wo Contrast (neuro protocol)  Result Date: 12/04/2021 CLINICAL DATA:  Dizziness, vertigo EXAM: MRI HEAD WITHOUT CONTRAST TECHNIQUE: Multiplanar, multiecho pulse sequences of the brain and surrounding structures were obtained without intravenous contrast. COMPARISON:  Same-day CT head, MR head 06/18/2021 FINDINGS: Brain: There is no evidence of acute intracranial hemorrhage, extra-axial fluid collection, or acute infarct. Again seen is a background of mild global parenchymal volume loss. There is prominence of the ventricular system which again appears increased compared to the head CT from 07/04/2021. There is a remote infarct in the left PCA distribution with overlying T1 hyperintensity likely reflecting cortical laminar necrosis. There is associated ex vacuo dilatation of the  left lateral ventricle. Additional small remote lacunar infarcts are seen in the right thalamus and left basal ganglia. Confluent hypodensity in the remainder of the subcortical and periventricular white matter likely reflects sequela of advanced chronic white matter microangiopathy. There is no mass lesion.  There is no midline shift. Vascular: Normal flow voids. Skull and upper cervical spine: Normal marrow signal. Sinuses/Orbits: The paranasal sinuses are clear. Bilateral lens implants are  in place. The globes and orbits are otherwise unremarkable. Other: None. IMPRESSION: 1. No acute intracranial pathology. 2. Remote infarct in the left PCA territory and small remote lacunar infarcts on a background of advanced chronic white matter microangiopathy. 3. Unchanged dilation of the ventricular system which may reflect central greater than peripheral volume loss, but normal pressure hydrocephalus could have a similar appearance. Electronically Signed   By: Valetta Mole M.D.   On: 12/04/2021 12:16    Procedures Procedures   Medications Ordered in ED Medications  acetaminophen (TYLENOL) tablet 650 mg (has no administration in time range)  meclizine (ANTIVERT) tablet 12.5 mg (has no administration in time range)  ondansetron (ZOFRAN) injection 4 mg (4 mg Intravenous Given 12/04/21 1005)    ED Course/ Medical Decision Making/ A&P                           Medical Decision Making Amount and/or Complexity of Data Reviewed Labs: ordered. Radiology: ordered.  Risk OTC drugs. Prescription drug management.  Patient presents to the ED with complaints of dizziness. This involves an extensive number of treatment options, and is a complaint that carries with it a high risk of complications and morbidity.   Additional history obtained:  Additional history obtained from: husband External records from outside source obtained and reviewed including: previous neurology visits  EKG: EKG: normal EKG, normal  sinus rhythm.   Cardiac Monitoring: The patient was maintained on a cardiac monitor.  I personally viewed and interpreted the cardiac monitored which showed an underlying rhythm of: sinus rhythm  Lab Results: I personally ordered, reviewed, and interpreted labs. Pertinent results include: CBC: no leukocytosis, anemia  CBG: 98 BMP: within normal limits   Imaging Studies ordered:  I ordered imaging studies which included CT and MRI.  I independently reviewed & interpreted imaging & am in agreement with radiology impression. Imaging shows: CT Head: IMPRESSION:  1. Remote infarct in the left PCA territory and remote lacunar  infarcts in the right thalamus and left basal ganglia as above.  2. Dilation of the ventricular system which has increased since  07/04/2021. This is likely in part due to increased ex vacuo  dilatation related to the PCA infarcts; however, normal pressure  hydrocephalus could have a similar appearance.  3. No acute infarct or hemorrhage   MR Brain Wo Contrast:  IMPRESSION:  1. No acute intracranial pathology.  2. Remote infarct in the left PCA territory and small remote lacunar  infarcts on a background of advanced chronic white matter  microangiopathy.  3. Unchanged dilation of the ventricular system which may reflect  central greater than peripheral volume loss, but normal pressure  hydrocephalus could have a similar appearance   Medications  I ordered medication including Zofran for nausea, Antivert for dizziness, tylenol for headache. Reevaluation of the patient after medication shows that patient improved  ED Course: 86 year old female who presents to the emergency department with dizziness. Differential: BPPV, vestibular neuritis, acute stroke, dissection, migraine, hypoglycemia  Dizziness with peripheral field deficit and slowed finger to nose with difficulty on the right. She does have history of CVA with previously documented right-side homonymous  hemianopsia. This is present. Given concern for new CVA given acute onset dizziness with vomiting obtained CT head which shows no acute infarct or hemorrhage. MR Brain without showing no acute infarct.  Dizziness - CT head negative for acute infarct - MR Brain wo contrast negative for acute pathology  -  Electrolytes normal, no evidence of infection on history, physical or workup.  - Not anemic. Not hypoglycemic - Doubt cardiopulmonary source of dizziness. EKG without ischemia or infarction, trop negative. No shortness of breath, new cough. Doubt acute PE as source of symptoms.  - given tylenol for headache, meclizine for dizziness, Zofran for nausea with improvement in symptoms   Dizziness is likely peripheral rather than central cause. HINTS exam negative. 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 Imaging negative. Doubt vestibular neuritis given no recent infections. Not consistent with Meniere's disease. No history of trauma. No red flags of central vertigo. Not consistent with acute CNS infection, vertebral basilar artery insufficiency, cerebellar hemorrhage or infarction, intracranial bleed.   Discussed findings with patient and husband at bedside and they verbalize understanding. She has a neurology appointment on 12/09/21 which is appropriate follow-up time frame. Will provide with prescription for Zofran, Meclizine at low dose until able to follow-up with neurology. Given her strict return precautions.   I discussed this case with my attending physician who cosigned this note including patient's presenting symptoms, physical exam, and planned diagnostics and interventions. Attending physician stated agreement with plan or made changes to plan which were implemented.   Attending physician assessed patient at bedside.   After consideration of the diagnostic results and the patients response to treatment, I feel that the patent would benefit from  discharge. Considered admission, however, this is likely peripheral cause of dizziness, not requiring new neurological consultation and admission.. The patient has been appropriately medically screened and/or stabilized in the ED. I have low suspicion for any other emergent medical condition which would require further screening, evaluation or treatment in the ED or require inpatient management. The patient is overall well appearing and non-toxic in appearance. They are hemodynamically stable at time of discharge.    Final Clinical Impression(s) / ED Diagnoses Final diagnoses:  Dizziness    Rx / DC Orders ED Discharge Orders          Ordered    ondansetron (ZOFRAN) 4 MG tablet  Every 6 hours        12/04/21 1319    meclizine (ANTIVERT) 25 MG tablet  3 times daily PRN        12/04/21 1319              Mickie Hillier, PA-C 12/04/21 1344    Noemi Chapel, MD 12/06/21 [phone]

## 2021-12-04 NOTE — Discharge Instructions (Addendum)
You were seen in the emergency department today for dizziness. You are not currently having a new stroke. Continue your medications as prescribed. I am prescribing you a medication called Meclizine for dizziness that you can use three times a day as needed. Additionally, I am prescribing you nausea medication that you can use every 6 hours as needed. Please follow-up with your neurologist at your appointment in 5 days. Please return to the emergency department for worsening symptoms or signs of stroke including slurred speech, facial droop, weakness on one side of the body  ?

## 2021-12-04 NOTE — ED Notes (Signed)
Patient verbalizes understanding of discharge instructions. Opportunity for questioning and answers were provided. Pt discharged from ED. 

## 2021-12-09 ENCOUNTER — Other Ambulatory Visit: Payer: Self-pay

## 2021-12-09 ENCOUNTER — Ambulatory Visit: Payer: Medicare Other | Admitting: Neurology

## 2021-12-09 ENCOUNTER — Encounter: Payer: Self-pay | Admitting: Neurology

## 2021-12-09 VITALS — BP 150/77 | HR 90 | Ht 64.0 in | Wt 139.0 lb

## 2021-12-09 DIAGNOSIS — I63532 Cerebral infarction due to unspecified occlusion or stenosis of left posterior cerebral artery: Secondary | ICD-10-CM

## 2021-12-09 DIAGNOSIS — I10 Essential (primary) hypertension: Secondary | ICD-10-CM

## 2021-12-09 DIAGNOSIS — F01A0 Vascular dementia, mild, without behavioral disturbance, psychotic disturbance, mood disturbance, and anxiety: Secondary | ICD-10-CM

## 2021-12-09 DIAGNOSIS — H53461 Homonymous bilateral field defects, right side: Secondary | ICD-10-CM | POA: Diagnosis not present

## 2021-12-09 MED ORDER — IRBESARTAN 75 MG PO TABS: 75.0000 mg | ORAL_TABLET | Freq: Every day | ORAL | 3 refills | Status: DC

## 2021-12-09 MED ORDER — MEMANTINE HCL 28 X 5 MG & 21 X 10 MG PO TABS: ORAL_TABLET | ORAL | 12 refills | Status: DC

## 2021-12-09 MED ORDER — MEMANTINE HCL 10 MG PO TABS: 10.0000 mg | ORAL_TABLET | Freq: Two times a day (BID) | ORAL | 3 refills | Status: DC

## 2021-12-09 NOTE — Progress Notes (Signed)
[facility] [address] [place]. [place] [id number] [phone]       OFFICE FOLLOW UP VISIT NOTE  [name] Date of Birth:  [birth year] Medical Record Number:  [id number]   Referring MD: Caroline Croak PA-C  Reason for Referral: Stroke  [id number] visit 08/05/2021: Caroline [name] is a 86 year old pleasant Caucasian lady seen for initial office consultation visit for stroke.  She is accompanied by her daughter and husband.  History is obtained from them and review of electronic medical records and I personally reviewed pertinent available imaging films in PACS.  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  She presented on 06/19/2021 on postoperative day 1 for confusion, delirium and lethargy.  This was initially felt to be blood pressure and he was taken to the hospital by EMS.  He had an MRI scan of the brain personally reviewed.  Shows old left cerebellar infarct.  Continues to have peripheral vision loss, stroke improved.  Continues to walk with a walker, but no falls or injuries.  She is tolerating well without bruising or bleeding.  Blood pressure is under good control.  Tolerating Lipitor without muscle aches and.  .  She was also felt to have underlying low-grade dementia which was exacerbated by the surgery.  Her symptoms continued for couple of days and noncontrast CT scan of the head was obtained which showed acute left posterior cerebral artery stroke.  Her NIH was 4 on admission she is not a candidate for tPA due to recent surgery.  MRI scan was subsequently obtained which showed a left posterior cerebral artery infarct and MRA showed left P2 occlusion.  2D echo showed ejection fraction of 60 to 65% without cardiac source of embolism.  LDL cholesterol was elevated at 123 mg percent and  hemoglobin A1c was 5.5.  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  Patient continues to have right-sided peripheral vision loss.  She has been ambulating with a walker and has been careful.  She has had no falls or injuries.  The family has noticed worsening of her cognitive difficulties with patient having more frequent sundowning for which she has been started on Seroquel 25 mg at night which helps her sleep well.  There are no delusions or hallucinations during the day.  She has more short-term memory difficulties and intermittent confusion which is new.  She does have 24-hour care at home and cannot be left alone. Update 12/09/2021 : She returns for follow-up after last visit 4 months ago.  She is accompanied by her husband.  Patient continues to have short-term memory difficulties as well as at times difficulty finding her way around her own home.  She gets disoriented and confused off-and-on.  He has had recurrent bouts of UTIs which have been treated by her primary care physician is currently on [place].  Se had an episode of severe dizziness on 12/04/2021 and was taken to the emergency room by EMS blood pressure was found to be significantly elevated.  An MRI scan of the brain was obtained which I personally reviewed did not show any acute abnormality and showed old left PCA infarct.  She continues  to ambulate with a walker and history of pulmonary no falls injuries.  She is tolerating Eliquis well without bleeding or bruising..  Lab work on 10/22/2021 showed LDL cholesterol to be near optimal at 73 hemoglobin A1c at 5.7. ROS:   14 system review of systems is positive for memory loss, imbalance, walking difficulty, peripheral vision loss and all other systems negative  PMH:   Past Medical History:  Diagnosis Date   Anxiety    Arthritis    Celiac sprue    CVA (cerebral vascular accident) ([facility]) 02/2018   R thalamic CVA   GERD (gastroesophageal reflux disease)    Glaucoma    Hormone replacement therapy (HRT)    Hyperlipidemia    Hypertension    Hypoglycemia    Hypothyroidism    OAB (overactive bladder)    Osteoporosis    PONV (postoperative nausea and vomiting)    Raynaud's disease    Scoliosis    Seasonal allergies    Senile purpura ([facility])    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 Cheek   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   Bridge Left Nose, and Left Neck   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 Cheek Tx: curret x 3 and 5FU   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   Bridge of nose tx Cx3 and 5FU, Left neck Tx Cx3 and 5FU   Thrombophlebitis     Social History:  Social History   Socioeconomic History   Marital status: Married    Spouse name: [name]   Number of children: 3   Years of education: 12   Highest education level: Not on file  Occupational History   Occupation: retired  Tobacco Use   Smoking status: Never   Smokeless tobacco: Never  Vaping Use   Vaping Use: Never used  Substance and Sexual Activity   Alcohol use: No   Drug use: Not Currently   Sexual activity: Never  Other Topics Concern   Not on file  Social History Narrative   Lives with husband   Right Handed   Drinks no caffeine   Social Determinants of Health   Financial Resource Strain: Not on file  Food Insecurity: Not on file  Transportation Needs: Not on file  Physical Activity: Not on file  Stress: Not on file  Social Connections: Not on file  Intimate Partner Violence: Not on file    Medications:   Current Outpatient Medications on File Prior to Visit  Medication Sig Dispense Refill   acetaminophen (TYLENOL) 325 MG tablet Take 2 tablets (650 mg total) by mouth every 6 (six) hours  as needed for mild pain, moderate pain, fever or headache.     Ascorbic Acid (VITAMIN C) 1000 MG tablet Take 1,000 mg by mouth daily.     atorvastatin (LIPITOR) 80 MG tablet Take 1 tablet (80 mg total) by mouth at bedtime. 90 tablet 1   Catheters MISC Use as directed. 1 box of inserts and 1 box canisters. 1 each 3   Cholecalciferol (VITAMIN D-3) 25 MCG (1000 UT) CAPS Take 1 capsule (1,000 Units total) by mouth daily. 30 capsule 0   Coenzyme Q10 (COQ10 PO) Take 1 tablet by mouth daily.     docusate sodium (COLACE) 100 MG capsule Take 2 capsules (200 mg total) by mouth 2 (two) times daily. 10 capsule 0   ELIQUIS 5 MG TABS tablet TAKE 1 TABLET BY MOUTH 2 TIMES DAILY. [place]  tablet 1   furosemide (LASIX) 20 MG tablet Take 1 tablet (20 mg total) by mouth daily as needed for edema. 90 tablet 0   irbesartan (AVAPRO) 75 MG tablet Take 1 tablet (75 mg total) by mouth daily. 30 tablet 0   levothyroxine (SYNTHROID) 125 MCG tablet TAKE 1 TABLET (125 MCG TOTAL) BY MOUTH DAILY AT 6 AM. 90 tablet 1   meclizine (ANTIVERT) 25 MG tablet Take 0.5 tablets (12.5 mg total) by mouth 3 (three) times daily as needed for dizziness. 30 tablet 0   nitrofurantoin (MACRODANTIN) 50 MG capsule Take 1 capsule (50 mg total) by mouth daily. 30 capsule 0   nitrofurantoin, macrocrystal-monohydrate, (MACROBID) 100 MG capsule Take 1 capsule (100 mg total) by mouth daily. 7 capsule 0   ondansetron (ZOFRAN) 4 MG tablet Take 1 tablet (4 mg total) by mouth every 6 (six) hours. 12 tablet 0   pantoprazole (PROTONIX) 40 MG tablet Take 1 tablet po QD 90 tablet 1   QUEtiapine (SEROQUEL) 25 MG tablet Take 25 mg by mouth at bedtime.     vitamin B-12 (CYANOCOBALAMIN) 250 MCG tablet Take 1 tablet (250 mcg total) by mouth daily. 30 tablet 0   VITAMIN D PO Take 250 mg by mouth.     No current facility-administered medications on file prior to visit.    Allergies:   Allergies  Allergen Reactions   Gluten Meal Nausea And Vomiting    Patient has  CELIAC DISEASE   Wheat Bran Nausea And Vomiting    Patient has CELIAC DISEASE   Adhesive [Tape] Other (See Comments)    Patient's skin is VERY THIN and tears very easily; please use either paper tape or Coban wrap   Codeine Nausea And Vomiting   Other Other (See Comments)    Patient has Hypoglycemia; her husband stated "when her sugar crashes, it crashes hard."   Penicillins Other (See Comments)    Doesn't remember reaction , but denies any SOB/Swelling States " it was a long time ago "     Physical Exam General: Frail elderly Caucasian lady., seated, in no evident distress Head: head normocephalic and atraumatic.   Neck: supple with no carotid or supraclavicular bruits Cardiovascular: regular rate and rhythm, no murmurs Musculoskeletal: Severe kyphoscoliosis. Skin:  no rash/petichiae Vascular:  Normal pulses all extremities  Neurologic Exam Mental Status: Awake and fully alert. Oriented to place and time. Recent and remote memory intact. Attention span, concentration and fund of knowledge appropriate. Mood and affect appropriate.  Diminished recall 0/3.  Able to name only 7 animals which can walk on 4 legs.  Clock drawing is impaired 2/4.  Unable to copy intersecting pentagons.  Montreal overall cognitive assessment scale ( MOCA)scored on 11/30.  Able to name only 6 animals that can walk on 4 legs.  Clock drawing 2/4. Cranial Nerves: Fundoscopic exam reveals sharp disc margins. Pupils equal, briskly reactive to light. Extraocular movements full without nystagmus. Visual fields show dense right homonymous hemianopsia l to confrontation. Hearing intact. Facial sensation intact. Face, tongue, palate moves normally and symmetrically.  Motor: Normal bulk and tone. Normal strength in all tested extremity muscles. Sensory.: intact to touch , pinprick , position and vibratory sensation.  Coordination: Rapid alternating movements normal in all extremities. Finger-to-nose and heel-to-shin performed  accurately bilaterally. Gait and Station: Arises from chair without difficulty.  Uses a walker stance is nearly stooped. . Gait demonstrates slight imbalance but uses a walker well..  Not able to heel, toe and tandem  walk  Reflexes: 1+ and symmetric. Toes downgoing.   NIHSS  2 Modified Rankin  3 Montreal Cognitive Assessment  12/09/2021  Visuospatial/ Executive (0/5) 0  Naming (0/3) 2  Attention: Read list of digits (0/2) 2  Attention: Read list of letters (0/1) 0  Attention: Serial 7 subtraction starting at 100 (0/3) 2  Language: Repeat phrase (0/2) 0  Language : Fluency (0/1) 0  Abstraction (0/2) 2  Delayed Recall (0/5) 0  Orientation (0/6) 3  Total 11  Adjusted Score (based on education) 11     ASSESSMENT: 86 year old Caucasian lady lady with left posterior cerebral artery infarction secondary to left P2 occlusion in September 2022 with significant residual right-sided homonymous hemianopsia as well as poststroke mild vascular dementia.  Multiple vascular risk factors of hypertension , hyperlipidemia and age     PLAN:I had a long discussion with the patient, her husband and daughter regarding her recent stroke and worsening memory and cognitive impairment which is likely due to combination of mild vascular dementia with underlying age-related cognitive impairment.  I recommend she discontinue aspirin and stay on Xarelto alone given history of DVT for stroke prevention and maintain aggressive risk factor modification with strict control of hypertension with blood pressure goal below 130/90, lipids with LDL cholesterol goal below 70 mg percent and diabetes with hemoglobin A1c goal below 6.5%.  She was encouraged to use a walker at all times and discussed fall safety precautions.  We also discussed memory compensation strategies and advised her to increase participation in cognitively challenging activities like solving crossword puzzles, playing bridge and sodoku.  She will return for  follow-up in the future in 3 months with my nurse practitioner on call earlier if necessary.  Greater than 50% time during this 45-minute visit was  spent on counseling and coordination of care about her stroke and memory loss and cognitive impairment and answering questions. Antony Contras, MD  Note: This document was prepared with digital dictation and possible smart phrase technology. Any transcriptional errors that result from this process are unintentional.

## 2021-12-09 NOTE — Patient Instructions (Signed)
I had a long discussion with the patient and her husband regarding her memory loss and cognitive impairment poststroke likely from mild vascular dementia I recommend further evaluation checking memory panel labs, EEG with trial of Namenda starter pack and if tolerated 10 mg twice daily subsequently.  Continue Eliquis for stroke prevention given.  DVT and maintain aggressive risk factor modification with strict control of hypertension with blood pressure goal below 140/90  and cholesterol goal below 70 mg .  Continue ongoing treatment for her recurrent urinary tract infection as per patient.  Return for follow-up in the future in 3 months or call earlier if necessary ?

## 2021-12-10 ENCOUNTER — Encounter: Payer: Self-pay | Admitting: Physical Medicine & Rehabilitation

## 2021-12-10 ENCOUNTER — Encounter: Payer: Medicare Other | Attending: Registered Nurse | Admitting: Physical Medicine & Rehabilitation

## 2021-12-10 ENCOUNTER — Other Ambulatory Visit: Payer: Self-pay

## 2021-12-10 VITALS — BP 138/69 | HR 98 | Temp 97.6°F | Ht 64.0 in | Wt 140.6 lb

## 2021-12-10 DIAGNOSIS — I63332 Cerebral infarction due to thrombosis of left posterior cerebral artery: Secondary | ICD-10-CM | POA: Insufficient documentation

## 2021-12-10 LAB — DEMENTIA PANEL
Homocysteine: 10.8 umol/L (ref 0.0–21.3)
RPR Ser Ql: NONREACTIVE
TSH: 1.99 u[IU]/mL (ref 0.450–4.500)
Vitamin B-12: 1367 pg/mL — ABNORMAL HIGH (ref 232–1245)

## 2021-12-10 NOTE — Progress Notes (Signed)
? ?Subjective:  ? ? Patient ID: Caroline [name], female    DOB: [birth year], 86 y.o.   MRN: [id number] ?86 y.o. right-handed female with history of prior right thalamic CVA 2019 maintained on Plavix mild cognitive impairment chronic back pain history of DVT paraesophageal hernia hypertension hyperlipidemia.  Per chart review lives with spouse.  Independent prior to admission able to perform her own ADLs.  Presented to [facility] 06/15/2021 with intermittent heartburn and episodes of severe regurgitation.  She had an endoscopy February 2020 confirming grade D reflux esophagitis and again June 2020 with little improvement in endoscopic findings on high-dose PPI.  Biopsies were negative.  She had a normal gastric emptying study 2020.  Followed by general surgery patient underwent robotic paraesophageal hernia repair gastropexy upper endoscopy 06/15/2021 per Dr. Doylene Canard.  Her diet was slowly advanced.  Post procedure noted somnolence increased disorientation CT/MRI showed large acute left PCA territory infarct without hemorrhage or mass-effect.  Patient did not receive tPA.  MRA showed occlusion of the left PCA at the distal P2 segment.  Severe atrophy and chronic small vessel ischemia.  Echocardiogram with ejection fraction of 60 to 65% grade 1 diastolic dysfunction  ?HPI ? ?Has had recurrent UTI, see urology in am  ?Acute vertigo seen by ED MD 3/3 ? ?Good sleep and appetite ?Pain Inventory ?Average Pain 1 ?Pain Right Now 1 ?My pain is  n/a ? ?In the last 24 hours, has pain interfered with the following? ?General activity 0 ?Relation with others 0 ?Enjoyment of life 0 ?What TIME of day is your pain at its worst? morning  ?Sleep (in general) Good ? ?Pain is worse with: unsure ?Pain improves with: rest and medication ?Relief from Meds: 10 ? ?Family History  ?Problem Relation Age of Onset  ? Arthritis Mother   ?     pt denies  ? Hypertension Mother   ?     pt denies  ? Obesity Sister   ? Cancer Maternal Uncle   ?     type  unknown  ? Colon cancer Neg Hx   ? Stomach cancer Neg Hx   ? Esophageal cancer Neg Hx   ? Pancreatic cancer Neg Hx   ? ?Social History  ? ?Socioeconomic History  ? Marital status: Married  ?  Spouse name: [name]  ? Number of children: 3  ? Years of education: 82  ? Highest education level: Not on file  ?Occupational History  ? Occupation: retired  ?Tobacco Use  ? Smoking status: Never  ? Smokeless tobacco: Never  ?Vaping Use  ? Vaping Use: Never used  ?Substance and Sexual Activity  ? Alcohol use: No  ? Drug use: Not Currently  ? Sexual activity: Never  ?Other Topics Concern  ? Not on file  ?Social History Narrative  ? Lives with husband  ? Right Handed  ? Drinks no caffeine  ? ?Social Determinants of Health  ? ?Financial Resource Strain: Not on file  ?Food Insecurity: Not on file  ?Transportation Needs: Not on file  ?Physical Activity: Not on file  ?Stress: Not on file  ?Social Connections: Not on file  ? ?Past Surgical History:  ?Procedure Laterality Date  ? ABDOMINAL HYSTERECTOMY  1979  ? CATARACT EXTRACTION, BILATERAL    ? knee surgery Left   ? VARICOSE VEIN SURGERY Left   ? XI ROBOTIC ASSISTED PARAESOPHAGEAL HERNIA REPAIR N/A 06/15/2021  ? Procedure: XI ROBOTIC ASSISTED PARAESOPHAGEAL HERNIA REPAIR, GASTROPEXY, UPPER ENDOSCOPY;  Surgeon: Berna Bue,  MD;  Location: [facility];  Service: General;  Laterality: N/A;  ? ?Past Surgical History:  ?Procedure Laterality Date  ? ABDOMINAL HYSTERECTOMY  1979  ? CATARACT EXTRACTION, BILATERAL    ? knee surgery Left   ? VARICOSE VEIN SURGERY Left   ? XI ROBOTIC ASSISTED PARAESOPHAGEAL HERNIA REPAIR N/A 06/15/2021  ? Procedure: XI ROBOTIC ASSISTED PARAESOPHAGEAL HERNIA REPAIR, GASTROPEXY, UPPER ENDOSCOPY;  Surgeon: Berna Bue, MD;  Location: [facility];  Service: General;  Laterality: N/A;  ? ?Past Medical History:  ?Diagnosis Date  ? Anxiety   ? Arthritis   ? Celiac sprue   ? CVA (cerebral vascular accident) ([facility]) 02/2018  ? R thalamic CVA  ? GERD (gastroesophageal  reflux disease)   ? Glaucoma   ? Hormone replacement therapy (HRT)   ? Hyperlipidemia   ? Hypertension   ? Hypoglycemia   ? Hypothyroidism   ? OAB (overactive bladder)   ? Osteoporosis   ? PONV (postoperative nausea and vomiting)   ? Raynaud's disease   ? Scoliosis   ? Seasonal allergies   ? Senile purpura ([facility])   ? 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 Right Cheek  ? 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 Bridge Left Nose, and Left Neck  ? 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 Right Cheek Tx: curret x 3 and 5FU  ? 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 Bridge of nose tx Cx3 and 5FU, Left neck Tx Cx3 and 5FU  ? Thrombophlebitis   ? ?BP 138/69   Pulse 98   Temp 97.6 ?F (36.4 ?C)   Ht 5\' 4"  (1.626 m)   Wt 140 lb 9.6 oz (63.8 kg)   SpO2 97%   BMI 24.13 kg/m?  ? ?Opioid Risk Score:   ?Fall Risk Score:  `1 ? ?Depression screen PHQ 2/9 ? ?Depression screen [facility] 2/9 12/10/2021 10/21/2021 07/28/2021 07/21/2021  ?Decreased Interest 1 0 1 1  ?Down, Depressed, Hopeless 1 0 1 1  ?PHQ - 2 Score 2 0 2 2  ?Altered sleeping - 0 1 0  ?Tired, decreased energy - 0 2 1  ?Change in appetite - 0 1 1  ?Feeling bad or failure about yourself  - 0 1 0  ?Trouble concentrating - 0 2 3  ?Moving slowly or fidgety/restless - 0 2 1  ?Suicidal thoughts - 0 0 0  ?PHQ-9 Score - 0 11 8  ?Difficult doing work/chores - - - Very difficult  ?  ? ?Review of Systems  ?Constitutional: Negative.   ?HENT: Negative.    ?Eyes: Negative.   ?Respiratory: Negative.    ?Cardiovascular: Negative.   ?Gastrointestinal: Negative.   ?Endocrine: Negative.   ?Genitourinary: Negative.   ?Musculoskeletal:  Positive for back pain and gait problem.  ?Skin: Negative.   ?Allergic/Immunologic: Negative.   ?Hematological:  Bruises/bleeds easily.  ?     Eliquis  ?Psychiatric/Behavioral: Negative.    ?All other systems reviewed and are negative. ? ?   ?Objective:  ? Physical Exam ?Vitals reviewed.  ?Constitutional:   ?    Appearance: She is normal weight.  ?HENT:  ?   Head: Normocephalic and atraumatic.  ?Eyes:  ?   General: Visual field deficit present.  ?   Extraocular Movements: Extraocular movements intact.  ?   Conjunctiva/sclera: Conjunctivae normal.  ?   Pupils: Pupils are equal, round, and reactive to light.  ?Musculoskeletal:  ?   Right lower leg: No  edema.  ?   Left lower leg: No edema.  ?   Comments: Severe thoracolumbar scoliosis convex to the left with kyphosis ?No pain with upper extremity range of motion ?Negative straight leg raise bilaterally ?  ?Skin: ?   General: Skin is warm and dry.  ?Neurological:  ?   Mental Status: She is alert. She is confused.  ?   Cranial Nerves: No dysarthria or facial asymmetry.  ?   Motor: Weakness present.  ?   Coordination: Coordination is intact.  ?   Gait: Gait abnormal.  ?Psychiatric:     ?   Mood and Affect: Mood normal.     ?   Behavior: Behavior normal.  ? ?Right homonymous hemianopsia seen on visual confrontation testing ?Poor compensation for field cut during ambulation. ?Ambulates with a rolling walker forward flexed posture no evidence of toe drag or knee instability, she does run into objects on her right side. ? ? ?   ?Assessment & Plan:  ? ?#1.  Left PCA distribution infarct with right homonymous hemianopsia right neglect and cognitive deficits.  Given the timeframe of recovery I do not expect much improvement in the right homonymous hemianopsia.  She still has the potential to improve in terms of her cognitive deficits which also may help with the right neglect.  Do think she would benefit from additional PT OT speech.  She has fallen at home she can work on her balance. ?Physical medicine rehab follow-up in 6 months ?Follow-up with ophthalmology ?Follow-up with neurology ?Follow-up with PCP for blood pressure ?

## 2021-12-11 ENCOUNTER — Ambulatory Visit: Payer: Medicare Other | Admitting: Urology

## 2021-12-11 ENCOUNTER — Encounter: Payer: Self-pay | Admitting: Urology

## 2021-12-11 VITALS — BP 166/78 | HR 86 | Wt 141.0 lb

## 2021-12-11 DIAGNOSIS — R8271 Bacteriuria: Secondary | ICD-10-CM

## 2021-12-11 DIAGNOSIS — N3941 Urge incontinence: Secondary | ICD-10-CM

## 2021-12-11 LAB — BLADDER SCAN AMB NON-IMAGING: Scan Result: 17

## 2021-12-11 MED ORDER — GEMTESA 75 MG PO TABS: 75.0000 mg | ORAL_TABLET | Freq: Every day | ORAL | 0 refills | Status: DC

## 2021-12-11 NOTE — Progress Notes (Signed)
? ?12/11/2021 ?4:33 PM  ? ?Caroline [name] ?[birth year] ?[id number] ? ?Referring provider: Carlean JewsBoscia, Heather E, NP ?[address] ?[address] ?[place],  [place] [id number] ? ?Chief Complaint  ?Patient presents with  ? Urinary Incontinence  ? ? ?HPI: ?86 y.o. female presents for follow-up visit.  She presents today with her husband who provided the history. ? ?Refer to my prior note 11/06/2021 ?She was placed on nitrofurantoin prophylaxis since her last visit ?Urine culture on 11/06/2021 grew mixed flora ?He states since her last visit she has continued to have intermittent episodes of delirium which he thinks is secondary to UTI ?He brought in a sample from her [employer] [place] collection device today and was informed this was a contaminated specimen ?She had no complaints today ? ? ?PMH: ?Past Medical History:  ?Diagnosis Date  ? Anxiety   ? Arthritis   ? Celiac sprue   ? CVA (cerebral vascular accident) (HCC) 02/2018  ? R thalamic CVA  ? GERD (gastroesophageal reflux disease)   ? Glaucoma   ? Hormone replacement therapy (HRT)   ? Hyperlipidemia   ? Hypertension   ? Hypoglycemia   ? Hypothyroidism   ? OAB (overactive bladder)   ? Osteoporosis   ? PONV (postoperative nausea and vomiting)   ? Raynaud's disease   ? Scoliosis   ? Seasonal allergies   ? Senile purpura (HCC)   ? 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 Right Cheek  ? 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 Bridge Left Nose, and Left Neck  ? 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 Right Cheek Tx: curret x 3 and 5FU  ? 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 Bridge of nose tx Cx3 and 5FU, Left neck Tx Cx3 and 5FU  ? Thrombophlebitis   ? ? ?Surgical History: ?Past Surgical History:  ?Procedure Laterality Date  ? ABDOMINAL HYSTERECTOMY  1979  ? CATARACT EXTRACTION, BILATERAL    ? knee surgery Left   ? VARICOSE VEIN SURGERY Left   ? XI ROBOTIC ASSISTED PARAESOPHAGEAL HERNIA REPAIR N/A 06/15/2021  ? Procedure: XI ROBOTIC ASSISTED  PARAESOPHAGEAL HERNIA REPAIR, GASTROPEXY, UPPER ENDOSCOPY;  Surgeon: Berna Bueonnor, Chelsea A, MD;  Location: [facility];  Service: General;  Laterality: N/A;  ? ? ?Home Medications:  ?Allergies as of 12/11/2021   ? ?   Reactions  ? Gluten Meal Nausea And Vomiting  ? Patient has CELIAC DISEASE  ? Wheat Bran Nausea And Vomiting  ? Patient has CELIAC DISEASE  ? Adhesive [tape] Other (See Comments)  ? Patient's skin is VERY THIN and tears very easily; please use either paper tape or Coban wrap  ? Codeine Nausea And Vomiting  ? Other Other (See Comments)  ? Patient has Hypoglycemia; her husband stated "when her sugar crashes, it crashes hard."  ? Penicillins Other (See Comments)  ? Doesn't remember reaction , but denies any SOB/Swelling ?States " it was a long time ago "   ? ?  ? ?  ?Medication List  ?  ? ?  ? Accurate as of December 11, 2021  4:33 PM. If you have any questions, ask your nurse or doctor.  ?  ?  ? ?  ? ?acetaminophen 325 MG tablet ?Commonly known as: TYLENOL ?Take 2 tablets (650 mg total) by mouth every 6 (six) hours as needed for mild pain, moderate pain, fever or headache. ?  ?atorvastatin 80 MG tablet ?Commonly known as: LIPITOR ?Take 1  tablet (80 mg total) by mouth at bedtime. ?  ?Catheters Misc ?Use as directed. 1 box of inserts and 1 box canisters. ?  ?COQ10 PO ?Take 1 tablet by mouth daily. ?  ?Eliquis 5 MG Tabs tablet ?Generic drug: apixaban ?TAKE 1 TABLET BY MOUTH 2 TIMES DAILY. ?  ?furosemide 20 MG tablet ?Commonly known as: LASIX ?Take 1 tablet (20 mg total) by mouth daily as needed for edema. ?  ?Gemtesa 75 MG Tabs ?Generic drug: Vibegron ?Take 75 mg by mouth daily. ?Started by: Riki Altes, MD ?  ?irbesartan 75 MG tablet ?Commonly known as: AVAPRO ?Take 1 tablet (75 mg total) by mouth daily. ?  ?levothyroxine 125 MCG tablet ?Commonly known as: SYNTHROID ?TAKE 1 TABLET (125 MCG TOTAL) BY MOUTH DAILY AT 6 AM. ?  ?meclizine 25 MG tablet ?Commonly known as: ANTIVERT ?Take 0.5 tablets (12.5 mg total) by  mouth 3 (three) times daily as needed for dizziness. ?  ?memantine tablet pack ?Commonly known as: Namenda Titration Pak ?5 mg/day for =1 week; 5 mg twice daily for =1 week; 15 mg/day given in 5 mg and 10 mg separated doses for =1 week; then 10 mg twice daily ?  ?memantine 10 MG tablet ?Commonly known as: Namenda ?Take 1 tablet (10 mg total) by mouth 2 (two) times daily. ?  ?nitrofurantoin (macrocrystal-monohydrate) 100 MG capsule ?Commonly known as: MACROBID ?Take 1 capsule (100 mg total) by mouth daily. ?  ?nitrofurantoin 50 MG capsule ?Commonly known as: MACRODANTIN ?Take 1 capsule (50 mg total) by mouth daily. ?  ?ondansetron 4 MG tablet ?Commonly known as: ZOFRAN ?Take 1 tablet (4 mg total) by mouth every 6 (six) hours. ?  ?pantoprazole 40 MG tablet ?Commonly known as: PROTONIX ?Take 1 tablet po QD ?  ?QUEtiapine 25 MG tablet ?Commonly known as: SEROQUEL ?Take 25 mg by mouth at bedtime. ?  ?vitamin B-12 250 MCG tablet ?Commonly known as: CYANOCOBALAMIN ?Take 1 tablet (250 mcg total) by mouth daily. ?  ?vitamin C 1000 MG tablet ?Take 1,000 mg by mouth daily. ?  ?VITAMIN D PO ?Take 250 mg by mouth. ?  ?Vitamin D-3 25 MCG (1000 UT) Caps ?Take 1 capsule (1,000 Units total) by mouth daily. ?  ? ?  ? ? ?Allergies:  ?Allergies  ?Allergen Reactions  ? Gluten Meal Nausea And Vomiting  ?  Patient has CELIAC DISEASE  ? Wheat Bran Nausea And Vomiting  ?  Patient has CELIAC DISEASE  ? Adhesive [Tape] Other (See Comments)  ?  Patient's skin is VERY THIN and tears very easily; please use either paper tape or Coban wrap  ? Codeine Nausea And Vomiting  ? Other Other (See Comments)  ?  Patient has Hypoglycemia; her husband stated "when her sugar crashes, it crashes hard."  ? Penicillins Other (See Comments)  ?  Doesn't remember reaction , but denies any SOB/Swelling ?States " it was a long time ago "   ? ? ?Family History: ?Family History  ?Problem Relation Age of Onset  ? Arthritis Mother   ?     pt denies  ? Hypertension  Mother   ?     pt denies  ? Obesity Sister   ? Cancer Maternal Uncle   ?     type unknown  ? Colon cancer Neg Hx   ? Stomach cancer Neg Hx   ? Esophageal cancer Neg Hx   ? Pancreatic cancer Neg Hx   ? ? ?Social History:  reports that she  has never smoked. She has never used smokeless tobacco. She reports that she does not currently use drugs. She reports that she does not drink alcohol. ? ? ?Physical Exam: ?BP (!) 166/78   Pulse 86   Wt 141 lb (64 kg)   BMI 24.20 kg/m?   ?Constitutional:  Alert, No acute distress. ?HEENT: [place] AT, moist mucus membranes.  Trachea midline, no masses. ?Cardiovascular: No clubbing, cyanosis, or edema. ?Respiratory: Normal respiratory effort, no increased work of breathing. ? ? ?Assessment & Plan:   ? ?1. Bacteriuria ?Bladder scan was performed which was much improved from last visit at 17 mL ?She was catheterized and urinalysis showed no RBC/WBC but bacteria were plaque present ?Urine culture was repeated ?If there is no significant growth I informed her husband that her delirium would not be related to UTI and he was in agreement ? ?2. Urge incontinence ?She has failed multiple medications and PTNS ?She has not tried [place] and was given 4 weeks worth of samples ? ? ?Riki Altes, MD ? ?[facility] ?[address], [address] ?[place], [place] [address] ?[phone] ?

## 2021-12-11 NOTE — Progress Notes (Signed)
Kindly inform patient that all lab work for reversible causes of memory loss was satisfactory

## 2021-12-12 LAB — URINALYSIS, COMPLETE
Bilirubin, UA: NEGATIVE
Glucose, UA: NEGATIVE
Ketones, UA: NEGATIVE
Leukocytes,UA: NEGATIVE
Nitrite, UA: NEGATIVE
Protein,UA: NEGATIVE
RBC, UA: NEGATIVE
Specific Gravity, UA: 1.025 (ref 1.005–1.030)
Urobilinogen, Ur: 0.2 mg/dL (ref 0.2–1.0)
pH, UA: 6 (ref 5.0–7.5)

## 2021-12-12 LAB — MICROSCOPIC EXAMINATION

## 2021-12-14 ENCOUNTER — Telehealth: Payer: Self-pay | Admitting: *Deleted

## 2021-12-14 NOTE — Telephone Encounter (Signed)
-----   Message from Caroline Riley, MD sent at 12/11/2021  3:32 PM EST ----- ?Kindly inform patient that all lab work for reversible causes of memory loss was satisfactory ?

## 2021-12-14 NOTE — Telephone Encounter (Signed)
Pt verified by name and DOB,  normal results given per provider, SPOUSE voiced understanding all question answered.  ?

## 2021-12-17 ENCOUNTER — Other Ambulatory Visit: Payer: Medicare Other

## 2021-12-21 ENCOUNTER — Ambulatory Visit: Payer: Medicare Other | Admitting: Neurology

## 2021-12-21 DIAGNOSIS — R410 Disorientation, unspecified: Secondary | ICD-10-CM

## 2021-12-21 DIAGNOSIS — F01A0 Vascular dementia, mild, without behavioral disturbance, psychotic disturbance, mood disturbance, and anxiety: Secondary | ICD-10-CM

## 2021-12-22 NOTE — Telephone Encounter (Signed)
Pt's husband called and has some questions about Gemtesa.  Also, he would like to know if pt can get a RX for Macrobid 50 mg as a daily maintenance. ?

## 2021-12-22 NOTE — Telephone Encounter (Signed)
Talked with patient husband. He states she has only been on the samples of gemtesa for a week in a half. Not sure if the medication is working he will call in 2 weeks to let us know if they would like a refill. ?

## 2021-12-24 NOTE — Progress Notes (Signed)
EEG or brain wave activity was normal

## 2021-12-28 ENCOUNTER — Other Ambulatory Visit: Payer: Self-pay

## 2021-12-28 ENCOUNTER — Ambulatory Visit: Payer: Medicare Other | Admitting: Occupational Therapy

## 2021-12-28 ENCOUNTER — Ambulatory Visit: Payer: Medicare Other | Admitting: Speech Pathology

## 2021-12-28 ENCOUNTER — Ambulatory Visit: Payer: Medicare Other | Attending: Nurse Practitioner | Admitting: Physical Therapy

## 2021-12-28 ENCOUNTER — Encounter: Payer: Self-pay | Admitting: Speech Pathology

## 2021-12-28 DIAGNOSIS — R41841 Cognitive communication deficit: Secondary | ICD-10-CM

## 2021-12-28 DIAGNOSIS — I63332 Cerebral infarction due to thrombosis of left posterior cerebral artery: Secondary | ICD-10-CM | POA: Insufficient documentation

## 2021-12-28 DIAGNOSIS — R293 Abnormal posture: Secondary | ICD-10-CM

## 2021-12-28 DIAGNOSIS — R2689 Other abnormalities of gait and mobility: Secondary | ICD-10-CM | POA: Insufficient documentation

## 2021-12-28 DIAGNOSIS — H53461 Homonymous bilateral field defects, right side: Secondary | ICD-10-CM | POA: Insufficient documentation

## 2021-12-28 DIAGNOSIS — R2681 Unsteadiness on feet: Secondary | ICD-10-CM | POA: Diagnosis not present

## 2021-12-28 DIAGNOSIS — M6281 Muscle weakness (generalized): Secondary | ICD-10-CM | POA: Diagnosis present

## 2021-12-28 DIAGNOSIS — R296 Repeated falls: Secondary | ICD-10-CM | POA: Diagnosis present

## 2021-12-28 DIAGNOSIS — I69318 Other symptoms and signs involving cognitive functions following cerebral infarction: Secondary | ICD-10-CM | POA: Diagnosis present

## 2021-12-28 NOTE — Therapy (Signed)
?OUTPATIENT SPEECH LANGUAGE PATHOLOGY EVALUATION ? ? ?Patient Name: Caroline [name] ?MRN: [id number] ?DOB:09/01/1936, 86 y.o., female ?Today's Date: 12/28/2021 ? ?PCP: Carlean Jews, NP ?REFERRING PROVIDER: Erick Colace, MD  ? End of Session - 12/28/21 1454   ? ? Visit Number 1   ? Number of Visits 25   ? Date for SLP Re-Evaluation 03/22/22   ? SLP Start Time 1230   ? SLP Stop Time  1315   ? SLP Time Calculation (min) 45 min   ? Activity Tolerance Patient tolerated treatment well   ? ?  ?  ? ?  ? ? ?Past Medical History:  ?Diagnosis Date  ? Anxiety   ? Arthritis   ? Celiac sprue   ? CVA (cerebral vascular accident) (HCC) 02/2018  ? R thalamic CVA  ? GERD (gastroesophageal reflux disease)   ? Glaucoma   ? Hormone replacement therapy (HRT)   ? Hyperlipidemia   ? Hypertension   ? Hypoglycemia   ? Hypothyroidism   ? OAB (overactive bladder)   ? Osteoporosis   ? PONV (postoperative nausea and vomiting)   ? Raynaud's disease   ? Scoliosis   ? Seasonal allergies   ? Senile purpura (HCC)   ? 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 Right Cheek  ? 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 Bridge Left Nose, and Left Neck  ? 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 Right Cheek Tx: curret x 3 and 5FU  ? 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 Bridge of nose tx Cx3 and 5FU, Left neck Tx Cx3 and 5FU  ? Thrombophlebitis   ? ?Past Surgical History:  ?Procedure Laterality Date  ? ABDOMINAL HYSTERECTOMY  1979  ? CATARACT EXTRACTION, BILATERAL    ? knee surgery Left   ? VARICOSE VEIN SURGERY Left   ? XI ROBOTIC ASSISTED PARAESOPHAGEAL HERNIA REPAIR N/A 06/15/2021  ? Procedure: XI ROBOTIC ASSISTED PARAESOPHAGEAL HERNIA REPAIR, GASTROPEXY, UPPER ENDOSCOPY;  Surgeon: Berna Bue, MD;  Location: [facility];  Service: General;  Laterality: N/A;  ? ?Patient Active Problem List  ? Diagnosis Date Noted  ? Urinary incontinence 11/29/2021  ? Multiple pulmonary nodules 11/29/2021  ?  Hypertrophic toenail 11/29/2021  ? Confusion 10/25/2021  ? Vitamin B12 deficiency 10/25/2021  ? DVT (deep venous thrombosis) (HCC) 07/30/2021  ? Dysphagia, post-stroke   ? Hypoalbuminemia due to protein-calorie malnutrition (HCC)   ? Benign essential HTN   ? Dyslipidemia   ? Delirium due to multiple etiologies 06/17/2021  ? S/P repair of paraesophageal hernia 06/15/2021  ? CVA (cerebral vascular accident) (HCC) 02/09/2018  ? Hypothyroidism 02/08/2018  ? Essential hypertension 02/08/2018  ? Acute ischemic stroke (HCC) 02/08/2018  ? Chronic urinary tract infection 02/08/2018  ? Acute cystitis without hematuria   ? Right thalamic stroke [facility])   ? Hyperlipidemia   ? ? ?ONSET DATE: 06/15/2021  ? 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 ?THERAPY DIAG:  ?Cognitive communication deficit - Plan: SLP plan of care cert/re-cert ? ?SUBJECTIVE:  ? ?SUBJECTIVE STATEMENT: ?"I had a stroke and then I fell" ?Pt accompanied by: significant other ?Caroline [name] ? ?PERTINENT HISTORY: HPI: Caroline [name] is an 86 year old right-handed female history of prior right thalamic 2019 CVA maintained on Plavix, mild cognitive impairment, chronic back pain, history of DVT, paraesophageal hernia followed by GI/general surgery, hypertension, hyperlipidemia. Patient lives with spouse. Two-level home bed  and bath main level with one-step to entry. Reportedly independent prior to admission able to perform her own ADLs although she did need supervision assist.  Presented to [facility] on 06/15/2021 with intermittent heartburn and episodes of severe regurgitation. Follow-up general surgery patient underwent robotic paraesophageal hernia repair, gastropexy, upper endoscopy 06/15/2021 per Dr. Fredricka Bonine.  Her diet was slowly advanced. Post procedure noted somnolence increased disorientation. CT/MRI showed large acute left PCA territory infarct without hemorrhage or mass-effect. MRA showed  occlusion of the left PCA at the distal P2 segment. She received ST for cognition on CIR, then HHST ?  ? ?PAIN:  ?Are you having pain? Yes: not rated due to cognition ?Pain location: generalized all over ?Pain description: ache ?Aggravating factors: not identified ?Relieving factors:not identified ? ? ?FALLS: Has patient fallen in last 6 months?  Yes Has PT eval today ? ?LIVING ENVIRONMENT: ?Lives with: lives with their spouse ?Lives in: House/apartment ? ?PLOF:  ?Level of assistance: Needed assistance with ADLs, Needed assistance with IADLS ?Employment: Retired ? ? ?PATIENT GOALS To get better ? ?OBJECTIVE:  ? ? ?COGNITION: ?Overall cognitive status: Impaired: Commands: Follows one step commands consistently ?Attention: Deficits Sustained attention impaired ?Memory: Deficits Short term, working memory, prospective memory ?Awareness: Deficits intellectual impairment ?Problem solving: Deficits safety, decision making ?Behavior: Verbal agitation and Confabulation ?Attention: Impaired: Sustained ?Memory: Impaired: Working ?Short term ?Procedural ?Prospective ?Auditory ?Awareness: Impaired: Intellectual ?Executive function: Impaired: Impulse control, Problem solving, Organization, Planning, Error awareness, and Self-correction ?Behavior: Verbal agitation and Confabulation ?Functional deficits:  ? ? ?COGNITIVE COMMUNICATION ?Following directions: Follows one step commands consistently  ?Auditory comprehension: Impaired: due to working memory ?Verbal expression: WFL ?Functional communication: WFL ? ?ORAL MOTOR EXAMINATION ?Oral Motor Exam WFL for tasks assessed ?Cough: WFL ?Voice: WFL ? ? ?TODAY'S TREATMENT:  ? ? ? ?PATIENT EDUCATION: ?Education details: Goals for ST, games vs functional tasks to support memory ?Person educated: Patient and Spouse ?Education method: Explanation and Verbal cues ?Education comprehension: verbal cues required and needs further education ? ? ? ? ?GOALS: ?Goals reviewed with patient?  Yes ? ?SHORT TERM GOALS: Target date: 01/25/2022 ? ?Caregivers will use 2 external aids for pt to find chair controls on the right side of her chair with 50% less cueing from spouse, subjectively ? ?Goal status: INITIAL ? ?2.  Caregivers will carryover 2 external aids to reduce pt from getting up with out asking for help 50% less subjectively by spouse ?Goal status: INITIAL ? ?3.  Pt and Caregivers will use memory book for pt to recall 8 family names with usual min A  ? ?Goal status: INITIAL ? ? ? ? ?LONG TERM GOALS: Target date: 03/22/2022 ? ?Caregivers will use external aids for memory in 2 additional situations where safety for frustration are a concern ? ?Goal status: INITIAL ? ?2.  Caregivers and pt will use memory book to recall 2 pertinent facts about 5 family members with usual min A ?Goal status: INITIAL ? ?3.  Caregivers/pt will use external memory aids or memory book to recall personally relevant places, names, trips etc over 4 sessions ?Goal status: INITIAL ? ?4.  Caregivers/pt will use external aids to recall that she is home, with 50% reduction in confusion /frustration about this reported subjectively by spouse ?Goal status: INITIAL ? ?5.  Pt will attend to the right side of her memory book with external aids and verbal cues to locate information 3/5x with occasional min A ? ?Goal status: INITIAL ? ?ASSESSMENT: ? ?CLINICAL IMPRESSION: ?  Caroline [name] is referred by Dr. Wynn BankerKirsteins due to cognitive changes since CVA in September. She is accompanied by her spouse, Caroline [name]. They are concerned for pt's memory. She had MCI dx since 2019. Due to significant cognitive impairment, standardized eval not warranted. Caroline [name] required cues from Caroline [name] to recall 3/3 pet's names, 1/3 daughter's names,  2/3 grandchildren's names and son in laws name. She recalled her sister's name independently. Caroline [name] reports that Caroline [name] forgets her mother has died, often asking about her and occasionally confusing Caroline [name] for her mother. He  questions if he should correct her or not. Today she insisted her mother was alive.  She also frequently questions why she is not "home" and Caroline [name] has to convince her that she is in her home. Caroline [name] is managing all finan

## 2021-12-28 NOTE — Therapy (Signed)
?OUTPATIENT OCCUPATIONAL THERAPY NEURO EVALUATION ? ?Patient Name: Caroline [name] ?MRN: [id number] ?DOB:[birth year], 86 y.o., female ?Today's Date: 12/28/2021 ? ?PCP: Carlean Jews, NP ?REFERRING PROVIDER: Erick Colace, MD  ? ? OT End of Session - 12/28/21 1128   ? ? Visit Number 1   ? Number of Visits 17   ? Date for OT Re-Evaluation 02/22/22   ? Authorization Type [employer] MCR   ? Progress Note Due on Visit 10   ? OT Start Time 1017   ? OT Stop Time 1102   ? OT Time Calculation (min) 45 min   ? Activity Tolerance Patient tolerated treatment well   ? Behavior During Therapy [facility] for tasks assessed/performed   ? ?  ?  ? ?  ? ? ?Past Medical History:  ?Diagnosis Date  ? Anxiety   ? Arthritis   ? Celiac sprue   ? CVA (cerebral vascular accident) (HCC) 02/2018  ? R thalamic CVA  ? GERD (gastroesophageal reflux disease)   ? Glaucoma   ? Hormone replacement therapy (HRT)   ? Hyperlipidemia   ? Hypertension   ? Hypoglycemia   ? Hypothyroidism   ? OAB (overactive bladder)   ? Osteoporosis   ? PONV (postoperative nausea and vomiting)   ? Raynaud's disease   ? Scoliosis   ? Seasonal allergies   ? Senile purpura (HCC)   ? 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 Right Cheek  ? 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 Bridge Left Nose, and Left Neck  ? 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 Right Cheek Tx: curret x 3 and 5FU  ? 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 Bridge of nose tx Cx3 and 5FU, Left neck Tx Cx3 and 5FU  ? Thrombophlebitis   ? ?Past Surgical History:  ?Procedure Laterality Date  ? ABDOMINAL HYSTERECTOMY  1979  ? CATARACT EXTRACTION, BILATERAL    ? knee surgery Left   ? VARICOSE VEIN SURGERY Left   ? XI ROBOTIC ASSISTED PARAESOPHAGEAL HERNIA REPAIR N/A 06/15/2021  ? Procedure: XI ROBOTIC ASSISTED PARAESOPHAGEAL HERNIA REPAIR, GASTROPEXY, UPPER ENDOSCOPY;  Surgeon: Berna Bue, MD;  Location: [facility];  Service: General;  Laterality: N/A;  ? ?Patient  Active Problem List  ? Diagnosis Date Noted  ? Urinary incontinence 11/29/2021  ? Multiple pulmonary nodules 11/29/2021  ? Hypertrophic toenail 11/29/2021  ? Confusion 10/25/2021  ? Vitamin B12 deficiency 10/25/2021  ? DVT (deep venous thrombosis) (HCC) 07/30/2021  ? Dysphagia, post-stroke   ? Hypoalbuminemia due to protein-calorie malnutrition (HCC)   ? Benign essential HTN   ? Dyslipidemia   ? Delirium due to multiple etiologies 06/17/2021  ? S/P repair of paraesophageal hernia 06/15/2021  ? CVA (cerebral vascular accident) (HCC) 02/09/2018  ? Hypothyroidism 02/08/2018  ? Essential hypertension 02/08/2018  ? Acute ischemic stroke (HCC) 02/08/2018  ? Chronic urinary tract infection 02/08/2018  ? Acute cystitis without hematuria   ? Right thalamic stroke [facility])   ? Hyperlipidemia   ? ? ?ONSET DATE: 06/19/21 ? 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 ?THERAPY DIAG:  ?Other symptoms and signs involving cognitive functions following cerebral infarction ? ?Homonymous bilateral field defects, right side ? ?Unsteadiness on feet ? ?Muscle weakness (generalized) ? ?Abnormal posture ? ?SUBJECTIVE:  ? ?SUBJECTIVE STATEMENT: ?Denies pain ?Pt accompanied by: self ? ?PERTINENT HISTORY: past medical history for arthritis, anxiety, hypertension,  hyperlipidemia, hormone replacement therapy, glaucoma, gastroesophageal flux disease, remote right thalamic stroke in May 2019 due to small vessel disease, scoliosis, vascular dementia ? ?PRECAUTIONS: Fall and Other: no driving, Rt homonymous hemianopsia ? ?WEIGHT BEARING RESTRICTIONS No ? ?PAIN:  ?Are you having pain? No ? ?FALLS: Has patient fallen in last 6 months? Yes, Number of falls: 2 ? ?LIVING ENVIRONMENT: ?Lives with: lives with their spouse ?Lives in: House/apartment ?Stairs: Yes: External: 2 steps; can reach both ?Has following equipment at home: Single point cane, Walker - 2 wheeled, and bed side commode ? ?PLOF:  Independent with basic ADLs (prior to 06/2021) ? ?PATIENT GOALS I want to clean my house ? ?OBJECTIVE:  ? ?HAND DOMINANCE: Right ? ?ADLs: ?Transfers/ambulation related to ADLs: ?Eating: assist to cutting food ?Grooming: IND ?UB Dressing: Set up ?LB Dressing: Set up, occasional assist ?Toileting: Close supervision  ?Bathing: mod assist  ?Tub Shower transfers: walk in shower, min assist ?Equipment: Grab bars and Walk in shower, built in shower seat ? ? ?IADLs: ?Shopping: dependent ?Light housekeeping: dependent ?Meal Prep: dependent ?Community mobility: relies on family/husband ?Medication management: pill box, husband does ?Financial management: dependent ?Handwriting: 100% legible ? ?MOBILITY STATUS: Needs Assist: walker and close supervision ? ?POSTURE COMMENTS:  ?rounded shoulders, forward head, and increased thoracic kyphosis ? ? ?UE ROM   BUE AROM WNL's ? ?Active ROM Right ?12/28/2021 Left ?12/28/2021  ?Shoulder flexion    ?Shoulder abduction    ?Shoulder adduction    ?Shoulder extension    ?Shoulder internal rotation    ?Shoulder external rotation    ?Elbow flexion    ?Elbow extension    ?Wrist flexion    ?Wrist extension    ?Wrist ulnar deviation    ?Wrist radial deviation    ?Wrist pronation    ?Wrist supination    ?(Blank rows = not tested) ? ? ?UE MMT:   BUE MMT grossly 4/5, ? Rt slightly weaker but inconsistent ? ?MMT Right ?12/28/2021 Left ?12/28/2021  ?Shoulder flexion    ?Shoulder abduction    ?Shoulder adduction    ?Shoulder extension    ?Shoulder internal rotation    ?Shoulder external rotation    ?Middle trapezius    ?Lower trapezius    ?Elbow flexion    ?Elbow extension    ?Wrist flexion    ?Wrist extension    ?Wrist ulnar deviation    ?Wrist radial deviation    ?Wrist pronation    ?Wrist supination    ?(Blank rows = not tested) ? ?HAND FUNCTION: ?Grip strength: Right: 44.7 lb lbs; Left: 40.1 lbs ? ?COORDINATION: ?9 Hole Peg test: Right: 52.94 (slower d/t visual and cognitive deficits) sec; Left: 33 sec  (slower d/t visual/cognitive deficits)  ? ?SENSATION: ?WFL ? ?EDEMA: none ? ? ?COGNITION: ?Overall cognitive status: History of cognitive impairments - at baseline, however has gotten worse since more recent stroke in Fall 2022. Pt's husband reports delirium, halluncinations, short term memory, difficulty navigating in familiar home, perseverates on topics. Pt has vascular dementia ? ?VISION: ?Baseline vision: Bifocals and Wears glasses all the time ?Visual history:  none, ? cataracts ? ?VISION ASSESSMENT: ?Visual Fields: Right homonymous hemianopsia ? ?Patient has difficulty with following activities due to following visual impairments: all functional tasks  ? ?PERCEPTION: Impaired: Spatial orientation:   ? ?PRAXIS: Not tested ? ? ? ? ? ?GOALS: ? ?SHORT TERM GOALS: Target date: 01/25/2022 ? ?Pt will be independent with HEP targeting UE strength/endurance ? ? ?Goal status: INITIAL ? ?2.  Pt to  consistently dress self with set up only ?Baseline: occasional min assist ?Goal status: INITIAL ? ?3.  Pt to bathe self I'ly while seated w/ supervison ?Baseline: mod assist ?Goal status: INITIAL ? ?4.  Pt/family to verbalize understanding with visual strategies and memory strategies ?Baseline: dep ?Goal status: INITIAL ? ? ? ?LONG TERM GOALS: Target date: 02/22/2022 ? ?Pt to get simple snack and/or heat up microwaveable item w/ supervision ?Baseline: dep ?Goal status: INITIAL ? ?2.  Pt to sort and fold laundry w/ supervision/min cues ?Baseline: dep ?Goal status: INITIAL ? ?3.  Pt to perform environmental scanning while safely navigating walker in gym w/ min cues finding 5/10 items ?Baseline: unknown ?Goal status: INITIAL ? ?4.  Pt to follow simple 2 step directions for functional tasks  ?Baseline: unknown ?Goal status: INITIAL ? ?ASSESSMENT: ?CLINICAL IMPRESSION: ?Patient is a 86 y.o. female who was seen today for occupational therapy evaluation for Left PCA distribution infarct with right homonymous hemianopsia right neglect  and cognitive deficits. Stroke was on 06/19/21, however pt had previous stroke in May 2019 (Rt thalamic stroke) which husband reports pt made approx 90% recovery from. PMH:  arthritis, anxiety, hypertension,

## 2021-12-28 NOTE — Therapy (Signed)
?OUTPATIENT PHYSICAL THERAPY NEURO EVALUATION ? ? ?Patient Name: Caroline [name] ?MRN: [id number] ?DOB:1936-03-20, 86 y.o., female ?Today's Date: 12/28/2021 ? ?PCP: Carlean Jews, NP ?REFERRING PROVIDER: Erick Colace, MD  ? ? PT End of Session - 12/28/21 1242   ? ? Visit Number 1   ? Number of Visits 17   plus eval  ? Date for PT Re-Evaluation 03/22/22   ? Authorization Type [employer]   ? Progress Note Due on Visit 10   ? PT Start Time 1105   ? PT Stop Time 1150   ? PT Time Calculation (min) 45 min   ? Equipment Utilized During Treatment Gait belt   ? Activity Tolerance Patient tolerated treatment well   ? Behavior During Therapy [facility] for tasks assessed/performed   ? ?  ?  ? ?  ? ? ?Past Medical History:  ?Diagnosis Date  ? Anxiety   ? Arthritis   ? Celiac sprue   ? CVA (cerebral vascular accident) (HCC) 02/2018  ? R thalamic CVA  ? GERD (gastroesophageal reflux disease)   ? Glaucoma   ? Hormone replacement therapy (HRT)   ? Hyperlipidemia   ? Hypertension   ? Hypoglycemia   ? Hypothyroidism   ? OAB (overactive bladder)   ? Osteoporosis   ? PONV (postoperative nausea and vomiting)   ? Raynaud's disease   ? Scoliosis   ? Seasonal allergies   ? Senile purpura (HCC)   ? 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 Right Cheek  ? 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 Bridge Left Nose, and Left Neck  ? 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 Right Cheek Tx: curret x 3 and 5FU  ? 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 Bridge of nose tx Cx3 and 5FU, Left neck Tx Cx3 and 5FU  ? Thrombophlebitis   ? ?Past Surgical History:  ?Procedure Laterality Date  ? ABDOMINAL HYSTERECTOMY  1979  ? CATARACT EXTRACTION, BILATERAL    ? knee surgery Left   ? VARICOSE VEIN SURGERY Left   ? XI ROBOTIC ASSISTED PARAESOPHAGEAL HERNIA REPAIR N/A 06/15/2021  ? Procedure: XI ROBOTIC ASSISTED PARAESOPHAGEAL HERNIA REPAIR, GASTROPEXY, UPPER ENDOSCOPY;  Surgeon: Berna Bue, MD;  Location: [facility];  Service: General;  Laterality: N/A;  ? ?Patient Active Problem List  ? Diagnosis Date Noted  ? Urinary incontinence 11/29/2021  ? Multiple pulmonary nodules 11/29/2021  ? Hypertrophic toenail 11/29/2021  ? Confusion 10/25/2021  ? Vitamin B12 deficiency 10/25/2021  ? DVT (deep venous thrombosis) (HCC) 07/30/2021  ? Dysphagia, post-stroke   ? Hypoalbuminemia due to protein-calorie malnutrition (HCC)   ? Benign essential HTN   ? Dyslipidemia   ? Delirium due to multiple etiologies 06/17/2021  ? S/P repair of paraesophageal hernia 06/15/2021  ? CVA (cerebral vascular accident) (HCC) 02/09/2018  ? Hypothyroidism 02/08/2018  ? Essential hypertension 02/08/2018  ? Acute ischemic stroke (HCC) 02/08/2018  ? Chronic urinary tract infection 02/08/2018  ? Acute cystitis without hematuria   ? Right thalamic stroke [facility])   ? Hyperlipidemia   ? ? ?ONSET DATE: 12/10/2021 (referral)  ? 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 ?THERAPY DIAG:  ?Unsteadiness on feet ? ?Repeated falls ? ?Other abnormalities of gait and mobility ? ?Abnormal posture ? ?SUBJECTIVE:  ?                                                                                                                                                                                           ? ?  SUBJECTIVE STATEMENT: ?Majority of subjective obtained from husband w/pt's consent. Pt experienced second CVA in 06/2021. Completed stay in [facility] and has been home with husband who is primary caretaker. Per husband, pt's cognition has declined recently, unsure if due to new medications or if pt has another UTI. Pt has had multiple falls at home in which she does not remember, often due to her reaching for objects on ground or misuse of RW in home.  ? ?Pt accompanied by: significant other- husband  ? ?PERTINENT HISTORY: Left PCA distribution infarct with right homonymous hemianopsia right neglect and  cognitive deficits  ? ?PAIN:  ?Are you having pain? No ? ?PRECAUTIONS: Fall ? ?WEIGHT BEARING RESTRICTIONS No ? ?FALLS: Has patient fallen in last 6 months? Yes, Number of falls: pt's husband reports multiple, often due to pt forgetting to use RW or using it improperly in home.  ? ?LIVING ENVIRONMENT: ?Lives with: lives with their spouse ?Lives in: House/apartment ?Stairs: Yes: External: 2 steps; none ?Has following equipment at home: Dan Humphreys - 2 wheeled, shower chair, and bed side commode ? ?PLOF: Independent and Independent with basic ADLs ? ?PATIENT GOALS "Build up strength and endurance"  ? ?OBJECTIVE:  ? ?DIAGNOSTIC FINDINGS: An MRI scan of the brain was obtained which did not show any acute abnormality and showed old left PCA infarct.  ? ?COGNITION: ?Overall cognitive status: History of cognitive impairments - at baseline ?  ?SENSATION: ?WFL ? ?COORDINATION: ?Heel to shin test WNL bilaterally  ? ?POSTURE: rounded shoulders, forward head, increased thoracic kyphosis, and scoliosis  ? ?MMT:   ? ?MMT Right ?12/28/2021 Left ?12/28/2021  ?Hip flexion 4- 4  ?Hip extension    ?Hip abduction 4 4  ?Hip adduction 4 4  ?Hip internal rotation    ?Hip external rotation    ?Knee flexion 4- 4  ?Knee extension 4- 4  ?Ankle dorsiflexion 4 4  ?Ankle plantarflexion 4 4  ?Ankle inversion    ?Ankle eversion    ?(Blank rows = not tested) ? ?TRANSFERS: ?Assistive device utilized: Environmental consultant - 2 wheeled  ?Sit to stand: CGA, noted significant posterior lean w/stand ?Stand to sit: CGA ?Chair to chair: CGA and Min A ? ? ?GAIT: ?Gait pattern: decreased step length- Right, decreased step length- Left, decreased stride length, Right foot flat, Left foot flat, ataxic, trunk flexed, narrow BOS, poor foot clearance- Right, and poor foot clearance- Left ?Distance walked: various clinic distances  ?Assistive device utilized: Environmental consultant - 2 wheeled ?Level of assistance: CGA and Min A ?Comments: Pt demonstrates significant kyphosis 2/2 scoliosis and has  difficulty maintaining safe distance to RW. Due to R peripheral vision loss, pt laterally deviates to L side and frequently comes into contact with obstacles on R side even with verbal cues.  ? ?FUNCTIONAL TESTs:  ?Gait velocity: 22.45s w/RW = 1.46 ft/s (recurrent fall risk and limited community ambulator) ? ?PATIENT SURVEYS:  ?FOTO 57% ? ?TODAY'S TREATMENT:  ?See clinical impression statement  ? ? ?PATIENT EDUCATION: ?Education details: POC, goals, trying posterior walker for improved safety  ?Person educated: Patient and Spouse ?Education method: Explanation and Demonstration ?Education comprehension: verbalized understanding, returned demonstration, verbal cues required, and needs further education ? ? ?HOME EXERCISE PROGRAM: ?To be established next session  ? ? ?GOALS: ?Goals reviewed with patient? Yes ? ?SHORT TERM GOALS: Target date: 01/25/2022 ? ?Pt will perform initial HEP w/min A from husband for improved strength, balance, transfers and gait. ? ?Baseline:  ?Goal status: INITIAL ? ?2.  Update goal when  Berg complete and write LTG as appropriate  ?Baseline:  ?Goal status: INITIAL ? ?3.  Update goal when 5x STS complete and write LTG as appropriate  ?Baseline:  ?Goal status: INITIAL ? ?4.  Pt and caregiver will report 4 or fewer falls since eval for reduced fall frequency and improved safety awareness at home ?Baseline:  ?Goal status: INITIAL ? ?5.  Pt will trial use of posterior walker for improved balance and safety with functional mobility  ?Baseline: currently using RW ?Goal status: INITIAL ? ? ?LONG TERM GOALS: Target date: 02/22/2022 ? ?Pt will perform final HEP w/min A from husband for improved strength, balance, transfers and gait. ? ?Baseline:  ?Goal status: INITIAL ? ?2.  Pt will improve gait velocity to at least 1.9 ft/s with LRAD for improved gait efficiency and performance at community ambulator level without recurrent fall risk ? ?Baseline: 1.46 ft/s with RW ?Goal status: INITIAL ? ?3.  Update  goal when Sharlene MottsBerg performed  ?Baseline:  ?Goal status: INITIAL ? ?4.  Update goal when 5x STS performed  ?Baseline:  ?Goal status: INITIAL ? ?5.  Pt and caregiver will teach back techniques for fall prevention i

## 2021-12-28 NOTE — Patient Instructions (Signed)
   Checkers Chess Connect 4 Uno  Card games Jig saw puzzles Easy cross words Memory match Board games [employer] Learn a new game!  Listen to and discuss Ted Talks or Podcasts Read and discuss short articles of interest to you- Take notes on these if memory is a challenge Discuss social media posts Look and discuss photo albums  The best activities to improve cognition are functional, real life activities that are important to you:  Plan a menu Participate in household chores and decisions (with supervision) Participate in managing finances Plan a party, trip or tailgate with all of the details (even if you aren't really going to carry it out) Participate in your hobby as you are able with assistance Manage your texts, emails with supervision if needed. Google search for items (even if you're not really going to buy anything) and compare prices and features Socialize -  however, too many visitors can be overwhelming, so set limits "My doctor said I should only visit (or talk) for 20 minutes" or "I do better when I visit with just 1-2 people at a time for 20 minutes"    It's good to use real in-person games, not just apps  Apps:  [employer] Elevate There are apps for most of the games listed above  

## 2022-01-04 ENCOUNTER — Telehealth: Payer: Self-pay | Admitting: Neurology

## 2022-01-04 NOTE — Telephone Encounter (Signed)
Spoke to MD states he wait till results of UA come back. If normal will DC namenda. ? ?Relayed message from Dr [name] to Caroline [name]. ?He has schedule UA test tomorrow 01/04/2022. Caroline [name] will call us was results are in.  ?

## 2022-01-04 NOTE — Telephone Encounter (Signed)
Spoke to husband  ?Reports pt's behavior, mood, and memory has worsen with the last 2-3 weeks,  ?Husband is not sure pt should continue Namenda,  ?Pt does have a hx of frequent UTI. Last UA and UA culture was last month and results came back nx. PCP d/c prophylactic antibiotic at that time.  ?Advise husband to contact pcp today for urine test. ?Please advise if pt should continue Namenda.   ?

## 2022-01-04 NOTE — Telephone Encounter (Signed)
Pt husband has called to report that pt is out of her memantine (NAMENDA) 10 MG tablet, after today. He is asking if pt should stop this medication or if this will be refilled. ?

## 2022-01-05 ENCOUNTER — Encounter: Payer: Self-pay | Admitting: Nurse Practitioner

## 2022-01-05 ENCOUNTER — Ambulatory Visit (INDEPENDENT_AMBULATORY_CARE_PROVIDER_SITE_OTHER): Payer: Medicare Other | Admitting: Nurse Practitioner

## 2022-01-05 VITALS — BP 119/66 | HR 100 | Temp 97.8°F | Ht 64.0 in | Wt 141.8 lb

## 2022-01-05 DIAGNOSIS — N390 Urinary tract infection, site not specified: Secondary | ICD-10-CM

## 2022-01-05 DIAGNOSIS — F05 Delirium due to known physiological condition: Secondary | ICD-10-CM

## 2022-01-05 DIAGNOSIS — I10 Essential (primary) hypertension: Secondary | ICD-10-CM

## 2022-01-05 LAB — POCT URINALYSIS DIP (CLINITEK)
Bilirubin, UA: NEGATIVE
Blood, UA: NEGATIVE
Glucose, UA: NEGATIVE mg/dL
Ketones, POC UA: NEGATIVE mg/dL
Nitrite, UA: NEGATIVE
POC PROTEIN,UA: NEGATIVE
Spec Grav, UA: 1.025 (ref 1.010–1.025)
Urobilinogen, UA: 0.2 E.U./dL
pH, UA: 6.5 (ref 5.0–8.0)

## 2022-01-05 MED ORDER — NITROFURANTOIN MONOHYD MACRO 100 MG PO CAPS: ORAL_CAPSULE | ORAL | 2 refills | Status: DC

## 2022-01-05 MED ORDER — IRBESARTAN 75 MG PO TABS: ORAL_TABLET | ORAL | 1 refills | Status: DC

## 2022-01-05 NOTE — Progress Notes (Signed)
Patient started back on macrobid  100mg  twice daily. Will take for 7 days then take daily for uti prevention. Recheck urine in two weeks

## 2022-01-05 NOTE — Progress Notes (Signed)
Established patient visit ? ? ?Patient: Caroline [name]   DOB: [birth year]   86 y.o. Female  MRN: [id number] ?Visit Date: 01/05/2022 ? ?Chief Complaint  ?Patient presents with  ? Urinary Tract Infection  ? ?Subjective  ?  ?HPI  ?The patient is concerned about possibility of urinary tract infection.  ?-new onset of confusion. This coincided with new prescription of "brain" medication to help with memory and dementia. Neurologist suggested she be checked for uti. Today, she has moderate WBC in the urine.  ?-has been on macrobid as preventive which did eliminate bacteriuria. Urologist weaned this down and off figuring that patient did not need this any longer. Patient's husband states he would not like to go back to particular urologist.  ?-hypertension  ? ?Medications: ?Outpatient Medications Prior to Visit  ?Medication Sig  ? acetaminophen (TYLENOL) 325 MG tablet Take 2 tablets (650 mg total) by mouth every 6 (six) hours as needed for mild pain, moderate pain, fever or headache.  ? Ascorbic Acid (VITAMIN C) 1000 MG tablet Take 1,000 mg by mouth daily.  ? atorvastatin (LIPITOR) 80 MG tablet Take 1 tablet (80 mg total) by mouth at bedtime.  ? Catheters MISC Use as directed. 1 box of inserts and 1 box canisters.  ? Cholecalciferol (VITAMIN D-3) 25 MCG (1000 UT) CAPS Take 1 capsule (1,000 Units total) by mouth daily.  ? Coenzyme Q10 (COQ10 PO) Take 1 tablet by mouth daily.  ? ELIQUIS 5 MG TABS tablet TAKE 1 TABLET BY MOUTH 2 TIMES DAILY.  ? furosemide (LASIX) 20 MG tablet Take 1 tablet (20 mg total) by mouth daily as needed for edema.  ? levothyroxine (SYNTHROID) 125 MCG tablet TAKE 1 TABLET (125 MCG TOTAL) BY MOUTH DAILY AT 6 AM.  ? meclizine (ANTIVERT) 25 MG tablet Take 0.5 tablets (12.5 mg total) by mouth 3 (three) times daily as needed for dizziness.  ? memantine (NAMENDA TITRATION PAK) tablet pack 5 mg/day for =1 week; 5 mg twice daily for =1 week; 15 mg/day given in 5 mg and 10 mg separated doses for =1 week; then 10 mg  twice daily (Patient not taking: Reported on 01/06/2022)  ? ondansetron (ZOFRAN) 4 MG tablet Take 1 tablet (4 mg total) by mouth every 6 (six) hours.  ? pantoprazole (PROTONIX) 40 MG tablet Take 1 tablet po QD  ? QUEtiapine (SEROQUEL) 25 MG tablet Take 25 mg by mouth at bedtime.  ? Vibegron (GEMTESA) 75 MG TABS Take 75 mg by mouth daily.  ? vitamin B-12 (CYANOCOBALAMIN) 250 MCG tablet Take 1 tablet (250 mcg total) by mouth daily.  ? VITAMIN D PO Take 250 mg by mouth.  ? [DISCONTINUED] irbesartan (AVAPRO) 75 MG tablet Take 1 tablet (75 mg total) by mouth daily.  ? [DISCONTINUED] nitrofurantoin, macrocrystal-monohydrate, (MACROBID) 100 MG capsule Take 1 capsule (100 mg total) by mouth daily.  ? memantine (NAMENDA) 10 MG tablet Take 1 tablet (10 mg total) by mouth 2 (two) times daily. (Patient not taking: Reported on 01/05/2022)  ? [DISCONTINUED] nitrofurantoin (MACRODANTIN) 50 MG capsule Take 1 capsule (50 mg total) by mouth daily. (Patient not taking: Reported on 01/05/2022)  ? ?No facility-administered medications prior to visit.  ? ? ?Review of Systems  ?Constitutional:  Positive for activity change and fatigue. Negative for appetite change, chills and fever.  ?HENT:  Negative for congestion, postnasal drip, rhinorrhea, sinus pressure, sinus pain, sneezing and sore throat.   ?Eyes: Negative.   ?Respiratory:  Negative for cough, chest tightness, shortness of breath  and wheezing.   ?Cardiovascular:  Negative for chest pain and palpitations.  ?Gastrointestinal:  Negative for abdominal pain, constipation, diarrhea, nausea and vomiting.  ?Endocrine: Negative for cold intolerance, heat intolerance, polydipsia and polyuria.  ?Genitourinary:  Negative for dyspareunia, dysuria, flank pain, frequency and urgency.  ?     Recurrent UTI  ?Musculoskeletal:  Negative for arthralgias, back pain and myalgias.  ?Skin:  Negative for rash.  ?Allergic/Immunologic: Negative for environmental allergies.  ?Neurological:  Positive for weakness.  Negative for dizziness and headaches.  ?     Confusion, signs of dementia present.  ?Hematological:  Negative for adenopathy.  ?Psychiatric/Behavioral:  Positive for dysphoric mood. The patient is nervous/anxious.   ? ? Objective  ?  ? ?Today's Vitals  ? 01/05/22 1036  ?BP: 119/66  ?Pulse: 100  ?Temp: 97.8 ?F (36.6 ?C)  ?SpO2: 93%  ?Weight: 141 lb 12.8 oz (64.3 kg)  ?Height: 5\' 4"  (1.626 m)  ? ?Body mass index is 24.34 kg/m?.  ? ?Physical Exam ?Vitals and nursing note reviewed.  ?Constitutional:   ?   Appearance: Normal appearance. She is well-developed.  ?HENT:  ?   Head: Normocephalic and atraumatic.  ?   Mouth/Throat:  ?   Mouth: Mucous membranes are moist.  ?   Pharynx: Oropharynx is clear.  ?Eyes:  ?   Extraocular Movements: Extraocular movements intact.  ?   Conjunctiva/sclera: Conjunctivae normal.  ?   Pupils: Pupils are equal, round, and reactive to light.  ?Cardiovascular:  ?   Rate and Rhythm: Normal rate and regular rhythm.  ?   Pulses: Normal pulses.  ?   Heart sounds: Normal heart sounds.  ?Pulmonary:  ?   Effort: Pulmonary effort is normal.  ?   Breath sounds: Normal breath sounds.  ?Abdominal:  ?   Palpations: Abdomen is soft.  ?Genitourinary: ?   Comments: Urine sample positive for moderate WBC  ?Musculoskeletal:     ?   General: Normal range of motion.  ?   Cervical back: Normal range of motion and neck supple.  ?Lymphadenopathy:  ?   Cervical: No cervical adenopathy.  ?Skin: ?   General: Skin is warm and dry.  ?   Capillary Refill: Capillary refill takes less than 2 seconds.  ?Neurological:  ?   General: No focal deficit present.  ?   Mental Status: She is alert. Mental status is at baseline. She is disoriented.  ?Psychiatric:     ?   Mood and Affect: Mood normal.     ?   Behavior: Behavior normal.     ?   Thought Content: Thought content normal.     ?   Judgment: Judgment normal.  ?  ? ? ?Results for orders placed or performed in visit on 01/05/22  ?Urine Culture  ? Specimen: Urine  ? Urine   ?Result Value Ref Range  ? Urine Culture, Routine Final report   ? Organism ID, Bacteria Comment   ?POCT URINALYSIS DIP (CLINITEK)  ?Result Value Ref Range  ? Color, UA yellow yellow  ? Clarity, UA cloudy (A) clear  ? Glucose, UA negative negative mg/dL  ? Bilirubin, UA negative negative  ? Ketones, POC UA negative negative mg/dL  ? Spec Grav, UA 1.025 1.010 - 1.025  ? Blood, UA negative negative  ? pH, UA 6.5 5.0 - 8.0  ? POC PROTEIN,UA negative negative, trace  ? Urobilinogen, UA 0.2 0.2 or 1.0 E.U./dL  ? Nitrite, UA Negative Negative  ? Leukocytes, UA  Moderate (2+) (A) Negative  ? ? Assessment & Plan  ?  ?1. Urinary tract infection without hematuria, site unspecified ?Start macrobid 100mg  twice daily for 7 days. Send urine sample for culture and sensitivity and adjust antibiotics as indicated  ?- POCT URINALYSIS DIP (CLINITEK) ?- Urine Culture; Future ?- Urine Culture ? ?2. Chronic urinary tract infection ?After completion of initial 7 days macrobid, take daily to prevent recurrent infection.  ?- nitrofurantoin, macrocrystal-monohydrate, (MACROBID) 100 MG capsule; Take 1 capsule po BID for 7 days then take daily for prevention of UTI.  Dispense: 37 capsule; Refill: 2 ? ?3. Essential hypertension ?Stable. Continue bp medication as prescribed  ?- irbesartan (AVAPRO) 75 MG tablet; Take 1 tablet po QAM and take 1/2 tablet po Qpm  Dispense: 45 tablet; Refill: 1 ? ?4. Delirium due to multiple etiologies ?Worse today, likely due to new uti. Treat infection. Patient should follow up with neurology   ? ?Return in about 2 weeks (around 01/19/2022) for uti - will need to check urine. .  ?   ? ? ? ?Ronnell Freshwater, NP  ?[facility] ?[phone] (phone) ?[phone] (fax) ? ?[facility] ?

## 2022-01-06 ENCOUNTER — Ambulatory Visit: Payer: Medicare Other | Admitting: Occupational Therapy

## 2022-01-06 ENCOUNTER — Ambulatory Visit: Payer: Medicare Other | Attending: Nurse Practitioner

## 2022-01-06 ENCOUNTER — Ambulatory Visit: Payer: Medicare Other | Admitting: Speech Pathology

## 2022-01-06 VITALS — BP 132/72

## 2022-01-06 DIAGNOSIS — H8112 Benign paroxysmal vertigo, left ear: Secondary | ICD-10-CM | POA: Diagnosis present

## 2022-01-06 DIAGNOSIS — R2689 Other abnormalities of gait and mobility: Secondary | ICD-10-CM | POA: Diagnosis present

## 2022-01-06 DIAGNOSIS — H53461 Homonymous bilateral field defects, right side: Secondary | ICD-10-CM

## 2022-01-06 DIAGNOSIS — R41841 Cognitive communication deficit: Secondary | ICD-10-CM | POA: Diagnosis present

## 2022-01-06 DIAGNOSIS — R2681 Unsteadiness on feet: Secondary | ICD-10-CM

## 2022-01-06 DIAGNOSIS — I69318 Other symptoms and signs involving cognitive functions following cerebral infarction: Secondary | ICD-10-CM | POA: Diagnosis present

## 2022-01-06 DIAGNOSIS — R296 Repeated falls: Secondary | ICD-10-CM | POA: Insufficient documentation

## 2022-01-06 DIAGNOSIS — M6281 Muscle weakness (generalized): Secondary | ICD-10-CM

## 2022-01-06 NOTE — Therapy (Signed)
?OUTPATIENT PHYSICAL THERAPY TREATMENT NOTE ? ? ?Patient Name: Caroline [name] ?MRN: [id number] ?DOB:27-Nov-1935, 86 y.o., female ?Today's Date: 01/06/2022 ? ?PCP: Carlean Jews, NP ?REFERRING PROVIDER: Carlean Jews, NP ? ?END OF SESSION:  ? PT End of Session - 01/06/22 1016   ? ? Visit Number 2   ? Number of Visits 17   plus eval  ? Date for PT Re-Evaluation 03/22/22   ? Authorization Type [employer]   ? Progress Note Due on Visit 10   ? PT Start Time 1016   ? PT Stop Time 1100   ? PT Time Calculation (min) 44 min   ? Equipment Utilized During Treatment Gait belt   ? Activity Tolerance Patient tolerated treatment well   ? Behavior During Therapy [facility] for tasks assessed/performed   ? ?  ?  ? ?  ? ? ?Past Medical History:  ?Diagnosis Date  ? Anxiety   ? Arthritis   ? Celiac sprue   ? CVA (cerebral vascular accident) (HCC) 02/2018  ? R thalamic CVA  ? GERD (gastroesophageal reflux disease)   ? Glaucoma   ? Hormone replacement therapy (HRT)   ? Hyperlipidemia   ? Hypertension   ? Hypoglycemia   ? Hypothyroidism   ? OAB (overactive bladder)   ? Osteoporosis   ? PONV (postoperative nausea and vomiting)   ? Raynaud's disease   ? Scoliosis   ? Seasonal allergies   ? Senile purpura (HCC)   ? 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 Right Cheek  ? 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 Bridge Left Nose, and Left Neck  ? 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 Right Cheek Tx: curret x 3 and 5FU  ? 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 Bridge of nose tx Cx3 and 5FU, Left neck Tx Cx3 and 5FU  ? Thrombophlebitis   ? ?Past Surgical History:  ?Procedure Laterality Date  ? ABDOMINAL HYSTERECTOMY  1979  ? CATARACT EXTRACTION, BILATERAL    ? knee surgery Left   ? VARICOSE VEIN SURGERY Left   ? XI ROBOTIC ASSISTED PARAESOPHAGEAL HERNIA REPAIR N/A 06/15/2021  ? Procedure: XI ROBOTIC ASSISTED PARAESOPHAGEAL HERNIA REPAIR, GASTROPEXY, UPPER ENDOSCOPY;  Surgeon:  Berna Bue, MD;  Location: [facility];  Service: General;  Laterality: N/A;  ? ?Patient Active Problem List  ? Diagnosis Date Noted  ? Urinary incontinence 11/29/2021  ? Multiple pulmonary nodules 11/29/2021  ? Hypertrophic toenail 11/29/2021  ? Confusion 10/25/2021  ? Vitamin B12 deficiency 10/25/2021  ? DVT (deep venous thrombosis) (HCC) 07/30/2021  ? Dysphagia, post-stroke   ? Hypoalbuminemia due to protein-calorie malnutrition (HCC)   ? Benign essential HTN   ? Dyslipidemia   ? Delirium due to multiple etiologies 06/17/2021  ? S/P repair of paraesophageal hernia 06/15/2021  ? CVA (cerebral vascular accident) (HCC) 02/09/2018  ? Hypothyroidism 02/08/2018  ? Essential hypertension 02/08/2018  ? Acute ischemic stroke (HCC) 02/08/2018  ? Chronic urinary tract infection 02/08/2018  ? Acute cystitis without hematuria   ? Right thalamic stroke [facility])   ? Hyperlipidemia   ? ? 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 ?THERAPY DIAG:  ?Unsteadiness on feet ? ?Muscle weakness (generalized) ? ?Repeated falls ? ?PERTINENT HISTORY: Left PCA distribution infarct with right homonymous hemianopsia right neglect and cognitive deficits; ?  ? ?PRECAUTIONS: Falls ? ?SUBJECTIVE: Reports that determined she had  an UTI yesterday at MD visit. Prescribed an antibiotic for this. Reports it stops the memory medication yesterday. No falls to report since initial evaluation. ? ?PAIN:  ?Are you having pain? No ? ? ? ? ?TODAY'S TREATMENT:  ? ?GAIT: ?Gait pattern: decreased step length- Right, decreased step length- Left, decreased stride length, Right foot flat, Left foot flat, ataxic, trunk flexed, narrow BOS, poor foot clearance- Right, and poor foot clearance- Left ?Distance walked: clinic distances ?Assistive device utilized: Environmental consultantWalker - 2 wheeled ?Level of assistance: CGA to Min A ?Comments: Pt demonstrates significant kyphosis and has difficulty maintaining safe distance to  RW. Intermittent cues to stop with gait to step closer to RW. PT standing on R side of patient to engage patient in visual scanning on R and help maintain RW straight due  to R peripheral vision loss and frequent laterally deviation to L side  ? ? ? ? [facility] PT Assessment - 01/06/22 0001   ? ?  ? Standardized Balance Assessment  ? Standardized Balance Assessment Berg Balance Test   ?  ? Berg Balance Test  ? Sit to Stand Able to stand using hands after several tries   ? Standing Unsupported Able to stand 2 minutes with supervision   ? Sitting with Back Unsupported but Feet Supported on Floor or Stool Able to sit safely and securely 2 minutes   ? Stand to Sit Uses backs of legs against chair to control descent   ? Transfers Needs one person to assist   due to vision impairments  ? Standing Unsupported with Eyes Closed Able to stand 3 seconds   ? Standing Unsupported with Feet Together Able to place feet together independently and stand for 1 minute with supervision   ? From Standing, Reach Forward with Outstretched Arm Can reach forward >5 cm safely (2")   ? From Standing Position, Pick up Object from Floor Able to pick up shoe, needs supervision   ? From Standing Position, Turn to Look Behind Over each Shoulder Turn sideways only but maintains balance   ? Turn 360 Degrees Needs assistance while turning   ? Standing Unsupported, Alternately Place Feet on Step/Stool Needs assistance to keep from falling or unable to try   ? Standing Unsupported, One Foot in Front Needs help to step but can hold 15 seconds   ? Standing on One Leg Tries to lift leg/unable to hold 3 seconds but remains standing independently   ? Total Score 26   ? Sharlene MottsBerg comment: 26/56   ? ?  ?  ? ?  ?Frequent rest breaks required with completion of Berg Balance due to Fatigue.  ? ? ?Rest of session spent establishing initial HEP:  ?Access Code: [email or web address] ?URL: [email or web address] ?Date: 01/06/2022 ?Prepared by: Jethro BastosKaitlyn Noor Witte ? ?Exercises ?-  Sit to Stand with Armchair  - 1 x daily - 5 x weekly - 2 sets - 5 reps ?- Clamshell  - 1 x daily - 5 x weekly - 1 sets - 10 reps ?  ?  ?PATIENT EDUCATION: ?Education details: HEP; Berg Results  ?Person educated: Patient and Spouse ?Education method: Explanation and Demonstration ?Education comprehension: verbalized understanding, returned demonstration, verbal cues required, and needs further education ?  ?  ?HOME EXERCISE PROGRAM: ?To be established next session  ?  ?  ?GOALS: ?Goals reviewed with patient? Yes ?  ?SHORT TERM GOALS: Target date: 01/25/2022 ?  ?Pt will perform initial HEP w/min A from husband for improved strength, balance, transfers and gait. ?  ?  Baseline:  ?Goal status: INITIAL ?  ?2.  Pt will improve Berg Balance to >/= 30/56 to demonstrate improved standing balance and reduced fall risk ?Baseline: 26/56 ?Goal status: INITIAL ?  ?3.  Update goal when 5x STS complete and write LTG as appropriate  ?Baseline:  ?Goal status: INITIAL ?  ?4.  Pt and caregiver will report 4 or fewer falls since eval for reduced fall frequency and improved safety awareness at home ?Baseline:  ?Goal status: INITIAL ?  ?5.  Pt will trial use of posterior walker for improved balance and safety with functional mobility  ?Baseline: currently using RW ?Goal status: INITIAL ?  ?  ?LONG TERM GOALS: Target date: 02/22/2022 ?  ?Pt will perform final HEP w/min A from husband for improved strength, balance, transfers and gait. ?  ?Baseline:  ?Goal status: INITIAL ?  ?2.  Pt will improve gait velocity to at least 1.9 ft/s with LRAD for improved gait efficiency and performance at community ambulator level without recurrent fall risk ?  ?Baseline: 1.46 ft/s with RW ?Goal status: INITIAL ?  ?3.  Pt will improve Berg Balance to >/= 35/56 to demonstrate improved standing balance and reduced fall risk ?Baseline: 26/56 ?Goal status: INITIAL ?  ?4.  Update goal when 5x STS performed  ?Baseline:  ?Goal status: INITIAL ?  ?5.  Pt and caregiver  will teach back techniques for fall prevention in the home and proper fall recovery for improved safety  ?Baseline:  ?Goal status: INITIAL ?  ?5.  Pt will improve FOTO to 62% or greater for improved functional

## 2022-01-06 NOTE — Therapy (Signed)
?OUTPATIENT OCCUPATIONAL THERAPY TREATMENT NOTE & DISCHARGE ? ? ?Patient Name: Caroline [name] ?MRN: [id number] ?DOB:[birth year], 86 y.o., female ?Today's Date: 01/06/2022 ? ?PCP: Carlean JewsBoscia, Heather E, NP ?REFERRING PROVIDER: Erick ColaceKirsteins, Andrew E, MD  ? ?END OF SESSION:  ? OT End of Session - 01/06/22 1146   ? ? Visit Number 2   ? Number of Visits 17   ? Date for OT Re-Evaluation 02/22/22   ? Authorization Type [employer] MCR   ? Progress Note Due on Visit 10   ? OT Start Time 1147   ? OT Stop Time 1215   d/c session  ? OT Time Calculation (min) 28 min   ? Activity Tolerance Patient tolerated treatment well   ? Behavior [facility] for tasks assessed/performed   ? ?  ?  ? ?  ? ?OCCUPATIONAL THERAPY DISCHARGE SUMMARY ? ?Visits from Start of Care: 2 ? ?Current functional level related to goals / functional outcomes: ?Pt has not demonstrated any functional gains d/t limited visits and wishing to discharge this day. Unable to fully assess functional level related to goals / functional outcomes. ?  ?Remaining deficits: ?Pt continues to demonstrate significant right field cut and/or neglect. ?  ?Education / Equipment: ?Visual scanning strategies  ? ?Patient agrees to discharge. Patient goals were  unable to be assessed . Patient is being discharged due to the patient's request.. ? ? ? ? ? ?Past Medical History:  ?Diagnosis Date  ? Anxiety   ? Arthritis   ? Celiac sprue   ? CVA (cerebral vascular accident) (HCC) 02/2018  ? R thalamic CVA  ? GERD (gastroesophageal reflux disease)   ? Glaucoma   ? Hormone replacement therapy (HRT)   ? Hyperlipidemia   ? Hypertension   ? Hypoglycemia   ? Hypothyroidism   ? OAB (overactive bladder)   ? Osteoporosis   ? PONV (postoperative nausea and vomiting)   ? Raynaud's disease   ? Scoliosis   ? Seasonal allergies   ? Senile purpura (HCC)   ? 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 Right Cheek  ? 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 Bridge Left Nose, and Left Neck  ?  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 Right Cheek Tx: curret x 3 and 5FU  ? 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 Bridge of nose tx Cx3 and 5FU, Left neck Tx Cx3 and 5FU  ? Thrombophlebitis   ? ?Past Surgical History:  ?Procedure Laterality Date  ? ABDOMINAL HYSTERECTOMY  1979  ? CATARACT EXTRACTION, BILATERAL    ? knee surgery Left   ? VARICOSE VEIN SURGERY Left   ? XI ROBOTIC ASSISTED PARAESOPHAGEAL HERNIA REPAIR N/A 06/15/2021  ? Procedure: XI ROBOTIC ASSISTED PARAESOPHAGEAL HERNIA REPAIR, GASTROPEXY, UPPER ENDOSCOPY;  Surgeon: Berna Bueonnor, Chelsea A, MD;  Location: [facility];  Service: General;  Laterality: N/A;  ? ?Patient Active Problem List  ? Diagnosis Date Noted  ? Urinary incontinence 11/29/2021  ? Multiple pulmonary nodules 11/29/2021  ? Hypertrophic toenail 11/29/2021  ? Confusion 10/25/2021  ? Vitamin B12 deficiency 10/25/2021  ? DVT (deep venous thrombosis) (HCC) 07/30/2021  ? Dysphagia, post-stroke   ? Hypoalbuminemia due to protein-calorie malnutrition (HCC)   ? Benign essential HTN   ? Dyslipidemia   ? Delirium due to multiple etiologies 06/17/2021  ? S/P repair of paraesophageal hernia 06/15/2021  ? CVA (cerebral vascular accident) (HCC) 02/09/2018  ? Hypothyroidism 02/08/2018  ? Essential hypertension 02/08/2018  ?  Acute ischemic stroke (HCC) 02/08/2018  ? Chronic urinary tract infection 02/08/2018  ? Acute cystitis without hematuria   ? Right thalamic stroke [facility])   ? Hyperlipidemia   ? ? ?ONSET DATE: 06/19/21 ? 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 ?THERAPY DIAG:  ?Unsteadiness on feet ? ?Homonymous bilateral field defects, right side ? ?Muscle weakness (generalized) ? ?Other symptoms and signs involving cognitive functions following cerebral infarction ? ? ?PERTINENT HISTORY: past medical history for arthritis, anxiety, hypertension, hyperlipidemia, hormone replacement therapy, glaucoma, gastroesophageal  flux disease, remote right thalamic stroke in May 2019 due to small vessel disease, scoliosis, vascular dementia ? ?PRECAUTIONS: Fall and Other: no driving, Rt homonymous hemianopsia ? ?SUBJECTIVE: 2 ? ?PAIN:  ?Are you having pain? Yes: NPRS scale: 0/10 ?Pain location: back - scoliosis ?Pain description: not currently any pain but reports it gets pretty bad ?Aggravating factors: moving around ?Relieving factors: sitting ? ? ? ? ?OBJECTIVE:  ? ?TODAY'S TREATMENT: ?Pt's spouse wishes to discharge from OT at this time. Pt 's spouse reports that he is working on all of the goals and occupational therapy interventions at home. OT recommends continuing OT but discharging today d/t patient and spouse request.  ? ? ?PATIENT EDUCATION: ?Education details: Education on role and purpose of OT ?Person educated: Patient and Spouse ?Education method: Explanation ?Education comprehension: verbalized understanding ? ? ?GOALS: ?  ?SHORT TERM GOALS: Target date: 01/25/2022 ? ?Pt will be independent with HEP targeting UE strength/endurance ?Goal status: INITIAL ?  ?2.   Pt to consistently dress self with set up only ?Baseline: occasional min assist ?Goal status: INITIAL ?  ?3.   Pt to bathe self I'ly while seated w/ supervison ?Baseline: mod assist ?Goal status: INITIAL ?  ?4.   Pt/family to verbalize understanding with visual strategies and memory strategies ?Baseline: dep ?Goal status: INITIAL ?  ?  ?  ?LONG TERM GOALS: Target date: 02/22/2022 ?  ?Pt to get simple snack and/or heat up microwaveable item w/ supervision ?Baseline: dep ?Goal status: INITIAL ?  ?2.  Pt to sort and fold laundry w/ supervision/min cues ?Baseline: dep ?Goal status: INITIAL ?  ?3.  Pt to perform environmental scanning while safely navigating walker in gym w/ min cues finding 5/10 items ?Baseline: unknown ?Goal status: INITIAL ?  ?4.  Pt to follow simple 2 step directions for functional tasks  ?Baseline: unknown ?Goal status: INITIAL ?  ?ASSESSMENT: ?CLINICAL  IMPRESSION: ?Pt's spouse reports wanting to d/c from occupational therapy today. ?  ?PERFORMANCE DEFICITS in functional skills including ADLs, IADLs, coordination, strength, mobility, balance, endurance, decreased knowledge of precautions, decreased knowledge of use of DME, and UE functional use, cognitive skills including attention, learn, memory, orientation, perception, problem solving, safety awareness, sequencing, and thought, and psychosocial skills including environmental adaptation.  ?  ?IMPAIRMENTS are limiting patient from ADLs, IADLs, leisure, and social participation.  ?  ?COMORBIDITIES has co-morbidities such as vascular dementia, scoliosis  that affects occupational performance. Patient will benefit from skilled OT to address above impairments and improve overall function. ?  ?MODIFICATION OR ASSISTANCE TO COMPLETE EVALUATION: Min-Moderate modification of tasks or assist with assess necessary to complete an evaluation. ?  ?OT OCCUPATIONAL PROFILE AND HISTORY: Detailed assessment: Review of records and additional review of physical, cognitive, psychosocial history related to current functional performance. ?  ?CLINICAL DECISION MAKING: Moderate - several treatment options, min-mod task modification necessary ?  ?REHAB POTENTIAL: Fair (due to significant  visual and cognitive deficits, vascular dementia)  ?  ?EVALUATION COMPLEXITY: Moderate ?  ?  ?PLAN: ?OT FREQUENCY: 2x/week ?  ?OT DURATION: 8 weeks however may adapt/reduce due to progress ?  ?PLANNED INTERVENTIONS: self care/ADL training, therapeutic exercise, therapeutic activity, neuromuscular re-education, functional mobility training, patient/family education, cognitive remediation/compensation, visual/perceptual remediation/compensation, coping strategies training, and DME and/or AE instructions ?  ?RECOMMENDED OTHER SERVICES: none ?  ?CONSULTED AND AGREED WITH PLAN OF CARE: Patient and family member/caregiver ?  ?PLAN FOR NEXT SESSION: OT  discharge ?  ? ? ?Junious Dresser, OT ?01/06/2022, 12:24 PM ? ?  ? ?  ?

## 2022-01-06 NOTE — Patient Instructions (Signed)
Access Code: [email or web address] ?URL: [email or web address] ?Date: 01/06/2022 ?Prepared by: Jethro Bastos ? ?Exercises ?- Sit to Stand with Armchair  - 1 x daily - 5 x weekly - 2 sets - 5 reps ?- Clamshell  - 1 x daily - 5 x weekly - 1 sets - 10 reps ?

## 2022-01-06 NOTE — Patient Instructions (Addendum)
Memory Book: Consider pulling out some pictures of important people and memories. We can work together to put it together.  ? ? ?Daughters: Alvera Singh, Toniann Fail  ?  ? Wendy's son is Gerre Pebbles Hotel manager, helpful and you're close to him)  ? Holly's daughters Clair Gulling Tourist information centre manager, TEFL teacher for [employer]) ?and Leeroy Bock (super smart, Higher education careers adviser)  ? ?Favorites: ? ?Restaurants: [employer] ? ?Foods:  ? ?Stores:  ? ?Memories: [place], [place], [place] ? ? ?

## 2022-01-06 NOTE — Patient Instructions (Signed)
1. Look for the edge of objects (to the RIGHT) so that you make sure you are seeing all of an object ?2. Turn your head when walking, scan from side to side, particularly in busy environments ?3. Use an organized scanning pattern. It's usually easier to scan from top to bottom, and left to right (like you are reading) ?4. Double check yourself ?5. Use a line guide (like a blank piece of paper) or your finger when reading ?6. If necessary, place brightly colored tape at end of table or work area as a reminder to always look until you see the tape.   ?

## 2022-01-06 NOTE — Therapy (Signed)
?OUTPATIENT SPEECH LANGUAGE PATHOLOGY TREATMENT NOTE ? ? ?Patient Name: Caroline [name] ?MRN: [id number] ?DOB:January 12, 1936, 86 y.o., female ?Today's Date: 01/06/2022 ? ?PCP: Carlean Jews, NP ?REFERRING PROVIDER: Erick Colace, MD  ? ?END OF SESSION:  ? End of Session - 01/06/22 1100   ? ? Visit Number 2   ? Number of Visits 25   ? Date for SLP Re-Evaluation 03/22/22   ? SLP Start Time 1100   ? SLP Stop Time  1139   ? SLP Time Calculation (min) 39 min   ? Activity Tolerance Patient tolerated treatment well   ? ?  ?  ? ?  ? ? ?Past Medical History:  ?Diagnosis Date  ? Anxiety   ? Arthritis   ? Celiac sprue   ? CVA (cerebral vascular accident) (HCC) 02/2018  ? R thalamic CVA  ? GERD (gastroesophageal reflux disease)   ? Glaucoma   ? Hormone replacement therapy (HRT)   ? Hyperlipidemia   ? Hypertension   ? Hypoglycemia   ? Hypothyroidism   ? OAB (overactive bladder)   ? Osteoporosis   ? PONV (postoperative nausea and vomiting)   ? Raynaud's disease   ? Scoliosis   ? Seasonal allergies   ? Senile purpura (HCC)   ? 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 Right Cheek  ? 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 Bridge Left Nose, and Left Neck  ? 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 Right Cheek Tx: curret x 3 and 5FU  ? 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 Bridge of nose tx Cx3 and 5FU, Left neck Tx Cx3 and 5FU  ? Thrombophlebitis   ? ?Past Surgical History:  ?Procedure Laterality Date  ? ABDOMINAL HYSTERECTOMY  1979  ? CATARACT EXTRACTION, BILATERAL    ? knee surgery Left   ? VARICOSE VEIN SURGERY Left   ? XI ROBOTIC ASSISTED PARAESOPHAGEAL HERNIA REPAIR N/A 06/15/2021  ? Procedure: XI ROBOTIC ASSISTED PARAESOPHAGEAL HERNIA REPAIR, GASTROPEXY, UPPER ENDOSCOPY;  Surgeon: Berna Bue, MD;  Location: [facility];  Service: General;  Laterality: N/A;  ? ?Patient Active Problem List  ? Diagnosis Date Noted  ? Urinary incontinence 11/29/2021  ? Multiple pulmonary  nodules 11/29/2021  ? Hypertrophic toenail 11/29/2021  ? Confusion 10/25/2021  ? Vitamin B12 deficiency 10/25/2021  ? DVT (deep venous thrombosis) (HCC) 07/30/2021  ? Dysphagia, post-stroke   ? Hypoalbuminemia due to protein-calorie malnutrition (HCC)   ? Benign essential HTN   ? Dyslipidemia   ? Delirium due to multiple etiologies 06/17/2021  ? S/P repair of paraesophageal hernia 06/15/2021  ? CVA (cerebral vascular accident) (HCC) 02/09/2018  ? Hypothyroidism 02/08/2018  ? Essential hypertension 02/08/2018  ? Acute ischemic stroke (HCC) 02/08/2018  ? Chronic urinary tract infection 02/08/2018  ? Acute cystitis without hematuria   ? Right thalamic stroke [facility])   ? Hyperlipidemia   ? ? ?ONSET DATE: 06/15/2021 ? 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 ?THERAPY DIAG:  ?Cognitive communication deficit ? ?SUBJECTIVE: Pt attends session with spouse, Caroline [name], who reports pt hs been dx with UTI, and has d/c memory medication.  ? ?PAIN:  ?Are you having pain? No ? ? ?OBJECTIVE:  ? ?TODAY'S TREATMENT: ?01-06-22: Lead pt and spouse through activity in which pt recalls details about events, people, and places which are personally relevant and important to the patient. Education on purpose  and use of a memory/orientation book to assist w/ patient's memory impairment. Began generating a list of topics to include in book. Husband demonstrates appropriate cueing (semantic cues and questioning cues primarily) to encourage pt to recall important information. Caroline [name] tells ST he will try and gather photos to begin constructing memory book next session.  ? ?PATIENT EDUCATION: ?Education details: see above ?Person educated: Patient and Spouse ?Education method: Explanation and Verbal cues ?Education comprehension: verbal cues required and needs further education ?  ?  ?GOALS: ?Goals reviewed with patient? Yes ?  ?SHORT TERM GOALS: Target date: 01/25/2022 ?  ?Caregivers  will use 2 external aids for pt to find chair controls on the right side of her chair with 50% less cueing from spouse, subjectively ?  ?Goal status: ONGOING ?  ?2.  Caregivers will carryover 2 external aids to reduce pt from getting up with out asking for help 50% less subjectively by spouse ?Goal status: ONGOING ?  ?3.  Pt and Caregivers will use memory book for pt to recall 8 family names with usual min A  ?  ?Goal status: ONGOING ?  ?  ?  ?  ?LONG TERM GOALS: Target date: 03/22/2022 ?  ?Caregivers will use external aids for memory in 2 additional situations where safety for frustration are a concern ?  ?Goal status: ONGOING ?  ?2.  Caregivers and pt will use memory book to recall 2 pertinent facts about 5 family members with usual min A ?Goal status: ONGOING ?  ?3.  Caregivers/pt will use external memory aids or memory book to recall personally relevant places, names, trips etc over 4 sessions ?Goal status: ONGOING ?  ?4.  Caregivers/pt will use external aids to recall that she is home, with 50% reduction in confusion /frustration about this reported subjectively by spouse ?Goal status: ONGOING ?  ?5.  Pt will attend to the right side of her memory book with external aids and verbal cues to locate information 3/5x with occasional min A ?  ?Goal status: ONGOING ?  ?ASSESSMENT: ?  ?CLINICAL IMPRESSION: ?Caroline [name]  presents with impaired memory, orientation, attention, executive functioning. She had MCI dx since 2019. Significant cognitive impairment, impacting pt's ability to recall personally relevant information. Pt is currently requiring full time care. Caroline [name] is managing all finances, cooking, shopping and meds. He has care attendants a couple days a week who help with cleaning and bathing. Caroline [name] verbalizes poor awareness of her cognitive impairment, refernces Caroline [name] to fill in information many times throghout session. Right visual field cut affects her ability to attend to the right side. Caroline [name] reports she  has difficulty recalling/attending to the controls on the right bottom of her chair that help her get up and he repeatedly cues her for this. He also reports she gets up with out his knowing and he finds this a safety issue. I recommend skilled ST to maximize cognition, train caregivers in compensatory strategies to support memory in daily activities and use of external memory aids for safety and to reduce caregiver burden. ? ?OBJECTIVE IMPAIRMENTS include attention, memory, awareness, and executive functioning. These impairments are limiting patient from  making safe decisions and recalling pertinent information such as family names . ?Factors affecting potential to achieve goals and functional outcome are medical prognosis, previous level of function, and severity of impairments.. Patient will benefit from skilled SLP services to address above impairments and improve overall function. ?  ?REHAB POTENTIAL: Fair   ?  ?PLAN: ?SLP FREQUENCY: 2x/week ?  ?  SLP DURATION: 12 weeks ?  ?PLANNED INTERVENTIONS: Language facilitation, Environmental controls, Cueing hierachy, Cognitive reorganization, Internal/external aids, Functional tasks, Multimodal communication approach, SLP instruction and feedback, and Compensatory strategies ? ?Maia BreslowJessica L Helena Sardo, CF-SLP ?01/06/2022, 11:03 AM ?  ?

## 2022-01-07 LAB — URINE CULTURE

## 2022-01-10 NOTE — Progress Notes (Signed)
Please let the patient/husband know that urine dample showed overgrowth of normal flora. She should finish initial twice daily dosing then go to once daily dosing as we discussed during her visit. We will recheck urine at follow up 01/18/2022.  ?Thanks so much.   -HB

## 2022-01-18 ENCOUNTER — Ambulatory Visit: Payer: Medicare Other | Admitting: Nurse Practitioner

## 2022-01-19 ENCOUNTER — Ambulatory Visit: Payer: Medicare Other | Admitting: Speech Pathology

## 2022-01-19 ENCOUNTER — Ambulatory Visit: Payer: Medicare Other | Admitting: Physical Therapy

## 2022-01-19 DIAGNOSIS — H8112 Benign paroxysmal vertigo, left ear: Secondary | ICD-10-CM

## 2022-01-19 DIAGNOSIS — R2681 Unsteadiness on feet: Secondary | ICD-10-CM | POA: Diagnosis not present

## 2022-01-19 DIAGNOSIS — R41841 Cognitive communication deficit: Secondary | ICD-10-CM

## 2022-01-19 DIAGNOSIS — R2689 Other abnormalities of gait and mobility: Secondary | ICD-10-CM

## 2022-01-19 DIAGNOSIS — M6281 Muscle weakness (generalized): Secondary | ICD-10-CM

## 2022-01-19 NOTE — Patient Instructions (Signed)
Self Treatment for Left Posterior / Anterior Canalithiasis    Sitting on bed: 1. Turn head 45 left. (a) Lie back slowly, shoulders on pillow, head on bed. (b) Hold _20___ seconds. 2. Keeping head on bed, turn head 90 right. Hold __20__ seconds. 3. Roll to right, head on 45 angle down toward bed. Hold _20___ seconds. 4. Sit up on right side of bed. Repeat _3___ times per session. Do __2__ sessions per day.  Copyright  VHI. All rights reserved.   

## 2022-01-19 NOTE — Therapy (Signed)
?OUTPATIENT SPEECH LANGUAGE PATHOLOGY TREATMENT NOTE ? ? ?Patient Name: Caroline [name] ?MRN: [id number] ?DOB:1936-06-24, 86 y.o., female ?Today's Date: 01/19/2022 ? ?PCP: Carlean Jews, NP ?REFERRING PROVIDER: Erick Colace, MD  ? ?END OF SESSION:  ? End of Session - 01/19/22 1622   ? ? Visit Number 3   ? Number of Visits 25   ? Date for SLP Re-Evaluation 03/22/22   ? SLP Start Time 1315   ? SLP Stop Time  1400   ? SLP Time Calculation (min) 45 min   ? Activity Tolerance Patient tolerated treatment well   ? ?  ?  ? ?  ? ? ? ?Past Medical History:  ?Diagnosis Date  ? Anxiety   ? Arthritis   ? Celiac sprue   ? CVA (cerebral vascular accident) (HCC) 02/2018  ? R thalamic CVA  ? GERD (gastroesophageal reflux disease)   ? Glaucoma   ? Hormone replacement therapy (HRT)   ? Hyperlipidemia   ? Hypertension   ? Hypoglycemia   ? Hypothyroidism   ? OAB (overactive bladder)   ? Osteoporosis   ? PONV (postoperative nausea and vomiting)   ? Raynaud's disease   ? Scoliosis   ? Seasonal allergies   ? Senile purpura (HCC)   ? 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 Right Cheek  ? 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 Bridge Left Nose, and Left Neck  ? 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 Right Cheek Tx: curret x 3 and 5FU  ? 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 Bridge of nose tx Cx3 and 5FU, Left neck Tx Cx3 and 5FU  ? Thrombophlebitis   ? ?Past Surgical History:  ?Procedure Laterality Date  ? ABDOMINAL HYSTERECTOMY  1979  ? CATARACT EXTRACTION, BILATERAL    ? knee surgery Left   ? VARICOSE VEIN SURGERY Left   ? XI ROBOTIC ASSISTED PARAESOPHAGEAL HERNIA REPAIR N/A 06/15/2021  ? Procedure: XI ROBOTIC ASSISTED PARAESOPHAGEAL HERNIA REPAIR, GASTROPEXY, UPPER ENDOSCOPY;  Surgeon: Berna Bue, MD;  Location: [facility];  Service: General;  Laterality: N/A;  ? ?Patient Active Problem List  ? Diagnosis Date Noted  ? Urinary incontinence 11/29/2021  ? Multiple pulmonary  nodules 11/29/2021  ? Hypertrophic toenail 11/29/2021  ? Confusion 10/25/2021  ? Vitamin B12 deficiency 10/25/2021  ? DVT (deep venous thrombosis) (HCC) 07/30/2021  ? Dysphagia, post-stroke   ? Hypoalbuminemia due to protein-calorie malnutrition (HCC)   ? Benign essential HTN   ? Dyslipidemia   ? Delirium due to multiple etiologies 06/17/2021  ? S/P repair of paraesophageal hernia 06/15/2021  ? CVA (cerebral vascular accident) (HCC) 02/09/2018  ? Hypothyroidism 02/08/2018  ? Essential hypertension 02/08/2018  ? Acute ischemic stroke (HCC) 02/08/2018  ? Urinary tract infection without hematuria 02/08/2018  ? Acute cystitis without hematuria   ? Right thalamic stroke [facility])   ? Hyperlipidemia   ? ? ?ONSET DATE: 06/15/2021 ? 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 ?THERAPY DIAG:  ?Cognitive communication deficit ? ?SUBJECTIVE: "I have good days and bad days."  ? ?PAIN:  ?Are you having pain? No ? ? ?OBJECTIVE:  ? ?TODAY'S TREATMENT: ?SKILLED INTERVENTIONS: ?01-19-2022: Pt attends session with spouse and daughter. ST provides education and feedback on memory book design and strategies for usage at home. Daughter indicates she has begun collecting photos, they need to put together. ST recommends  keeping pictures per page limited and adding simple and direct captions to assist with pt recall. Discussion on using memory book to facilitate conversations, orient pt to place and time, and remind pt of family members and other personally relevant topics. Education and coaching on facilitating right field scanning to compensate for right neglect. Spouse reports increased occurences of pt being able to find chair controller which is on right side of chair. Difficulty with knowing chair vs. TV remote. Generate strategy of marking each with appropriate picture to provide visual aid to assist in recognizing tool for ease of use.  ? ?01-06-22: Lead pt and spouse through  activity in which pt recalls details about events, people, and places which are personally relevant and important to the patient. Education on purpose and use of a memory/orientation book to assist w/ patient's memory impairment. Began generating a list of topics to include in book. Husband demonstrates appropriate cueing (semantic cues and questioning cues primarily) to encourage pt to recall important information. [name] tells ST he will try and gather photos to begin constructing memory book next session.  ? ?PATIENT EDUCATION: ?Education details: see above ?Person educated: Patient and Spouse ?Education method: Explanation and Verbal cues ?Education comprehension: verbal cues required and needs further education ?  ?  ?GOALS: ?Goals reviewed with patient? Yes ?  ?SHORT TERM GOALS: Target date: 01/25/2022 ?  ?Caregivers will use 2 external aids for pt to find chair controls on the right side of her chair with 50% less cueing from spouse, subjectively ?Goal status: ONGOING ?  ?2.  Caregivers will carryover 2 external aids to reduce pt from getting up with out asking for help 50% less subjectively by spouse ?Goal status: ONGOING ?  ?3.  Pt and Caregivers will use memory book for pt to recall 8 family names with usual min A  ? Goal status: ONGOING ?  ?  ?  ?  ?LONG TERM GOALS: Target date: 03/22/2022 ?  ?Caregivers will use external aids for memory in 2 additional situations where safety for frustration are a concern ? Goal status: ONGOING ?  ?2.  Caregivers and pt will use memory book to recall 2 pertinent facts about 5 family members with usual min A ?Goal status: ONGOING ?  ?3.  Caregivers/pt will use external memory aids or memory book to recall personally relevant places, names, trips etc over 4 sessions ?Goal status: ONGOING ?  ?4.  Caregivers/pt will use external aids to recall that she is home, with 50% reduction in confusion /frustration about this reported subjectively by spouse ?Goal status: ONGOING ?  ?5.   Pt will attend to the right side of her memory book with external aids and verbal cues to locate information 3/5x with occasional min A ? Goal status: ONGOING ?  ?ASSESSMENT: ?  ?CLINICAL IMPRESSION: ?Caroline [name]  presents with impaired memory, orientation, attention, executive functioning. She had MCI dx since 2019. Significant cognitive impairment, impacting pt's ability to recall personally relevant information. Pt is currently requiring full time care. [name] is managing all finances, cooking, shopping and meds. He has care attendants a couple days a week who help with cleaning and bathing. Caroline [name] verbalizes poor awareness of her cognitive impairment, refernces Caroline [name] to fill in information many times throghout session. Right visual field cut affects her ability to attend to the right side. Caroline [name] reports she has difficulty recalling/attending to the controls on the right bottom of her chair that help her get up and he repeatedly cues her  for this. He also reports she gets up with out his knowing and he finds this a safety issue. I recommend skilled ST to maximize cognition, train caregivers in compensatory strategies to support memory in daily activities and use of external memory aids for safety and to reduce caregiver burden. ? ?OBJECTIVE IMPAIRMENTS include attention, memory, awareness, and executive functioning. These impairments are limiting patient from  making safe decisions and recalling pertinent information such as family names . ?Factors affecting potential to achieve goals and functional outcome are medical prognosis, previous level of function, and severity of impairments.. Patient will benefit from skilled SLP services to address above impairments and improve overall function. ?  ?REHAB POTENTIAL: Fair   ?  ?PLAN: ?SLP FREQUENCY: 2x/week ?  ?SLP DURATION: 12 weeks ?  ?PLANNED INTERVENTIONS: Language facilitation, Environmental controls, Cueing hierachy, Cognitive reorganization, Internal/external  aids, Functional tasks, Multimodal communication approach, SLP instruction and feedback, and Compensatory strategies ? ?Maia BreslowJessica L Aly Seidenberg, CCC-SLP ?01/19/2022, 4:23 PM ?  ?

## 2022-01-20 ENCOUNTER — Ambulatory Visit (INDEPENDENT_AMBULATORY_CARE_PROVIDER_SITE_OTHER): Payer: Medicare Other | Admitting: Nurse Practitioner

## 2022-01-20 ENCOUNTER — Encounter: Payer: Self-pay | Admitting: Nurse Practitioner

## 2022-01-20 VITALS — BP 153/66 | HR 86 | Temp 98.0°F | Ht 64.17 in | Wt 142.0 lb

## 2022-01-20 DIAGNOSIS — M25562 Pain in left knee: Secondary | ICD-10-CM

## 2022-01-20 DIAGNOSIS — N390 Urinary tract infection, site not specified: Secondary | ICD-10-CM | POA: Diagnosis not present

## 2022-01-20 DIAGNOSIS — I10 Essential (primary) hypertension: Secondary | ICD-10-CM

## 2022-01-20 DIAGNOSIS — G8929 Other chronic pain: Secondary | ICD-10-CM

## 2022-01-20 DIAGNOSIS — M25561 Pain in right knee: Secondary | ICD-10-CM

## 2022-01-20 LAB — POCT URINALYSIS DIP (CLINITEK)
Bilirubin, UA: NEGATIVE
Blood, UA: NEGATIVE
Glucose, UA: NEGATIVE mg/dL
Ketones, POC UA: NEGATIVE mg/dL
Leukocytes, UA: NEGATIVE
Nitrite, UA: NEGATIVE
POC PROTEIN,UA: NEGATIVE
Spec Grav, UA: >=1.03 — AB (ref 1.010–1.025)
Urobilinogen, UA: 0.2 E.U./dL
pH, UA: 5.5 (ref 5.0–8.0)

## 2022-01-20 MED ORDER — NITROFURANTOIN MACROCRYSTAL 50 MG PO CAPS: 50.0000 mg | ORAL_CAPSULE | Freq: Every day | ORAL | 3 refills | Status: DC

## 2022-01-20 NOTE — Progress Notes (Signed)
Established patient visit ? ? ?Patient: Caroline [name]   DOB: 1936/03/06   86 y.o. Female  MRN: [id number] ?Visit Date: 01/20/2022 ? ? ?Chief Complaint  ?Patient presents with  ? Follow-up  ? ?Subjective  ?  ?HPI  ?Follow up uti.  ?-restarted macrobid daily. Recheck u/a today.  ?-currently taking macrobid 100mg  daily. U/a today is clear of infection.  ?-patient's husband states that patient did develop diarrhea toward the end of the twice daily dosing.  ?-continues to have trouble with incontinence. Husband is wanting to start herbal supplement which is to help with incontinence. Uses "pee" wick at night to keep urine sway from skin. This is doing well.  ?-pain in both knees. Left knee is swollen and right knee hurts to straighten it . Uses a walker to help balance and gait. Family would like to have referral to orthopedics.  ?-blood pressure is some elevated today.  ? ? ? ? ?Medications: ?Outpatient Medications Prior to Visit  ?Medication Sig  ? acetaminophen (TYLENOL) 325 MG tablet Take 2 tablets (650 mg total) by mouth every 6 (six) hours as needed for mild pain, moderate pain, fever or headache.  ? Ascorbic Acid (VITAMIN C) 1000 MG tablet Take 1,000 mg by mouth daily.  ? atorvastatin (LIPITOR) 80 MG tablet Take 1 tablet (80 mg total) by mouth at bedtime.  ? Catheters MISC Use as directed. 1 box of inserts and 1 box canisters.  ? Cholecalciferol (VITAMIN D-3) 25 MCG (1000 UT) CAPS Take 1 capsule (1,000 Units total) by mouth daily.  ? Coenzyme Q10 (COQ10 PO) Take 1 tablet by mouth daily.  ? ELIQUIS 5 MG TABS tablet TAKE 1 TABLET BY MOUTH 2 TIMES DAILY.  ? furosemide (LASIX) 20 MG tablet Take 1 tablet (20 mg total) by mouth daily as needed for edema.  ? irbesartan (AVAPRO) 75 MG tablet Take 1 tablet po QAM and take 1/2 tablet po Qpm  ? levothyroxine (SYNTHROID) 125 MCG tablet TAKE 1 TABLET (125 MCG TOTAL) BY MOUTH DAILY AT 6 AM.  ? meclizine (ANTIVERT) 25 MG tablet Take 0.5 tablets (12.5 mg total) by mouth 3 (three)  times daily as needed for dizziness.  ? ondansetron (ZOFRAN) 4 MG tablet Take 1 tablet (4 mg total) by mouth every 6 (six) hours.  ? pantoprazole (PROTONIX) 40 MG tablet Take 1 tablet po QD  ? QUEtiapine (SEROQUEL) 25 MG tablet Take 25 mg by mouth at bedtime.  ? Vibegron (GEMTESA) 75 MG TABS Take 75 mg by mouth daily.  ? vitamin B-12 (CYANOCOBALAMIN) 250 MCG tablet Take 1 tablet (250 mcg total) by mouth daily.  ? VITAMIN D PO Take 250 mg by mouth.  ? [DISCONTINUED] nitrofurantoin, macrocrystal-monohydrate, (MACROBID) 100 MG capsule Take 1 capsule po BID for 7 days then take daily for prevention of UTI.  ? memantine (NAMENDA) 10 MG tablet Take 1 tablet (10 mg total) by mouth 2 (two) times daily. (Patient not taking: Reported on 01/05/2022)  ? [DISCONTINUED] memantine (NAMENDA TITRATION PAK) tablet pack 5 mg/day for =1 week; 5 mg twice daily for =1 week; 15 mg/day given in 5 mg and 10 mg separated doses for =1 week; then 10 mg twice daily (Patient not taking: Reported on 01/06/2022)  ? ?No facility-administered medications prior to visit.  ? ? ?Review of Systems  ?Constitutional:  Negative for activity change, appetite change, chills, fatigue and fever.  ?HENT:  Negative for congestion, postnasal drip, rhinorrhea, sinus pressure, sinus pain, sneezing and sore throat.   ?Eyes:  Negative.   ?Respiratory:  Negative for cough, chest tightness, shortness of breath and wheezing.   ?Cardiovascular:  Negative for chest pain and palpitations.  ?Gastrointestinal:  Negative for abdominal pain, constipation, diarrhea, nausea and vomiting.  ?Endocrine: Negative for cold intolerance, heat intolerance, polydipsia and polyuria.  ?Genitourinary:  Negative for dyspareunia, dysuria, flank pain, frequency and urgency.  ?     Chronic uti.   ?Musculoskeletal:  Negative for arthralgias, back pain and myalgias.  ?Skin:  Negative for rash.  ?Allergic/Immunologic: Negative for environmental allergies.  ?Neurological:  Negative for dizziness,  weakness and headaches.  ?     Improved cong  ?Hematological:  Negative for adenopathy.  ?Psychiatric/Behavioral:  The patient is not nervous/anxious.   ? ? ? ? Objective  ?  ? ?Today's Vitals  ? 01/20/22 1330  ?BP: (!) 153/66  ?Pulse: 86  ?Temp: 98 ?F (36.7 ?C)  ?SpO2: 98%  ?Weight: 142 lb (64.4 kg)  ?Height: 5' 4.17" (1.63 m)  ? ?Body mass index is 24.24 kg/m?.  ? ?BP Readings from Last 3 Encounters:  ?01/20/22 (!) 153/66  ?01/06/22 132/72  ?01/05/22 119/66  ?  ?Wt Readings from Last 3 Encounters:  ?01/20/22 142 lb (64.4 kg)  ?01/05/22 141 lb 12.8 oz (64.3 kg)  ?12/11/21 141 lb (64 kg)  ?  ?Physical Exam ?Vitals and nursing note reviewed.  ?Constitutional:   ?   Appearance: Normal appearance. She is well-developed.  ?HENT:  ?   Head: Normocephalic and atraumatic.  ?   Nose: Nose normal.  ?   Mouth/Throat:  ?   Mouth: Mucous membranes are moist.  ?   Pharynx: Oropharynx is clear.  ?Eyes:  ?   Extraocular Movements: Extraocular movements intact.  ?   Conjunctiva/sclera: Conjunctivae normal.  ?   Pupils: Pupils are equal, round, and reactive to light.  ?Cardiovascular:  ?   Rate and Rhythm: Normal rate and regular rhythm.  ?   Pulses: Normal pulses.  ?   Heart sounds: Normal heart sounds.  ?Pulmonary:  ?   Effort: Pulmonary effort is normal.  ?   Breath sounds: Normal breath sounds.  ?Abdominal:  ?   Palpations: Abdomen is soft.  ?Genitourinary: ?   Comments: Urine sample negative for evidence of infection or other abnormalities. ?Musculoskeletal:     ?   General: Normal range of motion.  ?   Cervical back: Normal range of motion and neck supple.  ?Lymphadenopathy:  ?   Cervical: No cervical adenopathy.  ?Skin: ?   General: Skin is warm and dry.  ?   Capillary Refill: Capillary refill takes less than 2 seconds.  ?Neurological:  ?   General: No focal deficit present.  ?   Mental Status: She is alert and oriented to person, place, and time. Mental status is at baseline.  ?Psychiatric:     ?   Mood and Affect: Mood  normal.     ?   Behavior: Behavior normal.     ?   Thought Content: Thought content normal.     ?   Judgment: Judgment normal.  ?  ? ?Results for orders placed or performed in visit on 01/20/22  ?POCT URINALYSIS DIP (CLINITEK)  ?Result Value Ref Range  ? Color, UA yellow yellow  ? Clarity, UA cloudy (A) clear  ? Glucose, UA negative negative mg/dL  ? Bilirubin, UA negative negative  ? Ketones, POC UA negative negative mg/dL  ? Spec Grav, UA >=1.030 (A) 1.010 - 1.025  ?  Blood, UA negative negative  ? pH, UA 5.5 5.0 - 8.0  ? POC PROTEIN,UA negative negative, trace  ? Urobilinogen, UA 0.2 0.2 or 1.0 E.U./dL  ? Nitrite, UA Negative Negative  ? Leukocytes, UA Negative Negative  ? ? Assessment & Plan  ?  ?1. Chronic urinary tract infection ?Urine sample negative for infection or other abnormalities.  Lower dose of Macrobid to 50 mg daily.  Recheck urine sample in 2 months.  Refer to neurologist as indicated. ?- POCT URINALYSIS DIP (CLINITEK) ?- nitrofurantoin (MACRODANTIN) 50 MG capsule; Take 1 capsule (50 mg total) by mouth daily.  Dispense: 30 capsule; Refill: 3 ? ?2. Chronic pain of both knees ?Refer to orthopedics for further evaluation and treatment. ?- Ambulatory referral to Orthopedic Surgery ? ?3. Essential hypertension ?Generally stable.  Continue blood pressure medications as prescribed. ? ? ?Problem List Items Addressed This Visit   ? ?  ? Cardiovascular and Mediastinum  ? Essential hypertension  ?  ? Genitourinary  ? Chronic urinary tract infection - Primary  ? Relevant Medications  ? nitrofurantoin (MACRODANTIN) 50 MG capsule  ? Other Relevant Orders  ? POCT URINALYSIS DIP (CLINITEK) (Completed)  ?  ? Other  ? Chronic pain of both knees  ? Relevant Orders  ? Ambulatory referral to Orthopedic Surgery  ?  ? ?Return in about 2 months (around 03/22/2022) for blood pressure.  ?   ? ? ? ? ?Carlean Jews, NP  ?[facility] ?[phone] (phone) ?[phone] (fax) ? ?[facility]  ?

## 2022-01-20 NOTE — Therapy (Signed)
?OUTPATIENT PHYSICAL THERAPY TREATMENT NOTE ? ? ?Patient Name: Caroline [name] ?MRN: [id number] ?DOB:04/02/1936, 86 y.o., female ?Today's Date: 01/20/2022 ? ?PCP: Carlean JewsBoscia, Heather E, NP ?REFERRING PROVIDER: Carlean JewsBoscia, Heather E, NP ? ?END OF SESSION:  ? PT End of Session - 01/20/22 1209   ? ? Visit Number 3   ? Number of Visits 17   plus eval  ? Date for PT Re-Evaluation 03/22/22   ? Authorization Type [employer]   ? Progress Note Due on Visit 10   ? PT Start Time 1400   ? PT Stop Time 1445   ? PT Time Calculation (min) 45 min   ? Equipment Utilized During Treatment Gait belt   ? Activity Tolerance Patient tolerated treatment well   ? [facility] for tasks assessed/performed   ? ?  ?  ? ?  ? ? ?Past Medical History:  ?Diagnosis Date  ? Anxiety   ? Arthritis   ? Celiac sprue   ? CVA (cerebral vascular accident) (HCC) 02/2018  ? R thalamic CVA  ? GERD (gastroesophageal reflux disease)   ? Glaucoma   ? Hormone replacement therapy (HRT)   ? Hyperlipidemia   ? Hypertension   ? Hypoglycemia   ? Hypothyroidism   ? OAB (overactive bladder)   ? Osteoporosis   ? PONV (postoperative nausea and vomiting)   ? Raynaud's disease   ? Scoliosis   ? Seasonal allergies   ? Senile purpura (HCC)   ? 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 Right Cheek  ? 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 Bridge Left Nose, and Left Neck  ? 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 Right Cheek Tx: curret x 3 and 5FU  ? 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 Bridge of nose tx Cx3 and 5FU, Left neck Tx Cx3 and 5FU  ? Thrombophlebitis   ? ?Past Surgical History:  ?Procedure Laterality Date  ? ABDOMINAL HYSTERECTOMY  1979  ? CATARACT EXTRACTION, BILATERAL    ? knee surgery Left   ? VARICOSE VEIN SURGERY Left   ? XI ROBOTIC ASSISTED PARAESOPHAGEAL HERNIA REPAIR N/A 06/15/2021  ? Procedure: XI ROBOTIC ASSISTED PARAESOPHAGEAL HERNIA REPAIR, GASTROPEXY, UPPER ENDOSCOPY;  Surgeon:  Berna Bueonnor, Chelsea A, MD;  Location: [facility];  Service: General;  Laterality: N/A;  ? ?Patient Active Problem List  ? Diagnosis Date Noted  ? Urinary incontinence 11/29/2021  ? Multiple pulmonary nodules 11/29/2021  ? Hypertrophic toenail 11/29/2021  ? Confusion 10/25/2021  ? Vitamin B12 deficiency 10/25/2021  ? DVT (deep venous thrombosis) (HCC) 07/30/2021  ? Dysphagia, post-stroke   ? Hypoalbuminemia due to protein-calorie malnutrition (HCC)   ? Benign essential HTN   ? Dyslipidemia   ? Delirium due to multiple etiologies 06/17/2021  ? S/P repair of paraesophageal hernia 06/15/2021  ? CVA (cerebral vascular accident) (HCC) 02/09/2018  ? Hypothyroidism 02/08/2018  ? Essential hypertension 02/08/2018  ? Acute ischemic stroke (HCC) 02/08/2018  ? Urinary tract infection without hematuria 02/08/2018  ? Acute cystitis without hematuria   ? Right thalamic stroke [facility](HCC)   ? Hyperlipidemia   ? ? 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 ?THERAPY DIAG:  ?Unsteadiness on feet ? ?Muscle weakness (generalized) ? ?Other abnormalities of gait and mobility ? ?BPPV (benign paroxysmal positional vertigo), left ? ?PERTINENT HISTORY: Left PCA distribution infarct with right homonymous hemianopsia right neglect and cognitive deficits; ?  ? ?  PRECAUTIONS: Falls ? ?SUBJECTIVE: Pt accompanied by her husband & daughter; pt reports she has dizziness when she lies down to go to bed at night; pt also reports she has big fear of falling as she has had several falls in the past ? ?PAIN:  ?Are you having pain? No ? ? ? ? ?TODAY'S TREATMENT:  ? ?GAIT: ?Gait pattern: decreased step length- Right, decreased step length- Left, decreased stride length, Right foot flat, Left foot flat, ataxic, trunk flexed, narrow BOS, poor foot clearance- Right, and poor foot clearance- Left ?Distance walked: 200' in clinic;  10' x 4 reps at end of session inside // bars without UE support ?Assistive device  utilized: Environmental consultant - 2 wheeled ?Level of assistance: CGA to Min A ?Comments: Pt demonstrates significant kyphosis and has difficulty maintaining safe distance to RW. Intermittent cues to stop with gait to step closer to RW. ? ? ?NeuroRe-ed: ?Pt performed standing lateral weight shifts with tactile cues 10 reps without UE support with CGA to min assist; then performed diagonal weight shifts in each forward stance - 10 reps each position with CGA to min assist ?Sit to stand 3 reps with bil. UE support from mat with CGA ? ?Tap ups to 8" step inside // bars with bil. UE support 5 reps each leg, then 5 reps alternating each leg with UE support on // bars ? ?Tap ups to 4" step 5 reps each leg with CGA without UE support; then 5 reps alternating LE's with CGA to min assist due to fear of falling ? ?(+) Lt Dix-Hallpike test with Lt rotary upbeating nystagmus - indicative of Lt BPPV ? ? ?Canalith Repositioning: ?Epley maneuver 2 reps for Lt BPPV - improvement noted on 2nd rep with no nystagmus and no c/o vertigo on 2nd rep ? ? ? ? ? ?HEP (previously established) ?Access Code: [email or web address] ?URL: [email or web address] ?Date: 01/06/2022 ?Prepared by: Jethro Bastos ? ?Exercises ?- Sit to Stand with Armchair  - 1 x daily - 5 x weekly - 2 sets - 5 reps ?- Clamshell  - 1 x daily - 5 x weekly - 1 sets - 10 reps ?  ?  ?PATIENT EDUCATION: ?Education details: no changes made to HEP on 01-19-22 ?Person educated: Patient and Spouse ?Education method: Explanation and Demonstration ?Education comprehension: verbalized understanding, returned demonstration, verbal cues required, and needs further education ?  ?  ?  ?  ?GOALS: ?Goals reviewed with patient? Yes ?  ?SHORT TERM GOALS: Target date: 01/25/2022 ?  ?Pt will perform initial HEP w/min A from husband for improved strength, balance, transfers and gait. ?  ?Baseline:  ?Goal status: INITIAL ?  ?2.  Pt will improve Berg Balance to >/= 30/56 to demonstrate improved standing balance  and reduced fall risk ?Baseline: 26/56 ?Goal status: INITIAL ?  ?3.  Update goal when 5x STS complete and write LTG as appropriate  ?Baseline:  ?Goal status: INITIAL ?  ?4.  Pt and caregiver will report 4 or fewer falls since eval for reduced fall frequency and improved safety awareness at home ?Baseline:  ?Goal status: INITIAL ?  ?5.  Pt will trial use of posterior walker for improved balance and safety with functional mobility  ?Baseline: currently using RW ?Goal status: INITIAL ?  ?  ?LONG TERM GOALS: Target date: 02/22/2022 ?  ?Pt will perform final HEP w/min A from husband for improved strength, balance, transfers and gait. ?  ?Baseline:  ?Goal status: INITIAL ?  ?2.  Pt will improve gait  velocity to at least 1.9 ft/s with LRAD for improved gait efficiency and performance at community ambulator level without recurrent fall risk ?  ?Baseline: 1.46 ft/s with RW ?Goal status: INITIAL ?  ?3.  Pt will improve Berg Balance to >/= 35/56 to demonstrate improved standing balance and reduced fall risk ?Baseline: 26/56 ?Goal status: INITIAL ?  ?4.  Update goal when 5x STS performed  ?Baseline:  ?Goal status: INITIAL ?  ?5.  Pt and caregiver will teach back techniques for fall prevention in the home and proper fall recovery for improved safety  ?Baseline:  ?Goal status: INITIAL ?  ?5.  Pt will improve FOTO to 62% or greater for improved functional mobility  ?Baseline: 57% ?Goal status: INITIAL ?  ?  ?ASSESSMENT: ?  ?CLINICAL IMPRESSION: ?Pt had (+) Lt Dix-Hallpike test with Lt rotary upbeating nystagmus, indicative of Lt BPPV posterior canalithiasis.  Epley maneuver was performed for 2 reps with no nystagmus and no vertigo reported on 2nd rep, indicative of resolution of Lt BPPV.  Pt needs cues to stay close inside RW as she pushes RW far ahead and leans forward, increasing forward posture with trunk flexion.  Pt is unable to stand upright due to scoliosis.  Pt was able to amb. 10' x 4 reps inside // bars without UE support  with CGA without LOB, but pt repeatedly states she has a fear of falling.  Cont with POC.  ? ?  ?OBJECTIVE IMPAIRMENTS Abnormal gait, decreased activity tolerance, decreased balance, decreased cognition,

## 2022-01-21 ENCOUNTER — Ambulatory Visit: Payer: Medicare Other | Admitting: Physical Therapy

## 2022-01-21 ENCOUNTER — Ambulatory Visit: Payer: Medicare Other | Admitting: Speech Pathology

## 2022-01-21 ENCOUNTER — Encounter: Payer: Self-pay | Admitting: Physical Therapy

## 2022-01-21 DIAGNOSIS — R2681 Unsteadiness on feet: Secondary | ICD-10-CM | POA: Diagnosis not present

## 2022-01-21 DIAGNOSIS — R2689 Other abnormalities of gait and mobility: Secondary | ICD-10-CM

## 2022-01-21 DIAGNOSIS — R41841 Cognitive communication deficit: Secondary | ICD-10-CM

## 2022-01-21 DIAGNOSIS — M6281 Muscle weakness (generalized): Secondary | ICD-10-CM

## 2022-01-21 NOTE — Therapy (Signed)
?OUTPATIENT SPEECH LANGUAGE PATHOLOGY TREATMENT NOTE ? ? ?Patient Name: Caroline [name] ?MRN: [id number] ?DOB:06-05-1936, 86 y.o., female ?Today's Date: 01/21/2022 ? ?PCP: Ronnell Freshwater, NP ?REFERRING PROVIDER: Charlett Blake, MD  ? ?END OF SESSION:  ? End of Session - 01/21/22 1103   ? ? Visit Number 4   ? Number of Visits 25   ? Date for SLP Re-Evaluation 03/22/22   ? SLP Start Time 1107   ? SLP Stop Time  1145   ? SLP Time Calculation (min) 38 min   ? Activity Tolerance Patient tolerated treatment well   ? ?  ?  ? ?  ? ? ? ?Past Medical History:  ?Diagnosis Date  ? Anxiety   ? Arthritis   ? Celiac sprue   ? CVA (cerebral vascular accident) ([place]) 02/2018  ? R thalamic CVA  ? GERD (gastroesophageal reflux disease)   ? Glaucoma   ? Hormone replacement therapy (HRT)   ? Hyperlipidemia   ? Hypertension   ? Hypoglycemia   ? Hypothyroidism   ? OAB (overactive bladder)   ? Osteoporosis   ? PONV (postoperative nausea and vomiting)   ? Raynaud's disease   ? Scoliosis   ? Seasonal allergies   ? Senile purpura ([place])   ? 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 Right Cheek  ? 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 Bridge Left Nose, and Left Neck  ? 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 Right Cheek Tx: curret x 3 and 5FU  ? 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 Bridge of nose tx Cx3 and 5FU, Left neck Tx Cx3 and 5FU  ? Thrombophlebitis   ? ?Past Surgical History:  ?Procedure Laterality Date  ? ABDOMINAL HYSTERECTOMY  1979  ? CATARACT EXTRACTION, BILATERAL    ? knee surgery Left   ? VARICOSE VEIN SURGERY Left   ? XI ROBOTIC ASSISTED PARAESOPHAGEAL HERNIA REPAIR N/A 06/15/2021  ? Procedure: XI ROBOTIC ASSISTED PARAESOPHAGEAL HERNIA REPAIR, GASTROPEXY, UPPER ENDOSCOPY;  Surgeon: Clovis Riley, MD;  Location: [facility];  Service: General;  Laterality: N/A;  ? ?Patient Active Problem List  ? Diagnosis Date Noted  ? Urinary incontinence 11/29/2021  ? Multiple pulmonary  nodules 11/29/2021  ? Hypertrophic toenail 11/29/2021  ? Confusion 10/25/2021  ? Vitamin B12 deficiency 10/25/2021  ? DVT (deep venous thrombosis) ([place]) 07/30/2021  ? Dysphagia, post-stroke   ? Hypoalbuminemia due to protein-calorie malnutrition ([place])   ? Benign essential HTN   ? Dyslipidemia   ? Delirium due to multiple etiologies 06/17/2021  ? S/P repair of paraesophageal hernia 06/15/2021  ? CVA (cerebral vascular accident) ([place]) 02/09/2018  ? Hypothyroidism 02/08/2018  ? Essential hypertension 02/08/2018  ? Acute ischemic stroke ([place]) 02/08/2018  ? Urinary tract infection without hematuria 02/08/2018  ? Acute cystitis without hematuria   ? Right thalamic stroke [facility])   ? Hyperlipidemia   ? ? ?ONSET DATE: 06/15/2021 ? 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 ?THERAPY DIAG:  ?Cognitive communication deficit ? ?SUBJECTIVE: "I'm worn out"  ? ?PAIN:  ?Are you having pain? No ? ? ?OBJECTIVE:  ? ?TODAY'S TREATMENT: ?SKILLED INTERVENTIONS: ?01-21-2022: Pt attends with spouse and daughter. Brings photos and memory book vehicle with. Discussion on best way to format memory book, rec using right side of book to encourage R visual scanning. Reviewed photos with pt, able to name 75%  of people pictures. Able to recall additional with mod-max-A from family or ST. Phonemic cues intermittently successful. Advised family on useful cueing strategies that appear beneficial for pt, additionally educate on cueing hierarchy. Usual min-A to scan R. Provide information on adult day programs in area per family request.  ? ?01-19-2022: Pt attends session with spouse and daughter. ST provides education and feedback on memory book design and strategies for usage at home. Daughter indicates she has begun collecting photos, they need to put together. ST recommends keeping pictures per page limited and adding simple and direct captions to assist with pt recall. Discussion on using  memory book to facilitate conversations, orient pt to place and time, and remind pt of family members and other personally relevant topics. Education and coaching on facilitating right field scanning to compensate for right neglect. Spouse reports increased occurences of pt being able to find chair controller which is on right side of chair. Difficulty with knowing chair vs. TV remote. Generate strategy of marking each with appropriate picture to provide visual aid to assist in recognizing tool for ease of use.  ? ?01-06-22: Lead pt and spouse through activity in which pt recalls details about events, people, and places which are personally relevant and important to the patient. Education on purpose and use of a memory/orientation book to assist w/ patient's memory impairment. Began generating a list of topics to include in book. Husband demonstrates appropriate cueing (semantic cues and questioning cues primarily) to encourage pt to recall important information. Lubertha Basque tells Stanley he will try and gather photos to begin constructing memory book next session.  ? ?PATIENT EDUCATION: ?Education details: see above ?Person educated: Patient and Spouse ?Education method: Explanation and Verbal cues ?Education comprehension: verbal cues required and needs further education ?  ?  ?GOALS: ?Goals reviewed with patient? Yes ?  ?SHORT TERM GOALS: Target date: 01/25/2022 ?  ?Caregivers will use 2 external aids for pt to find chair controls on the right side of her chair with 50% less cueing from spouse, subjectively ?Baseline: 01/19/22, 01/21/2022 ?Goal status: GOAL PARTIALLY MET ?  ?2.  Caregivers will carryover 2 external aids to reduce pt from getting up with out asking for help 50% less subjectively by spouse ?Goal status: ONGOING ?  ?3.  Pt and Caregivers will use memory book for pt to recall 8 family names with usual min A  ? Baseline: 01/19/22, 01/21/2022 ?Goal status: GOAL PARTIALLY MET ?  ?  ?  ?  ?LONG TERM GOALS: Target date:  03/22/2022 ?  ?Caregivers will use external aids for memory in 2 additional situations where safety for frustration are a concern ? Goal status: ONGOING ?  ?2.  Caregivers and pt will use memory book to recall 2 pertinent facts about 5 family members with usual min A ?Goal status: ONGOING ?  ?3.  Caregivers/pt will use external memory aids or memory book to recall personally relevant places, names, trips etc over 4 sessions ?Goal status: ONGOING ?  ?4.  Caregivers/pt will use external aids to recall that she is home, with 50% reduction in confusion /frustration about this reported subjectively by spouse ?Goal status: ONGOING ?  ?5.  Pt will attend to the right side of her memory book with external aids and verbal cues to locate information 3/5x with occasional min A ? Goal status: ONGOING ?  ?ASSESSMENT: ?  ?CLINICAL IMPRESSION: ?Alberto Schoch  presents with impaired memory, orientation, attention, executive functioning. She had MCI dx since 2019. Significant  cognitive impairment, impacting pt's ability to recall personally relevant information. Pt is currently requiring full time care. Lubertha Basque is managing all finances, cooking, shopping and meds. He has care attendants a couple days a week who help with cleaning and bathing. Laaibah verbalizes poor awareness of her cognitive impairment, refernces [name] to fill in information many times throghout session. Right visual field cut affects her ability to attend to the right side. [name] reports she has difficulty recalling/attending to the controls on the right bottom of her chair that help her get up and he repeatedly cues her for this. He also reports she gets up with out his knowing and he finds this a safety issue. I recommend skilled ST to maximize cognition, train caregivers in compensatory strategies to support memory in daily activities and use of external memory aids for safety and to reduce caregiver burden. ? ?OBJECTIVE IMPAIRMENTS include attention, memory,  awareness, and executive functioning. These impairments are limiting patient from  making safe decisions and recalling pertinent information such as family names . ?Factors affecting potential to achieve g

## 2022-01-21 NOTE — Therapy (Signed)
?OUTPATIENT PHYSICAL THERAPY TREATMENT NOTE ? ? ?Patient Name: Caroline [name] ?MRN: [id number] ?DOB:11/05/1935, 86 y.o., female ?Today's Date: 01/22/2022 ? ?PCP: Ronnell Freshwater, NP ?REFERRING PROVIDER: Ronnell Freshwater, NP ? ?END OF SESSION:  ? PT End of Session - 01/21/22 1405   ? ? Visit Number 4   ? Number of Visits 17   plus eval  ? Date for PT Re-Evaluation 03/22/22   ? Authorization Type [employer]   ? Progress Note Due on Visit 10   ? PT Start Time 1017   ? PT Stop Time 1100   ? PT Time Calculation (min) 43 min   ? Equipment Utilized During Treatment Gait belt   ? Activity Tolerance Patient tolerated treatment well   ? Behavior During Therapy [facility] for tasks assessed/performed   ? ?  ?  ? ?  ? ? ?Past Medical History:  ?Diagnosis Date  ? Anxiety   ? Arthritis   ? Celiac sprue   ? CVA (cerebral vascular accident) ([place]) 02/2018  ? R thalamic CVA  ? GERD (gastroesophageal reflux disease)   ? Glaucoma   ? Hormone replacement therapy (HRT)   ? Hyperlipidemia   ? Hypertension   ? Hypoglycemia   ? Hypothyroidism   ? OAB (overactive bladder)   ? Osteoporosis   ? PONV (postoperative nausea and vomiting)   ? Raynaud's disease   ? Scoliosis   ? Seasonal allergies   ? Senile purpura ([place])   ? 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 Right Cheek  ? 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 Bridge Left Nose, and Left Neck  ? 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 Right Cheek Tx: curret x 3 and 5FU  ? 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 Bridge of nose tx Cx3 and 5FU, Left neck Tx Cx3 and 5FU  ? Thrombophlebitis   ? ?Past Surgical History:  ?Procedure Laterality Date  ? ABDOMINAL HYSTERECTOMY  1979  ? CATARACT EXTRACTION, BILATERAL    ? knee surgery Left   ? VARICOSE VEIN SURGERY Left   ? XI ROBOTIC ASSISTED PARAESOPHAGEAL HERNIA REPAIR N/A 06/15/2021  ? Procedure: XI ROBOTIC ASSISTED PARAESOPHAGEAL HERNIA REPAIR, GASTROPEXY, UPPER ENDOSCOPY;  Surgeon:  Clovis Riley, MD;  Location: [facility];  Service: General;  Laterality: N/A;  ? ?Patient Active Problem List  ? Diagnosis Date Noted  ? Urinary incontinence 11/29/2021  ? Multiple pulmonary nodules 11/29/2021  ? Hypertrophic toenail 11/29/2021  ? Confusion 10/25/2021  ? Vitamin B12 deficiency 10/25/2021  ? DVT (deep venous thrombosis) ([place]) 07/30/2021  ? Dysphagia, post-stroke   ? Hypoalbuminemia due to protein-calorie malnutrition ([place])   ? Benign essential HTN   ? Dyslipidemia   ? Delirium due to multiple etiologies 06/17/2021  ? S/P repair of paraesophageal hernia 06/15/2021  ? CVA (cerebral vascular accident) ([place]) 02/09/2018  ? Hypothyroidism 02/08/2018  ? Essential hypertension 02/08/2018  ? Acute ischemic stroke ([place]) 02/08/2018  ? Urinary tract infection without hematuria 02/08/2018  ? Acute cystitis without hematuria   ? Right thalamic stroke [facility])   ? Hyperlipidemia   ? ? 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 ?THERAPY DIAG:  ?Unsteadiness on feet ? ?Muscle weakness (generalized) ? ?Other abnormalities of gait and mobility ? ?PERTINENT HISTORY: Left PCA distribution infarct with right homonymous hemianopsia right neglect and cognitive deficits; ?  ? ?PRECAUTIONS: Falls ? ?SUBJECTIVE:  Pt's daughter states pt did not c/o dizziness when she lied down to get in bed last night;  pt reports "it was so nice to be able to lie down without having the dizziness" last night ? ?PAIN:  ?Are you having pain? No ? ? ? ? ?TODAY'S TREATMENT:  ? ?GAIT: ?Gait pattern: decreased step length- Right, decreased step length- Left, decreased stride length, Right foot flat, Left foot flat, ataxic, trunk flexed, narrow BOS, poor foot clearance- Right, and poor foot clearance- Left ?Distance walked: 115' x 2 reps with CGA only - no RW used;  115' x 1 later in session;  pt used RW to amb. From lobby to gym and then from gym to Speech therapy at end of  session ?Assistive device utilized: None ?Level of assistance: CGA to Min A ?Comments: Pt demonstrates significant kyphosis - has tendency to push RW too far ahead and amb. With trunk flexed;  (RW was lowered one notch to lowest setting to fascilitate pt staying more inside RW during gait)  ? ? ?NeuroRe-ed: ? ?Lt Dix-Hallpike test (-) with no nystagmus and no c/o vertigo - indicative of resolution of  Lt BPPV posterior canalithiasis ? ?Standing balance activity to facilitate balance with turning and with reaching outside BOS;  pt reached for 5 cones from computer tray table to mat (Rt to Lt side) and then from Lt side to Rt side to place cones back on table; 2nd rep - pt reached for cone and then turned feet to place cone on mat table behind her and then back to computer table with CGA to min assist and with verbal cues due to visual deficit ? ?Tap ups to 4"  step inside // bars with bil. UE support 5 reps each leg, then 5 reps alternating each leg with UE support on // bars ? ?Tap ups to 4" step 5 reps each leg with CGA without UE support; then 5 reps alternating LE's with CGA to min assist due to fear of falling ? ?Sidestepping 10' x 2 reps inside // bars with minimal UE support with CGA due to pt's increased fear of falling ? ?TherEx: ? ?Sit to stand transfers from mat 5 reps without UE support but with pt using back of legs intermittently to assist with stabilization ?SciFit level 3.0 x 5" with UE 's and LE's ? ? ?HEP (previously established) ?Access Code: [email or web address] ?URL: [email or web address] ?Date: 01/06/2022 ?Prepared by: Baldomero Lamy ? ?Exercises ?- Sit to Stand with Armchair  - 1 x daily - 5 x weekly - 2 sets - 5 reps ?- Clamshell  - 1 x daily - 5 x weekly - 1 sets - 10 reps ?  ?  ?PATIENT EDUCATION: ?Education details: no changes made to HEP on 01-21-22; RW lowered 1 notch and new tennis balls placed on RW for easier propulsion ?Person educated: Patient and daughter ?Education method:  Explanation and Demonstration ?Education comprehension: verbalized understanding, returned demonstration, verbal cues required, and needs further education ?  ?  ?  ?  ?GOALS: ?Goals reviewed with patient? Yes ?  ?SHORT TERM GOALS: Target date: 01/25/2022 ?  ?Pt will perform initial HEP w/min A from husband for improved strength, balance, transfers and gait. ?  ?Baseline:  ?Goal status: INITIAL ?  ?2.  Pt will improve Berg Balance to >/= 30/56 to demonstrate improved standing balance and reduced fall risk ?Baseline: 26/56 ?Goal status: INITIAL ?  ?3.  Update goal when 5x STS complete and write LTG as appropriate  ?  Baseline:  ?Goal status: INITIAL ?  ?4.  Pt and caregiver will report 4 or fewer falls since eval for reduced fall frequency and improved safety awareness at home ?Baseline:  ?Goal status: INITIAL ?  ?5.  Pt will trial use of posterior walker for improved balance and safety with functional mobility  ?Baseline: currently using RW ?Goal status: INITIAL ?  ?  ?LONG TERM GOALS: Target date: 02/22/2022 ?  ?Pt will perform final HEP w/min A from husband for improved strength, balance, transfers and gait. ?  ?Baseline:  ?Goal status: INITIAL ?  ?2.  Pt will improve gait velocity to at least 1.9 ft/s with LRAD for improved gait efficiency and performance at community ambulator level without recurrent fall risk ?  ?Baseline: 1.46 ft/s with RW ?Goal status: INITIAL ?  ?3.  Pt will improve Berg Balance to >/= 35/56 to demonstrate improved standing balance and reduced fall risk ?Baseline: 26/56 ?Goal status: INITIAL ?  ?4.  Update goal when 5x STS performed  ?Baseline:  ?Goal status: INITIAL ?  ?5.  Pt and caregiver will teach back techniques for fall prevention in the home and proper fall recovery for improved safety  ?Baseline:  ?Goal status: INITIAL ?  ?5.  Pt will improve FOTO to 62% or greater for improved functional mobility  ?Baseline: 57% ?Goal status: INITIAL ?  ?  ?ASSESSMENT: ?  ?CLINICAL IMPRESSION: ?Pt had  (-) Lt Dix-Hallpike test with no c/o vertigo and no nystagmus noted, indicative of resolution of Lt BPPV.  Pt increased amb. Distance with less assistance given in today's session compared to previous session, with pt amb. 23

## 2022-01-24 DIAGNOSIS — G8929 Other chronic pain: Secondary | ICD-10-CM | POA: Insufficient documentation

## 2022-01-26 ENCOUNTER — Ambulatory Visit: Payer: Medicare Other | Admitting: Occupational Therapy

## 2022-01-26 ENCOUNTER — Ambulatory Visit: Payer: Medicare Other | Admitting: Speech Pathology

## 2022-01-26 ENCOUNTER — Ambulatory Visit: Payer: Medicare Other

## 2022-01-26 DIAGNOSIS — M6281 Muscle weakness (generalized): Secondary | ICD-10-CM

## 2022-01-26 DIAGNOSIS — R2681 Unsteadiness on feet: Secondary | ICD-10-CM

## 2022-01-26 DIAGNOSIS — R41841 Cognitive communication deficit: Secondary | ICD-10-CM

## 2022-01-26 DIAGNOSIS — R2689 Other abnormalities of gait and mobility: Secondary | ICD-10-CM

## 2022-01-26 NOTE — Therapy (Signed)
?OUTPATIENT PHYSICAL THERAPY TREATMENT NOTE ? ? ?Patient Name: Caroline [name] ?MRN: [id number] ?DOB:1936/03/04, 86 y.o., female ?Today's Date: 01/26/2022 ? ?PCP: Carlean Jews, NP ?REFERRING PROVIDER: Carlean Jews, NP ? ?END OF SESSION:  ? PT End of Session - 01/26/22 1401   ? ? Visit Number 5   ? Number of Visits 17   plus eval  ? Date for PT Re-Evaluation 03/22/22   ? Authorization Type [employer]   ? Progress Note Due on Visit 10   ? PT Start Time 1403   ? PT Stop Time 1445   ? PT Time Calculation (min) 42 min   ? Equipment Utilized During Treatment Gait belt   ? Activity Tolerance Patient tolerated treatment well   ? Behavior During Therapy [facility] for tasks assessed/performed   ? ?  ?  ? ?  ? ? ?Past Medical History:  ?Diagnosis Date  ? Anxiety   ? Arthritis   ? Celiac sprue   ? CVA (cerebral vascular accident) (HCC) 02/2018  ? R thalamic CVA  ? GERD (gastroesophageal reflux disease)   ? Glaucoma   ? Hormone replacement therapy (HRT)   ? Hyperlipidemia   ? Hypertension   ? Hypoglycemia   ? Hypothyroidism   ? OAB (overactive bladder)   ? Osteoporosis   ? PONV (postoperative nausea and vomiting)   ? Raynaud's disease   ? Scoliosis   ? Seasonal allergies   ? Senile purpura (HCC)   ? 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 Right Cheek  ? 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 Bridge Left Nose, and Left Neck  ? 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 Right Cheek Tx: curret x 3 and 5FU  ? 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 Bridge of nose tx Cx3 and 5FU, Left neck Tx Cx3 and 5FU  ? Thrombophlebitis   ? ?Past Surgical History:  ?Procedure Laterality Date  ? ABDOMINAL HYSTERECTOMY  1979  ? CATARACT EXTRACTION, BILATERAL    ? knee surgery Left   ? VARICOSE VEIN SURGERY Left   ? XI ROBOTIC ASSISTED PARAESOPHAGEAL HERNIA REPAIR N/A 06/15/2021  ? Procedure: XI ROBOTIC ASSISTED PARAESOPHAGEAL HERNIA REPAIR, GASTROPEXY, UPPER ENDOSCOPY;  Surgeon:  Berna Bue, MD;  Location: [facility];  Service: General;  Laterality: N/A;  ? ?Patient Active Problem List  ? Diagnosis Date Noted  ? Chronic pain of both knees 01/24/2022  ? Urinary incontinence 11/29/2021  ? Multiple pulmonary nodules 11/29/2021  ? Hypertrophic toenail 11/29/2021  ? Confusion 10/25/2021  ? Vitamin B12 deficiency 10/25/2021  ? DVT (deep venous thrombosis) (HCC) 07/30/2021  ? Dysphagia, post-stroke   ? Hypoalbuminemia due to protein-calorie malnutrition (HCC)   ? Benign essential HTN   ? Dyslipidemia   ? Delirium due to multiple etiologies 06/17/2021  ? S/P repair of paraesophageal hernia 06/15/2021  ? CVA (cerebral vascular accident) (HCC) 02/09/2018  ? Hypothyroidism 02/08/2018  ? Essential hypertension 02/08/2018  ? Acute ischemic stroke (HCC) 02/08/2018  ? Chronic urinary tract infection 02/08/2018  ? Acute cystitis without hematuria   ? Right thalamic stroke [facility])   ? Hyperlipidemia   ? ? 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 ?THERAPY DIAG:  ?Unsteadiness on feet ? ?Muscle weakness (generalized) ? ?Other abnormalities of gait and mobility ? ?PERTINENT HISTORY: Left PCA distribution infarct with right homonymous hemianopsia right neglect and cognitive deficits; ?  ? ?  PRECAUTIONS: Falls ? ?SUBJECTIVE:  Denies any dizziness, has been doing very well. No falls per patient and husband reports. Reports feeling fatigued today. No other new changes.  ? ?PAIN:  ?Are you having pain? No ? ? ? ?TODAY'S TREATMENT:  ? ?GAIT: ?Gait pattern: decreased step length- Right, decreased step length- Left, decreased stride length, Right foot flat, Left foot flat, ataxic, trunk flexed, narrow BOS, poor foot clearance- Right, and poor foot clearance- Left ?Distance walked: 230' x 1 with HHA  ?Assistive device utilized: None and RW ?Level of assistance: CGA to HHA ?Comments: Continues to use RW to ambulate into/out of session, with increased  tendency to push RW too far ahead with ambulation. Completed additional gait with single HHA on R side x 230' with cues for step length and upright posture. Cues to tuck hips to promote upright posture.  ? ?NeuroRe-ed: ?5x sit <> stand test: 23.34 secs with UE support but heavy pushing with BLE into mat; trialed second time working toward reduced support and reliance of pusing BLE into mat completed in 32.38 seconds ? ? [facility] PT Assessment - 01/26/22 0001   ? ?  ? Standardized Balance Assessment  ? Standardized Balance Assessment Berg Balance Test   ?  ? Berg Balance Test  ? Sit to Stand Able to stand using hands after several tries   ? Standing Unsupported Able to stand 2 minutes with supervision   ? Sitting with Back Unsupported but Feet Supported on Floor or Stool Able to sit safely and securely 2 minutes   ? Stand to Sit Controls descent by using hands   ? Transfers Able to transfer with verbal cueing and /or supervision   ? Standing Unsupported with Eyes Closed Able to stand 10 seconds with supervision   ? Standing Unsupported with Feet Together Able to place feet together independently and stand for 1 minute with supervision   ? From Standing, Reach Forward with Outstretched Arm Can reach forward >5 cm safely (2")   ? From Standing Position, Pick up Object from Floor Able to pick up shoe, needs supervision   ? From Standing Position, Turn to Look Behind Over each Shoulder Looks behind one side only/other side shows less weight shift   ? Turn 360 Degrees Needs close supervision or verbal cueing   ? Standing Unsupported, Alternately Place Feet on Step/Stool Able to complete 4 steps without aid or supervision   ? Standing Unsupported, One Foot in Front Able to take small step independently and hold 30 seconds   ? Standing on One Leg Tries to lift leg/unable to hold 3 seconds but remains standing independently   ? Total Score 34   ? Sharlene Motts comment: 34/56   ? ?  ?  ? ?  ? ? [facility] PT Assessment - 01/26/22 0001   ? ?  ?  Standardized Balance Assessment  ? Standardized Balance Assessment Berg Balance Test   ?  ? Berg Balance Test  ? Sit to Stand Able to stand using hands after several tries   ? Standing Unsupported Able to stand 2 minutes with supervision   ? Sitting with Back Unsupported but Feet Supported on Floor or Stool Able to sit safely and securely 2 minutes   ? Stand to Sit Controls descent by using hands   ? Transfers Able to transfer with verbal cueing and /or supervision   ? Standing Unsupported with Eyes Closed Able to stand 10 seconds with supervision   ? Standing Unsupported with  Feet Together Able to place feet together independently and stand for 1 minute with supervision   ? From Standing, Reach Forward with Outstretched Arm Can reach forward >5 cm safely (2")   ? From Standing Position, Pick up Object from Floor Able to pick up shoe, needs supervision   ? From Standing Position, Turn to Look Behind Over each Shoulder Looks behind one side only/other side shows less weight shift   ? Turn 360 Degrees Needs close supervision or verbal cueing   ? Standing Unsupported, Alternately Place Feet on Step/Stool Able to complete 4 steps without aid or supervision   ? Standing Unsupported, One Foot in Front Able to take small step independently and hold 30 seconds   ? Standing on One Leg Tries to lift leg/unable to hold 3 seconds but remains standing independently   ? Total Score 34   ? Sharlene MottsBerg comment: 34/56   ? ?  ?  ? ?  ? ?Reviewed HEP and added Bolded additions:  ?Access Code: [email or web address] ?URL: [email or web address] ?Date: 01/26/2022 ?Prepared by: Jethro BastosKaitlyn Maudell Stanbrough ? ?Exercises ?- Sit to Stand with Armchair  - 1 x daily - 5 x weekly - 2 sets - 5 reps ?- Clamshell  - 1 x daily - 5 x weekly - 1 sets - 10 reps ?- Standing Toe Taps at Counter  - 1 x daily - 5 x weekly - 1 sets - 10 reps - educated on completing at countertop with UE support and supervision from family member  ?  ?  ?PATIENT EDUCATION: ?Education details:  Progress toward STGs ?Person educated: Patient and daughter ?Education method: Explanation and Demonstration ?Education comprehension: verbalized understanding, returned demonstration, verbal cues required, and

## 2022-01-26 NOTE — Therapy (Signed)
?OUTPATIENT SPEECH LANGUAGE PATHOLOGY TREATMENT NOTE ? ? ?Patient Name: Caroline [name] ?MRN: [id number] ?DOB:1935/10/22, 86 y.o., female ?Today's Date: 01/26/2022 ? ?PCP: Ronnell Freshwater, NP ?REFERRING PROVIDER: Charlett Blake, MD  ? ?END OF SESSION:  ? End of Session - 01/26/22 1331   ? ? Visit Number 5   ? Number of Visits 25   ? Date for SLP Re-Evaluation 03/22/22   ? SLP Start Time 1319   ? SLP Stop Time  1400   ? SLP Time Calculation (min) 41 min   ? Activity Tolerance Patient tolerated treatment well   ? ?  ?  ? ?  ? ? ? ?Past Medical History:  ?Diagnosis Date  ? Anxiety   ? Arthritis   ? Celiac sprue   ? CVA (cerebral vascular accident) ([place]) 02/2018  ? R thalamic CVA  ? GERD (gastroesophageal reflux disease)   ? Glaucoma   ? Hormone replacement therapy (HRT)   ? Hyperlipidemia   ? Hypertension   ? Hypoglycemia   ? Hypothyroidism   ? OAB (overactive bladder)   ? Osteoporosis   ? PONV (postoperative nausea and vomiting)   ? Raynaud's disease   ? Scoliosis   ? Seasonal allergies   ? Senile purpura ([place])   ? 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 Right Cheek  ? 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 Bridge Left Nose, and Left Neck  ? 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 Right Cheek Tx: curret x 3 and 5FU  ? 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 Bridge of nose tx Cx3 and 5FU, Left neck Tx Cx3 and 5FU  ? Thrombophlebitis   ? ?Past Surgical History:  ?Procedure Laterality Date  ? ABDOMINAL HYSTERECTOMY  1979  ? CATARACT EXTRACTION, BILATERAL    ? knee surgery Left   ? VARICOSE VEIN SURGERY Left   ? XI ROBOTIC ASSISTED PARAESOPHAGEAL HERNIA REPAIR N/A 06/15/2021  ? Procedure: XI ROBOTIC ASSISTED PARAESOPHAGEAL HERNIA REPAIR, GASTROPEXY, UPPER ENDOSCOPY;  Surgeon: Clovis Riley, MD;  Location: [facility];  Service: General;  Laterality: N/A;  ? ?Patient Active Problem List  ? Diagnosis Date Noted  ? Chronic pain of both knees 01/24/2022  ? Urinary  incontinence 11/29/2021  ? Multiple pulmonary nodules 11/29/2021  ? Hypertrophic toenail 11/29/2021  ? Confusion 10/25/2021  ? Vitamin B12 deficiency 10/25/2021  ? DVT (deep venous thrombosis) ([place]) 07/30/2021  ? Dysphagia, post-stroke   ? Hypoalbuminemia due to protein-calorie malnutrition ([place])   ? Benign essential HTN   ? Dyslipidemia   ? Delirium due to multiple etiologies 06/17/2021  ? S/P repair of paraesophageal hernia 06/15/2021  ? CVA (cerebral vascular accident) ([place]) 02/09/2018  ? Hypothyroidism 02/08/2018  ? Essential hypertension 02/08/2018  ? Acute ischemic stroke ([place]) 02/08/2018  ? Chronic urinary tract infection 02/08/2018  ? Acute cystitis without hematuria   ? Right thalamic stroke [facility])   ? Hyperlipidemia   ? ? ?ONSET DATE: 06/15/2021 ? 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 ?THERAPY DIAG:  ?Cognitive communication deficit ? ?SUBJECTIVE: "It's been busy"  ? ?PAIN:  ?Are you having pain? No ? ? ?OBJECTIVE:  ? ?TODAY'S TREATMENT: ?SKILLED INTERVENTIONS: ?01-26-2022: Target orientation to present through guided discussion regarding daughters recent visit. Able to relay x3 pertinent, accurate details with min-A. Generated modification to be used for chair controller and TV remote d/t pt  and spouse reporting difficulty with usage and telling them apart. Education provided on implementing tool to be able to use for calling for assistance if Lubertha Basque is outside. Discussion on modifying potential tools for ease of use. Spouse to buy and bring in for modification at future session. Engage in cognitive exercise with use of R visual scanning. Able to generate targets 11/15 trials, mod-I. Usual min-A to fully scan R. ? ?01-21-2022: Pt attends with spouse and daughter. Brings photos and memory book vehicle with. Discussion on best way to format memory book, rec using right side of book to encourage R visual scanning. Reviewed photos with pt,  able to name 75% of people pictures. Able to recall additional with mod-max-A from family or ST. Phonemic cues intermittently successful. Advised family on useful cueing strategies that appear beneficial for pt, additionally educate on cueing hierarchy. Usual min-A to scan R. Provide information on adult day programs in area per family request.  ? ?01-19-2022: Pt attends session with spouse and daughter. ST provides education and feedback on memory book design and strategies for usage at home. Daughter indicates she has begun collecting photos, they need to put together. ST recommends keeping pictures per page limited and adding simple and direct captions to assist with pt recall. Discussion on using memory book to facilitate conversations, orient pt to place and time, and remind pt of family members and other personally relevant topics. Education and coaching on facilitating right field scanning to compensate for right neglect. Spouse reports increased occurences of pt being able to find chair controller which is on right side of chair. Difficulty with knowing chair vs. TV remote. Generate strategy of marking each with appropriate picture to provide visual aid to assist in recognizing tool for ease of use.  ? ?01-06-22: Lead pt and spouse through activity in which pt recalls details about events, people, and places which are personally relevant and important to the patient. Education on purpose and use of a memory/orientation book to assist w/ patient's memory impairment. Began generating a list of topics to include in book. Husband demonstrates appropriate cueing (semantic cues and questioning cues primarily) to encourage pt to recall important information. Lubertha Basque tells [place] he will try and gather photos to begin constructing memory book next session.  ? ?PATIENT EDUCATION: ?Education details: see above ?Person educated: Patient and Spouse ?Education method: Explanation and Verbal cues ?Education comprehension: verbal  cues required and needs further education ?  ?  ?GOALS: ?Goals reviewed with patient? Yes ?  ?SHORT TERM GOALS: Target date: 01/25/2022 ?  ?Caregivers will use 2 external aids for pt to find chair controls on the right side of her chair with 50% less cueing from spouse, subjectively ?Baseline: 01/19/22, 01/21/2022 ?Goal status: GOAL PARTIALLY MET ?  ?2.  Caregivers will carryover 2 external aids to reduce pt from getting up with out asking for help 50% less subjectively by spouse ?Goal status: ONGOING ?  ?3.  Pt and Caregivers will use memory book for pt to recall 8 family names with usual min A  ? Baseline: 01/19/22, 01/21/2022 ?Goal status: GOAL PARTIALLY MET ?  ?  ?  ?  ?LONG TERM GOALS: Target date: 03/22/2022 ?  ?Caregivers will use external aids for memory in 2 additional situations where safety for frustration are a concern ? Goal status: ONGOING ?  ?2.  Caregivers and pt will use memory book to recall 2 pertinent facts about 5 family members with usual min A ?Goal status: ONGOING ?  ?  3.  Caregivers/pt will use external memory aids or memory book to recall personally relevant places, names, trips etc over 4 sessions ?Goal status: ONGOING ?  ?4.  Caregivers/pt will use external aids to recall that she is home, with 50% reduction in confusion /frustration about this reported subjectively by spouse ?Goal status: ONGOING ?  ?5.  Pt will attend to the right side of her memory book with external aids and verbal cues to locate information 3/5x with occasional min A ? Goal status: ONGOING ?  ?ASSESSMENT: ?  ?CLINICAL IMPRESSION: ?Zellie Jenning  presents with impaired memory, orientation, attention, executive functioning. She had MCI dx since 2019. Significant cognitive impairment, impacting pt's ability to recall personally relevant information. Pt is currently requiring full time care. Lubertha Basque is managing all finances, cooking, shopping and meds. He has care attendants a couple days a week who help with cleaning and bathing.  Camrynn verbalizes poor awareness of her cognitive impairment, refernces [name] to fill in information many times throghout session. Right visual field cut affects her ability to attend to the right side. [place]

## 2022-01-28 ENCOUNTER — Ambulatory Visit: Payer: Medicare Other

## 2022-01-28 ENCOUNTER — Other Ambulatory Visit: Payer: Self-pay | Admitting: Physician Assistant

## 2022-01-28 ENCOUNTER — Ambulatory Visit: Payer: Medicare Other | Admitting: Speech Pathology

## 2022-01-28 DIAGNOSIS — R41841 Cognitive communication deficit: Secondary | ICD-10-CM

## 2022-01-28 DIAGNOSIS — R2681 Unsteadiness on feet: Secondary | ICD-10-CM

## 2022-01-28 DIAGNOSIS — R2689 Other abnormalities of gait and mobility: Secondary | ICD-10-CM

## 2022-01-28 DIAGNOSIS — M6281 Muscle weakness (generalized): Secondary | ICD-10-CM

## 2022-01-28 NOTE — Therapy (Signed)
?OUTPATIENT PHYSICAL THERAPY TREATMENT NOTE ? ? ?Patient Name: Caroline [name] ?MRN: [id number] ?DOB:1936/05/16, 86 y.o., female ?Today's Date: 01/28/2022 ? ?PCP: Ronnell Freshwater, NP ?REFERRING PROVIDER: Ronnell Freshwater, NP ? ?END OF SESSION:  ? PT End of Session - 01/28/22 1319   ? ? Visit Number 6   ? Number of Visits 17   plus eval  ? Date for PT Re-Evaluation 03/22/22   ? Authorization Type [employer]   ? Progress Note Due on Visit 10   ? PT Start Time 0076   ? PT Stop Time 2263   ? PT Time Calculation (min) 40 min   ? Equipment Utilized During Treatment Gait belt   ? Activity Tolerance Patient tolerated treatment well   ? Behavior During Therapy [facility] for tasks assessed/performed   ? ?  ?  ? ?  ? ? ?Past Medical History:  ?Diagnosis Date  ? Anxiety   ? Arthritis   ? Celiac sprue   ? CVA (cerebral vascular accident) ([place]) 02/2018  ? R thalamic CVA  ? GERD (gastroesophageal reflux disease)   ? Glaucoma   ? Hormone replacement therapy (HRT)   ? Hyperlipidemia   ? Hypertension   ? Hypoglycemia   ? Hypothyroidism   ? OAB (overactive bladder)   ? Osteoporosis   ? PONV (postoperative nausea and vomiting)   ? Raynaud's disease   ? Scoliosis   ? Seasonal allergies   ? Senile purpura ([place])   ? 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 Right Cheek  ? 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 Bridge Left Nose, and Left Neck  ? 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 Right Cheek Tx: curret x 3 and 5FU  ? 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 Bridge of nose tx Cx3 and 5FU, Left neck Tx Cx3 and 5FU  ? Thrombophlebitis   ? ?Past Surgical History:  ?Procedure Laterality Date  ? ABDOMINAL HYSTERECTOMY  1979  ? CATARACT EXTRACTION, BILATERAL    ? knee surgery Left   ? VARICOSE VEIN SURGERY Left   ? XI ROBOTIC ASSISTED PARAESOPHAGEAL HERNIA REPAIR N/A 06/15/2021  ? Procedure: XI ROBOTIC ASSISTED PARAESOPHAGEAL HERNIA REPAIR, GASTROPEXY, UPPER ENDOSCOPY;  Surgeon:  Clovis Riley, MD;  Location: [facility];  Service: General;  Laterality: N/A;  ? ?Patient Active Problem List  ? Diagnosis Date Noted  ? Chronic pain of both knees 01/24/2022  ? Urinary incontinence 11/29/2021  ? Multiple pulmonary nodules 11/29/2021  ? Hypertrophic toenail 11/29/2021  ? Confusion 10/25/2021  ? Vitamin B12 deficiency 10/25/2021  ? DVT (deep venous thrombosis) ([place]) 07/30/2021  ? Dysphagia, post-stroke   ? Hypoalbuminemia due to protein-calorie malnutrition ([place])   ? Benign essential HTN   ? Dyslipidemia   ? Delirium due to multiple etiologies 06/17/2021  ? S/P repair of paraesophageal hernia 06/15/2021  ? CVA (cerebral vascular accident) ([place]) 02/09/2018  ? Hypothyroidism 02/08/2018  ? Essential hypertension 02/08/2018  ? Acute ischemic stroke ([place]) 02/08/2018  ? Chronic urinary tract infection 02/08/2018  ? Acute cystitis without hematuria   ? Right thalamic stroke [facility])   ? Hyperlipidemia   ? ? 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 ?THERAPY DIAG:  ?Other abnormalities of gait and mobility ? ?Muscle weakness (generalized) ? ?Unsteadiness on feet ? ?PERTINENT HISTORY: Left PCA distribution infarct with right homonymous hemianopsia right neglect and cognitive deficits; ?  ? ?  PRECAUTIONS: Falls ? ?SUBJECTIVE:   Pt walking in on walker with husband. Pt reports that her allergies are bothering her some. Denies any falls. ?PAIN:  ?Are you having pain? No ? ? ? ?TODAY'S TREATMENT:  ? ? ? ?GAIT: ?Gait pattern: step through pattern, decreased step length- Right, decreased step length- Left, and trunk flexed ?Distance walked: 230' ?Assistive device utilized:  HHA ?Level of assistance: Min A ?Comments: Walked in to clinic with RW with needing cuing for direction and to stay closer to walker. Verbal cues to try to keep head up.  ? ?NMR: gait walking between 3 sets of cone pairs of different colors about 16" apart  x 3 bouts naming color on  each side before stepping through, then switched to cones 4" apart walking through and naming colors for larger scan, then walking around cones naming color as she went for turning with scanning. Gait around in clinic with scanning to find 6 cone scattered throughout. Pt needed occasional cue but overall did well finding them all with only minimal cuing. ? ?Sit to stand x 5 from low mat without UE support getting balance each time and controlling descent.  ?Sit to stand with airex under feet with UE support this time with cues to lean forward more to help bring bottom up x 5 then getting balance with cues to keep toes down. ? ?In // bars: standing on airex without UE support x 30 sec with verbal cues to keep head up looking at herself in mirror, then adding in looking to right at husband across room and back to herself in mirror x 5, tapping airex with 1 UE support x 5 each leg. ? ?During session pt needed cues for safe transfer when going to mat to turn all the way and back up completely with legs touching before reaching back to sit. ? ?Home Program: ? ?Access Code: [email or web address] ?URL: [email or web address] ?Date: 01/26/2022 ?Prepared by: Baldomero Lamy ? ?Exercises ?- Sit to Stand with Armchair  - 1 x daily - 5 x weekly - 2 sets - 5 reps ?- Clamshell  - 1 x daily - 5 x weekly - 1 sets - 10 reps ?- Standing Toe Taps at Counter  - 1 x daily - 5 x weekly - 1 sets - 10 reps - educated on completing at countertop with UE support and supervision from family member  ?  ?  ?PATIENT EDUCATION: ? ?  ?  ?  ?GOALS: ?Goals reviewed with patient? Yes ?  ?SHORT TERM GOALS: Target date: 01/25/2022 ?  ?Pt will perform initial HEP w/min A from husband for improved strength, balance, transfers and gait. ?  ?Baseline: reports independence with current HEP with husband assitance ?Goal status: MET ?  ?2.  Pt will improve Berg Balance to >/= 30/56 to demonstrate improved standing balance and reduced fall risk ?Baseline: 26/56;  34/56 ?Goal status: MET ?  ?3.  Update goal when 5x STS complete and write LTG as appropriate  ?Baseline: 32.38 seconds ?Goal status: MET ?  ?4.  Pt and caregiver will report 4 or fewer falls since eval for reduced fall frequency and improved safety awareness at home ?Baseline: no falls per husband reports ?Goal status: MET ?  ?5.  Pt will trial use of posterior walker for improved balance and safety with functional mobility  ?Baseline: currently using RW; not safe option at this time.  ?Goal status: deferred ?  ?  ?LONG TERM GOALS: Target date: 02/22/2022 ?  ?Pt will  perform final HEP w/min A from husband for improved strength, balance, transfers and gait. ?  ?Baseline:  ?Goal status: INITIAL ?  ?2.  Pt will improve gait velocity to at least 1.9 ft/s with LRAD for improved gait efficiency and performance at community ambulator level without recurrent fall risk ?  ?Baseline: 1.46 ft/s with RW ?Goal status: INITIAL ?  ?3.  Pt will improve Berg Balance to >/= 35/56 to demonstrate improved standing balance and reduced fall risk ?Baseline: 26/56 ?Goal status: INITIAL ?  ?4. Pt will improve 5x STS to </= 25 sec to demo improved functional LE strength and balance  ?Baseline: 32.38 seconds  ?Goal status: INITIAL ?  ?5.  Pt and caregiver will teach back techniques for fall prevention in the home and proper fall recovery for improved safety  ?Baseline:  ?Goal status: INITIAL ?  ?5.  Pt will improve FOTO to 62% or greater for improved functional mobility  ?Baseline: 57% ?Goal status: INITIAL ?  ?  ?ASSESSMENT: ?  ?CLINICAL IMPRESSION: ?PT continued to work on gait with HHA incorporating more scanning activities. Pt did better with scanning but does need some cuing to look to the right. Pt reporting some low back pain when up longer periods but did well with rest breaks between. ?  ?OBJECTIVE IMPAIRMENTS Abnormal gait, decreased activity tolerance, decreased balance, decreased cognition, decreased coordination, decreased  endurance, decreased knowledge of condition, decreased knowledge of use of DME, decreased mobility, difficulty walking, decreased safety awareness, dizziness, impaired perceived functional ability, impaired visio

## 2022-01-28 NOTE — Telephone Encounter (Signed)
Can you find out from the husband who prescribed this for the patient and ask if she is still taking it? I have not given this to her in the past.

## 2022-01-28 NOTE — Therapy (Signed)
?OUTPATIENT SPEECH LANGUAGE PATHOLOGY TREATMENT NOTE ? ? ?Patient Name: Caroline [name] ?MRN: [id number] ?DOB:1936-04-24, 86 y.o., female ?Today's Date: 01/28/2022 ? ?PCP: Ronnell Freshwater, NP ?REFERRING PROVIDER: Charlett Blake, MD  ? ?END OF SESSION:  ? End of Session - 01/28/22 1359   ? ? Visit Number 6   ? Number of Visits 25   ? Date for SLP Re-Evaluation 03/22/22   ? SLP Start Time 1400   ? SLP Stop Time  1445   ? SLP Time Calculation (min) 45 min   ? Activity Tolerance Patient tolerated treatment well   ? ?  ?  ? ?  ? ? ? ?Past Medical History:  ?Diagnosis Date  ? Anxiety   ? Arthritis   ? Celiac sprue   ? CVA (cerebral vascular accident) ([place]) 02/2018  ? R thalamic CVA  ? GERD (gastroesophageal reflux disease)   ? Glaucoma   ? Hormone replacement therapy (HRT)   ? Hyperlipidemia   ? Hypertension   ? Hypoglycemia   ? Hypothyroidism   ? OAB (overactive bladder)   ? Osteoporosis   ? PONV (postoperative nausea and vomiting)   ? Raynaud's disease   ? Scoliosis   ? Seasonal allergies   ? Senile purpura ([place])   ? 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 Right Cheek  ? 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 Bridge Left Nose, and Left Neck  ? 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 Right Cheek Tx: curret x 3 and 5FU  ? 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 Bridge of nose tx Cx3 and 5FU, Left neck Tx Cx3 and 5FU  ? Thrombophlebitis   ? ?Past Surgical History:  ?Procedure Laterality Date  ? ABDOMINAL HYSTERECTOMY  1979  ? CATARACT EXTRACTION, BILATERAL    ? knee surgery Left   ? VARICOSE VEIN SURGERY Left   ? XI ROBOTIC ASSISTED PARAESOPHAGEAL HERNIA REPAIR N/A 06/15/2021  ? Procedure: XI ROBOTIC ASSISTED PARAESOPHAGEAL HERNIA REPAIR, GASTROPEXY, UPPER ENDOSCOPY;  Surgeon: Clovis Riley, MD;  Location: [facility];  Service: General;  Laterality: N/A;  ? ?Patient Active Problem List  ? Diagnosis Date Noted  ? Chronic pain of both knees 01/24/2022  ? Urinary  incontinence 11/29/2021  ? Multiple pulmonary nodules 11/29/2021  ? Hypertrophic toenail 11/29/2021  ? Confusion 10/25/2021  ? Vitamin B12 deficiency 10/25/2021  ? DVT (deep venous thrombosis) ([place]) 07/30/2021  ? Dysphagia, post-stroke   ? Hypoalbuminemia due to protein-calorie malnutrition ([place])   ? Benign essential HTN   ? Dyslipidemia   ? Delirium due to multiple etiologies 06/17/2021  ? S/P repair of paraesophageal hernia 06/15/2021  ? CVA (cerebral vascular accident) ([place]) 02/09/2018  ? Hypothyroidism 02/08/2018  ? Essential hypertension 02/08/2018  ? Acute ischemic stroke ([place]) 02/08/2018  ? Chronic urinary tract infection 02/08/2018  ? Acute cystitis without hematuria   ? Right thalamic stroke [facility])   ? Hyperlipidemia   ? ? ?ONSET DATE: 06/15/2021 ? 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 ?THERAPY DIAG:  ?Cognitive communication deficit ? ?SUBJECTIVE: "It's been busy"  ? ?PAIN:  ?Are you having pain? Yes  ?Comments: reports slight soreness in back ? ? ?OBJECTIVE:  ? ?TODAY'S TREATMENT: ?SKILLED INTERVENTIONS: ?01-28-2022: Target recall of pertinent places by generating memories related to x3 places pt has lived. Pt able to generate list of 4 for each place with  usual mod-A in form of questioning cues, semantic cues, or phonemic cues. During session pt became fixated on Mother being alive. Husband talked pt through visiting father's cemetary to lay mother to rest. Husband using appropriate questioning cues to lead pt through story and pt able to have self-realization of mother's passing. Education on using this story as a helpful cornerstone for orientation to present circumstances for pt. Suggest incorporating into memory book if beneficial for pt.  ? ?01-26-2022: Target orientation to present through guided discussion regarding daughters recent visit. Able to relay x3 pertinent, accurate details with min-A. Generated modification to be used for  chair controller and TV remote d/t pt and spouse reporting difficulty with usage and telling them apart. Education provided on implementing tool to be able to use for calling for assistance if Lubertha Basque is outside. Discussion on modifying potential tools for ease of use. Spouse to buy and bring in for modification at future session. Engage in cognitive exercise with use of R visual scanning. Able to generate targets 11/15 trials, mod-I. Usual min-A to fully scan R. ? ?01-21-2022: Pt attends with spouse and daughter. Brings photos and memory book vehicle with. Discussion on best way to format memory book, rec using right side of book to encourage R visual scanning. Reviewed photos with pt, able to name 75% of people pictures. Able to recall additional with mod-max-A from family or ST. Phonemic cues intermittently successful. Advised family on useful cueing strategies that appear beneficial for pt, additionally educate on cueing hierarchy. Usual min-A to scan R. Provide information on adult day programs in area per family request.  ? ?PATIENT EDUCATION: ?Education details: see above ?Person educated: Patient and Spouse ?Education method: Explanation and Verbal cues ?Education comprehension: verbal cues required and needs further education ?  ?  ?GOALS: ?Goals reviewed with patient? Yes ?  ?SHORT TERM GOALS: Target date: 01/25/2022 ?  ?Caregivers will use 2 external aids for pt to find chair controls on the right side of her chair with 50% less cueing from spouse, subjectively ?Baseline: 01/19/22, 01/21/2022 ?Goal status: GOAL PARTIALLY MET ?  ?2.  Caregivers will carryover 2 external aids to reduce pt from getting up with out asking for help 50% less subjectively by spouse ?Goal status: ONGOING ?  ?3.  Pt and Caregivers will use memory book for pt to recall 8 family names with usual min A  ? Baseline: 01/19/22, 01/21/2022 ?Goal status: GOAL PARTIALLY MET ?  ?  ?  ?  ?LONG TERM GOALS: Target date: 03/22/2022 ?  ?Caregivers will  use external aids for memory in 2 additional situations where safety for frustration are a concern ? Goal status: ONGOING ?  ?2.  Caregivers and pt will use memory book to recall 2 pertinent facts about 5 family members with usual min A ?Goal status: ONGOING ?  ?3.  Caregivers/pt will use external memory aids or memory book to recall personally relevant places, names, trips etc over 4 sessions ?Goal status: ONGOING ?  ?4.  Caregivers/pt will use external aids to recall that she is home, with 50% reduction in confusion /frustration about this reported subjectively by spouse ?Goal status: ONGOING ?  ?5.  Pt will attend to the right side of her memory book with external aids and verbal cues to locate information 3/5x with occasional min A ? Goal status: ONGOING ?  ?ASSESSMENT: ?  ?CLINICAL IMPRESSION: ?Caroline [name]  presents with impaired memory, orientation, attention, executive functioning. She had MCI dx since 2019. Significant  cognitive impairment, impacting pt's ability to recall personally relevant information. Pt is currently requiring full time care. Lubertha Basque is managing all finances, cooking, shopping and meds. He has care attendants a couple days a week who help with cleaning and bathing. Ann verbalizes poor awareness of her cognitive impairment, refernces [name] to fill in information many times throghout session. Right visual field cut affects her ability to attend to the right side. [name] reports she has difficulty recalling/attending to the controls on the right bottom of her chair that help her get up and he repeatedly cues her for this. He also reports she gets up with out his knowing and he finds this a safety issue. I recommend skilled ST to maximize cognition, train caregivers in compensatory strategies to support memory in daily activities and use of external memory aids for safety and to reduce caregiver burden. ? ?OBJECTIVE IMPAIRMENTS include attention, memory, awareness, and executive  functioning. These impairments are limiting patient from  making safe decisions and recalling pertinent information such as family names . ?Factors affecting potential to achieve goals and functional outcome are me

## 2022-02-01 ENCOUNTER — Ambulatory Visit: Payer: Medicare Other | Attending: Nurse Practitioner

## 2022-02-01 ENCOUNTER — Encounter: Payer: Self-pay | Admitting: Speech Pathology

## 2022-02-01 ENCOUNTER — Ambulatory Visit: Payer: Medicare Other | Admitting: Speech Pathology

## 2022-02-01 ENCOUNTER — Telehealth: Payer: Self-pay | Admitting: Neurology

## 2022-02-01 ENCOUNTER — Encounter: Payer: Medicare Other | Admitting: Occupational Therapy

## 2022-02-01 DIAGNOSIS — R2689 Other abnormalities of gait and mobility: Secondary | ICD-10-CM | POA: Insufficient documentation

## 2022-02-01 DIAGNOSIS — R2681 Unsteadiness on feet: Secondary | ICD-10-CM | POA: Diagnosis present

## 2022-02-01 DIAGNOSIS — M6281 Muscle weakness (generalized): Secondary | ICD-10-CM | POA: Diagnosis present

## 2022-02-01 DIAGNOSIS — R296 Repeated falls: Secondary | ICD-10-CM | POA: Diagnosis present

## 2022-02-01 DIAGNOSIS — R41841 Cognitive communication deficit: Secondary | ICD-10-CM | POA: Insufficient documentation

## 2022-02-01 NOTE — Telephone Encounter (Signed)
Medication issues, needs appt ?

## 2022-02-01 NOTE — Therapy (Signed)
?OUTPATIENT SPEECH LANGUAGE PATHOLOGY TREATMENT NOTE ? ? ?Patient Name: Caroline [name] ?MRN: [id number] ?DOB:1935/12/10, 86 y.o., female ?Today's Date: 02/01/2022 ? ?PCP: Ronnell Freshwater, NP ?REFERRING PROVIDER: Charlett Blake, MD  ? ?END OF SESSION:  ? End of Session - 02/01/22 1545   ? ? Visit Number 7   ? Number of Visits 25   ? Date for SLP Re-Evaluation 03/22/22   ? SLP Start Time 1232   ? SLP Stop Time  1315   ? SLP Time Calculation (min) 43 min   ? Activity Tolerance Patient tolerated treatment well   ? ?  ?  ? ?  ? ? ? ?Past Medical History:  ?Diagnosis Date  ? Anxiety   ? Arthritis   ? Celiac sprue   ? CVA (cerebral vascular accident) ([place]) 02/2018  ? R thalamic CVA  ? GERD (gastroesophageal reflux disease)   ? Glaucoma   ? Hormone replacement therapy (HRT)   ? Hyperlipidemia   ? Hypertension   ? Hypoglycemia   ? Hypothyroidism   ? OAB (overactive bladder)   ? Osteoporosis   ? PONV (postoperative nausea and vomiting)   ? Raynaud's disease   ? Scoliosis   ? Seasonal allergies   ? Senile purpura ([place])   ? 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 Right Cheek  ? 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 Bridge Left Nose, and Left Neck  ? 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 Right Cheek Tx: curret x 3 and 5FU  ? 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 Bridge of nose tx Cx3 and 5FU, Left neck Tx Cx3 and 5FU  ? Thrombophlebitis   ? ?Past Surgical History:  ?Procedure Laterality Date  ? ABDOMINAL HYSTERECTOMY  1979  ? CATARACT EXTRACTION, BILATERAL    ? knee surgery Left   ? VARICOSE VEIN SURGERY Left   ? XI ROBOTIC ASSISTED PARAESOPHAGEAL HERNIA REPAIR N/A 06/15/2021  ? Procedure: XI ROBOTIC ASSISTED PARAESOPHAGEAL HERNIA REPAIR, GASTROPEXY, UPPER ENDOSCOPY;  Surgeon: Clovis Riley, MD;  Location: [facility];  Service: General;  Laterality: N/A;  ? ?Patient Active Problem List  ? Diagnosis Date Noted  ? Chronic pain of both knees 01/24/2022  ? Urinary  incontinence 11/29/2021  ? Multiple pulmonary nodules 11/29/2021  ? Hypertrophic toenail 11/29/2021  ? Confusion 10/25/2021  ? Vitamin B12 deficiency 10/25/2021  ? DVT (deep venous thrombosis) ([place]) 07/30/2021  ? Dysphagia, post-stroke   ? Hypoalbuminemia due to protein-calorie malnutrition ([place])   ? Benign essential HTN   ? Dyslipidemia   ? Delirium due to multiple etiologies 06/17/2021  ? S/P repair of paraesophageal hernia 06/15/2021  ? CVA (cerebral vascular accident) ([place]) 02/09/2018  ? Hypothyroidism 02/08/2018  ? Essential hypertension 02/08/2018  ? Acute ischemic stroke ([place]) 02/08/2018  ? Chronic urinary tract infection 02/08/2018  ? Acute cystitis without hematuria   ? Right thalamic stroke [facility])   ? Hyperlipidemia   ? ? ?ONSET DATE: 06/15/2021 ? 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 ?THERAPY DIAG:  ?Cognitive communication deficit ? ?SUBJECTIVE: "Nothing much" ? ?PAIN:  ?Are you having pain? No  ?Comments: reports slight soreness in back ? ? ?OBJECTIVE:  ? ?TODAY'S TREATMENT: ?SKILLED INTERVENTIONS: ? ?02-01-22:  Lauren and her spouse enter with frustration. She is wandering the house without her walker, has increasing confusion about who has died.She is having loose stools  in the am. During this session, she is confused about who lives in her house, stating her daughters live at home still and she leaves the door open for them in case they forget their key. She expects her daughter who passed to be at home in her bed. She asks her husband where her house is when she is at home. She is becoming somewhat belligerent. Today, we reviewed where her seat control is with cues to reach further down. Memory book should be arriving this week. Educated spouse that if confronting [place]ia that her relatives are dead results in her distress to use empathic response, such as "I know you miss Abigail Butts." Then distract or redirect her. Encouraged him to get more  help in the house. He agrees to increase home aids to 4x a week. Educated that we can provide modified cognitive activities for aids to do with her. Trained them in modifying card sort activity so [place] has success and as a tool to redirect her when she is upset or perseverating on where her home is.  ? ? ?01-28-2022: Target recall of pertinent places by generating memories related to x3 places pt has lived. Pt able to generate list of 4 for each place with usual mod-A in form of questioning cues, semantic cues, or phonemic cues. During session pt became fixated on Mother being alive. Husband talked pt through visiting father's cemetary to lay mother to rest. Husband using appropriate questioning cues to lead pt through story and pt able to have self-realization of mother's passing. Education on using this story as a helpful cornerstone for orientation to present circumstances for pt. Suggest incorporating into memory book if beneficial for pt.  ? ?01-26-2022: Target orientation to present through guided discussion regarding daughters recent visit. Able to relay x3 pertinent, accurate details with min-A. Generated modification to be used for chair controller and TV remote d/t pt and spouse reporting difficulty with usage and telling them apart. Education provided on implementing tool to be able to use for calling for assistance if [place] is outside. Discussion on modifying potential tools for ease of use. Spouse to buy and bring in for modification at future session. Engage in cognitive exercise with use of R visual scanning. Able to generate targets 11/15 trials, mod-I. Usual min-A to fully scan R. ? ?01-21-2022: Pt attends with spouse and daughter. Brings photos and memory book vehicle with. Discussion on best way to format memory book, rec using right side of book to encourage R visual scanning. Reviewed photos with pt, able to name 75% of people pictures. Able to recall additional with mod-max-A from family or ST.  Phonemic cues intermittently successful. Advised family on useful cueing strategies that appear beneficial for pt, additionally educate on cueing hierarchy. Usual min-A to scan R. Provide information on adult day programs in area per family request.  ? ?PATIENT EDUCATION: ?Education details: see above ?Person educated: Patient and Spouse ?Education method: Explanation and Verbal cues ?Education comprehension: verbal cues required and needs further education ?  ?  ?GOALS: ?Goals reviewed with patient? Yes ?  ?SHORT TERM GOALS: Target date: 01/25/2022 ?  ?Caregivers will use 2 external aids for pt to find chair controls on the right side of her chair with 50% less cueing from spouse, subjectively ?Baseline: 01/19/22, 01/21/2022 ?Goal status: GOAL PARTIALLY MET ?  ?2.  Caregivers will carryover 2 external aids to reduce pt from getting up with out asking for help 50% less subjectively by spouse ?Goal status: ONGOING ?  ?  3.  Pt and Caregivers will use memory book for pt to recall 8 family names with usual min A  ? Baseline: 01/19/22, 01/21/2022 ?Goal status: GOAL PARTIALLY MET ?  ?  ?  ?  ?LONG TERM GOALS: Target date: 03/22/2022 ?  ?Caregivers will use external aids for memory in 2 additional situations where safety for frustration are a concern ? Goal status: ONGOING ?  ?2.  Caregivers and pt will use memory book to recall 2 pertinent facts about 5 family members with usual min A ?Goal status: ONGOING ?  ?3.  Caregivers/pt will use external memory aids or memory book to recall personally relevant places, names, trips etc over 4 sessions ?Goal status: ONGOING ?  ?4.  Caregivers/pt will use external aids to recall that she is home, with 50% reduction in confusion /frustration about this reported subjectively by spouse ?Goal status: ONGOING ?  ?5.  Pt will attend to the right side of her memory book with external aids and verbal cues to locate information 3/5x with occasional min A ? Goal status: ONGOING ?  ?ASSESSMENT: ?   ?CLINICAL IMPRESSION: ?Cynda Soule  presents with impaired memory, orientation, attention, executive functioning. She had MCI dx since 2019. Significant cognitive impairment, impacting pt's ability to recall perso

## 2022-02-01 NOTE — Telephone Encounter (Signed)
I spoke to the patient's husband. Reports her UTI has cleared and she has been placed on a maintenance antibiotic. He felt her behavior, mood, and memory have all continued to decline. He has stopped the memantine now. She has a pending appt 02/04/22 for re-evaluation. ?

## 2022-02-01 NOTE — Therapy (Signed)
?OUTPATIENT PHYSICAL THERAPY TREATMENT NOTE ? ? ?Patient Name: Caroline [name] ?MRN: [id number] ?DOB:1936/02/28, 86 y.o., female ?Today's Date: 02/01/2022 ? ?PCP: Ronnell Freshwater, NP ?REFERRING PROVIDER: Ronnell Freshwater, NP ? ? PT End of Session - 02/01/22 1323   ? ? Visit Number 7   ? Number of Visits 17   plus eval  ? Date for PT Re-Evaluation 03/22/22   ? Authorization Type [employer]   ? Progress Note Due on Visit 10   ? PT Start Time 0175   ? PT Stop Time 1400   ? PT Time Calculation (min) 43 min   ? Equipment Utilized During Treatment Gait belt   ? Activity Tolerance Patient tolerated treatment well   ? Behavior During Therapy [facility] for tasks assessed/performed   ? ?  ?  ? ?  ? ? ?Past Medical History:  ?Diagnosis Date  ? Anxiety   ? Arthritis   ? Celiac sprue   ? CVA (cerebral vascular accident) ([place]) 02/2018  ? R thalamic CVA  ? GERD (gastroesophageal reflux disease)   ? Glaucoma   ? Hormone replacement therapy (HRT)   ? Hyperlipidemia   ? Hypertension   ? Hypoglycemia   ? Hypothyroidism   ? OAB (overactive bladder)   ? Osteoporosis   ? PONV (postoperative nausea and vomiting)   ? Raynaud's disease   ? Scoliosis   ? Seasonal allergies   ? Senile purpura ([place])   ? 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 Right Cheek  ? 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 Bridge Left Nose, and Left Neck  ? 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 Right Cheek Tx: curret x 3 and 5FU  ? 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 Bridge of nose tx Cx3 and 5FU, Left neck Tx Cx3 and 5FU  ? Thrombophlebitis   ? ?Past Surgical History:  ?Procedure Laterality Date  ? ABDOMINAL HYSTERECTOMY  1979  ? CATARACT EXTRACTION, BILATERAL    ? knee surgery Left   ? VARICOSE VEIN SURGERY Left   ? XI ROBOTIC ASSISTED PARAESOPHAGEAL HERNIA REPAIR N/A 06/15/2021  ? Procedure: XI ROBOTIC ASSISTED PARAESOPHAGEAL HERNIA REPAIR, GASTROPEXY, UPPER ENDOSCOPY;  Surgeon: Clovis Riley, MD;   Location: [facility];  Service: General;  Laterality: N/A;  ? ?Patient Active Problem List  ? Diagnosis Date Noted  ? Chronic pain of both knees 01/24/2022  ? Urinary incontinence 11/29/2021  ? Multiple pulmonary nodules 11/29/2021  ? Hypertrophic toenail 11/29/2021  ? Confusion 10/25/2021  ? Vitamin B12 deficiency 10/25/2021  ? DVT (deep venous thrombosis) ([place]) 07/30/2021  ? Dysphagia, post-stroke   ? Hypoalbuminemia due to protein-calorie malnutrition ([place])   ? Benign essential HTN   ? Dyslipidemia   ? Delirium due to multiple etiologies 06/17/2021  ? S/P repair of paraesophageal hernia 06/15/2021  ? CVA (cerebral vascular accident) ([place]) 02/09/2018  ? Hypothyroidism 02/08/2018  ? Essential hypertension 02/08/2018  ? Acute ischemic stroke ([place]) 02/08/2018  ? Chronic urinary tract infection 02/08/2018  ? Acute cystitis without hematuria   ? Right thalamic stroke [facility])   ? Hyperlipidemia   ? ? 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 ?THERAPY DIAG:  ?Other abnormalities of gait and mobility ? ?Muscle weakness (generalized) ? ?Unsteadiness on feet ? ?Repeated falls ? ?PERTINENT HISTORY: Left PCA distribution infarct with right homonymous hemianopsia right neglect and cognitive deficits; ?  ? ?  PRECAUTIONS: Falls ? ?SUBJECTIVE:   Patient reports no new changes/complaints. No pain or no falls.  ? ?PAIN:  ?Are you having pain? No ? ? ?TODAY'S TREATMENT:  ?TherEx: ?Completed SciFit on Level 1.5 with BLE only x 6 minutes. Patient tolerating well, did require intermittent cues to maintain steps per minute >/= 80 and large reciprocal movements.  ? ?GAIT: ?Gait pattern: step through pattern, decreased step length- Right, decreased step length- Left, and trunk flexed ?Distance walked: 230' ?Assistive device utilized:  HHA ?Level of assistance: Min A ?Comments: Walked in to clinic with RW with needing cuing for direction and to stay closer to walker to promote  improved posture. Rest of session spent completing gait with HHA and without AD and CGA to further challenge balance. Cues to fully turn around and have legs touching surface prior to descent.  ? ? ?NMR:  ?PT placing cones in various spots on R/L sides of therapy gym, completed ambulation with visual scanning with HHA from PT, completed x 250'. Intermittent stops and cues to stand up tall for improved posture, increased forward lean with fatigue.. Frequent missing of item noted on R side, but also noted on L side as well. Mod Cues required for improved visual scanning to R/L.  ? ?In // bars: standing on firm surface with intermittent UE support, completed alternating toe tap to cones. PT calling out Right/Left and Color of [place] to tap too for increased cognitive challenge, completed mainly with UE support. Cues to stand tall throughout completion ? ?Standing on Airex without UE support, light touch as needed and CGA, completed standing bil stance with horizontal/vertical head turns x 5 reps each direction. Pt fearful with vertical > horizontal head turns.  ? ?Home Program: ?Access Code: [email or web address] ?  ?  ?  ?GOALS: ?Goals reviewed with patient? Yes ?  ?SHORT TERM GOALS: Target date: 01/25/2022 ?  ?Pt will perform initial HEP w/min A from husband for improved strength, balance, transfers and gait. ?  ?Baseline: reports independence with current HEP with husband assitance ?Goal status: MET ?  ?2.  Pt will improve Berg Balance to >/= 30/56 to demonstrate improved standing balance and reduced fall risk ?Baseline: 26/56; 34/56 ?Goal status: MET ?  ?3.  Update goal when 5x STS complete and write LTG as appropriate  ?Baseline: 32.38 seconds ?Goal status: MET ?  ?4.  Pt and caregiver will report 4 or fewer falls since eval for reduced fall frequency and improved safety awareness at home ?Baseline: no falls per husband reports ?Goal status: MET ?  ?5.  Pt will trial use of posterior walker for improved balance and safety with  functional mobility  ?Baseline: currently using RW; not safe option at this time.  ?Goal status: deferred ?  ?  ?LONG TERM GOALS: Target date: 02/22/2022 ?  ?Pt will perform final HEP w/min A from husband for improved strength, balance, transfers and gait. ?  ?Baseline:  ?Goal status: INITIAL ?  ?2.  Pt will improve gait velocity to at least 1.9 ft/s with LRAD for improved gait efficiency and performance at community ambulator level without recurrent fall risk ?  ?Baseline: 1.46 ft/s with RW ?Goal status: INITIAL ?  ?3.  Pt will improve Berg Balance to >/= 35/56 to demonstrate improved standing balance and reduced fall risk ?Baseline: 26/56 ?Goal status: INITIAL ?  ?4. Pt will improve 5x STS to </= 25 sec to demo improved functional LE strength and balance  ?Baseline: 32.38 seconds  ?Goal status: INITIAL ?  ?  5.  Pt and caregiver will teach back techniques for fall prevention in the home and proper fall recovery for improved safety  ?Baseline:  ?Goal status: INITIAL ?  ?5.  Pt will improve FOTO to 62% or greater for improved functional mobility  ?Baseline: 57% ?Goal status: INITIAL ?  ?  ?ASSESSMENT: ?  ?CLINICAL IMPRESSION: ?Today's skilled PT session focused on continued gait activities with HHA, as well as added visual scanning and cognitive challenge with balance activities to further challenge patient. Frequent cues for visual scanning and posture required today. Will continue per POC.  ?  ?OBJECTIVE IMPAIRMENTS Abnormal gait, decreased activity tolerance, decreased balance, decreased cognition, decreased coordination, decreased endurance, decreased knowledge of condition, decreased knowledge of use of DME, decreased mobility, difficulty walking, decreased safety awareness, dizziness, impaired perceived functional ability, impaired vision/preception, and postural dysfunction.  ?  ?ACTIVITY LIMITATIONS cleaning, driving, meal prep, laundry, and medication management.  ?  ?PERSONAL FACTORS Age, Fitness, Time since  onset of injury/illness/exacerbation, and 1-2 comorbidities: R homonymous hemianopsia and impaired memory safety awareness  are also affecting patient's functional outcome.  ?  ?  ?REHAB POTENTIAL: Fair due

## 2022-02-03 NOTE — Therapy (Signed)
?OUTPATIENT SPEECH LANGUAGE PATHOLOGY TREATMENT NOTE ? ? ?Patient Name: Caroline [name] ?MRN: [id number] ?DOB:[birth year], 86 y.o., female ?Today's Date: 02/04/2022 ? ?PCP: Ronnell Freshwater, NP ?REFERRING PROVIDER: Charlett Blake, MD  ? ?END OF SESSION:  ? End of Session - 02/04/22 1231   ? ? Visit Number 8   ? Number of Visits 25   ? Date for SLP Re-Evaluation 03/22/22   ? SLP Start Time 1231   ? SLP Stop Time  1315   ? SLP Time Calculation (min) 44 min   ? Activity Tolerance Patient tolerated treatment well   ? ?  ?  ? ?  ? ? ? ? ?Past Medical History:  ?Diagnosis Date  ? Anxiety   ? Arthritis   ? Celiac sprue   ? CVA (cerebral vascular accident) ([place]) 02/2018  ? R thalamic CVA  ? GERD (gastroesophageal reflux disease)   ? Glaucoma   ? Hormone replacement therapy (HRT)   ? Hyperlipidemia   ? Hypertension   ? Hypoglycemia   ? Hypothyroidism   ? OAB (overactive bladder)   ? Osteoporosis   ? PONV (postoperative nausea and vomiting)   ? Raynaud's disease   ? Scoliosis   ? Seasonal allergies   ? Senile purpura ([place])   ? 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 Right Cheek  ? 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 Bridge Left Nose, and Left Neck  ? 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 Right Cheek Tx: curret x 3 and 5FU  ? 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 Bridge of nose tx Cx3 and 5FU, Left neck Tx Cx3 and 5FU  ? Thrombophlebitis   ? ?Past Surgical History:  ?Procedure Laterality Date  ? ABDOMINAL HYSTERECTOMY  1979  ? CATARACT EXTRACTION, BILATERAL    ? knee surgery Left   ? VARICOSE VEIN SURGERY Left   ? XI ROBOTIC ASSISTED PARAESOPHAGEAL HERNIA REPAIR N/A 06/15/2021  ? Procedure: XI ROBOTIC ASSISTED PARAESOPHAGEAL HERNIA REPAIR, GASTROPEXY, UPPER ENDOSCOPY;  Surgeon: Clovis Riley, MD;  Location: [facility];  Service: General;  Laterality: N/A;  ? ?Patient Active Problem List  ? Diagnosis Date Noted  ? Chronic pain of both knees 01/24/2022  ? Urinary  incontinence 11/29/2021  ? Multiple pulmonary nodules 11/29/2021  ? Hypertrophic toenail 11/29/2021  ? Confusion 10/25/2021  ? Vitamin B12 deficiency 10/25/2021  ? DVT (deep venous thrombosis) () 07/30/2021  ? Dysphagia, post-stroke   ? Hypoalbuminemia due to protein-calorie malnutrition ([place])   ? Benign essential HTN   ? Dyslipidemia   ? Delirium due to multiple etiologies 06/17/2021  ? S/P repair of paraesophageal hernia 06/15/2021  ? CVA (cerebral vascular accident) ([place]) 02/09/2018  ? Hypothyroidism 02/08/2018  ? Essential hypertension 02/08/2018  ? Acute ischemic stroke ([place]) 02/08/2018  ? Chronic urinary tract infection 02/08/2018  ? Acute cystitis without hematuria   ? Right thalamic stroke [facility])   ? Hyperlipidemia   ? ? ?ONSET DATE: 06/15/2021 ? 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 ?THERAPY DIAG:  ?Cognitive communication deficit ? ?SUBJECTIVE: "was I at the dentist" ? ?PAIN:  ?Are you having pain? No  ? ?OBJECTIVE:  ? ?TODAY'S TREATMENT: ?SKILLED INTERVENTIONS: ?02-03-22: Pt presents with some fatigue but overall pleasant mood. Limited orientation, telling ST she was at dentist when she came from doctors. Husband reports they will be trying new medication and  he has additional caregivers coming x2 days/week to interact with Jeani Hawking. Provided recommendations on activities they can do with Jeani Hawking and some modifications to consider. Playing cards with appropriate modifications, planting flowers, playdough, looking at photo albums, sharing magazines and discussing, puzzles, folding laundry. Encouragement to continue targeting R visual scanning during meal times, in using chair, referencing preferred reading materials (magazines), doing activities. Memory book to arrive soon, will plan to bring to next session.  ? ? ?02-01-22:  Ashana and her spouse enter with frustration. She is wandering the house without her walker, has increasing confusion about  who has died.She is having loose stools in the am. During this session, she is confused about who lives in her house, stating her daughters live at home still and she leaves the door open for them in case they forget their key. She expects her daughter who passed to be at home in her bed. She asks her husband where her house is when she is at home. She is becoming somewhat belligerent. Today, we reviewed where her seat control is with cues to reach further down. Memory book should be arriving this week. Educated spouse that if confronting Korbyn that her relatives are dead results in her distress to use empathic response, such as "I know you miss Abigail Butts." Then distract or redirect her. Encouraged him to get more help in the house. He agrees to increase home aids to 4x a week. Educated that we can provide modified cognitive activities for aids to do with her. Trained them in modifying card sort activity so Ayame has success and as a tool to redirect her when she is upset or perseverating on where her home is.  ? ? ?PATIENT EDUCATION: ?Education details: see above ?Person educated: Patient and Spouse ?Education method: Explanation and Verbal cues ?Education comprehension: verbal cues required and needs further education ?  ?  ?GOALS: ?Goals reviewed with patient? Yes ?  ?SHORT TERM GOALS: Target date: 01/25/2022 ?  ?Caregivers will use 2 external aids for pt to find chair controls on the right side of her chair with 50% less cueing from spouse, subjectively ?Baseline: 01/19/22, 01/21/2022 ?Goal status: GOAL PARTIALLY MET ?  ?2.  Caregivers will carryover 2 external aids to reduce pt from getting up with out asking for help 50% less subjectively by spouse ?Goal status: GOAL NOT MET ?  ?3.  Pt and Caregivers will use memory book for pt to recall 8 family names with usual min A  ? Baseline: 01/19/22, 01/21/2022 ?Goal status: GOAL PARTIALLY MET ?  ?  ?  ?  ?LONG TERM GOALS: Target date: 03/22/2022 ?  ?Caregivers will use external  aids for memory in 2 additional situations where safety for frustration are a concern ? Goal status: ONGOING ?  ?2.  Caregivers and pt will use memory book to recall 2 pertinent facts about 5 family members with usual min A ?Goal status: ONGOING ?  ?3.  Caregivers/pt will use external memory aids or memory book to recall personally relevant places, names, trips etc over 4 sessions ?Goal status: ONGOING ?  ?4.  Caregivers/pt will use external aids to recall that she is home, with 50% reduction in confusion /frustration about this reported subjectively by spouse ?Goal status: ONGOING ?  ?5.  Pt will attend to the right side of her memory book with external aids and verbal cues to locate information 3/5x with occasional min A ? Goal status: ONGOING ?  ?ASSESSMENT: ?  ?CLINICAL IMPRESSION: ?Lorenda Ishihara  presents with impaired memory, orientation, attention, executive functioning. She had MCI dx since 2019. Significant cognitive impairment, impacting pt's ability to recall personally relevant information. Pt is currently requiring full time care. Lubertha Basque is managing all finances, cooking, shopping and meds. He has care attendants a couple days a week who help with cleaning and bathing. Soraya verbalizes poor awareness of her cognitive impairment. She insists her mother and other dead relatives are alive, asks where her home is when she is home, wanders without her walker despite her spouse asking her to remain seated. There is frustration from both of them. ? Dementia vs AD, instructed them to see Dr. Leonie Man about cognitive changes.. I recommend skilled ST to maximize cognition, train caregivers in compensatory strategies to support memory in daily activities and use of external memory aids for safety and to reduce caregiver burden. ? ?OBJECTIVE IMPAIRMENTS include attention, memory, awareness, and executive functioning. These impairments are limiting patient from  making safe decisions and recalling pertinent information  such as family names . ?Factors affecting potential to achieve goals and functional outcome are medical prognosis, previous level of function, and severity of impairments.. Patient will benefit from skilled SLP

## 2022-02-04 ENCOUNTER — Ambulatory Visit: Payer: Medicare Other | Admitting: Physical Therapy

## 2022-02-04 ENCOUNTER — Encounter: Payer: Self-pay | Admitting: Neurology

## 2022-02-04 ENCOUNTER — Ambulatory Visit: Payer: Medicare Other | Admitting: Neurology

## 2022-02-04 ENCOUNTER — Ambulatory Visit: Payer: Medicare Other | Admitting: Speech Pathology

## 2022-02-04 ENCOUNTER — Encounter: Payer: Medicare Other | Admitting: Occupational Therapy

## 2022-02-04 VITALS — BP 125/73 | HR 101 | Ht 64.0 in | Wt 135.4 lb

## 2022-02-04 DIAGNOSIS — R2689 Other abnormalities of gait and mobility: Secondary | ICD-10-CM | POA: Diagnosis not present

## 2022-02-04 DIAGNOSIS — R413 Other amnesia: Secondary | ICD-10-CM | POA: Diagnosis not present

## 2022-02-04 DIAGNOSIS — R41841 Cognitive communication deficit: Secondary | ICD-10-CM

## 2022-02-04 DIAGNOSIS — M6281 Muscle weakness (generalized): Secondary | ICD-10-CM

## 2022-02-04 DIAGNOSIS — F01B0 Vascular dementia, moderate, without behavioral disturbance, psychotic disturbance, mood disturbance, and anxiety: Secondary | ICD-10-CM | POA: Diagnosis not present

## 2022-02-04 DIAGNOSIS — R2681 Unsteadiness on feet: Secondary | ICD-10-CM

## 2022-02-04 MED ORDER — DONEPEZIL HCL 5 MG PO TABS: 5.0000 mg | ORAL_TABLET | Freq: Every day | ORAL | 3 refills | Status: DC

## 2022-02-04 NOTE — Progress Notes (Signed)
?[facility] ?[id number] ?[place]. [place] [address] ?[phone] ? ?     OFFICE FOLLOW UP VISIT NOTE ? ?Ms. Adrian Prince ?Date of Birth:  October 04, 1936 ?Medical Record Number:  [id number]  ? ?Referring MD: Nada Maclachlan PA-C ? ?Reason for Referral: Stroke ? ?[id number] visit 08/05/2021: Ms. Schamp is a 86 year old pleasant Caucasian lady seen for initial office consultation visit for stroke.  She is accompanied by her daughter and husband.  History is obtained from them and review of electronic medical records and I personally reviewed pertinent available imaging films in PACS.  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  She presented on 06/19/2021 on postoperative day 1 for confusion, delirium and lethargy.  This was initially felt to be blood pressure and he was taken to the hospital by EMS.  He had an MRI scan of the brain personally reviewed.  Shows old left cerebellar infarct.  Continues to have peripheral vision loss, stroke improved.  Continues to walk with a walker, but no falls or injuries.  She is tolerating well without bruising or bleeding.  Blood pressure is under good control.  Tolerating Lipitor without muscle aches and.  .  She was also felt to have underlying low-grade dementia which was exacerbated by the surgery.  Her symptoms continued for couple of days and noncontrast CT scan of the head was obtained which showed acute left posterior cerebral artery stroke.  Her NIH was 4 on admission she is not a candidate for tPA due to recent surgery.  MRI scan was subsequently obtained which showed a left posterior cerebral artery infarct and MRA showed left P2 occlusion.  2D echo showed ejection fraction of 60 to 65% without cardiac source of embolism.  LDL cholesterol was elevated at 123 mg percent and  hemoglobin A1c was 5.5.  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  Patient continues to have right-sided peripheral vision loss.  She has been ambulating with a walker and has been careful.  She has had no falls or injuries.  The family has noticed worsening of her cognitive difficulties with patient having more frequent sundowning for which she has been started on Seroquel 25 mg at night which helps her sleep well.  There are no delusions or hallucinations during the day.  She has more short-term memory difficulties and intermittent confusion which is new.  She does have 24-hour care at home and cannot be left alone. ?Update 12/09/2021 : She returns for follow-up after last visit 4 months ago.  She is accompanied by her husband.  Patient continues to have short-term memory difficulties as well as at times difficulty finding her way around her own home.  She gets disoriented and confused off-and-on.  He has had recurrent bouts of UTIs which have been treated by her primary care physician is currently on Macrobid.  Se had an episode of severe dizziness on 12/04/2021 and was taken to the emergency room by EMS blood pressure was found to be significantly elevated.  An MRI scan of the brain was obtained which I personally reviewed did not show any acute abnormality and showed old left PCA infarct.  She continues  to ambulate with a walker and history of pulmonary no falls injuries.  She is tolerating Eliquis well without bleeding or bruising..  Lab work on 10/22/2021 showed LDL cholesterol to be near optimal at 73 hemoglobin A1c at 5.7. ?Update 02/04/2022 : She returns for follow-up after last visit 2 months ago.  She is accompanied by husband and states that she has good days and bad days.  She  was from gets disoriented and confused in time she does not recognize even her husband and family members.  She is often talking to people who are dead.  I had started the patient on Namenda which she stopped it a month ago as her husband did not notice any benefit.  She is he has also not noticed that she is any worse after stopping the medicine.  Patient is UTI seem to be much better since she has been started on Macrodantin 50 mg which she takes every other day.  She does get occasionally agitated and irritable but there is no violent behavior.  She does not wander off.  She does sleep quite well and eats well.  She has not tried Aricept in the past and is willing to try it.  On Mini-Mental status exam today she scored 16/30.  Clock drawing 3/4. ?ROS:   ?14 system review of systems is positive for memory loss, irritability, agitation, hallucinations, disorientation, confusion imbalance, walking difficulty, peripheral vision loss and all other systems negative ? ?PMH:  ?Past Medical History:  ?Diagnosis Date  ? Anxiety   ? Arthritis   ? Celiac sprue   ? CVA (cerebral vascular accident) ([facility]) 02/2018  ? R thalamic CVA  ? GERD (gastroesophageal reflux disease)   ? Glaucoma   ? Hormone replacement therapy (HRT)   ? Hyperlipidemia   ? Hypertension   ? Hypoglycemia   ? Hypothyroidism   ? OAB (overactive bladder)   ? Osteoporosis   ? PONV (postoperative nausea and vomiting)   ? Raynaud's disease   ? Scoliosis   ? Seasonal allergies   ? Senile purpura ([facility])   ? 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 Right Cheek  ? 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 Bridge Left Nose, and Left Neck  ? 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 Right Cheek Tx: curret x 3 and 5FU  ? 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 Bridge of nose tx Cx3 and 5FU, Left neck Tx Cx3 and 5FU  ? Thrombophlebitis   ? ? ?Social History:  ?Social History  ? ?Socioeconomic History  ? Marital status:  Married  ?  Spouse name: Blondell RevealWinslow  ? Number of children: 3  ? Years of education: 5912  ? Highest education level: Not on file  ?Occupational History  ? Occupation: retired  ?Tobacco Use  ? Smoking status: Never  ? Smokeless tobacco: Never  ?Vaping Use  ? Vaping Use: Never used  ?Substance and Sexual Activity  ? Alcohol use: No  ? Drug use: Not Currently  ? Sexual activity: Never  ?Other Topics Concern  ? Not on file  ?Social History Narrative  ? Lives with husband  ? Right Handed  ? Drinks no caffeine  ? ?Social Determinants of Health  ? ?Financial Resource Strain: Not on file  ?Food Insecurity: Not on file  ?Transportation Needs: Not on file  ?Physical Activity: Not on file  ?Stress: Not on file  ?Social Connections: Not on file  ?Intimate  Partner Violence: Not on file  ? ? ?Medications:   ?Current Outpatient Medications on File Prior to Visit  ?Medication Sig Dispense Refill  ? acetaminophen (TYLENOL) 325 MG tablet Take 2 tablets (650 mg total) by mouth every 6 (six) hours as needed for mild pain, moderate pain, fever or headache.    ? Ascorbic Acid (VITAMIN C) 1000 MG tablet Take 1,000 mg by mouth daily.    ? atorvastatin (LIPITOR) 80 MG tablet Take 1 tablet (80 mg total) by mouth at bedtime. 90 tablet 1  ? Catheters MISC Use as directed. 1 box of inserts and 1 box canisters. 1 each 3  ? Cholecalciferol (VITAMIN D-3) 25 MCG (1000 UT) CAPS Take 1 capsule (1,000 Units total) by mouth daily. 30 capsule 0  ? Coenzyme Q10 (COQ10 PO) Take 1 tablet by mouth daily.    ? ELIQUIS 5 MG TABS tablet TAKE 1 TABLET BY MOUTH 2 TIMES DAILY. 180 tablet 1  ? furosemide (LASIX) 20 MG tablet Take 1 tablet (20 mg total) by mouth daily as needed for edema. 90 tablet 0  ? irbesartan (AVAPRO) 75 MG tablet Take 1 tablet po QAM and take 1/2 tablet po Qpm 45 tablet 1  ? levothyroxine (SYNTHROID) 125 MCG tablet TAKE 1 TABLET (125 MCG TOTAL) BY MOUTH DAILY AT 6 AM. 90 tablet 1  ? meclizine (ANTIVERT) 25 MG tablet Take 0.5 tablets (12.5 mg  total) by mouth 3 (three) times daily as needed for dizziness. 30 tablet 0  ? memantine (NAMENDA) 10 MG tablet Take 1 tablet (10 mg total) by mouth 2 (two) times daily. 60 tablet 3  ? nitrofurantoin (MACRODANTIN) 50 MG

## 2022-02-04 NOTE — Patient Instructions (Signed)
I had a long discussion with the patient and her husband  regarding her  stroke and worsening memory and cognitive impairment which is likely due to combination of mild vascular dementia with underlying age-related cognitive impairment.  I recommend she try Aricept 5 mg daily with food for a month and increase if tolerated to 10 mg and she has not had much benefit with Namenda and has discontinued it.  We also discussed that she stay on Eliquis  given history of DVT for stroke prevention and maintain aggressive risk factor modification with strict control of hypertension with blood pressure goal below 130/90, lipids with LDL cholesterol goal below 70 mg percent and diabetes with hemoglobin A1c goal below 6.5%.  She was encouraged to use a walker at all times and discussed fall safety precautions.  We also discussed memory compensation strategies and advised her to increase participation in cognitively challenging activities like solving crossword puzzles, playing bridge and sodoku.  She will return for follow-up in the future in 3 months with my nurse practitioner on call earlier if necessary. ?

## 2022-02-04 NOTE — Therapy (Signed)
?OUTPATIENT PHYSICAL THERAPY TREATMENT NOTE ? ? ?Patient Name: Caroline [name] ?MRN: [id number] ?DOB:02-01-1936, 86 y.o., female ?Today's Date: 02/05/2022 ? ?PCP: Ronnell Freshwater, NP ?REFERRING PROVIDER:  Alysia Penna, MD ? ? PT End of Session - 02/05/22 1044   ? ? Visit Number 8   ? Number of Visits 17   plus eval  ? Date for PT Re-Evaluation 03/22/22   ? Authorization Type [employer]   ? Progress Note Due on Visit 10   ? PT Start Time 1318   ? PT Stop Time 1401   ? PT Time Calculation (min) 43 min   ? Equipment Utilized During Treatment Gait belt   ? Activity Tolerance Patient tolerated treatment well   ? Behavior During Therapy [facility] for tasks assessed/performed   ? ?  ?  ? ?  ? ? ? ?Past Medical History:  ?Diagnosis Date  ? Anxiety   ? Arthritis   ? Celiac sprue   ? CVA (cerebral vascular accident) ([place]) 02/2018  ? R thalamic CVA  ? GERD (gastroesophageal reflux disease)   ? Glaucoma   ? Hormone replacement therapy (HRT)   ? Hyperlipidemia   ? Hypertension   ? Hypoglycemia   ? Hypothyroidism   ? OAB (overactive bladder)   ? Osteoporosis   ? PONV (postoperative nausea and vomiting)   ? Raynaud's disease   ? Scoliosis   ? Seasonal allergies   ? Senile purpura ([place])   ? 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 Right Cheek  ? 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 Bridge Left Nose, and Left Neck  ? 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 Right Cheek Tx: curret x 3 and 5FU  ? 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 Bridge of nose tx Cx3 and 5FU, Left neck Tx Cx3 and 5FU  ? Thrombophlebitis   ? ?Past Surgical History:  ?Procedure Laterality Date  ? ABDOMINAL HYSTERECTOMY  1979  ? CATARACT EXTRACTION, BILATERAL    ? knee surgery Left   ? VARICOSE VEIN SURGERY Left   ? XI ROBOTIC ASSISTED PARAESOPHAGEAL HERNIA REPAIR N/A 06/15/2021  ? Procedure: XI ROBOTIC ASSISTED PARAESOPHAGEAL HERNIA REPAIR, GASTROPEXY, UPPER ENDOSCOPY;  Surgeon: Clovis Riley,  MD;  Location: [facility];  Service: General;  Laterality: N/A;  ? ?Patient Active Problem List  ? Diagnosis Date Noted  ? Chronic pain of both knees 01/24/2022  ? Urinary incontinence 11/29/2021  ? Multiple pulmonary nodules 11/29/2021  ? Hypertrophic toenail 11/29/2021  ? Confusion 10/25/2021  ? Vitamin B12 deficiency 10/25/2021  ? DVT (deep venous thrombosis) ([place]) 07/30/2021  ? Dysphagia, post-stroke   ? Hypoalbuminemia due to protein-calorie malnutrition ([place])   ? Benign essential HTN   ? Dyslipidemia   ? Delirium due to multiple etiologies 06/17/2021  ? S/P repair of paraesophageal hernia 06/15/2021  ? CVA (cerebral vascular accident) ([place]) 02/09/2018  ? Hypothyroidism 02/08/2018  ? Essential hypertension 02/08/2018  ? Acute ischemic stroke ([place]) 02/08/2018  ? Chronic urinary tract infection 02/08/2018  ? Acute cystitis without hematuria   ? Right thalamic stroke [facility])   ? Hyperlipidemia   ? ? 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 ?THERAPY DIAG:  ?Other abnormalities of gait and mobility ? ?Muscle weakness (generalized) ? ?Unsteadiness on feet ? ?PERTINENT HISTORY: Left PCA distribution infarct with right homonymous hemianopsia right neglect and cognitive deficits; ?  ? ?  PRECAUTIONS: Falls ? ?SUBJECTIVE:   Husband reports pt has appt with orthopedic MD (Dr. Sharol Given) on Monday for chronic knee pain; pt does not report knee pain at start of session in seated position  ? ?PAIN:  ?Are you having pain? No ? ? ?TODAY'S TREATMENT:  ? ?TherEx: ? ?Sit to stand transfer from mat x 5 reps with minimal UE support with CGA for safety; verbal cues to attempt to stabilize without bracing back of legs against mat table ?SciFit on Level 2.5 with bil. UE's and LE's x 5" ? ?GAIT: ?Gait pattern: step through pattern, decreased step length- Right, decreased step length- Left, and trunk flexed ?Distance walked: 58' ; pt then ambulated 115' carrying plastic square milk  crate without any additional weight inside for improved multi tasking with gait - cues given for direction due to Rt visual deficits ?Assistive device utilized: CGA ?Level of assistance: Min A ? ?Step training; pt negotiated 4 steps using Lt hand rail with ascension and Lt handrail with descension using step over step sequence with ascension and step by step sequence with descension with CGA  ? ? ?NMR:  ?Pt performed dynamic standing balance activity - 5 bean bags placed behind her on Rt/Lt side of her on mat; pt turned and reached outside BOS with CGA; needed cues to locate bean bag on her Rt side; pt then turned and tossed bean bag into basket approx. 3' in front of her  ? ?Standing at counter - with LUE support with CGA to min assist;  pt touched 3 balance bubbles, in order of colors named, various sequences, with RLE and with LLE for improved SLS;  pt fearful of falling so she did not perform without UE support on counter ? ?Home Program: ?Access Code: [email or web address] ?  ?  ?  ?GOALS: ?Goals reviewed with patient? Yes ?  ?SHORT TERM GOALS: Target date: 01/25/2022 ?  ?Pt will perform initial HEP w/min A from husband for improved strength, balance, transfers and gait. ?  ?Baseline: reports independence with current HEP with husband assitance ?Goal status: MET ?  ?2.  Pt will improve Berg Balance to >/= 30/56 to demonstrate improved standing balance and reduced fall risk ?Baseline: 26/56; 34/56 ?Goal status: MET ?  ?3.  Update goal when 5x STS complete and write LTG as appropriate  ?Baseline: 32.38 seconds ?Goal status: MET ?  ?4.  Pt and caregiver will report 4 or fewer falls since eval for reduced fall frequency and improved safety awareness at home ?Baseline: no falls per husband reports ?Goal status: MET ?  ?5.  Pt will trial use of posterior walker for improved balance and safety with functional mobility  ?Baseline: currently using RW; not safe option at this time.  ?Goal status: deferred ?  ?  ?LONG TERM GOALS:  Target date: 02/22/2022 ?  ?Pt will perform final HEP w/min A from husband for improved strength, balance, transfers and gait. ?  ?Baseline:  ?Goal status: INITIAL ?  ?2.  Pt will improve gait velocity to at least 1.9 ft/s with LRAD for improved gait efficiency and performance at community ambulator level without recurrent fall risk ?  ?Baseline: 1.46 ft/s with RW ?Goal status: INITIAL ?  ?3.  Pt will improve Berg Balance to >/= 35/56 to demonstrate improved standing balance and reduced fall risk ?Baseline: 26/56 ?Goal status: INITIAL ?  ?4. Pt will improve 5x STS to </= 25 sec to demo improved functional LE strength and balance  ?Baseline: 32.38 seconds  ?  Goal status: INITIAL ?  ?5.  Pt and caregiver will teach back techniques for fall prevention in the home and proper fall recovery for improved safety  ?Baseline:  ?Goal status: INITIAL ?  ?5.  Pt will improve FOTO to 62% or greater for improved functional mobility  ?Baseline: 57% ?Goal status: INITIAL ?  ?  ?ASSESSMENT: ?  ?CLINICAL IMPRESSION: ?PT session focused on gait training without use of assistive device with addition of carrying an object for first time to improve balance and multi-tasking with gait.  Pt remains fearful of falling.  Pt continues to need cues to attend to Rt side due to visual deficits.  Pt confabulating in today's session with pt reporting she fell onto floor at home and had to call her grandson to pick her up off floor; husband reports this did not happen and states pt often gets confused.  Cont with POC.  ?  ?OBJECTIVE IMPAIRMENTS Abnormal gait, decreased activity tolerance, decreased balance, decreased cognition, decreased coordination, decreased endurance, decreased knowledge of condition, decreased knowledge of use of DME, decreased mobility, difficulty walking, decreased safety awareness, dizziness, impaired perceived functional ability, impaired vision/preception, and postural dysfunction.  ?  ?ACTIVITY LIMITATIONS cleaning,  driving, meal prep, laundry, and medication management.  ?  ?PERSONAL FACTORS Age, Fitness, Time since onset of injury/illness/exacerbation, and 1-2 comorbidities: R homonymous hemianopsia and impaired memory safety

## 2022-02-05 ENCOUNTER — Encounter: Payer: Self-pay | Admitting: Physical Therapy

## 2022-02-08 ENCOUNTER — Ambulatory Visit: Payer: Medicare Other | Admitting: Physical Therapy

## 2022-02-08 ENCOUNTER — Encounter: Payer: Medicare Other | Admitting: Occupational Therapy

## 2022-02-08 ENCOUNTER — Ambulatory Visit: Payer: Medicare Other | Admitting: Orthopedic Surgery

## 2022-02-08 ENCOUNTER — Ambulatory Visit: Payer: Medicare Other | Admitting: Speech Pathology

## 2022-02-08 ENCOUNTER — Ambulatory Visit: Payer: Self-pay

## 2022-02-08 ENCOUNTER — Ambulatory Visit (INDEPENDENT_AMBULATORY_CARE_PROVIDER_SITE_OTHER): Payer: Medicare Other

## 2022-02-08 ENCOUNTER — Encounter: Payer: Self-pay | Admitting: Orthopedic Surgery

## 2022-02-08 DIAGNOSIS — M25561 Pain in right knee: Secondary | ICD-10-CM

## 2022-02-08 DIAGNOSIS — G8929 Other chronic pain: Secondary | ICD-10-CM

## 2022-02-08 DIAGNOSIS — M25562 Pain in left knee: Secondary | ICD-10-CM | POA: Diagnosis not present

## 2022-02-08 DIAGNOSIS — M25569 Pain in unspecified knee: Secondary | ICD-10-CM

## 2022-02-08 NOTE — Progress Notes (Signed)
? ?Office Visit Note ?  ?Patient: Caroline [name]           ?Date of Birth: 13-Nov-1935           ?MRN: [id number] ?Visit Date: 02/08/2022 ?             ?Requested by: Carlean Jews, NP ?[phone] ?[address] ?[place],  [place] [address] ?PCP: Carlean Jews, NP ? ?Chief Complaint  ?Patient presents with  ? Left Knee - Pain  ? Right Knee - Pain  ? ? ? ? ?HPI: ?Patient is an 86 year old woman who is seen for evaluation for bilateral knee pain worse on the right patella.  Patient states she has start up stiffness giving way and has for falls.  She states she has fallen directly on the right knee.  Patient is status post a stroke in September last year is using a walker and currently going to physical therapy. ? ?Assessment & Plan: ?Visit Diagnoses:  ?1. Knee pain, unspecified chronicity, unspecified laterality   ?2. Chronic pain of both knees   ? ? ?Plan: Recommended continuing therapy for strengthening.  The knee pain should resolve as her knees get stronger.  Continue with the walker. ? ?Follow-Up Instructions: Return if symptoms worsen or fail to improve.  ? ?Ortho Exam ? ?Patient is alert, oriented, no adenopathy, well-dressed, normal affect, normal respiratory effort. ?Examination patient ambulates with a significant kyphosis uses a walker and a assist belt. ? ?Examination of both knees there is no effusion of either knee.  She has a scar on the left knee which appears consistent with a repair of the quad tendon remotely.  There is no evidence of tunnels or retained hardware in the patella on the left.  No joint space irregularity.  Patient has tenderness to palpation over the patella on the right she has full active extension of both knees full active flexion and active dorsiflexion of plantarflexion of both ankles. ? ?Imaging: ?XR Knee 1-2 Views Right ? ?Result Date: 02/08/2022 ?2 view radiographs of the right knee shows calcification of the popliteal artery.  There is no joint space narrowing.  No osteophytic bone  spurs or cysts.  ?No images are attached to the encounter. ? ?Labs: ?Lab Results  ?Component Value Date  ? HGBA1C 5.7 (H) 10/22/2021  ? HGBA1C 5.5 06/19/2021  ? HGBA1C 5.7 (H) 11/24/2020  ? ESRSEDRATE 7 10/21/2021  ? REPTSTATUS 07/05/2021 FINAL 07/04/2021  ? CULT  07/04/2021  ?  NO GROWTH ?Performed at [facility], [address], [place], [place] [address] ?  ? LABORGA ESCHERICHIA COLI (A) 06/22/2021  ? ? ? ?Lab Results  ?Component Value Date  ? ALBUMIN 4.8 (H) 10/22/2021  ? ALBUMIN 3.6 07/29/2021  ? ALBUMIN 3.4 (L) 07/04/2021  ? ? ?Lab Results  ?Component Value Date  ? MG 1.9 07/07/2021  ? MG 2.2 06/23/2021  ? MG 2.2 06/16/2021  ? ?No results found for: VD25OH ? ?No results found for: PREALBUMIN ? ?  Latest Ref Rng & Units 12/04/2021  ? 10:34 AM 10/21/2021  ?  2:28 PM 07/29/2021  ?  9:59 AM  ?CBC EXTENDED  ?WBC 4.0 - 10.5 K/uL 7.0   6.5   8.6    ?RBC 3.87 - 5.11 MIL/uL 4.70   4.55   4.08    ?Hemoglobin 12.0 - 15.0 g/dL 62.1   30.8   65.7    ?HCT 36.0 - 46.0 % 42.2   39.9   35.2    ?  Platelets 150 - 400 K/uL 322   384   415    ?NEUT# 1.7 - 7.7 K/uL 4.4    6.1    ?Lymph# 0.7 - 4.0 K/uL 1.8    1.4    ? ? ? ?There is no height or weight on file to calculate BMI. ? ?Orders:  ?Orders Placed This Encounter  ?Procedures  ? XR Knee 1-2 Views Left  ? XR Knee 1-2 Views Right  ? ?No orders of the defined types were placed in this encounter. ? ? ? Procedures: ?No procedures performed ? ?Clinical Data: ?No additional findings. ? ?ROS: ? ?All other systems negative, except as noted in the HPI. ?Review of Systems ? ?Objective: ?Vital Signs: There were no vitals taken for this visit. ? ?Specialty Comments:  ?No specialty comments available. ? ?PMFS History: ?Patient Active Problem List  ? Diagnosis Date Noted  ? Chronic pain of both knees 01/24/2022  ? Urinary incontinence 11/29/2021  ? Multiple pulmonary nodules 11/29/2021  ? Hypertrophic toenail 11/29/2021  ? Confusion 10/25/2021  ? Vitamin B12 deficiency 10/25/2021  ? DVT  (deep venous thrombosis) ([facility]) 07/30/2021  ? Dysphagia, post-stroke   ? Hypoalbuminemia due to protein-calorie malnutrition ([facility])   ? Benign essential HTN   ? Dyslipidemia   ? Delirium due to multiple etiologies 06/17/2021  ? S/P repair of paraesophageal hernia 06/15/2021  ? CVA (cerebral vascular accident) ([facility]) 02/09/2018  ? Hypothyroidism 02/08/2018  ? Essential hypertension 02/08/2018  ? Acute ischemic stroke ([facility]) 02/08/2018  ? Chronic urinary tract infection 02/08/2018  ? Acute cystitis without hematuria   ? Right thalamic stroke [facility]CC)   ? Hyperlipidemia   ? ?Past Medical History:  ?Diagnosis Date  ? Anxiety   ? Arthritis   ? Celiac sprue   ? CVA (cerebral vascular accident) ([facility]) 02/2018  ? R thalamic CVA  ? GERD (gastroesophageal reflux disease)   ? Glaucoma   ? Hormone replacement therapy (HRT)   ? Hyperlipidemia   ? Hypertension   ? Hypoglycemia   ? Hypothyroidism   ? OAB (overactive bladder)   ? Osteoporosis   ? PONV (postoperative nausea and vomiting)   ? Raynaud's disease   ? Scoliosis   ? Seasonal allergies   ? Senile purpura ([facility])   ? 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 Right Cheek  ? 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 Bridge Left Nose, and Left Neck  ? 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 Right Cheek Tx: curret x 3 and 5FU  ? 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 Bridge of nose tx Cx3 and 5FU, Left neck Tx Cx3 and 5FU  ? Thrombophlebitis   ?  ?Family History  ?Problem Relation Age of Onset  ? Arthritis Mother   ?     pt denies  ? Hypertension Mother   ?     pt denies  ? Obesity Sister   ? Cancer Maternal Uncle   ?     type unknown  ? Colon cancer Neg Hx   ? Stomach cancer Neg Hx   ? Esophageal cancer Neg Hx   ? Pancreatic cancer Neg Hx   ?  ?Past Surgical History:  ?Procedure Laterality Date  ? ABDOMINAL HYSTERECTOMY  1979  ? CATARACT EXTRACTION, BILATERAL    ? knee surgery Left   ? VARICOSE VEIN SURGERY Left   ? XI ROBOTIC  ASSISTED PARAESOPHAGEAL HERNIA REPAIR N/A 06/15/2021  ?  Procedure: XI ROBOTIC ASSISTED PARAESOPHAGEAL HERNIA REPAIR, GASTROPEXY, UPPER ENDOSCOPY;  Surgeon: Berna Bue, MD;  Location: [facility];  Service: General;  Laterality: N/A;  ? ?Social History  ? ?Occupational History  ? Occupation: retired  ?Tobacco Use  ? Smoking status: Never  ? Smokeless tobacco: Never  ?Vaping Use  ? Vaping Use: Never used  ?Substance and Sexual Activity  ? Alcohol use: No  ? Drug use: Not Currently  ? Sexual activity: Never  ? ? ? ? ? ?

## 2022-02-10 ENCOUNTER — Telehealth: Payer: Self-pay

## 2022-02-10 MED ORDER — DONEPEZIL HCL 10 MG PO TABS: 10.0000 mg | ORAL_TABLET | Freq: Every day | ORAL | 6 refills | Status: DC

## 2022-02-10 NOTE — Therapy (Deleted)
OUTPATIENT SPEECH LANGUAGE PATHOLOGY TREATMENT NOTE   Patient Name: Caroline [name] MRN: [id number] DOB:1936/04/01, 86 y.o., female Today's Date: 02/10/2022  PCP: Caroline Freshwater, NP REFERRING PROVIDER: Charlett Blake, MD   END OF SESSION:       Past Medical History:  Diagnosis Date   Anxiety    Arthritis    Celiac sprue    CVA (cerebral vascular accident) ([place]) 02/2018   R thalamic CVA   GERD (gastroesophageal reflux disease)    Glaucoma    Hormone replacement therapy (HRT)    Hyperlipidemia    Hypertension    Hypoglycemia    Hypothyroidism    OAB (overactive bladder)    Osteoporosis    PONV (postoperative nausea and vomiting)    Raynaud's disease    Scoliosis    Seasonal allergies    Senile purpura ([place])    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 Cheek   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   Bridge Left Nose, and Left Neck   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 Cheek Tx: curret x 3 and 5FU   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   Bridge of nose tx Cx3 and 5FU, Left neck Tx Cx3 and 5FU   Thrombophlebitis    Past Surgical History:  Procedure Laterality Date   ABDOMINAL HYSTERECTOMY  1979   CATARACT EXTRACTION, BILATERAL     knee surgery Left    VARICOSE VEIN SURGERY Left    XI ROBOTIC ASSISTED PARAESOPHAGEAL HERNIA REPAIR N/A 06/15/2021   Procedure: XI ROBOTIC ASSISTED PARAESOPHAGEAL HERNIA REPAIR, GASTROPEXY, UPPER ENDOSCOPY;  Surgeon: Caroline Riley, MD;  Location: [facility];  Service: General;  Laterality: N/A;   Patient Active Problem List   Diagnosis Date Noted   Chronic pain of both knees 01/24/2022   Urinary incontinence 11/29/2021   Multiple pulmonary nodules 11/29/2021   Hypertrophic toenail 11/29/2021   Confusion 10/25/2021   Vitamin B12 deficiency 10/25/2021   DVT (deep venous thrombosis) ([place]) 07/30/2021   Dysphagia, post-stroke    Hypoalbuminemia due to protein-calorie  malnutrition ([facility])    Benign essential HTN    Dyslipidemia    Delirium due to multiple etiologies 06/17/2021   S/P repair of paraesophageal hernia 06/15/2021   CVA (cerebral vascular accident) ([place]) 02/09/2018   Hypothyroidism 02/08/2018   Essential hypertension 02/08/2018   Acute ischemic stroke ([place]) 02/08/2018   Chronic urinary tract infection 02/08/2018   Acute cystitis without hematuria    Right thalamic stroke ([place])    Hyperlipidemia     ONSET DATE: 06/15/2021  REFERRING DIAG:  Q22.297 (ICD-10-CM) - 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 No   OBJECTIVE:   TODAY'S TREATMENT: SKILLED INTERVENTIONS: 02-11-22:   02-03-22: Pt presents with some fatigue but overall pleasant mood. Limited orientation, telling ST she was at dentist when she came from doctors. Husband reports they will be trying new medication and he has additional caregivers coming x2 days/week to interact with Caroline [name]. Provided recommendations on activities they can do with Caroline [name] and some modifications to consider. Playing cards with appropriate modifications, planting flowers, playdough, looking at photo albums, sharing magazines and discussing, puzzles, folding laundry. Encouragement to continue targeting R visual scanning during meal times, in using chair, referencing preferred reading materials (magazines), doing activities. Memory book to arrive soon, will plan to  bring to next session.    5-1-23[name] and her spouse enter with frustration. She is wandering the house without her walker, has increasing confusion about who has died.She is having loose stools in the am. During this session, she is confused about who lives in her house, stating her daughters live at home still and she leaves the door open for them in case they forget their key. She expects her daughter who passed to be at home in her  bed. She asks her husband where her house is when she is at home. She is becoming somewhat belligerent. Today, we reviewed where her seat control is with cues to reach further down. Memory book should be arriving this week. Educated spouse that if confronting Caroline [name] that her relatives are dead results in her distress to use empathic response, such as "I know you miss Caroline [name]." Then distract or redirect her. Encouraged him to get more help in the house. He agrees to increase home aids to 4x a week. Educated that we can provide modified cognitive activities for aids to do with her. Trained them in modifying card sort activity so Caroline [name] has success and as a tool to redirect her when she is upset or perseverating on where her home is.    PATIENT EDUCATION: Education details: see above Person educated: Patient and Spouse Education method: Explanation and Verbal cues Education comprehension: verbal cues required and needs further education     GOALS: Goals reviewed with patient? Yes   SHORT TERM GOALS: Target date: 01/25/2022   Caregivers will use 2 external aids for pt to find chair controls on the right side of her chair with 50% less cueing from spouse, subjectively Baseline: 01/19/22, 01/21/2022 Goal status: GOAL PARTIALLY MET   2.  Caregivers will carryover 2 external aids to reduce pt from getting up with out asking for help 50% less subjectively by spouse Goal status: GOAL NOT MET   3.  Pt and Caregivers will use memory book for pt to recall 8 family names with usual min A   Baseline: 01/19/22, 01/21/2022 Goal status: GOAL PARTIALLY MET         LONG TERM GOALS: Target date: 03/22/2022   Caregivers will use external aids for memory in 2 additional situations where safety for frustration are a concern  Goal status: ONGOING   2.  Caregivers and pt will use memory book to recall 2 pertinent facts about 5 family members with usual min A Goal status: ONGOING   3.  Caregivers/pt will use external  memory aids or memory book to recall personally relevant places, names, trips etc over 4 sessions Goal status: ONGOING   4.  Caregivers/pt will use external aids to recall that she is home, with 50% reduction in confusion /frustration about this reported subjectively by spouse Goal status: ONGOING   5.  Pt will attend to the right side of her memory book with external aids and verbal cues to locate information 3/5x with occasional min A  Goal status: ONGOING   ASSESSMENT:   CLINICAL IMPRESSION: Caroline [name]  presents with impaired memory, orientation, attention, executive functioning. She had MCI dx since 2019. Significant cognitive impairment, impacting pt's ability to recall personally relevant information. Pt is currently requiring full time care. Caroline [name] is managing all finances, cooking, shopping and meds. He has care attendants a couple days a week who help with cleaning and bathing. Caroline [name] verbalizes poor awareness of her cognitive impairment. She insists her mother and other dead  relatives are alive, asks where her home is when she is home, wanders without her walker despite her spouse asking her to remain seated. There is frustration from both of them. ? Dementia vs AD, instructed them to see Dr. Leonie Man about cognitive changes.. I recommend skilled ST to maximize cognition, train caregivers in compensatory strategies to support memory in daily activities and use of external memory aids for safety and to reduce caregiver burden.  OBJECTIVE IMPAIRMENTS include attention, memory, awareness, and executive functioning. These impairments are limiting patient from  making safe decisions and recalling pertinent information such as family names . Factors affecting potential to achieve goals and functional outcome are medical prognosis, previous level of function, and severity of impairments.. Patient will benefit from skilled SLP services to address above impairments and improve overall function.   REHAB  POTENTIAL: Fair     PLAN: SLP FREQUENCY: 2x/week   SLP DURATION: 12 weeks   PLANNED INTERVENTIONS: Language facilitation, Environmental controls, Cueing hierachy, Cognitive reorganization, Internal/external aids, Functional tasks, Multimodal communication approach, SLP instruction and feedback, and Compensatory strategies  Su Monks, [place]  02/10/2022, 4:20 PM

## 2022-02-10 NOTE — Telephone Encounter (Signed)
Ins should cover 10 mg tab daily instead of 2 tabs 5mg . ?

## 2022-02-11 ENCOUNTER — Ambulatory Visit: Payer: Medicare Other | Admitting: Physical Therapy

## 2022-02-11 ENCOUNTER — Encounter: Payer: Medicare Other | Admitting: Occupational Therapy

## 2022-02-11 ENCOUNTER — Ambulatory Visit: Payer: Medicare Other | Admitting: Speech Pathology

## 2022-02-11 DIAGNOSIS — R41841 Cognitive communication deficit: Secondary | ICD-10-CM

## 2022-02-15 ENCOUNTER — Ambulatory Visit: Payer: Medicare Other | Admitting: Speech Pathology

## 2022-02-15 ENCOUNTER — Ambulatory Visit: Payer: Medicare Other | Admitting: Physical Therapy

## 2022-02-15 ENCOUNTER — Encounter: Payer: Medicare Other | Admitting: Occupational Therapy

## 2022-02-15 ENCOUNTER — Encounter: Payer: Self-pay | Admitting: Speech Pathology

## 2022-02-15 DIAGNOSIS — R2689 Other abnormalities of gait and mobility: Secondary | ICD-10-CM | POA: Diagnosis not present

## 2022-02-15 DIAGNOSIS — R2681 Unsteadiness on feet: Secondary | ICD-10-CM

## 2022-02-15 DIAGNOSIS — M6281 Muscle weakness (generalized): Secondary | ICD-10-CM

## 2022-02-15 DIAGNOSIS — R41841 Cognitive communication deficit: Secondary | ICD-10-CM

## 2022-02-15 NOTE — Therapy (Signed)
?OUTPATIENT SPEECH LANGUAGE PATHOLOGY TREATMENT NOTE ? ? ?Patient Name: Caroline [name] ?MRN: [id number] ?DOB:[birth year], 86 y.o., female ?Today's Date: 02/15/2022 ? ?PCP: Ronnell Freshwater, NP ?REFERRING PROVIDER: Charlett Blake, MD  ? ?END OF SESSION:  ? End of Session - 02/15/22 1104   ? ? Visit Number 9   ? Number of Visits 25   ? Date for SLP Re-Evaluation 03/22/22   ? SLP Start Time 1103   ? SLP Stop Time  1145   ? SLP Time Calculation (min) 42 min   ? Activity Tolerance Patient tolerated treatment well   ? ?  ?  ? ?  ? ? ? ? ?Past Medical History:  ?Diagnosis Date  ? Anxiety   ? Arthritis   ? Celiac sprue   ? CVA (cerebral vascular accident) ([place]) 02/2018  ? R thalamic CVA  ? GERD (gastroesophageal reflux disease)   ? Glaucoma   ? Hormone replacement therapy (HRT)   ? Hyperlipidemia   ? Hypertension   ? Hypoglycemia   ? Hypothyroidism   ? OAB (overactive bladder)   ? Osteoporosis   ? PONV (postoperative nausea and vomiting)   ? Raynaud's disease   ? Scoliosis   ? Seasonal allergies   ? Senile purpura ([place])   ? 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 Right Cheek  ? 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 Bridge Left Nose, and Left Neck  ? 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 Right Cheek Tx: curret x 3 and 5FU  ? 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 Bridge of nose tx Cx3 and 5FU, Left neck Tx Cx3 and 5FU  ? Thrombophlebitis   ? ?Past Surgical History:  ?Procedure Laterality Date  ? ABDOMINAL HYSTERECTOMY  1979  ? CATARACT EXTRACTION, BILATERAL    ? knee surgery Left   ? VARICOSE VEIN SURGERY Left   ? XI ROBOTIC ASSISTED PARAESOPHAGEAL HERNIA REPAIR N/A 06/15/2021  ? Procedure: XI ROBOTIC ASSISTED PARAESOPHAGEAL HERNIA REPAIR, GASTROPEXY, UPPER ENDOSCOPY;  Surgeon: Clovis Riley, MD;  Location: [facility];  Service: General;  Laterality: N/A;  ? ?Patient Active Problem List  ? Diagnosis Date Noted  ? Chronic pain of both knees 01/24/2022  ? Urinary  incontinence 11/29/2021  ? Multiple pulmonary nodules 11/29/2021  ? Hypertrophic toenail 11/29/2021  ? Confusion 10/25/2021  ? Vitamin B12 deficiency 10/25/2021  ? DVT (deep venous thrombosis) ([place]) 07/30/2021  ? Dysphagia, post-stroke   ? Hypoalbuminemia due to protein-calorie malnutrition ([place])   ? Benign essential HTN   ? Dyslipidemia   ? Delirium due to multiple etiologies 06/17/2021  ? S/P repair of paraesophageal hernia 06/15/2021  ? CVA (cerebral vascular accident) ([place]) 02/09/2018  ? Hypothyroidism 02/08/2018  ? Essential hypertension 02/08/2018  ? Acute ischemic stroke ([place]) 02/08/2018  ? Chronic urinary tract infection 02/08/2018  ? Acute cystitis without hematuria   ? Right thalamic stroke [facility])   ? Hyperlipidemia   ? ? ?ONSET DATE: 06/15/2021 ? 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 ?THERAPY DIAG:  ?Cognitive communication deficit ? ?SUBJECTIVE: "This is the memory book of useless information" ? ?PAIN:  ?Are you having pain? No  ? ?:  ? ?TODAY'S TREATMENT: ?SKILLED INTERVENTIONS: ? ? ?02-15-22: [name] and Mathew brought in memory book. With usual mod A to read the names and relationships, Constancia ID'd family members. I added their location and  occupations. She required cues to use her fingers to help locate each word and each person in the photos due to right visual cut. Jeannelle continues to insist her mother is still alive. Lubertha Basque continues to correct her, leading to agitation. Discussed with him accepting her idea that her mom is alive, however Lubertha Basque elects to correct her. She is getting lost in the house and using the half bath that does not have handles. Lubertha Basque uses a Stage manager to block the way to this bathroom. We generated strategy of placing a stop sign on the basket and on the bathroom door, as well as a sign with an arrow to the bathroom she is supposed to use. Lubertha Basque is aware that he may have to modify the height of the sign  to where Kenzly looks down when using her walker. She will need repetitive cues initially to follow the signs.  ? ? ?02-03-22: Pt presents with some fatigue but overall pleasant mood. Limited orientation, telling ST she was at dentist when she came from doctors. Husband reports they will be trying new medication and he has additional caregivers coming x2 days/week to interact with Jeani Hawking. Provided recommendations on activities they can do with Jeani Hawking and some modifications to consider. Playing cards with appropriate modifications, planting flowers, playdough, looking at photo albums, sharing magazines and discussing, puzzles, folding laundry. Encouragement to continue targeting R visual scanning during meal times, in using chair, referencing preferred reading materials (magazines), doing activities. Memory book to arrive soon, will plan to bring to next session.  ? ? ?02-01-22:  Kaelee and her spouse enter with frustration. She is wandering the house without her walker, has increasing confusion about who has died.She is having loose stools in the am. During this session, she is confused about who lives in her house, stating her daughters live at home still and she leaves the door open for them in case they forget their key. She expects her daughter who passed to be at home in her bed. She asks her husband where her house is when she is at home. She is becoming somewhat belligerent. Today, we reviewed where her seat control is with cues to reach further down. Memory book should be arriving this week. Educated spouse that if confronting Blimi that her relatives are dead results in her distress to use empathic response, such as "I know you miss Abigail Butts." Then distract or redirect her. Encouraged him to get more help in the house. He agrees to increase home aids to 4x a week. Educated that we can provide modified cognitive activities for aids to do with her. Trained them in modifying card sort activity so Greyson has success and as a  tool to redirect her when she is upset or perseverating on where her home is.  ? ? ?PATIENT EDUCATION: ?Education details: see above ?Person educated: Patient and Spouse ?Education method: Explanation and Verbal cues ?Education comprehension: verbal cues required and needs further education ?  ?  ?GOALS: ?Goals reviewed with patient? Yes ?  ?SHORT TERM GOALS: Target date: 01/25/2022 ?  ?Caregivers will use 2 external aids for pt to find chair controls on the right side of her chair with 50% less cueing from spouse, subjectively ?Baseline: 01/19/22, 01/21/2022 ?Goal status: GOAL PARTIALLY MET ?  ?2.  Caregivers will carryover 2 external aids to reduce pt from getting up with out asking for help 50% less subjectively by spouse ?Goal status: GOAL NOT MET ?  ?3.  Pt and Caregivers will use  memory book for pt to recall 8 family names with usual min A  ? Baseline: 01/19/22, 01/21/2022 ?Goal status: GOAL PARTIALLY MET ?  ?  ?  ?  ?LONG TERM GOALS: Target date: 03/22/2022 ?  ?Caregivers will use external aids for memory in 2 additional situations where safety for frustration are a concern ? Goal status: ONGOING ?  ?2.  Caregivers and pt will use memory book to recall 2 pertinent facts about 5 family members with usual min A ?Goal status: ONGOING ?  ?3.  Caregivers/pt will use external memory aids or memory book to recall personally relevant places, names, trips etc over 4 sessions ?Goal status: ONGOING ?  ?4.  Caregivers/pt will use external aids to recall that she is home, with 50% reduction in confusion /frustration about this reported subjectively by spouse ?Goal status: ONGOING ?  ?5.  Pt will attend to the right side of her memory book with external aids and verbal cues to locate information 3/5x with occasional min A ? Goal status: ONGOING ?  ?ASSESSMENT: ?  ?CLINICAL IMPRESSION: ?Damian Hofstra  presents with impaired memory, orientation, attention, executive functioning. She had MCI dx since 2019. Significant cognitive  impairment, impacting pt's ability to recall personally relevant information. Pt is currently requiring full time care. Lubertha Basque is managing all finances, cooking, shopping and meds. He has care attendants a couple

## 2022-02-15 NOTE — Patient Instructions (Signed)
? ?  Use your fingers to point to each person in the folder to help you keep  ? ?The home aids can go through the book with her  ? ?Have the aids help cue Caroline [name] for the bathroom and to use the signs ? ?Label the rooms she need to find "kitchen" ? ?Or put a sign with  "BATHROOM" with an  arrow towards the bathroom she is to use ? ?Put a stop sign on the basket and  bathroom she is not to use and keep the door closed ? ?Put the signs at her level - you may have to play with the right height. ? ?She will need repetition about looking for the signs  ? ?Have magazines, photo albums, games etc out for them to do ? ? ?

## 2022-02-15 NOTE — Therapy (Signed)
?OUTPATIENT PHYSICAL THERAPY TREATMENT NOTE ? ? ?Patient Name: Caroline [name] ?MRN: [id number] ?DOB:1935/10/18, 86 y.o., female ?Today's Date: 02/15/2022 ? ?PCP: Ronnell Freshwater, NP ?REFERRING PROVIDER:  Alysia Penna, MD ? ? PT End of Session - 02/15/22 1158   ? ? Visit Number 9   ? Number of Visits 17   ? Date for PT Re-Evaluation 03/22/22   ? Authorization Type [employer]   ? Progress Note Due on Visit 10   ? PT Start Time 1152   ? PT Stop Time 1230   ? PT Time Calculation (min) 38 min   ? Equipment Utilized During Treatment Gait belt   ? Activity Tolerance Patient tolerated treatment well   ? Behavior During Therapy [facility] for tasks assessed/performed   ? ?  ?  ? ?  ? ? ? ? ?Past Medical History:  ?Diagnosis Date  ? Anxiety   ? Arthritis   ? Celiac sprue   ? CVA (cerebral vascular accident) ([place]) 02/2018  ? R thalamic CVA  ? GERD (gastroesophageal reflux disease)   ? Glaucoma   ? Hormone replacement therapy (HRT)   ? Hyperlipidemia   ? Hypertension   ? Hypoglycemia   ? Hypothyroidism   ? OAB (overactive bladder)   ? Osteoporosis   ? PONV (postoperative nausea and vomiting)   ? Raynaud's disease   ? Scoliosis   ? Seasonal allergies   ? Senile purpura ([place])   ? 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 Right Cheek  ? 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 Bridge Left Nose, and Left Neck  ? 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 Right Cheek Tx: curret x 3 and 5FU  ? 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 Bridge of nose tx Cx3 and 5FU, Left neck Tx Cx3 and 5FU  ? Thrombophlebitis   ? ?Past Surgical History:  ?Procedure Laterality Date  ? ABDOMINAL HYSTERECTOMY  1979  ? CATARACT EXTRACTION, BILATERAL    ? knee surgery Left   ? VARICOSE VEIN SURGERY Left   ? XI ROBOTIC ASSISTED PARAESOPHAGEAL HERNIA REPAIR N/A 06/15/2021  ? Procedure: XI ROBOTIC ASSISTED PARAESOPHAGEAL HERNIA REPAIR, GASTROPEXY, UPPER ENDOSCOPY;  Surgeon: Clovis Riley, MD;   Location: [facility];  Service: General;  Laterality: N/A;  ? ?Patient Active Problem List  ? Diagnosis Date Noted  ? Chronic pain of both knees 01/24/2022  ? Urinary incontinence 11/29/2021  ? Multiple pulmonary nodules 11/29/2021  ? Hypertrophic toenail 11/29/2021  ? Confusion 10/25/2021  ? Vitamin B12 deficiency 10/25/2021  ? DVT (deep venous thrombosis) ([place]) 07/30/2021  ? Dysphagia, post-stroke   ? Hypoalbuminemia due to protein-calorie malnutrition ([place])   ? Benign essential HTN   ? Dyslipidemia   ? Delirium due to multiple etiologies 06/17/2021  ? S/P repair of paraesophageal hernia 06/15/2021  ? CVA (cerebral vascular accident) ([place]) 02/09/2018  ? Hypothyroidism 02/08/2018  ? Essential hypertension 02/08/2018  ? Acute ischemic stroke ([place]) 02/08/2018  ? Chronic urinary tract infection 02/08/2018  ? Acute cystitis without hematuria   ? Right thalamic stroke [facility])   ? Hyperlipidemia   ? ? 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 ?THERAPY DIAG:  ?Other abnormalities of gait and mobility ? ?Muscle weakness (generalized) ? ?Unsteadiness on feet ? ?PERTINENT HISTORY: Left PCA distribution infarct with right homonymous hemianopsia right neglect and cognitive deficits; ?  ? ?PRECAUTIONS: Falls ? ?  SUBJECTIVE:   Pt's husband reports pt started a new medication for her brain. Side affects are feeling sleepy. Pt reports feeling sleepy/exhausted all the time. Pt has follow up with MD about meds in 2 months.  ? ?PAIN:  ?Are you having pain? No ? ? ?TODAY'S TREATMENT:  ? ? [place] Adult PT Treatment/Exercise - 02/15/22 0001   ? ?  ? Transfers  ? Transfers Sit to Stand;Stand to [employer] Transfers   ? Sit to Stand 4: Min guard   ? Stand to Sit 4: Min guard   ? Stand Pivot Transfers 4: Min guard;Other (comment)   with walker  ? Comments cues for hand placement, sequencing, keeping inside walker, hand placement. Multiple reps practising different surface heights.    ?  ? Ambulation/Gait  ? Ambulation/Gait Yes   ? Ambulation/Gait Assistance 4: Min guard   ? Ambulation Distance (Feet) 70 Feet   x2  ? Assistive device None;Rolling walker   first walk holding ball in both hands.  ? Gait Pattern Step-through pattern;Left foot flat;Right foot flat;Right flexed knee in stance;Left flexed knee in stance;Shuffle;Decreased trunk rotation;Trunk flexed;Narrow base of support   ? Ambulation Surface Level   ? Gait Comments household walking with obstacle negotiation. 1st walk holding ball in both hands and tapping cones working on balance, safety, R side awareness, and visual scanning. 2nd walk with walker working on walker management and safety with balance reaching task (one hand holding walker) Mod to max cues needed for all above at min guard level.   ? ?  ?  ? ? ?Home Program: ?Access Code: [email or web address] ?  ?  ?  ?GOALS: ?Goals reviewed with patient? Yes ?  ?SHORT TERM GOALS: Target date: 01/25/2022 ?  ?Pt will perform initial HEP w/min A from husband for improved strength, balance, transfers and gait. ?  ?Baseline: reports independence with current HEP with husband assitance ?Goal status: MET ?  ?2.  Pt will improve Berg Balance to >/= 30/56 to demonstrate improved standing balance and reduced fall risk ?Baseline: 26/56; 34/56 ?Goal status: MET ?  ?3.  Update goal when 5x STS complete and write LTG as appropriate  ?Baseline: 32.38 seconds ?Goal status: MET ?  ?4.  Pt and caregiver will report 4 or fewer falls since eval for reduced fall frequency and improved safety awareness at home ?Baseline: no falls per husband reports ?Goal status: MET ?  ?5.  Pt will trial use of posterior walker for improved balance and safety with functional mobility  ?Baseline: currently using RW; not safe option at this time.  ?Goal status: deferred ?  ?  ?LONG TERM GOALS: Target date: 02/22/2022 ?  ?Pt will perform final HEP w/min A from husband for improved strength, balance, transfers and gait. ?  ?Baseline:   ?Goal status: INITIAL ?  ?2.  Pt will improve gait velocity to at least 1.9 ft/s with LRAD for improved gait efficiency and performance at community ambulator level without recurrent fall risk ?  ?Baseline: 1.46 ft/s with RW ?Goal status: INITIAL ?  ?3.  Pt will improve Berg Balance to >/= 35/56 to demonstrate improved standing balance and reduced fall risk ?Baseline: 26/56 ?Goal status: INITIAL ?  ?4. Pt will improve 5x STS to </= 25 sec to demo improved functional LE strength and balance  ?Baseline: 32.38 seconds  ?Goal status: INITIAL ?  ?5.  Pt and caregiver will teach back techniques for fall prevention in the home and proper fall recovery for improved safety  ?  Baseline:  ?Goal status: INITIAL ?  ?5.  Pt will improve FOTO to 62% or greater for improved functional mobility  ?Baseline: 57% ?Goal status: INITIAL ?  ?  ?ASSESSMENT: ?  ?CLINICAL IMPRESSION: ?Pt willing and able to participate with PT today. Pt continues to require constant cues to navigate home environment with AD or without AD due to R side inattention and visual deficits but is able to complete mobility task at a min guard level overall. ? ? ?  ?OBJECTIVE IMPAIRMENTS Abnormal gait, decreased activity tolerance, decreased balance, decreased cognition, decreased coordination, decreased endurance, decreased knowledge of condition, decreased knowledge of use of DME, decreased mobility, difficulty walking, decreased safety awareness, dizziness, impaired perceived functional ability, impaired vision/preception, and postural dysfunction.  ?  ?ACTIVITY LIMITATIONS cleaning, driving, meal prep, laundry, and medication management.  ?  ?PERSONAL FACTORS Age, Fitness, Time since onset of injury/illness/exacerbation, and 1-2 comorbidities: R homonymous hemianopsia and impaired memory safety awareness  are also affecting patient's functional outcome.  ?  ?  ?REHAB POTENTIAL: Fair due to time since onset, age and cognitive impairments ?  ?CLINICAL DECISION  MAKING: Evolving/moderate complexity ?  ?EVALUATION COMPLEXITY: Moderate ?  ?PLAN: ?PT FREQUENCY: 2x/week ?  ?PT DURATION: 12 weeks ?  ?PLANNED INTERVENTIONS: Therapeutic exercises, Therapeutic activity, Neuro

## 2022-02-18 NOTE — Therapy (Deleted)
OUTPATIENT SPEECH LANGUAGE PATHOLOGY TREATMENT NOTE   Patient Name: Naima Veldhuizen MRN: [id number] DOB:Apr 28, 1936, 86 y.o., female Today's Date: 02/18/2022  PCP: Ronnell Freshwater, NP REFERRING PROVIDER: Charlett Blake, MD   END OF SESSION:       Past Medical History:  Diagnosis Date   Anxiety    Arthritis    Celiac sprue    CVA (cerebral vascular accident) ([place]) 02/2018   R thalamic CVA   GERD (gastroesophageal reflux disease)    Glaucoma    Hormone replacement therapy (HRT)    Hyperlipidemia    Hypertension    Hypoglycemia    Hypothyroidism    OAB (overactive bladder)    Osteoporosis    PONV (postoperative nausea and vomiting)    Raynaud's disease    Scoliosis    Seasonal allergies    Senile purpura ([place])    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 Cheek   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   Bridge Left Nose, and Left Neck   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 Cheek Tx: curret x 3 and 5FU   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   Bridge of nose tx Cx3 and 5FU, Left neck Tx Cx3 and 5FU   Thrombophlebitis    Past Surgical History:  Procedure Laterality Date   ABDOMINAL HYSTERECTOMY  1979   CATARACT EXTRACTION, BILATERAL     knee surgery Left    VARICOSE VEIN SURGERY Left    XI ROBOTIC ASSISTED PARAESOPHAGEAL HERNIA REPAIR N/A 06/15/2021   Procedure: XI ROBOTIC ASSISTED PARAESOPHAGEAL HERNIA REPAIR, GASTROPEXY, UPPER ENDOSCOPY;  Surgeon: Clovis Riley, MD;  Location: [facility];  Service: General;  Laterality: N/A;   Patient Active Problem List   Diagnosis Date Noted   Chronic pain of both knees 01/24/2022   Urinary incontinence 11/29/2021   Multiple pulmonary nodules 11/29/2021   Hypertrophic toenail 11/29/2021   Confusion 10/25/2021   Vitamin B12 deficiency 10/25/2021   DVT (deep venous thrombosis) ([place]) 07/30/2021   Dysphagia, post-stroke    Hypoalbuminemia due to protein-calorie  malnutrition (HCC)    Benign essential HTN    Dyslipidemia    Delirium due to multiple etiologies 06/17/2021   S/P repair of paraesophageal hernia 06/15/2021   CVA (cerebral vascular accident) ([place]) 02/09/2018   Hypothyroidism 02/08/2018   Essential hypertension 02/08/2018   Acute ischemic stroke ([place]) 02/08/2018   Chronic urinary tract infection 02/08/2018   Acute cystitis without hematuria    Right thalamic stroke ([place])    Hyperlipidemia     ONSET DATE: 06/15/2021  REFERRING DIAG:  Q65.784 (ICD-10-CM) - 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 No   :   TODAY'S TREATMENT: SKILLED INTERVENTIONS: 02-18-22: ***  02-15-22: [name] and Nandi brought in memory book. With usual mod A to read the names and relationships, Cesiah ID'd family members. I added their location and occupations. She required cues to use her fingers to help locate each word and each person in the photos due to right visual cut. Paizlee continues to insist her mother is still alive. Lubertha Basque continues to correct her, leading to agitation. Discussed with him accepting her idea that her mom is alive, however Lubertha Basque elects to correct her. She is getting lost in the house and using the half bath that does not have handles. Lubertha Basque uses  a laundry basket to block the way to this bathroom. We generated strategy of placing a stop sign on the basket and on the bathroom door, as well as a sign with an arrow to the bathroom she is supposed to use. Lubertha Basque is aware that he may have to modify the height of the sign to where Kearsten looks down when using her walker. She will need repetitive cues initially to follow the signs.    PATIENT EDUCATION: Education details: see above Person educated: Patient and Spouse Education method: Explanation and Verbal cues Education comprehension: verbal cues required and needs  further education     GOALS: Goals reviewed with patient? Yes   SHORT TERM GOALS: Target date: 01/25/2022   Caregivers will use 2 external aids for pt to find chair controls on the right side of her chair with 50% less cueing from spouse, subjectively Baseline: 01/19/22, 01/21/2022 Goal status: GOAL PARTIALLY MET   2.  Caregivers will carryover 2 external aids to reduce pt from getting up with out asking for help 50% less subjectively by spouse Goal status: GOAL NOT MET   3.  Pt and Caregivers will use memory book for pt to recall 8 family names with usual min A   Baseline: 01/19/22, 01/21/2022 Goal status: GOAL PARTIALLY MET         LONG TERM GOALS: Target date: 03/22/2022   Caregivers will use external aids for memory in 2 additional situations where safety for frustration are a concern  Goal status: ONGOING   2.  Caregivers and pt will use memory book to recall 2 pertinent facts about 5 family members with usual min A Goal status: ONGOING   3.  Caregivers/pt will use external memory aids or memory book to recall personally relevant places, names, trips etc over 4 sessions Goal status: ONGOING   4.  Caregivers/pt will use external aids to recall that she is home, with 50% reduction in confusion /frustration about this reported subjectively by spouse Goal status: ONGOING   5.  Pt will attend to the right side of her memory book with external aids and verbal cues to locate information 3/5x with occasional min A  Goal status: ONGOING   ASSESSMENT:   CLINICAL IMPRESSION: Kalinda Romaniello  presents with impaired memory, orientation, attention, executive functioning. She had MCI dx since 2019. Significant cognitive impairment, impacting pt's ability to recall personally relevant information. Pt is currently requiring full time care. Lubertha Basque is managing all finances, cooking, shopping and meds. He has care attendants a couple days a week who help with cleaning and bathing. Kathreen verbalizes  poor awareness of her cognitive impairment. She insists her mother and other dead relatives are alive, asks where her home is when she is home, wanders without her walker despite her spouse asking her to remain seated. Ongoing training in visual/external aids to recall rooms in the home, family names and locations and to maximize safety. I recommend skilled ST to maximize cognition, train caregivers in compensatory strategies to support memory in daily activities and use of external memory aids for safety and to reduce caregiver burden.  OBJECTIVE IMPAIRMENTS include attention, memory, awareness, and executive functioning. These impairments are limiting patient from  making safe decisions and recalling pertinent information such as family names . Factors affecting potential to achieve goals and functional outcome are medical prognosis, previous level of function, and severity of impairments.. Patient will benefit from skilled SLP services to address above impairments and improve overall function.   REHAB  POTENTIAL: Fair     PLAN: SLP FREQUENCY: 2x/week   SLP DURATION: 12 weeks   PLANNED INTERVENTIONS: Language facilitation, Environmental controls, Cueing hierachy, Cognitive reorganization, Internal/external aids, Functional tasks, Multimodal communication approach, SLP instruction and feedback, and Compensatory strategies  Su Monks, [place]  02/18/2022, 3:27 PM

## 2022-02-19 ENCOUNTER — Ambulatory Visit: Payer: Medicare Other | Admitting: Speech Pathology

## 2022-02-19 ENCOUNTER — Encounter: Payer: Medicare Other | Admitting: Occupational Therapy

## 2022-02-19 DIAGNOSIS — R41841 Cognitive communication deficit: Secondary | ICD-10-CM

## 2022-02-22 ENCOUNTER — Ambulatory Visit: Payer: Medicare Other | Admitting: Physical Therapy

## 2022-02-22 ENCOUNTER — Telehealth: Payer: Self-pay | Admitting: Neurology

## 2022-02-22 ENCOUNTER — Encounter: Payer: Self-pay | Admitting: Speech Pathology

## 2022-02-22 ENCOUNTER — Encounter: Payer: Self-pay | Admitting: Physical Therapy

## 2022-02-22 ENCOUNTER — Encounter: Payer: Medicare Other | Admitting: Occupational Therapy

## 2022-02-22 ENCOUNTER — Ambulatory Visit: Payer: Medicare Other | Admitting: Speech Pathology

## 2022-02-22 DIAGNOSIS — R2689 Other abnormalities of gait and mobility: Secondary | ICD-10-CM | POA: Diagnosis not present

## 2022-02-22 DIAGNOSIS — R2681 Unsteadiness on feet: Secondary | ICD-10-CM

## 2022-02-22 DIAGNOSIS — M6281 Muscle weakness (generalized): Secondary | ICD-10-CM

## 2022-02-22 DIAGNOSIS — R41841 Cognitive communication deficit: Secondary | ICD-10-CM

## 2022-02-22 NOTE — Telephone Encounter (Signed)
Caroline [name] - Patients husband would like to discuss wifes medication Donepezil with you so please give him a call on his cell. She has been on the medication for about 10 days and he isn't sure if she can tolerate she is acting squirrly. He has spoken to you before so he wanted to speak to you directly. Thank you

## 2022-02-22 NOTE — Patient Instructions (Signed)
   When you write titles in the memory book, use a black felt pen and print big in all caps, less hand writing and more like typing   Card sort into suits or black or red sort  Connect 4 stacking red columns and black columns  Review photo albums and write names of people in the book  Large 20 piece jig saw - Do the border, giver her a choice of 2 pieces and guide her to where to put it - be encouraging  Use a lap top to look up past celebrities, TV shows, popular culture from her and her kids times - talk about those.  (1960's -1980's Music from the 1950's -1960's and current events from these era's

## 2022-02-22 NOTE — Telephone Encounter (Signed)
I spoke to the patient's husband. Since taking donepezil 5mg , one tab QHS, she has been up more at night, complains of having vivid dreams. The disruption in sleep causes her to get up confused with hallucinations. He is also giving her quetiapine 25mg , one tab QHS (prescribed by PCP). He will continue that medication. I instructed him to move the donepezil 5mg  to the morning to see if this will help. If not, he will call back for further directions.

## 2022-02-22 NOTE — Therapy (Signed)
OUTPATIENT PHYSICAL THERAPY TREATMENT NOTE & 10th Visit Progress Note  Progress Note  Reporting Period 12-28-21 to 02-22-22  See note below for Objective Data and Assessment of Progress/Goals.      Patient Name: Caroline [name] MRN: [id number] DOB:[birth year], 86 y.o., female Today's Date: 02/22/2022  PCP: Ronnell Freshwater, NP REFERRING PROVIDER:  Alysia Penna, MD   PT End of Session - 02/22/22 1938     Visit Number 10    Number of Visits 17    Date for PT Re-Evaluation 03/22/22    Authorization Type [employer]    Progress Note Due on Visit 10    PT Start Time 1105    PT Stop Time 1150    PT Time Calculation (min) 45 min    Equipment Utilized During Treatment Gait belt    Activity Tolerance Patient tolerated treatment well    Behavior During Therapy WFL for tasks assessed/performed               Past Medical History:  Diagnosis Date   Anxiety    Arthritis    Celiac sprue    CVA (cerebral vascular accident) ([place]) 02/2018   R thalamic CVA   GERD (gastroesophageal reflux disease)    Glaucoma    Hormone replacement therapy (HRT)    Hyperlipidemia    Hypertension    Hypoglycemia    Hypothyroidism    OAB (overactive bladder)    Osteoporosis    PONV (postoperative nausea and vomiting)    Raynaud's disease    Scoliosis    Seasonal allergies    Senile purpura ([place])    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 Cheek   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   Bridge Left Nose, and Left Neck   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 Cheek Tx: curret x 3 and 5FU   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   Bridge of nose tx Cx3 and 5FU, Left neck Tx Cx3 and 5FU   Thrombophlebitis    Past Surgical History:  Procedure Laterality Date   ABDOMINAL HYSTERECTOMY  1979   CATARACT EXTRACTION, BILATERAL     knee surgery Left    VARICOSE VEIN SURGERY Left    XI ROBOTIC ASSISTED PARAESOPHAGEAL  HERNIA REPAIR N/A 06/15/2021   Procedure: XI ROBOTIC ASSISTED PARAESOPHAGEAL HERNIA REPAIR, GASTROPEXY, UPPER ENDOSCOPY;  Surgeon: Clovis Riley, MD;  Location: [facility];  Service: General;  Laterality: N/A;   Patient Active Problem List   Diagnosis Date Noted   Chronic pain of both knees 01/24/2022   Urinary incontinence 11/29/2021   Multiple pulmonary nodules 11/29/2021   Hypertrophic toenail 11/29/2021   Confusion 10/25/2021   Vitamin B12 deficiency 10/25/2021   DVT (deep venous thrombosis) ([place]) 07/30/2021   Dysphagia, post-stroke    Hypoalbuminemia due to protein-calorie malnutrition (HCC)    Benign essential HTN    Dyslipidemia    Delirium due to multiple etiologies 06/17/2021   S/P repair of paraesophageal hernia 06/15/2021   CVA (cerebral vascular accident) ([place]) 02/09/2018   Hypothyroidism 02/08/2018   Essential hypertension 02/08/2018   Acute ischemic stroke ([place]) 02/08/2018   Chronic urinary tract infection 02/08/2018   Acute cystitis without hematuria    Right thalamic stroke ([place])    Hyperlipidemia    Rationale for Evaluation and Treatment Rehabilitation   REFERRING DIAG: O84.166 (ICD-10-CM) - 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 ([place])  THERAPY DIAG:  Other abnormalities of gait and mobility  Muscle weakness (generalized)  Unsteadiness on feet  PERTINENT HISTORY: Left PCA distribution infarct with right homonymous hemianopsia right neglect and cognitive deficits;    PRECAUTIONS: Falls  SUBJECTIVE:  Husband accompanying pt to PT; pt states he needs to call MD about a medication pt is taking - thinks it may be contributing to some "issues" - states pt has been getting up alone at night and walking to bathroom without use of RW Discussed remaining scheduled appts with husband - he states speech has recommended adult Day Care program for patient which he states he may look into  PAIN:  Are you having pain? No   TODAY'S TREATMENT:  02-22-22  TherEx:  Sit to stand transfer from mat x 5 reps without UE support with SBA for safety; 5 times sit to stand  23.44 secs  SciFit on Level 2.5 with bil. UE's and LE's x 6" for strengthening  Strengthening exercises - 2# weight used on each leg; pt performed seated LAQ's 10 reps on each leg; seated hip flexion with 2# weight 10 reps each leg; standing lifting each leg onto 4" step with bil. UE support with 2# weight on each leg 10 reps each   GAIT: Gait pattern: step through pattern, decreased step length- Right, decreased step length- Left, and trunk flexed Distance walked: 115' x 1 rep, 100' x 1 rep with CGA with cues given for direction due to Rt visual deficits; cues to stay close inside RW as pt pushes walker too far in front and then leans anteriorly (trunk flexion)  Assistive device utilized: RW  Level of assistance: CGA  Gait velocity:  26.34 secs with RW = 1.25 ft/sec with RW   NMR:  Pt stood for 1" at side of mat without UE support on RW and without legs braced against mat table - SBA; pt performed 5 head turns side to side with CGA for improve standing balance with head movement   TUG score 28.21 secs with RW   Home Program: Access Code: [email or web address]       GOALS: Goals reviewed with patient? Yes   SHORT TERM GOALS: Target date: 01/25/2022   Pt will perform initial HEP w/min A from husband for improved strength, balance, transfers and gait.   Baseline: reports independence with current HEP with husband assitance Goal status: MET   2.  Pt will improve Berg Balance to >/= 30/56 to demonstrate improved standing balance and reduced fall risk Baseline: 26/56; 34/56 Goal status: MET   3.  Update goal when 5x STS complete and write LTG as appropriate  Baseline: 32.38 seconds Goal status: MET   4.  Pt and caregiver will report 4 or fewer falls since eval for reduced fall frequency and improved safety awareness at home Baseline: no falls per husband reports Goal  status: MET   5.  Pt will trial use of posterior walker for improved balance and safety with functional mobility  Baseline: currently using RW; not safe option at this time.  Goal status: deferred     LONG TERM GOALS: Target date: 02-22-22; REVISED LTG'S - TARGET DATE - 03-11-22   Pt will perform final HEP w/min A from husband for improved strength, balance, transfers and gait.   Baseline:  Goal status: Goal met 02-22-22   2.  Pt will improve gait velocity to at least 1.9 ft/s with LRAD for improved gait efficiency and performance at community ambulator level without recurrent fall risk  Baseline: 1.46 ft/s with RW;  1.25 ft/sec with RW on 02-22-22 Goal status: Goal Not met - ongoing   3.  Pt will improve Berg Balance to >/= 35/56 to demonstrate improved standing balance and reduced fall risk Baseline: 26/56 Goal status: Ongoing/ Not tested due to time constraint   4. Pt will improve 5x STS to </= 25 sec to demo improved functional LE strength and balance  Baseline: 32.38 seconds;  23.44 secs without UE support on 02-22-22  Goal status: MET   5.  Pt and caregiver will teach back techniques for fall prevention in the home and proper fall recovery for improved safety  Baseline:  Goal status: Goal deferred due to safety concerns and decreased cognition/dementia - 02-22-22   5.  Pt will improve FOTO to 62% or greater for improved functional mobility  Baseline: 57% Goal status: INITIAL/Ongoing  6.  Improve TUG score from 28.21 secs with RW to </= 24 secs with RW to demo improved mobility.       Baseline: 28.21 secs with RW    Goal status:  NEW ASSESSMENT:   CLINICAL IMPRESSION: This 10th visit progress note covers dates 12-28-21 - 02-22-22.  PT session focused on 8 week goal assessment.  Pt has met LTG's #1 and 4:  goal #5 is deferred due to safety concerns and decreased cognition/dementia.  Other goals are ongoing for next 2 weeks. Gait training was performed with use of RW to reinforce  use of device at home for safety.  Pt appears to have increased confusion/dementia in today's session as she reported her mother was in town and visited her over the weekend.  Pt appears to be plateauing in maximizing functional progress at this time.  Plan D/C in 2 weeks.  Cont with POC.    OBJECTIVE IMPAIRMENTS Abnormal gait, decreased activity tolerance, decreased balance, decreased cognition, decreased coordination, decreased endurance, decreased knowledge of condition, decreased knowledge of use of DME, decreased mobility, difficulty walking, decreased safety awareness, dizziness, impaired perceived functional ability, impaired vision/preception, and postural dysfunction.    ACTIVITY LIMITATIONS cleaning, driving, meal prep, laundry, and medication management.    PERSONAL FACTORS Age, Fitness, Time since onset of injury/illness/exacerbation, and 1-2 comorbidities: R homonymous hemianopsia and impaired memory safety awareness  are also affecting patient's functional outcome.      REHAB POTENTIAL: Fair due to time since onset, age and cognitive impairments   CLINICAL DECISION MAKING: Evolving/moderate complexity   EVALUATION COMPLEXITY: Moderate   PLAN: PT FREQUENCY: 2x/week   PT DURATION: 12 weeks   PLANNED INTERVENTIONS: Therapeutic exercises, Therapeutic activity, Neuromuscular re-education, Balance training, Gait training, Patient/Family education, Joint mobilization, Stair training, Vestibular training, Canalith repositioning, Visual/preceptual remediation/compensation, and DME instructions   PLAN FOR NEXT SESSION: Gait with RW, activities to promote visual scanning with ambulation. Cont balance & gait training, LE strengthening   Rithvik Orcutt, [place], PT 02/22/2022, 7:51 PM

## 2022-02-22 NOTE — Therapy (Signed)
OUTPATIENT SPEECH LANGUAGE PATHOLOGY TREATMENT NOTE   Patient Name: Caroline [name] MRN: [id number] DOB:10/12/1935, 86 y.o., female Today's Date: 02/22/2022  PCP: Boscia, Heather E, NP REFERRING PROVIDER: Kirsteins, Andrew E, MD   END OF SESSION:   End of Session - 02/22/22 1022     Visit Number 10    Number of Visits 25    Date for SLP Re-Evaluation 03/22/22    SLP Start Time 1020    SLP Stop Time  1100    SLP Time Calculation (min) 40 min    Activity Tolerance Patient tolerated treatment well               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 Cheek   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   Bridge Left Nose, and Left Neck   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 Cheek Tx: curret x 3 and 5FU   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   Bridge of nose tx Cx3 and 5FU, Left neck Tx Cx3 and 5FU   Thrombophlebitis    Past Surgical History:  Procedure Laterality Date   ABDOMINAL HYSTERECTOMY  1979   CATARACT EXTRACTION, BILATERAL     knee surgery Left    VARICOSE VEIN SURGERY Left    XI ROBOTIC ASSISTED PARAESOPHAGEAL HERNIA REPAIR N/A 06/15/2021   Procedure: XI ROBOTIC ASSISTED PARAESOPHAGEAL HERNIA REPAIR, GASTROPEXY, UPPER ENDOSCOPY;  Surgeon: Connor, Chelsea A, MD;  Location: [facility];  Service: General;  Laterality: N/A;   Patient Active Problem List   Diagnosis Date Noted   Chronic pain of both knees 01/24/2022   Urinary  incontinence 11/29/2021   Multiple pulmonary nodules 11/29/2021   Hypertrophic toenail 11/29/2021   Confusion 10/25/2021   Vitamin B12 deficiency 10/25/2021   DVT (deep venous thrombosis) (HCC) 07/30/2021   Dysphagia, post-stroke    Hypoalbuminemia due to protein-calorie malnutrition (HCC)    Benign essential HTN    Dyslipidemia    Delirium due to multiple etiologies 06/17/2021   S/P repair of paraesophageal hernia 06/15/2021   CVA (cerebral vascular accident) (HCC) 02/09/2018   Hypothyroidism 02/08/2018   Essential hypertension 02/08/2018   Acute ischemic stroke (HCC) 02/08/2018   Chronic urinary tract infection 02/08/2018   Acute cystitis without hematuria    Right thalamic stroke (HCC)    Hyperlipidemia     ONSET DATE: 06/15/2021  REFERRING DIAG:  I63.332 (ICD-10-CM) - 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 No   :  Speech Therapy Progress Note  Dates of Reporting Period: 12/28/21 to 02/22/22  Objective Reports of Subjective Statement: Memory book has been made and is helping Caroline [name] recall names of family members, but she   continues to demonstrate confusion thinking her home is not hers and that her mom and daughter are still alive. Ongoing training and education re: modified cognitive and long term memory activities her home aids can do with her.  Objective Measurements: See goals  Goal Update: Continue goals  Plan: Continue POC  Reason Skilled Services are Required: Caregiver education re: redirecting Caroline [name] rather than correcting her, use of memory book and external aids to find rooms in her home.   TODAY'S TREATMENT: SKILLED INTERVENTIONS:  02-22-22: Generated list of simple activities that the aids can do with her. Reviewed memory book with mod A to use written cues in the book to ID family and friends.  She continues to insist her mother is alive. Ongoing education of spouse re: redirections or affirmations may be more helpful than correcting her. Used on line images to stimulate conversation re: current events and popular culture from the 1950's to 1970's with rare min questioning cues to participate in conversation and recall. She continues to require cues at home to locate her chair controls. Caroline [name] has not carried over signs to the bathroom and label the kitchen yet. He  plans to attempt this this week. Educated Caroline [name] re: [facility] [place] for people with memory impairment. He had looked into this a while ago and felt Caroline [name] was not severe enough to attend. Instructed him to look into this again.    02-15-22: Caroline [name] and Caroline [name] brought in memory book. With usual mod A to read the names and relationships, Caroline [name] ID'd family members. I added their location and occupations. She required cues to use her fingers to help locate each word and each person in the photos due to right visual cut. Caroline [name] continues to insist her mother is still alive. Caroline [name] continues to correct her, leading to agitation. Discussed with him accepting her idea that her mom is alive, however Caroline [name] elects to correct her. She is getting lost in the house and using the half bath that does not have handles. Caroline [name] uses a Stage manager to block the way to this bathroom. We generated strategy of placing a stop sign on the basket and on the bathroom door, as well as a sign with an arrow to the bathroom she is supposed to use. Caroline [name] is aware that he may have to modify the height of the sign to where Caroline [name] looks down when using her walker. She will need repetitive cues initially to follow the signs.    02-03-22: Pt presents with some fatigue but overall pleasant mood. Limited orientation, telling ST she was at dentist when she came from doctors. Husband reports they will be trying new medication and he has additional caregivers coming x2  days/week to interact with Caroline [name]. Provided recommendations on activities they can do with Caroline [name] and some modifications to consider. Playing cards with appropriate modifications, planting flowers, playdough, looking at photo albums, sharing magazines and discussing, puzzles, folding laundry. Encouragement to continue targeting R visual scanning during meal times, in using chair, referencing preferred reading materials (magazines), doing activities. Memory book to arrive soon, will plan to bring to next session.     PATIENT EDUCATION: Education details: see above Person educated: Patient and Spouse Education method: Explanation and Verbal cues Education comprehension: verbal cues required and needs further education     GOALS: Goals reviewed with patient? Yes   SHORT TERM GOALS: Target date: 01/25/2022   Caregivers will use 2 external aids for pt to find chair controls on the right  side of her chair with 50% less cueing from spouse, subjectively Baseline: 01/19/22, 01/21/2022 Goal status: GOAL PARTIALLY MET   2.  Caregivers will carryover 2 external aids to reduce pt from getting up with out asking for help 50% less subjectively by spouse Goal status: GOAL NOT MET   3.  Pt and Caregivers will use memory book for pt to recall 8 family names with usual min A   Baseline: 01/19/22, 01/21/2022 Goal status: GOAL PARTIALLY MET         LONG TERM GOALS: Target date: 03/22/2022   Caregivers will use external aids for memory in 2 additional situations where safety for frustration are a concern  Goal status: ONGOING   2.  Caregivers and pt will use memory book to recall 2 pertinent facts about 5 family members with usual min A Goal status: ONGOING   3.  Caregivers/pt will use external memory aids or memory book to recall personally relevant places, names, trips etc over 4 sessions Goal status: ONGOING   4.  Caregivers/pt will use external aids to recall that she is home, with 50% reduction in  confusion /frustration about this reported subjectively by spouse Goal status: ONGOING   5.  Pt will attend to the right side of her memory book with external aids and verbal cues to locate information 3/5x with occasional min A  Goal status: ONGOING   ASSESSMENT:   CLINICAL IMPRESSION: Caroline [name]  presents with impaired memory, orientation, attention, executive functioning. She had MCI dx since 2019. Significant cognitive impairment, impacting pt's ability to recall personally relevant information. Pt is currently requiring full time care. Caroline [name] is managing all finances, cooking, shopping and meds. He has care attendants a couple days a week who help with cleaning and bathing. Caroline [name] verbalizes poor awareness of her cognitive impairment. She insists her mother and other dead relatives are alive, asks where her home is when she is home, wanders without her walker despite her spouse asking her to remain seated. Ongoing training in visual/external aids to recall rooms in the home, family names and locations and to maximize safety. I recommend skilled ST to maximize cognition, train caregivers in compensatory strategies to support memory in daily activities and use of external memory aids for safety and to reduce caregiver burden.  OBJECTIVE IMPAIRMENTS include attention, memory, awareness, and executive functioning. These impairments are limiting patient from  making safe decisions and recalling pertinent information such as family names . Factors affecting potential to achieve goals and functional outcome are medical prognosis, previous level of function, and severity of impairments.. Patient will benefit from skilled SLP services to address above impairments and improve overall function.   REHAB POTENTIAL: Fair     PLAN: SLP FREQUENCY: 2x/week   SLP DURATION: 12 weeks   PLANNED INTERVENTIONS: Language facilitation, Environmental controls, Cueing hierachy, Cognitive reorganization,  Internal/external aids, Functional tasks, Multimodal communication approach, SLP instruction and feedback, and Compensatory strategies  [place] Dost, Annye Rusk, Cornelius  02/22/2022, 11:57 AM

## 2022-02-24 ENCOUNTER — Ambulatory Visit: Payer: Medicare Other | Admitting: Podiatry

## 2022-02-24 ENCOUNTER — Encounter: Payer: Self-pay | Admitting: Podiatry

## 2022-02-24 DIAGNOSIS — B351 Tinea unguium: Secondary | ICD-10-CM | POA: Diagnosis not present

## 2022-02-24 DIAGNOSIS — D689 Coagulation defect, unspecified: Secondary | ICD-10-CM

## 2022-02-24 DIAGNOSIS — M79674 Pain in right toe(s): Secondary | ICD-10-CM

## 2022-02-24 DIAGNOSIS — M79675 Pain in left toe(s): Secondary | ICD-10-CM

## 2022-02-25 ENCOUNTER — Ambulatory Visit: Payer: Medicare Other

## 2022-02-25 ENCOUNTER — Encounter: Payer: Medicare Other | Admitting: Occupational Therapy

## 2022-02-25 ENCOUNTER — Ambulatory Visit: Payer: Medicare Other | Admitting: Speech Pathology

## 2022-02-25 DIAGNOSIS — M6281 Muscle weakness (generalized): Secondary | ICD-10-CM

## 2022-02-25 DIAGNOSIS — R2689 Other abnormalities of gait and mobility: Secondary | ICD-10-CM

## 2022-02-25 DIAGNOSIS — R41841 Cognitive communication deficit: Secondary | ICD-10-CM

## 2022-02-25 DIAGNOSIS — R2681 Unsteadiness on feet: Secondary | ICD-10-CM

## 2022-02-25 NOTE — Therapy (Signed)
OUTPATIENT PHYSICAL THERAPY TREATMENT NOTE  Progress Note   Patient Name: Caroline [name] MRN: [id number] DOB:30-Jul-1936, 86 y.o., female Today's Date: 02/25/2022  PCP: Ronnell Freshwater, NP REFERRING PROVIDER:  Alysia Penna, MD   PT End of Session - 02/25/22 1101     Visit Number 11    Number of Visits 17    Date for PT Re-Evaluation 03/22/22    Authorization Type [employer]    Progress Note Due on Visit 10    PT Start Time Martha During Treatment Gait belt    Activity Tolerance Patient tolerated treatment well    Behavior During Therapy WFL for tasks assessed/performed               Past Medical History:  Diagnosis Date   Anxiety    Arthritis    Celiac sprue    CVA (cerebral vascular accident) ([place]) 02/2018   R thalamic CVA   GERD (gastroesophageal reflux disease)    Glaucoma    Hormone replacement therapy (HRT)    Hyperlipidemia    Hypertension    Hypoglycemia    Hypothyroidism    OAB (overactive bladder)    Osteoporosis    PONV (postoperative nausea and vomiting)    Raynaud's disease    Scoliosis    Seasonal allergies    Senile purpura ([place])    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 Cheek   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   Bridge Left Nose, and Left Neck   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 Cheek Tx: curret x 3 and 5FU   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   Bridge of nose tx Cx3 and 5FU, Left neck Tx Cx3 and 5FU   Thrombophlebitis    Past Surgical History:  Procedure Laterality Date   ABDOMINAL HYSTERECTOMY  1979   CATARACT EXTRACTION, BILATERAL     knee surgery Left    VARICOSE VEIN SURGERY Left    XI ROBOTIC ASSISTED PARAESOPHAGEAL HERNIA REPAIR N/A 06/15/2021   Procedure: XI ROBOTIC ASSISTED PARAESOPHAGEAL HERNIA REPAIR, GASTROPEXY, UPPER ENDOSCOPY;  Surgeon: Clovis Riley, MD;  Location: [facility];  Service: General;   Laterality: N/A;   Patient Active Problem List   Diagnosis Date Noted   Chronic pain of both knees 01/24/2022   Urinary incontinence 11/29/2021   Multiple pulmonary nodules 11/29/2021   Hypertrophic toenail 11/29/2021   Confusion 10/25/2021   Vitamin B12 deficiency 10/25/2021   DVT (deep venous thrombosis) ([place]) 07/30/2021   Dysphagia, post-stroke    Hypoalbuminemia due to protein-calorie malnutrition (HCC)    Benign essential HTN    Dyslipidemia    Delirium due to multiple etiologies 06/17/2021   S/P repair of paraesophageal hernia 06/15/2021   CVA (cerebral vascular accident) ([place]) 02/09/2018   Hypothyroidism 02/08/2018   Essential hypertension 02/08/2018   Acute ischemic stroke ([place]) 02/08/2018   Chronic urinary tract infection 02/08/2018   Acute cystitis without hematuria    Right thalamic stroke ([place])    Hyperlipidemia    Rationale for Evaluation and Treatment Rehabilitation   REFERRING DIAG: N79.728 (ICD-10-CM) - 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 No falls to report.   PAIN:  Are you having pain? No   TODAY'S TREATMENT:  TherEx: NuStep on Level 4 with bil. BUE's and BLE's x 7 minutes for strengthening and endurance/activity tolerance. Patient require cues to maintain pace > 50 steps/minute.   Standing Strengthening exercises - Completed Mini Squats with BUE with light support from chair, completed 2 x 10 reps with cues for technique. Then completed standing heel raises 2 x 10 reps. Standing rest break required between sets due to fatigue.   Seated Strengthening exercises: Completed LAQ with 3# ankle  weights bilaterally, with PT providing target to kick to promote improved technique.    GAIT: Gait pattern: step through pattern, decreased step length- Right, decreased step length- Left, and trunk flexed Distance walked: 75' x 1 (ambulating into session). 230' x 1 rep with obstacles around course to promote visual scanning, CGA with cues given for direction due to Rt visual deficits; cues to stay inside RW to promote reduced trunk flexion and safety.  Seated rest break required due to fatigue.  Assistive device utilized: RW  Level of assistance: CGA    NMR:  Standing on Blue Mat with CGA from PT, completed bean bag toss x 10 tosses. Intermittent cues for standing up tall and improved hip extension. Then progressed it by next set of ten tosses, engaging patient in visual scanning and reaching for bean bag across midline in various directions and then tossing bean bag to target. Seated rest break required intermittent. Pt intermittent posterior lean and bracing legs against mat to compensate for decreased stability.    Home Program: Access Code: [email or web address]       GOALS: Goals reviewed with patient? Yes   SHORT TERM GOALS: Target date: 01/25/2022   Pt will perform initial HEP w/min A from husband for improved strength, balance, transfers and gait.   Baseline: reports independence with current HEP with husband assitance Goal status: MET   2.  Pt will improve Berg Balance to >/= 30/56 to demonstrate improved standing balance and reduced fall risk Baseline: 26/56; 34/56 Goal status: MET   3.  Update goal when 5x STS complete and write LTG as appropriate  Baseline: 32.38 seconds Goal status: MET   4.  Pt and caregiver will report 4 or fewer falls since eval for reduced fall frequency and improved safety awareness at home Baseline: no falls per husband reports Goal status: MET   5.  Pt will trial use of posterior walker for improved balance and safety with functional mobility   Baseline: currently using RW; not safe option at this time.  Goal status: deferred     LONG TERM GOALS: Target date: 02-22-22; REVISED LTG'S - TARGET DATE - 03-11-22   Pt will perform final HEP w/min A from husband for improved strength, balance, transfers and gait.   Baseline:  Goal status: Goal met 02-22-22   2.  Pt will improve gait velocity to at least 1.9 ft/s with LRAD for improved gait efficiency and performance at community ambulator level without recurrent fall risk   Baseline: 1.46 ft/s with RW;  1.25 ft/sec with RW on 02-22-22 Goal status: Goal Not met - ongoing   3.  Pt will improve Berg Balance to >/= 35/56 to demonstrate improved standing balance and reduced fall risk Baseline: 26/56 Goal status: Ongoing/ Not tested due to time constraint   4. Pt will improve 5x STS to </= 25 sec to demo improved functional LE strength and  balance  Baseline: 32.38 seconds;  23.44 secs without UE support on 02-22-22  Goal status: MET   5.  Pt and caregiver will teach back techniques for fall prevention in the home and proper fall recovery for improved safety  Baseline:  Goal status: Goal deferred due to safety concerns and decreased cognition/dementia - 02-22-22   5.  Pt will improve FOTO to 62% or greater for improved functional mobility  Baseline: 57% Goal status: INITIAL/Ongoing  6.  Improve TUG score from 28.21 secs with RW to </= 24 secs with RW to demo improved mobility.       Baseline: 28.21 secs with RW    Goal status:  NEW ASSESSMENT:   CLINICAL IMPRESSION: Today's skilled PT session focused on continued gait training with use of RW, negotiating around obstacles to promote visual scanning. Most challenge noted with posture requiring frequent cues. Rest of session spent continued NMR focused on standing static and dynamic balance, and BLE strengthening. Intermittent rest breaks required due to fatigue.    OBJECTIVE IMPAIRMENTS Abnormal gait, decreased activity tolerance,  decreased balance, decreased cognition, decreased coordination, decreased endurance, decreased knowledge of condition, decreased knowledge of use of DME, decreased mobility, difficulty walking, decreased safety awareness, dizziness, impaired perceived functional ability, impaired vision/preception, and postural dysfunction.    ACTIVITY LIMITATIONS cleaning, driving, meal prep, laundry, and medication management.    PERSONAL FACTORS Age, Fitness, Time since onset of injury/illness/exacerbation, and 1-2 comorbidities: R homonymous hemianopsia and impaired memory safety awareness  are also affecting patient's functional outcome.      REHAB POTENTIAL: Fair due to time since onset, age and cognitive impairments   CLINICAL DECISION MAKING: Evolving/moderate complexity   EVALUATION COMPLEXITY: Moderate   PLAN: PT FREQUENCY: 2x/week   PT DURATION: 12 weeks   PLANNED INTERVENTIONS: Therapeutic exercises, Therapeutic activity, Neuromuscular re-education, Balance training, Gait training, Patient/Family education, Joint mobilization, Stair training, Vestibular training, Canalith repositioning, Visual/preceptual remediation/compensation, and DME instructions   PLAN FOR NEXT SESSION: Gait with RW, activities to promote visual scanning with ambulation. Cont balance & gait training, LE strengthening. Begin to check goals and update HEP. Planned d/c at end of POC.   Jones Bales, PT, DPT 02/25/2022, 11:01 AM

## 2022-02-25 NOTE — Therapy (Signed)
OUTPATIENT SPEECH LANGUAGE PATHOLOGY TREATMENT NOTE   Patient Name: Caroline [name] MRN: [id number] DOB:[birth year], 86 y.o., female Today's Date: 02/25/2022  PCP: Ronnell Freshwater, NP REFERRING PROVIDER: Charlett Blake, MD   END OF SESSION:   End of Session - 02/25/22 1118     Visit Number 11    Number of Visits 25    Date for SLP Re-Evaluation 03/22/22    SLP Start Time 1145    SLP Stop Time  1228    SLP Time Calculation (min) 43 min    Activity Tolerance Patient tolerated treatment well               Past Medical History:  Diagnosis Date   Anxiety    Arthritis    Celiac sprue    CVA (cerebral vascular accident) ([place]) 02/2018   R thalamic CVA   GERD (gastroesophageal reflux disease)    Glaucoma    Hormone replacement therapy (HRT)    Hyperlipidemia    Hypertension    Hypoglycemia    Hypothyroidism    OAB (overactive bladder)    Osteoporosis    PONV (postoperative nausea and vomiting)    Raynaud's disease    Scoliosis    Seasonal allergies    Senile purpura ([place])    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 Cheek   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   Bridge Left Nose, and Left Neck   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 Cheek Tx: curret x 3 and 5FU   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   Bridge of nose tx Cx3 and 5FU, Left neck Tx Cx3 and 5FU   Thrombophlebitis    Past Surgical History:  Procedure Laterality Date   ABDOMINAL HYSTERECTOMY  1979   CATARACT EXTRACTION, BILATERAL     knee surgery Left    VARICOSE VEIN SURGERY Left    XI ROBOTIC ASSISTED PARAESOPHAGEAL HERNIA REPAIR N/A 06/15/2021   Procedure: XI ROBOTIC ASSISTED PARAESOPHAGEAL HERNIA REPAIR, GASTROPEXY, UPPER ENDOSCOPY;  Surgeon: Clovis Riley, MD;  Location: [facility];  Service: General;  Laterality: N/A;   Patient Active Problem List   Diagnosis Date Noted   Chronic pain of both knees 01/24/2022   Urinary  incontinence 11/29/2021   Multiple pulmonary nodules 11/29/2021   Hypertrophic toenail 11/29/2021   Confusion 10/25/2021   Vitamin B12 deficiency 10/25/2021   DVT (deep venous thrombosis) ([place]) 07/30/2021   Dysphagia, post-stroke    Hypoalbuminemia due to protein-calorie malnutrition (HCC)    Benign essential HTN    Dyslipidemia    Delirium due to multiple etiologies 06/17/2021   S/P repair of paraesophageal hernia 06/15/2021   CVA (cerebral vascular accident) ([place]) 02/09/2018   Hypothyroidism 02/08/2018   Essential hypertension 02/08/2018   Acute ischemic stroke ([place]) 02/08/2018   Chronic urinary tract infection 02/08/2018   Acute cystitis without hematuria    Right thalamic stroke ([place])    Hyperlipidemia     ONSET DATE: 06/15/2021  REFERRING DIAG:  H15.056 (ICD-10-CM) - 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  SUBJECTIVE: "it's hard when you're visiting"  PAIN:  Are you having pain? No   SPEECH THERAPY DISCHARGE SUMMARY  Visits from Start of Care: 11  Current functional level related to goals / functional outcomes: Mrs. Tagliaferri has dx of dementia. Husband, Lubertha Basque, has been provided with recommendations for materials to orient pt,  cognitive activities and modifications to use at home, and compensatory strategies to mitigate safety concerns and frustration. Inconsistent implementation of targets discussed in therapy. Has implemented memory book.    Remaining deficits: Continues to ? Her environment, have limited orientation, R neglect. Inconsistent implementation of compensations and strategies at home to aid success.    Education / Equipment: Visual cues, memory book, use of memory book, cognitive activities, using affirmations when confused.    Patient agrees to discharge. Patient goals were partially met. Patient is being discharged due to maximized rehab potential.      TODAY'S TREATMENT: SKILLED  INTERVENTIONS:  02-25-22: Reviewed strategies and compensations which have been recommended to increase pt's orientation, memory, and safety. Discussion on importance of implementing strategies which take ambiguity out of situations such as which bathroom to use. Reviewed memory book recommendations and activities aides can be doing with Mrs. Juarez. Re-education on two-fold benefit of [facility], to provide [place] with activities to keep her busy and time for Lubertha Basque to take care of things he needs to do. SLP recommends that pt continues calling daughters at night to check on them, as this allows her to relax and go to bed. [name] agrees that this is easier than trying to reason with Axie that he daughters are still living at home. Provided recommendations for compensations they can use for a trip to the [place]: mark important items with bright tape or signs, frequent reviewing of where they are and why, bringing all materials needed to maintain safety.   02-22-22: Generated list of simple activities that the aids can do with her. Reviewed memory book with mod A to use written cues in the book to ID family and friends. She continues to insist her mother is alive. Ongoing education of spouse re: redirections or affirmations may be more helpful than correcting her. Used on line images to stimulate conversation re: current events and popular culture from the 1950's to 1970's with rare min questioning cues to participate in conversation and recall. She continues to require cues at home to locate her chair controls. Lubertha Basque has not carried over signs to the bathroom and label the kitchen yet. He  plans to attempt this this week. Educated [name] re: [facility] for people with memory impairment. He had looked into this a while ago and felt Baljit was not severe enough to attend. Instructed him to look into this again.     PATIENT EDUCATION: Education details: see above Person educated: Patient and  Spouse Education method: Explanation and Verbal cues Education comprehension: verbal cues required and needs further education     GOALS: Goals reviewed with patient? Yes   SHORT TERM GOALS: Target date: 01/25/2022   Caregivers will use 2 external aids for pt to find chair controls on the right side of her chair with 50% less cueing from spouse, subjectively Baseline: 01/19/22, 01/21/2022 Goal status: GOAL PARTIALLY MET   2.  Caregivers will carryover 2 external aids to reduce pt from getting up with out asking for help 50% less subjectively by spouse Goal status: GOAL NOT MET   3.  Pt and Caregivers will use memory book for pt to recall 8 family names with usual min A   Baseline: 01/19/22, 01/21/2022 Goal status: GOAL PARTIALLY MET         LONG TERM GOALS: Target date: 03/22/2022   Caregivers will use external aids for memory in 2 additional situations where safety for frustration are a concern Baseline:  02/25/22  Goal status: NOT MET   2.  Caregivers and pt will use memory book to recall 2 pertinent facts about 5 family members with usual min A Baseline: 02/25/22  Goal status: MET   3.  Caregivers/pt will use external memory aids or memory book to recall personally relevant places, names, trips etc over 4 sessions Baseline: 02/25/22  Goal status: PARTIALLY MET   4.  Caregivers/pt will use external aids to recall that she is home, with 50% reduction in confusion /frustration about this reported subjectively by spouse Baseline: 02/25/22  Goal status: NOT MET   5.  Pt will attend to the right side of her memory book with external aids and verbal cues to locate information 3/5x with occasional min A Baseline: 02/25/22  Goal status: MET   ASSESSMENT:   CLINICAL IMPRESSION: Sivan Cuello  presents with impaired memory, orientation, attention, executive functioning. She had MCI dx since 2019. Significant cognitive impairment, impacting pt's ability to recall personally relevant  information. Pt is currently requiring full time care. Lubertha Basque is managing all finances, cooking, shopping and meds. He has care attendants a couple days a week who help with cleaning and bathing. Tahari verbalizes poor awareness of her cognitive impairment. She insists her mother and other dead relatives are alive, asks where her home is when she is home, wanders without her walker despite her spouse asking her to remain seated. Patient and spouse have received training in visual/external aids to recall rooms in the home, family names and locations and to maximize safety. Pt is being d/c at this time. Husband is satisfied with progress, rehab potential has been maximized.   OBJECTIVE IMPAIRMENTS include attention, memory, awareness, and executive functioning. These impairments are limiting patient from  making safe decisions and recalling pertinent information such as family names . Factors affecting potential to achieve goals and functional outcome are medical prognosis, previous level of function, and severity of impairments.. Patient will benefit from skilled SLP services to address above impairments and improve overall function.   REHAB POTENTIAL: Fair     PLAN: SLP FREQUENCY: 2x/week   SLP DURATION: 12 weeks   PLANNED INTERVENTIONS: Language facilitation, Environmental controls, Cueing hierachy, Cognitive reorganization, Internal/external aids, Functional tasks, Multimodal communication approach, SLP instruction and feedback, and Compensatory strategies  Su Monks, [place]  02/25/2022, 11:19 AM

## 2022-03-01 NOTE — Progress Notes (Signed)
  Subjective:  Patient ID: Caroline [name], female    DOB: 12/09/1935,  MRN: [id number]  Caroline [name] presents to clinic today for at risk foot care. Patient has history of clotting disorder and Raynaud's disease and painful elongated mycotic toenails 1-5 bilaterally which are tender when wearing enclosed shoe gear. Pain is relieved with periodic professional debridement.  Patient is accompanied by her husband on today's visit. They voice no new pedal complaints on today  PCP is Ronnell Freshwater, NP , and last visit was November 19, 2021.  Allergies  Allergen Reactions   Gluten Meal Nausea And Vomiting    Patient has CELIAC DISEASE   Wheat Bran Nausea And Vomiting    Patient has CELIAC DISEASE   Adhesive [Tape] Other (See Comments)    Patient's skin is VERY THIN and tears very easily; please use either paper tape or Coban wrap   Codeine Nausea And Vomiting   Other Other (See Comments)    Patient has Hypoglycemia; her husband stated "when her sugar crashes, it crashes hard."   Penicillins Other (See Comments)    Doesn't remember reaction , but denies any SOB/Swelling States " it was a long time ago "     Review of Systems: Negative except as noted in the HPI.  Objective: No changes noted in today's physical examination. Objective:   Vascular Examination: Vascular status intact b/l with palpable pedal pulses. Pedal hair present b/l. CFT immediate b/l. No edema. No pain with calf compression b/l. Skin temperature gradient WNL b/l.   Neurological Examination: Sensation grossly intact b/l with 10 gram monofilament. Vibratory sensation intact b/l.   Dermatological Examination: Pedal skin with normal turgor, texture and tone b/l. Toenails 1-5 b/l thick, discolored, elongated with subungual debris and pain on dorsal palpation. No hyperkeratotic lesions noted b/l.   Musculoskeletal Examination: Muscle strength 5/5 to b/l LE. Limited joint ROM to the STJ b/l. Utilizes walker for ambulation  assistance.  Radiographs: None  Last A1c:      Latest Ref Rng & Units 10/22/2021    8:41 AM 06/19/2021    9:52 AM  Hemoglobin A1C  Hemoglobin-A1c 4.8 - 5.6 % 5.7   5.5      Assessment/Plan: No diagnosis found.   -Patient was evaluated and treated. All patient's and/or POA's questions/concerns answered on today's visit. -Patient to continue soft, supportive shoe gear daily. -Mycotic toenails 1-5 bilaterally were debrided in length and girth with sterile nail nippers and dremel without incident. -Patient/POA to call should there be question/concern in the interim.   Return in about 3 months (around 05/27/2022).  Marzetta Board, DPM

## 2022-03-02 ENCOUNTER — Ambulatory Visit: Payer: Medicare Other | Admitting: Physical Therapy

## 2022-03-04 ENCOUNTER — Ambulatory Visit: Payer: Medicare Other | Admitting: Physical Therapy

## 2022-03-08 ENCOUNTER — Ambulatory Visit: Payer: Medicare Other | Attending: Nurse Practitioner

## 2022-03-08 ENCOUNTER — Ambulatory Visit (INDEPENDENT_AMBULATORY_CARE_PROVIDER_SITE_OTHER): Payer: Medicare Other | Admitting: Nurse Practitioner

## 2022-03-08 ENCOUNTER — Encounter: Payer: Self-pay | Admitting: Nurse Practitioner

## 2022-03-08 VITALS — BP 117/65 | HR 89 | Temp 97.8°F | Ht 64.0 in | Wt 135.0 lb

## 2022-03-08 DIAGNOSIS — F05 Delirium due to known physiological condition: Secondary | ICD-10-CM

## 2022-03-08 DIAGNOSIS — M6281 Muscle weakness (generalized): Secondary | ICD-10-CM | POA: Diagnosis present

## 2022-03-08 DIAGNOSIS — E039 Hypothyroidism, unspecified: Secondary | ICD-10-CM | POA: Diagnosis not present

## 2022-03-08 DIAGNOSIS — R2681 Unsteadiness on feet: Secondary | ICD-10-CM | POA: Diagnosis present

## 2022-03-08 DIAGNOSIS — I10 Essential (primary) hypertension: Secondary | ICD-10-CM

## 2022-03-08 DIAGNOSIS — R2689 Other abnormalities of gait and mobility: Secondary | ICD-10-CM | POA: Diagnosis present

## 2022-03-08 DIAGNOSIS — N390 Urinary tract infection, site not specified: Secondary | ICD-10-CM

## 2022-03-08 LAB — POCT URINALYSIS DIP (CLINITEK)
Bilirubin, UA: NEGATIVE
Blood, UA: NEGATIVE
Glucose, UA: NEGATIVE mg/dL
Ketones, POC UA: NEGATIVE mg/dL
Leukocytes, UA: NEGATIVE
Nitrite, UA: NEGATIVE
POC PROTEIN,UA: NEGATIVE
Spec Grav, UA: 1.02 (ref 1.010–1.025)
Urobilinogen, UA: 0.2 E.U./dL
pH, UA: 7 (ref 5.0–8.0)

## 2022-03-08 NOTE — Progress Notes (Signed)
Established patient visit   Patient: Caroline [name]   DOB: 1936-10-03   86 y.o. Female  MRN: [id number] Visit Date: 03/08/2022   Chief Complaint  Patient presents with   GI Problem   Subjective    HPI  The patient is here for evaluation of diarrhea.  -started after she started aricept 5mg  daily. Thought process was this, along with other medication, especially antibiotic, was causing her to have the diarrhea.  -patient's husband states that they have started decreasing the dose of macrobid 50 mg to every other day. He is alternating this with fiber supplement.  -diet is now mostly BRAT diet.  -stools are more formed and diarrhea is much less.  -patient and husband would like to see new urology provider. They were unhappy with the care they received with last provider.  -have a form to be filled out for her to start attending [facility].    Medications: Outpatient Medications Prior to Visit  Medication Sig   acetaminophen (TYLENOL) 325 MG tablet Take 2 tablets (650 mg total) by mouth every 6 (six) hours as needed for mild pain, moderate pain, fever or headache.   Ascorbic Acid (VITAMIN C) 1000 MG tablet Take 1,000 mg by mouth daily.   atorvastatin (LIPITOR) 80 MG tablet Take 1 tablet (80 mg total) by mouth at bedtime.   Catheters MISC Use as directed. 1 box of inserts and 1 box canisters.   Cholecalciferol (VITAMIN D-3) 25 MCG (1000 UT) CAPS Take 1 capsule (1,000 Units total) by mouth daily.   Coenzyme Q10 (COQ10 PO) Take 1 tablet by mouth daily.   donepezil (ARICEPT) 10 MG tablet Take 1 tablet (10 mg total) by mouth daily.   ELIQUIS 5 MG TABS tablet TAKE 1 TABLET BY MOUTH 2 TIMES DAILY.   furosemide (LASIX) 20 MG tablet Take 1 tablet (20 mg total) by mouth daily as needed for edema.   irbesartan (AVAPRO) 75 MG tablet Take 1 tablet po QAM and take 1/2 tablet po Qpm   levothyroxine (SYNTHROID) 125 MCG tablet TAKE 1 TABLET (125 MCG TOTAL) BY MOUTH DAILY AT 6 AM.   meclizine  (ANTIVERT) 25 MG tablet Take 0.5 tablets (12.5 mg total) by mouth 3 (three) times daily as needed for dizziness.   memantine (NAMENDA) 10 MG tablet Take 1 tablet (10 mg total) by mouth 2 (two) times daily.   nitrofurantoin (MACRODANTIN) 50 MG capsule Take 1 capsule (50 mg total) by mouth daily.   ondansetron (ZOFRAN) 4 MG tablet Take 1 tablet (4 mg total) by mouth every 6 (six) hours.   pantoprazole (PROTONIX) 40 MG tablet Take 1 tablet po QD   QUEtiapine (SEROQUEL) 25 MG tablet TAKE 1 TABLET (25 MG TOTAL) BY MOUTH AT BEDTIME.   Vibegron (GEMTESA) 75 MG TABS Take 75 mg by mouth daily.   vitamin B-12 (CYANOCOBALAMIN) 250 MCG tablet Take 1 tablet (250 mcg total) by mouth daily.   VITAMIN D PO Take 250 mg by mouth.   No facility-administered medications prior to visit.    Review of Systems  Constitutional:  Positive for fatigue. Negative for activity change, appetite change, chills and fever.  HENT:  Negative for congestion, postnasal drip, rhinorrhea, sinus pressure, sinus pain, sneezing and sore throat.   Eyes: Negative.   Respiratory:  Negative for cough, chest tightness, shortness of breath and wheezing.   Cardiovascular:  Negative for chest pain and palpitations.  Gastrointestinal:  Positive for diarrhea. Negative for abdominal pain, constipation, nausea and vomiting.  Endocrine: Negative for cold intolerance, heat intolerance, polydipsia and polyuria.  Genitourinary:  Negative for dyspareunia, dysuria, flank pain, frequency and urgency.       Chronic urinary tract infection.  Musculoskeletal:  Negative for arthralgias, back pain and myalgias.  Skin:  Negative for rash.  Allergic/Immunologic: Negative for environmental allergies.  Neurological:  Positive for weakness. Negative for dizziness and headaches.  Hematological:  Negative for adenopathy.  Psychiatric/Behavioral:  Positive for confusion. The patient is nervous/anxious.       Objective     Today's Vitals   03/08/22 1502   BP: 117/65  Pulse: 89  Temp: 97.8 F (36.6 C)  SpO2: 95%  Weight: 135 lb (61.2 kg)  Height: 5\' 4"  (1.626 m)   Body mass index is 23.17 kg/m.   BP Readings from Last 3 Encounters:  03/08/22 117/65  02/04/22 125/73  01/20/22 (!) 153/66    Wt Readings from Last 3 Encounters:  03/08/22 135 lb (61.2 kg)  02/04/22 135 lb 6.4 oz (61.4 kg)  01/20/22 142 lb (64.4 kg)    Physical Exam Vitals and nursing note reviewed.  Constitutional:      Appearance: Normal appearance. She is well-developed.  HENT:     Head: Normocephalic and atraumatic.  Eyes:     Pupils: Pupils are equal, round, and reactive to light.  Neck:     Vascular: No carotid bruit.  Cardiovascular:     Rate and Rhythm: Normal rate and regular rhythm.     Pulses: Normal pulses.     Heart sounds: Normal heart sounds.  Pulmonary:     Effort: Pulmonary effort is normal.     Breath sounds: Normal breath sounds.  Abdominal:     Palpations: Abdomen is soft.  Genitourinary:    Comments: Urine sample negative for evidence of infection or other abnormality. Musculoskeletal:        General: Normal range of motion.     Cervical back: Normal range of motion and neck supple.  Lymphadenopathy:     Cervical: No cervical adenopathy.  Skin:    General: Skin is warm and dry.     Capillary Refill: Capillary refill takes less than 2 seconds.  Neurological:     General: No focal deficit present.     Mental Status: She is alert and oriented to person, place, and time. Mental status is at baseline.  Psychiatric:        Mood and Affect: Mood normal.        Behavior: Behavior normal.        Thought Content: Thought content normal.        Judgment: Judgment normal.     Results for orders placed or performed in visit on 03/08/22  Urine Culture   Specimen: Urine   Urine  Result Value Ref Range   Urine Culture, Routine Final report    Organism ID, Bacteria Comment   POCT URINALYSIS DIP (CLINITEK)  Result Value Ref Range    Color, UA yellow yellow   Clarity, UA clear clear   Glucose, UA negative negative mg/dL   Bilirubin, UA negative negative   Ketones, POC UA negative negative mg/dL   Spec Grav, UA [id number] - 1.025   Blood, UA negative negative   pH, UA 7.0 5.0 - 8.0   POC PROTEIN,UA negative negative, trace   Urobilinogen, UA 0.2 0.2 or 1.0 E.U./dL   Nitrite, UA Negative Negative   Leukocytes, UA Negative Negative    Assessment & Plan  1. Chronic urinary tract infection Urine sample negative for infection or other abnormalities today.  Send urine sample for culture and sensitivity due to history of chronic infection.  We will adjust antibiotic therapy as indicated.  If she is - POCT URINALYSIS DIP (CLINITEK) - Urine Culture; Future - Urine Culture  2. Delirium due to multiple etiologies Recent decline in mental status not due to UTI.  Patient's husband reports improved mental status.  She should continue regular follow-ups with neurology as scheduled.  3. Essential hypertension Stable.  Continue blood pressure medication as prescribed.  4. Acquired hypothyroidism Thyroid panel stable.  Continue levothyroxine as prescribed.  Problem List Items Addressed This Visit       Cardiovascular and Mediastinum   Essential hypertension     Endocrine   Hypothyroidism     Nervous and Auditory   Delirium due to multiple etiologies     Genitourinary   Chronic urinary tract infection - Primary   Relevant Orders   POCT URINALYSIS DIP (CLINITEK) (Completed)   Urine Culture (Completed)     Return for as scheduled.         Carlean Jews, NP  [facility] [phone] (phone) [phone] (fax)  [facility]

## 2022-03-08 NOTE — Therapy (Signed)
OUTPATIENT PHYSICAL THERAPY TREATMENT NOTE    Patient Name: Caroline [name] MRN: [id number] DOB:04/25/1936, 86 y.o., female Today's Date: 03/08/2022  PCP: Boscia, Heather E, NP REFERRING PROVIDER:  Kirsteins, Andrew, MD   PT End of Session - 03/08/22 1101     Visit Number 12    Number of Visits 17    Date for PT Re-Evaluation 03/22/22    Authorization Type [employer]    Progress Note Due on Visit 10    PT Start Time 1101    PT Stop Time 1145    PT Time Calculation (min) 44 min    Equipment Utilized During Treatment Gait belt    Activity Tolerance Patient tolerated treatment well    Behavior During Therapy WFL for tasks assessed/performed               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 Cheek   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   Bridge Left Nose, and Left Neck   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 Cheek Tx: curret x 3 and 5FU   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   Bridge of nose tx Cx3 and 5FU, Left neck Tx Cx3 and 5FU   Thrombophlebitis    Past Surgical History:  Procedure Laterality Date   ABDOMINAL HYSTERECTOMY  1979   CATARACT EXTRACTION, BILATERAL     knee surgery Left    VARICOSE VEIN SURGERY Left    XI ROBOTIC ASSISTED PARAESOPHAGEAL HERNIA REPAIR N/A 06/15/2021   Procedure: XI ROBOTIC ASSISTED PARAESOPHAGEAL HERNIA REPAIR, GASTROPEXY, UPPER ENDOSCOPY;  Surgeon: Connor, Chelsea A, MD;   Location: [facility];  Service: General;  Laterality: N/A;   Patient Active Problem List   Diagnosis Date Noted   Chronic pain of both knees 01/24/2022   Urinary incontinence 11/29/2021   Multiple pulmonary nodules 11/29/2021   Hypertrophic toenail 11/29/2021   Confusion 10/25/2021   Vitamin B12 deficiency 10/25/2021   DVT (deep venous thrombosis) (HCC) 07/30/2021   Dysphagia, post-stroke    Hypoalbuminemia due to protein-calorie malnutrition (HCC)    Benign essential HTN    Dyslipidemia    Delirium due to multiple etiologies 06/17/2021   S/P repair of paraesophageal hernia 06/15/2021   CVA (cerebral vascular accident) (HCC) 02/09/2018   Hypothyroidism 02/08/2018   Essential hypertension 02/08/2018   Acute ischemic stroke (HCC) 02/08/2018   Chronic urinary tract infection 02/08/2018   Acute cystitis without hematuria    Right thalamic stroke (HCC)    Hyperlipidemia    Rationale for Evaluation and Treatment Rehabilitation   REFERRING DIAG: I63.332 (ICD-10-CM) - 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 No other new changes/complaints. No falls.   PAIN:  Are you having pain? Yes: NPRS scale: 5/10 Pain location: generalized; all over Pain description: aching   TODAY'S TREATMENT: GAIT: Gait pattern: step through pattern, decreased step length- Right, decreased step length- Left, and trunk flexed Distance walked: 75' x 1 (ambulating into session). 230' x 1 rep with obstacles around course to promote visual scanning, CGA with cues given for direction due to Rt visual deficits; cues to stay inside RW to promote reduced trunk flexion  and safety.  Seated rest break required due to fatigue.  Assistive device utilized: RW  Level of assistance: CGA   TherEx:  Reviewed entire HEP and updated to patient's current progress. Patient tolerated all activities well. Intermittent cues provided, but educated spouse on proper cueing and completion of all activities well. To help promote compliance, educated to completed strength exercises (first 3 exercises) on MWF, and then second set of exercises (balance exercises) on Tues/Thurs. Patient and spouse agreeable on this plan.   Access Code: [email or web address] URL: [email or web address] Date: 03/08/2022 Prepared by: Baldomero Lamy  Exercises - Sitting Knee Extension with Resistance  - 1 x daily - 5 x weekly - 1 sets - 10 reps (with Blue TB) - Seated Hip Abduction with Resistance  - 1 x daily - 3-4 x weekly - 1 sets - 10 reps (with RedTB) - Seated March with Resistance  - 1 x daily - 3-4 x weekly - 1 sets - 10 reps (with Red TB)   - Sit to Stand with Armchair  - 1 x daily - 3-4 x weekly - 1 sets - 10 reps - Standing Toe Taps at Counter  - 1 x daily - 3-4 x weekly - 1 sets - 10 reps - Standing with Head Rotation  - 1 x daily - 3-4 x weekly - 1 sets - 10 reps   Home Program: Access Code: [email or web address]       GOALS: Goals reviewed with patient? Yes   SHORT TERM GOALS: Target date: 01/25/2022   Pt will perform initial HEP w/min A from husband for improved strength, balance, transfers and gait.   Baseline: reports independence with current HEP with husband assitance Goal status: MET   2.  Pt will improve Berg Balance to >/= 30/56 to demonstrate improved standing balance and reduced fall risk Baseline: 26/56; 34/56 Goal status: MET   3.  Update goal when 5x STS complete and write LTG as appropriate  Baseline: 32.38 seconds Goal status: MET   4.  Pt and caregiver will report 4 or fewer falls since eval for reduced fall frequency and improved safety awareness at home Baseline:  no falls per husband reports Goal status: MET   5.  Pt will trial use of posterior walker for improved balance and safety with functional mobility  Baseline: currently using RW; not safe option at this time.  Goal status: deferred     LONG TERM GOALS: Target date: 02-22-22; REVISED LTG'S - TARGET DATE - 03-11-22   Pt will perform final HEP w/min A from husband for improved strength, balance, transfers and gait.   Baseline:  Goal status: Goal met 02-22-22   2.  Pt will improve gait velocity to at least 1.9 ft/s with LRAD for improved gait efficiency and performance at community ambulator level without recurrent fall risk   Baseline: 1.46 ft/s with RW;  1.25 ft/sec with RW on 02-22-22 Goal status: Goal Not met - ongoing   3.  Pt will improve Berg Balance to >/= 35/56 to demonstrate improved standing balance and reduced fall risk Baseline: 26/56 Goal status: Ongoing/ Not tested due to time constraint   4. Pt will improve 5x STS to </= 25 sec to demo improved functional LE strength and balance  Baseline: 32.38 seconds;  23.44 secs without UE support on 02-22-22  Goal status: MET   5.  Pt and caregiver will teach back techniques for fall prevention in the home and proper fall recovery for improved safety  Baseline:  Goal status: Goal deferred due to safety concerns and decreased cognition/dementia - 02-22-22   5.  Pt will improve FOTO to 62% or greater for improved functional mobility  Baseline: 57% Goal status: INITIAL/Ongoing  6.  Improve TUG score from 28.21 secs with RW to </= 24 secs with RW to demo improved mobility.       Baseline: 28.21 secs with RW    Goal status:  NEW ASSESSMENT:   CLINICAL IMPRESSION: Today's skilled PT session focused on entire review of HEP, and progressing to patient's tolerance. Able to update and add in progressive exercises for strength/balance. Did split HEP into two options and alternate days to promote improved compliance. Pt and spouse agreeable.  Will plan to assess progress toward LTGs and d/c at end of POC.    OBJECTIVE IMPAIRMENTS Abnormal gait, decreased activity tolerance, decreased balance, decreased cognition, decreased coordination, decreased endurance, decreased knowledge of condition, decreased knowledge of use of DME, decreased mobility, difficulty walking, decreased safety awareness, dizziness, impaired perceived functional ability, impaired vision/preception, and postural dysfunction.    ACTIVITY LIMITATIONS cleaning, driving, meal prep, laundry, and medication management.    PERSONAL FACTORS Age, Fitness, Time since onset of injury/illness/exacerbation, and 1-2 comorbidities: R homonymous hemianopsia and impaired memory safety awareness  are also affecting patient's functional outcome.      REHAB POTENTIAL: Fair due to time since onset, age and cognitive impairments   CLINICAL DECISION MAKING: Evolving/moderate complexity   EVALUATION COMPLEXITY: Moderate   PLAN: PT FREQUENCY: 2x/week   PT DURATION: 12 weeks   PLANNED INTERVENTIONS: Therapeutic exercises, Therapeutic activity, Neuromuscular re-education, Balance training, Gait training, Patient/Family education, Joint mobilization, Stair training, Vestibular training, Canalith repositioning, Visual/preceptual remediation/compensation, and DME instructions   PLAN FOR NEXT SESSION: Finish checking goals. Planned d/c at end of POC.    B , PT, DPT 03/08/2022, 12:02 PM     

## 2022-03-08 NOTE — Progress Notes (Signed)
Sent urine sample for culture and sensitivity due to chronic infection

## 2022-03-10 LAB — URINE CULTURE

## 2022-03-11 ENCOUNTER — Ambulatory Visit: Payer: Medicare Other | Admitting: Physical Therapy

## 2022-03-11 DIAGNOSIS — R2681 Unsteadiness on feet: Secondary | ICD-10-CM

## 2022-03-11 DIAGNOSIS — R2689 Other abnormalities of gait and mobility: Secondary | ICD-10-CM | POA: Diagnosis not present

## 2022-03-11 DIAGNOSIS — M6281 Muscle weakness (generalized): Secondary | ICD-10-CM

## 2022-03-11 NOTE — Therapy (Signed)
OUTPATIENT PHYSICAL THERAPY TREATMENT NOTE & DISCHARGE SUMMARY   Patient Name: Caroline [name] MRN: [id number] DOB:01-Oct-1936, 86 y.o., female Today's Date: 03/12/2022  PCP: Ronnell Freshwater, NP REFERRING PROVIDER:  Alysia Penna, MD   PT End of Session - 03/12/22 1047     Visit Number 13    Number of Visits 17    Date for PT Re-Evaluation 03/22/22    Authorization Type [employer]    Progress Note Due on Visit 10    PT Start Time 1102    PT Stop Time 1146    PT Time Calculation (min) 44 min    Equipment Utilized During Treatment Gait belt    Activity Tolerance Patient tolerated treatment well    Behavior During Therapy WFL for tasks assessed/performed                Past Medical History:  Diagnosis Date   Anxiety    Arthritis    Celiac sprue    CVA (cerebral vascular accident) ([place]) 02/2018   R thalamic CVA   GERD (gastroesophageal reflux disease)    Glaucoma    Hormone replacement therapy (HRT)    Hyperlipidemia    Hypertension    Hypoglycemia    Hypothyroidism    OAB (overactive bladder)    Osteoporosis    PONV (postoperative nausea and vomiting)    Raynaud's disease    Scoliosis    Seasonal allergies    Senile purpura ([place])    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 Cheek   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   Bridge Left Nose, and Left Neck   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 Cheek Tx: curret x 3 and 5FU   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   Bridge of nose tx Cx3 and 5FU, Left neck Tx Cx3 and 5FU   Thrombophlebitis    Past Surgical History:  Procedure Laterality Date   ABDOMINAL HYSTERECTOMY  1979   CATARACT EXTRACTION, BILATERAL     knee surgery Left    VARICOSE VEIN SURGERY Left    XI ROBOTIC ASSISTED PARAESOPHAGEAL HERNIA REPAIR N/A 06/15/2021   Procedure: XI ROBOTIC ASSISTED PARAESOPHAGEAL HERNIA REPAIR, GASTROPEXY, UPPER ENDOSCOPY;  Surgeon:  Clovis Riley, MD;  Location: [facility];  Service: General;  Laterality: N/A;   Patient Active Problem List   Diagnosis Date Noted   Chronic pain of both knees 01/24/2022   Urinary incontinence 11/29/2021   Multiple pulmonary nodules 11/29/2021   Hypertrophic toenail 11/29/2021   Confusion 10/25/2021   Vitamin B12 deficiency 10/25/2021   DVT (deep venous thrombosis) ([place]) 07/30/2021   Dysphagia, post-stroke    Hypoalbuminemia due to protein-calorie malnutrition (HCC)    Benign essential HTN    Dyslipidemia    Delirium due to multiple etiologies 06/17/2021   S/P repair of paraesophageal hernia 06/15/2021   CVA (cerebral vascular accident) ([place]) 02/09/2018   Hypothyroidism 02/08/2018   Essential hypertension 02/08/2018   Acute ischemic stroke ([place]) 02/08/2018   Chronic urinary tract infection 02/08/2018   Acute cystitis without hematuria    Right thalamic stroke ([place])    Hyperlipidemia    Rationale for Evaluation and Treatment Rehabilitation   REFERRING DIAG: X32.440 (ICD-10-CM) - 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 Yes: NPRS scale: 4/10 Pain location: generalized; all over Pain description: aching   TODAY'S TREATMENT: GAIT: Gait pattern: step through pattern, decreased step length- Right, decreased step length- Left, and trunk flexed Distance walked: 75' x 1 (ambulating into session). Assistive device utilized: RW  Level of assistance: CGA   Gait velocity 24.68 secs with RW = 1.33 ft/sec  NeuroRe-ed:  [facility] PT Assessment - 03/12/22 0001       Berg Balance Test   Sit  to Stand Able to stand without using hands and stabilize independently    Standing Unsupported Able to stand 30 seconds unsupported   stood for 1" only due to legs quivering   Sitting with Back Unsupported but Feet Supported on Floor or Stool Able to sit safely and securely 2 minutes    Stand to Sit Controls descent by using hands    Transfers Able to transfer safely, definite need of hands    Standing Unsupported with Eyes Closed Able to stand 10 seconds with supervision    Standing Unsupported with Feet Together Needs help to attain position but able to stand for 30 seconds with feet together    From Standing, Reach Forward with Outstretched Arm Can reach forward >5 cm safely (2")    From Standing Position, Pick up Object from Floor Unable to pick up shoe, but reaches 2-5 cm (1-2") from shoe and balances independently    From Standing Position, Turn to Look Behind Over each Shoulder Turn sideways only but maintains balance    Turn 360 Degrees Needs close supervision or verbal cueing    Standing Unsupported, Alternately Place Feet on Step/Stool Able to complete 4 steps without aid or supervision    Standing Unsupported, One Foot in Front Able to take small step independently and hold 30 seconds    Standing on One Leg Tries to lift leg/unable to hold 3 seconds but remains standing independently    Total Score 32    Berg comment: 32/56             TUG score;  26.69 secs with RW    TherAct.:   Pt performed floor to stand transfer from tall kneeling position on floor with bil. UE support on mat with min assist Pt declined to transfer from tall kneeling to sitting on floor due to c/o knee discomfort Husband and pt were instructed in sequence for this transfer should pt fall at home   Access Code: [email or web address] URL: [email or web address] Date: 03/08/2022 Prepared by: Baldomero Lamy  Exercises - Sitting Knee Extension with Resistance  - 1 x daily - 5 x weekly - 1 sets - 10  reps (with Blue TB) - Seated Hip Abduction with Resistance  - 1 x daily - 3-4 x weekly - 1 sets - 10 reps (with RedTB) - Seated March with Resistance  - 1 x daily - 3-4 x weekly - 1 sets - 10 reps (with Red TB)   - Sit to Stand with Armchair  - 1 x daily - 3-4 x weekly - 1 sets - 10 reps - Standing Toe Taps at Counter  - 1 x daily - 3-4 x weekly - 1 sets - 10 reps - Standing with Head Rotation  - 1 x daily - 3-4 x weekly - 1 sets - 10  reps    Home Program: Access Code: [email or web address]       GOALS: Goals reviewed with patient? Yes   SHORT TERM GOALS: Target date: 01/25/2022   Pt will perform initial HEP w/min A from husband for improved strength, balance, transfers and gait.   Baseline: reports independence with current HEP with husband assitance Goal status: MET   2.  Pt will improve Berg Balance to >/= 30/56 to demonstrate improved standing balance and reduced fall risk Baseline: 26/56; 34/56 Goal status: MET   3.  Update goal when 5x STS complete and write LTG as appropriate  Baseline: 32.38 seconds Goal status: MET   4.  Pt and caregiver will report 4 or fewer falls since eval for reduced fall frequency and improved safety awareness at home Baseline: no falls per husband reports Goal status: MET   5.  Pt will trial use of posterior walker for improved balance and safety with functional mobility  Baseline: currently using RW; not safe option at this time.  Goal status: deferred     LONG TERM GOALS: Target date: 02-22-22; REVISED LTG'S - TARGET DATE - 03-11-22   Pt will perform final HEP w/min A from husband for improved strength, balance, transfers and gait.   Baseline:  Goal status: Goal met 02-22-22   2.  Pt will improve gait velocity to at least 1.9 ft/s with LRAD for improved gait efficiency and performance at community ambulator level without recurrent fall risk   Baseline: 1.46 ft/s with RW;  1.25 ft/sec with RW on 02-22-22;  1.33 ft/sec with RW on 03-11-22  (24.68  secs) Goal status: Goal Not met 03-11-22   3.  Pt will improve Berg Balance to >/= 35/56 to demonstrate improved standing balance and reduced fall risk Baseline: 26/56 Goal status:  PARTIALLY MET - score 34/56 on 03-11-22   4. Pt will improve 5x STS to </= 25 sec to demo improved functional LE strength and balance  Baseline: 32.38 seconds;  23.44 secs without UE support on 02-22-22  Goal status: MET 02-22-22   5.  Pt and caregiver will teach back techniques for fall prevention in the home and proper fall recovery for improved safety  Baseline:  Goal status: Goal MET 03-11-22 - min assist with floor to stand transfers   6.  Pt will improve FOTO to 62% or greater for improved functional mobility  Baseline: 57% Goal status: Goal met   6.  Improve TUG score from 28.21 secs with RW to </= 24 secs with RW to demo improved mobility.       Baseline: 28.21 secs with RW; 26.69 secs    Goal status:  NOT MET - 03-11-22 ASSESSMENT:   CLINICAL IMPRESSION: Pt has met LTG's #1, 4 and 5:  LTG #2 not met as gait velocity has decreased from 1.46 ft/sec to 1.33 ft/sec with RW on 03-11-22:  LTG #3 partially met as Berg score = 34/56 (goal closely approximated as set for score of 35/56); LTG #6 not met as TUG score = 26.69 secs with RW.  Pt is discharged due to plateau in maximizing functional progress at this time and due to end of certification period.  Pt and husband agree with D/C plan; pt was informed that pt may return to PT in future if she has change in functional status.     OBJECTIVE IMPAIRMENTS Abnormal gait, decreased activity tolerance, decreased balance, decreased cognition, decreased coordination, decreased endurance, decreased knowledge of condition, decreased knowledge of use of  DME, decreased mobility, difficulty walking, decreased safety awareness, dizziness, impaired perceived functional ability, impaired vision/preception, and postural dysfunction.    ACTIVITY LIMITATIONS cleaning, driving, meal  prep, laundry, and medication management.    PERSONAL FACTORS Age, Fitness, Time since onset of injury/illness/exacerbation, and 1-2 comorbidities: R homonymous hemianopsia and impaired memory safety awareness  are also affecting patient's functional outcome.      REHAB POTENTIAL: Fair due to time since onset, age and cognitive impairments   CLINICAL DECISION MAKING: Evolving/moderate complexity   EVALUATION COMPLEXITY: Moderate   PLAN: PT FREQUENCY: 2x/week   PT DURATION: 12 weeks   PLANNED INTERVENTIONS: Therapeutic exercises, Therapeutic activity, Neuromuscular re-education, Balance training, Gait training, Patient/Family education, Joint mobilization, Stair training, Vestibular training, Canalith repositioning, Visual/preceptual remediation/compensation, and DME instructions   PLAN FOR NEXT SESSION: D/C on 03-11-22   PHYSICAL THERAPY DISCHARGE SUMMARY  Visits from Start of Care: 13  Current functional level related to goals / functional outcomes: See above for progress towards goals   Remaining deficits: Pt continues to have decreased standing balance and dependency with gait with pt requiring use of RW for safety.  Pt has decreased memory/cognition which impacts safety awareness.     Education / Equipment: Pt and husband have been instructed in HEP for balance and strengthening exercises.     Patient agrees to discharge. Patient goals were partially met. Patient is being discharged due to maximized rehab potential.  Goals have been partially met.    Alda Lea, PT 03/12/2022, 11:01 AM

## 2022-03-12 ENCOUNTER — Encounter: Payer: Self-pay | Admitting: Physical Therapy

## 2022-03-22 NOTE — Progress Notes (Unsigned)
Established patient visit   Patient: Caroline [name]   DOB: 04-Mar-1936   86 y.o. Female  MRN: [id number] Visit Date: 03/23/2022   No chief complaint on file.  Subjective    HPI  Routine follow up.  -recently treated for utI. Normal flora bacteria responsible for infection. Currently takes macrobid 50mg  daily to prevent chronic infection.  -would like referral to new urologist.  -sees neurology due to dementia after stroke.  -chronic HTN. Recently more well managed.    Medications: Outpatient Medications Prior to Visit  Medication Sig   acetaminophen (TYLENOL) 325 MG tablet Take 2 tablets (650 mg total) by mouth every 6 (six) hours as needed for mild pain, moderate pain, fever or headache.   Ascorbic Acid (VITAMIN C) 1000 MG tablet Take 1,000 mg by mouth daily.   atorvastatin (LIPITOR) 80 MG tablet Take 1 tablet (80 mg total) by mouth at bedtime.   Catheters MISC Use as directed. 1 box of inserts and 1 box canisters.   Cholecalciferol (VITAMIN D-3) 25 MCG (1000 UT) CAPS Take 1 capsule (1,000 Units total) by mouth daily.   Coenzyme Q10 (COQ10 PO) Take 1 tablet by mouth daily.   donepezil (ARICEPT) 10 MG tablet Take 1 tablet (10 mg total) by mouth daily.   ELIQUIS 5 MG TABS tablet TAKE 1 TABLET BY MOUTH 2 TIMES DAILY.   furosemide (LASIX) 20 MG tablet Take 1 tablet (20 mg total) by mouth daily as needed for edema.   irbesartan (AVAPRO) 75 MG tablet Take 1 tablet po QAM and take 1/2 tablet po Qpm   levothyroxine (SYNTHROID) 125 MCG tablet TAKE 1 TABLET (125 MCG TOTAL) BY MOUTH DAILY AT 6 AM.   meclizine (ANTIVERT) 25 MG tablet Take 0.5 tablets (12.5 mg total) by mouth 3 (three) times daily as needed for dizziness.   memantine (NAMENDA) 10 MG tablet Take 1 tablet (10 mg total) by mouth 2 (two) times daily.   nitrofurantoin (MACRODANTIN) 50 MG capsule Take 1 capsule (50 mg total) by mouth daily.   ondansetron (ZOFRAN) 4 MG tablet Take 1 tablet (4 mg total) by mouth every 6 (six) hours.    pantoprazole (PROTONIX) 40 MG tablet Take 1 tablet po QD   QUEtiapine (SEROQUEL) 25 MG tablet TAKE 1 TABLET (25 MG TOTAL) BY MOUTH AT BEDTIME.   Vibegron (GEMTESA) 75 MG TABS Take 75 mg by mouth daily.   vitamin B-12 (CYANOCOBALAMIN) 250 MCG tablet Take 1 tablet (250 mcg total) by mouth daily.   VITAMIN D PO Take 250 mg by mouth.   No facility-administered medications prior to visit.    Review of Systems  {Labs (Optional):23779}   Objective    There were no vitals taken for this visit. BP Readings from Last 3 Encounters:  03/08/22 117/65  02/04/22 125/73  01/20/22 (!) 153/66    Wt Readings from Last 3 Encounters:  03/08/22 135 lb (61.2 kg)  02/04/22 135 lb 6.4 oz (61.4 kg)  01/20/22 142 lb (64.4 kg)    Physical Exam  ***  No results found for any visits on 03/23/22.  Assessment & Plan     Problem List Items Addressed This Visit   None    No follow-ups on file.         03/25/22, NP  [facility] [phone] (phone) [phone] (fax)  [facility]

## 2022-03-23 ENCOUNTER — Encounter: Payer: Self-pay | Admitting: Nurse Practitioner

## 2022-03-23 ENCOUNTER — Telehealth: Payer: Self-pay | Admitting: Nurse Practitioner

## 2022-03-23 ENCOUNTER — Other Ambulatory Visit: Payer: Self-pay | Admitting: Nurse Practitioner

## 2022-03-23 ENCOUNTER — Ambulatory Visit (INDEPENDENT_AMBULATORY_CARE_PROVIDER_SITE_OTHER): Payer: Medicare Other | Admitting: Nurse Practitioner

## 2022-03-23 VITALS — BP 153/64 | HR 77 | Temp 97.7°F | Ht 64.17 in | Wt 135.0 lb

## 2022-03-23 DIAGNOSIS — R32 Unspecified urinary incontinence: Secondary | ICD-10-CM | POA: Diagnosis not present

## 2022-03-23 DIAGNOSIS — I10 Essential (primary) hypertension: Secondary | ICD-10-CM

## 2022-03-23 DIAGNOSIS — N390 Urinary tract infection, site not specified: Secondary | ICD-10-CM

## 2022-03-23 DIAGNOSIS — J988 Other specified respiratory disorders: Secondary | ICD-10-CM

## 2022-03-23 DIAGNOSIS — R059 Cough, unspecified: Secondary | ICD-10-CM | POA: Diagnosis not present

## 2022-03-23 DIAGNOSIS — F05 Delirium due to known physiological condition: Secondary | ICD-10-CM

## 2022-03-23 MED ORDER — MOLNUPIRAVIR EUA 200MG CAPSULE: 4.0000 | ORAL_CAPSULE | Freq: Two times a day (BID) | ORAL | 0 refills | Status: AC

## 2022-03-23 NOTE — Telephone Encounter (Signed)
Patient's daughter called and stated patient tested positive for Covid. She is asking if you will call in medication for her? Please advise.

## 2022-03-23 NOTE — Telephone Encounter (Signed)
I sent in the mulnupirivir which is approved antiviral for COVID 19. I sent this to [facility]

## 2022-03-23 NOTE — Telephone Encounter (Signed)
Called pt spoke to daughter she is advised of the Rx that was sent

## 2022-03-24 LAB — NOVEL CORONAVIRUS, NAA: SARS-CoV-2, NAA: NOT DETECTED

## 2022-03-24 NOTE — Progress Notes (Signed)
Test that we ran is negative for Caroline [name].

## 2022-03-26 ENCOUNTER — Other Ambulatory Visit: Payer: Self-pay | Admitting: Nurse Practitioner

## 2022-03-26 DIAGNOSIS — I10 Essential (primary) hypertension: Secondary | ICD-10-CM

## 2022-03-29 ENCOUNTER — Telehealth: Payer: Self-pay | Admitting: Nurse Practitioner

## 2022-03-30 ENCOUNTER — Ambulatory Visit: Payer: Medicare Other | Admitting: Urology

## 2022-03-31 ENCOUNTER — Other Ambulatory Visit: Payer: Self-pay | Admitting: Nurse Practitioner

## 2022-03-31 ENCOUNTER — Telehealth: Payer: Self-pay | Admitting: Nurse Practitioner

## 2022-03-31 DIAGNOSIS — R32 Unspecified urinary incontinence: Secondary | ICD-10-CM

## 2022-03-31 DIAGNOSIS — N390 Urinary tract infection, site not specified: Secondary | ICD-10-CM

## 2022-03-31 NOTE — Telephone Encounter (Signed)
Made new referral to Dr. Scott McDairmid with [facility]. This is on [address] In [place].  

## 2022-03-31 NOTE — Telephone Encounter (Signed)
Patient has appointment to meet with [facility] tomorrow and needs forms filled out or else they will have to cancel the appointment. The fax number [phone] and if you can please call Holly(daughter) back when done to let her know.

## 2022-03-31 NOTE — Telephone Encounter (Signed)
Holly's number is [phone]

## 2022-03-31 NOTE — Telephone Encounter (Signed)
Paper work is faxed called daughter she is asking for another referral

## 2022-03-31 NOTE — Telephone Encounter (Signed)
Ok thanks 

## 2022-03-31 NOTE — Progress Notes (Signed)
Made new referral to Dr. Lorin Picket McDairmid with [facility]. This is on [address] In [place].

## 2022-04-25 ENCOUNTER — Encounter: Payer: Self-pay | Admitting: Nurse Practitioner

## 2022-04-26 ENCOUNTER — Other Ambulatory Visit: Payer: Self-pay | Admitting: Nurse Practitioner

## 2022-04-26 DIAGNOSIS — I10 Essential (primary) hypertension: Secondary | ICD-10-CM

## 2022-04-26 DIAGNOSIS — K219 Gastro-esophageal reflux disease without esophagitis: Secondary | ICD-10-CM

## 2022-04-26 DIAGNOSIS — E039 Hypothyroidism, unspecified: Secondary | ICD-10-CM

## 2022-04-26 DIAGNOSIS — I82412 Acute embolism and thrombosis of left femoral vein: Secondary | ICD-10-CM

## 2022-04-29 ENCOUNTER — Other Ambulatory Visit: Payer: Self-pay | Admitting: Nurse Practitioner

## 2022-04-29 DIAGNOSIS — E785 Hyperlipidemia, unspecified: Secondary | ICD-10-CM

## 2022-04-30 ENCOUNTER — Other Ambulatory Visit: Payer: Medicare Other

## 2022-04-30 ENCOUNTER — Other Ambulatory Visit: Payer: Self-pay | Admitting: Nurse Practitioner

## 2022-04-30 DIAGNOSIS — R410 Disorientation, unspecified: Secondary | ICD-10-CM

## 2022-04-30 DIAGNOSIS — N390 Urinary tract infection, site not specified: Secondary | ICD-10-CM

## 2022-04-30 DIAGNOSIS — R32 Unspecified urinary incontinence: Secondary | ICD-10-CM

## 2022-05-03 NOTE — Telephone Encounter (Signed)
It is ok to do urology referral. And I did the last time they were here. Even though I specify "not the same provider" it seems to be going to the same provider or provider office.

## 2022-05-04 ENCOUNTER — Telehealth: Payer: Self-pay | Admitting: Nurse Practitioner

## 2022-05-04 LAB — URINE CULTURE

## 2022-05-04 NOTE — Telephone Encounter (Signed)
Lmtc to come in office for shot of Rocephin for UTI.

## 2022-05-05 ENCOUNTER — Telehealth: Payer: Self-pay | Admitting: Nurse Practitioner

## 2022-05-05 ENCOUNTER — Ambulatory Visit (INDEPENDENT_AMBULATORY_CARE_PROVIDER_SITE_OTHER): Payer: Medicare Other | Admitting: Nurse Practitioner

## 2022-05-05 DIAGNOSIS — N390 Urinary tract infection, site not specified: Secondary | ICD-10-CM | POA: Diagnosis not present

## 2022-05-05 MED ORDER — CEFTRIAXONE SODIUM 1 G IJ SOLR
1.0000 g | Freq: Once | INTRAMUSCULAR | Status: AC
  Administered 2022-05-05: 1 g via INTRAMUSCULAR

## 2022-05-05 NOTE — Telephone Encounter (Signed)
On 03/31/2022 , I made new referral to MACDIARMID, SCOTT. I believe this is a different provider than where she went before. I looks like the referral has been approved and ready for scheduling. I cannot find the contact information for them to call that office to schedule

## 2022-05-05 NOTE — Telephone Encounter (Signed)
Patient is requesting to not go to the urologist in [place]-they want to go to [facility]-can you please place new referral?

## 2022-05-07 ENCOUNTER — Encounter: Payer: Self-pay | Admitting: *Deleted

## 2022-05-14 NOTE — Progress Notes (Signed)
Patient received a ceftriaxone injection today for urinary tract infection. She tolerated this well.

## 2022-05-27 ENCOUNTER — Ambulatory Visit (INDEPENDENT_AMBULATORY_CARE_PROVIDER_SITE_OTHER): Payer: Medicare Other | Admitting: Nurse Practitioner

## 2022-05-27 ENCOUNTER — Encounter: Payer: Self-pay | Admitting: Nurse Practitioner

## 2022-05-27 VITALS — BP 163/66 | HR 84 | Temp 97.7°F | Ht 64.0 in | Wt 135.0 lb

## 2022-05-27 DIAGNOSIS — I10 Essential (primary) hypertension: Secondary | ICD-10-CM

## 2022-05-27 DIAGNOSIS — N390 Urinary tract infection, site not specified: Secondary | ICD-10-CM

## 2022-05-27 DIAGNOSIS — F05 Delirium due to known physiological condition: Secondary | ICD-10-CM

## 2022-05-27 DIAGNOSIS — R32 Unspecified urinary incontinence: Secondary | ICD-10-CM | POA: Diagnosis not present

## 2022-05-27 LAB — POCT URINALYSIS DIPSTICK
Bilirubin, UA: NEGATIVE
Blood, UA: NEGATIVE
Glucose, UA: NEGATIVE
Ketones, UA: NEGATIVE
Leukocytes, UA: NEGATIVE
Nitrite, UA: NEGATIVE
Protein, UA: NEGATIVE
Spec Grav, UA: 1.025 (ref 1.010–1.025)
Urobilinogen, UA: 0.2 E.U./dL
pH, UA: 5.5 (ref 5.0–8.0)

## 2022-05-27 NOTE — Progress Notes (Signed)
Established patient visit   Patient: Caroline [name]   DOB: 05/24/1936   86 y.o. Female  MRN: [id number] Visit Date: 05/27/2022  Chief Complaint  Patient presents with   Headache   Subjective    HPI  The patient is here with her husband. They state that she had been feeling poorly yesterday. She had headache and was a little more confused than usual. She has a history of urinary tract infections which will cause her to have increased levels of dementia.  -today's urine sample is negative for infection or other abnormality.  -now on a new medication to help with urinary incontinence. Will follow up with new urologist provider in September.  -continues to see neurology. Has been nearly a year since she had her stroke.    Medications: Outpatient Medications Prior to Visit  Medication Sig   acetaminophen (TYLENOL) 325 MG tablet Take 2 tablets (650 mg total) by mouth every 6 (six) hours as needed for mild pain, moderate pain, fever or headache.   Ascorbic Acid (VITAMIN C) 1000 MG tablet Take 1,000 mg by mouth daily.   atorvastatin (LIPITOR) 80 MG tablet TAKE 1 TABLET (80 MG TOTAL) BY MOUTH AT BEDTIME.   Catheters MISC Use as directed. 1 box of inserts and 1 box canisters.   donepezil (ARICEPT) 10 MG tablet Take 1 tablet (10 mg total) by mouth daily.   ELIQUIS 5 MG TABS tablet TAKE 1 TABLET BY MOUTH 2 TIMES DAILY.   furosemide (LASIX) 20 MG tablet Take 1 tablet (20 mg total) by mouth daily as needed for edema.   irbesartan (AVAPRO) 75 MG tablet TAKE 1 TABLET BY MOUTH IN THE MORNING AND 1/2 TABLET IN THE EVENING.   levothyroxine (SYNTHROID) 125 MCG tablet TAKE 1 TABLET (125 MCG TOTAL) BY MOUTH DAILY AT 6 AM.   meclizine (ANTIVERT) 25 MG tablet Take 0.5 tablets (12.5 mg total) by mouth 3 (three) times daily as needed for dizziness.   nitrofurantoin (MACRODANTIN) 50 MG capsule Take 1 capsule (50 mg total) by mouth daily.   ondansetron (ZOFRAN) 4 MG tablet Take 1 tablet (4 mg total) by mouth every 6  (six) hours. (Patient taking differently: Take 4 mg by mouth every 8 (eight) hours as needed for nausea or vomiting.)   pantoprazole (PROTONIX) 40 MG tablet TAKE 1 TABLET BY MOUTH DAILY   QUEtiapine (SEROQUEL) 25 MG tablet TAKE 1 TABLET (25 MG TOTAL) BY MOUTH AT BEDTIME.   Vibegron (GEMTESA) 75 MG TABS Take 75 mg by mouth daily.   vitamin B-12 (CYANOCOBALAMIN) 250 MCG tablet Take 1 tablet (250 mcg total) by mouth daily.   [DISCONTINUED] Cholecalciferol (VITAMIN D-3) 25 MCG (1000 UT) CAPS Take 1 capsule (1,000 Units total) by mouth daily. (Patient not taking: Reported on 05/29/2022)   [DISCONTINUED] Coenzyme Q10 (COQ10 PO) Take 1 tablet by mouth daily. (Patient not taking: Reported on 05/29/2022)   [DISCONTINUED] VITAMIN D PO Take 250 mg by mouth.   No facility-administered medications prior to visit.    Review of Systems  Constitutional:  Negative for activity change, appetite change, chills, fatigue and fever.  HENT:  Negative for congestion, postnasal drip, rhinorrhea, sinus pressure, sinus pain, sneezing and sore throat.   Eyes: Negative.   Respiratory:  Negative for cough, chest tightness, shortness of breath and wheezing.   Cardiovascular:  Negative for chest pain and palpitations.  Gastrointestinal:  Negative for abdominal pain, constipation, diarrhea, nausea and vomiting.  Endocrine: Negative for cold intolerance, heat intolerance, polydipsia and polyuria.  Genitourinary:  Negative for dyspareunia, dysuria, flank pain, frequency and urgency.  Musculoskeletal:  Negative for arthralgias, back pain and myalgias.  Skin:  Negative for rash.  Allergic/Immunologic: Negative for environmental allergies.  Neurological:  Positive for weakness. Negative for dizziness and headaches.       Memory difficulties   Hematological:  Negative for adenopathy.  Psychiatric/Behavioral:  Positive for confusion. The patient is nervous/anxious.      Objective     Today's Vitals   05/27/22 1415  BP: (!)  163/66  Pulse: 84  Temp: 97.7 F (36.5 C)  SpO2: 96%  Weight: 135 lb (61.2 kg)  Height: 5\' 4"  (1.626 m)   Body mass index is 23.17 kg/m.   Physical Exam Vitals and nursing note reviewed.  Constitutional:      Appearance: Normal appearance. She is well-developed.  HENT:     Head: Normocephalic and atraumatic.     Nose: Nose normal.     Mouth/Throat:     Mouth: Mucous membranes are moist.     Pharynx: Oropharynx is clear.  Eyes:     Extraocular Movements: Extraocular movements intact.     Conjunctiva/sclera: Conjunctivae normal.     Pupils: Pupils are equal, round, and reactive to light.  Neck:     Vascular: No carotid bruit.  Cardiovascular:     Rate and Rhythm: Normal rate and regular rhythm.     Pulses: Normal pulses.     Heart sounds: Normal heart sounds.  Pulmonary:     Effort: Pulmonary effort is normal.     Breath sounds: Normal breath sounds.  Abdominal:     General: Bowel sounds are normal. There is no distension.     Palpations: Abdomen is soft. There is no mass.     Tenderness: There is no abdominal tenderness. There is no guarding or rebound.     Hernia: No hernia is present.  Genitourinary:    Comments: Urine sample negative for infection or other abnormalities  Musculoskeletal:        General: Normal range of motion.     Cervical back: Normal range of motion and neck supple.  Lymphadenopathy:     Cervical: No cervical adenopathy.  Skin:    General: Skin is warm and dry.     Capillary Refill: Capillary refill takes less than 2 seconds.  Neurological:     General: No focal deficit present.     Mental Status: She is alert and oriented to person, place, and time. Mental status is at baseline.  Psychiatric:        Mood and Affect: Mood normal.        Behavior: Behavior normal.        Thought Content: Thought content normal.        Judgment: Judgment normal.     Results for orders placed or performed in visit on 05/27/22  POCT urinalysis dipstick   Result Value Ref Range   Color, UA     Clarity, UA     Glucose, UA Negative Negative   Bilirubin, UA neg    Ketones, UA neg    Spec Grav, UA 1.025 1.010 - 1.025   Blood, UA neg    pH, UA 5.5 5.0 - 8.0   Protein, UA Negative Negative   Urobilinogen, UA 0.2 0.2 or 1.0 E.U./dL   Nitrite, UA neg    Leukocytes, UA Negative Negative   Appearance     Odor      Assessment & Plan  1. Frequent UTI Review of the pelvis negative for infection or other abnormalities.  She should continue visits with urology scheduled. - POCT urinalysis dipstick  2. Urinary incontinence, unspecified type Patient recently started new medication for incontinence.  Follow-up with neurology.  3. Essential hypertension Stable.  Continue blood pressure medication as prescribed.  4. Delirium due to multiple etiologies Stable.  Continue regular visits with neurology as scheduled.  Return in about 3 months (around 08/27/2022) for medicare wellness, FBW a week prior to visit - this will need to be 40 minutes please .        Carlean Jews, NP  [facility] [phone] (phone) [phone] (fax)  [facility]

## 2022-05-29 ENCOUNTER — Emergency Department (HOSPITAL_COMMUNITY): Payer: Medicare Other

## 2022-05-29 ENCOUNTER — Other Ambulatory Visit: Payer: Self-pay

## 2022-05-29 ENCOUNTER — Observation Stay (HOSPITAL_COMMUNITY)
Admission: EM | Admit: 2022-05-29 | Discharge: 2022-05-30 | Disposition: A | Discharge status: Home Health | Payer: Medicare Other | Attending: Internal Medicine | Admitting: Internal Medicine

## 2022-05-29 ENCOUNTER — Encounter (HOSPITAL_COMMUNITY): Payer: Self-pay | Admitting: Emergency Medicine

## 2022-05-29 ENCOUNTER — Other Ambulatory Visit (HOSPITAL_COMMUNITY): Payer: Medicare Other

## 2022-05-29 DIAGNOSIS — E785 Hyperlipidemia, unspecified: Secondary | ICD-10-CM | POA: Diagnosis present

## 2022-05-29 DIAGNOSIS — M6281 Muscle weakness (generalized): Secondary | ICD-10-CM | POA: Diagnosis not present

## 2022-05-29 DIAGNOSIS — I10 Essential (primary) hypertension: Secondary | ICD-10-CM | POA: Diagnosis not present

## 2022-05-29 DIAGNOSIS — Z79899 Other long term (current) drug therapy: Secondary | ICD-10-CM | POA: Diagnosis not present

## 2022-05-29 DIAGNOSIS — Z8673 Personal history of transient ischemic attack (TIA), and cerebral infarction without residual deficits: Secondary | ICD-10-CM | POA: Diagnosis not present

## 2022-05-29 DIAGNOSIS — Z7901 Long term (current) use of anticoagulants: Secondary | ICD-10-CM | POA: Diagnosis not present

## 2022-05-29 DIAGNOSIS — E039 Hypothyroidism, unspecified: Secondary | ICD-10-CM | POA: Diagnosis not present

## 2022-05-29 DIAGNOSIS — Z85828 Personal history of other malignant neoplasm of skin: Secondary | ICD-10-CM | POA: Diagnosis not present

## 2022-05-29 DIAGNOSIS — N390 Urinary tract infection, site not specified: Secondary | ICD-10-CM | POA: Diagnosis present

## 2022-05-29 DIAGNOSIS — I639 Cerebral infarction, unspecified: Secondary | ICD-10-CM | POA: Diagnosis present

## 2022-05-29 DIAGNOSIS — R2681 Unsteadiness on feet: Secondary | ICD-10-CM | POA: Insufficient documentation

## 2022-05-29 DIAGNOSIS — R55 Syncope and collapse: Secondary | ICD-10-CM | POA: Diagnosis present

## 2022-05-29 LAB — CBC WITH DIFFERENTIAL/PLATELET
Abs Immature Granulocytes: 0.01 10*3/uL (ref 0.00–0.07)
Basophils Absolute: 0 10*3/uL (ref 0.0–0.1)
Basophils Relative: 0 %
Eosinophils Absolute: 0.1 10*3/uL (ref 0.0–0.5)
Eosinophils Relative: 2 %
HCT: 38.7 % (ref 36.0–46.0)
Hemoglobin: 12.5 g/dL (ref 12.0–15.0)
Immature Granulocytes: 0 %
Lymphocytes Relative: 16 %
Lymphs Abs: 1.1 10*3/uL (ref 0.7–4.0)
MCH: 30.9 pg (ref 26.0–34.0)
MCHC: 32.3 g/dL (ref 30.0–36.0)
MCV: 95.8 fL (ref 80.0–100.0)
Monocytes Absolute: 0.5 10*3/uL (ref 0.1–1.0)
Monocytes Relative: 7 %
Neutro Abs: 5 10*3/uL (ref 1.7–7.7)
Neutrophils Relative %: 75 %
Platelets: 229 10*3/uL (ref 150–400)
RBC: 4.04 MIL/uL (ref 3.87–5.11)
RDW: 16.5 % — ABNORMAL HIGH (ref 11.5–15.5)
WBC: 6.7 10*3/uL (ref 4.0–10.5)
nRBC: 0 % (ref 0.0–0.2)

## 2022-05-29 LAB — VITAMIN B12: Vitamin B-12: 1397 pg/mL — ABNORMAL HIGH (ref 180–914)

## 2022-05-29 LAB — PROTIME-INR
INR: 1.5 — ABNORMAL HIGH (ref 0.8–1.2)
Prothrombin Time: 17.8 seconds — ABNORMAL HIGH (ref 11.4–15.2)

## 2022-05-29 LAB — COMPREHENSIVE METABOLIC PANEL
ALT: 15 U/L (ref 0–44)
AST: 27 U/L (ref 15–41)
Albumin: 3.4 g/dL — ABNORMAL LOW (ref 3.5–5.0)
Alkaline Phosphatase: 66 U/L (ref 38–126)
Anion gap: 7 (ref 5–15)
BUN: 18 mg/dL (ref 8–23)
CO2: 25 mmol/L (ref 22–32)
Calcium: 8.9 mg/dL (ref 8.9–10.3)
Chloride: 106 mmol/L (ref 98–111)
Creatinine, Ser: 0.85 mg/dL (ref 0.44–1.00)
GFR, Estimated: >60 mL/min (ref >60)
Glucose, Bld: 122 mg/dL — ABNORMAL HIGH (ref 70–99)
Potassium: 3.9 mmol/L (ref 3.5–5.1)
Sodium: 138 mmol/L (ref 135–145)
Total Bilirubin: 0.5 mg/dL (ref 0.3–1.2)
Total Protein: 6.1 g/dL — ABNORMAL LOW (ref 6.5–8.1)

## 2022-05-29 LAB — URINALYSIS, ROUTINE W REFLEX MICROSCOPIC
Bilirubin Urine: NEGATIVE
Glucose, UA: NEGATIVE mg/dL
Hgb urine dipstick: NEGATIVE
Ketones, ur: NEGATIVE mg/dL
Leukocytes,Ua: NEGATIVE
Nitrite: NEGATIVE
Protein, ur: NEGATIVE mg/dL
Specific Gravity, Urine: 1.015 (ref 1.005–1.030)
pH: 5 (ref 5.0–8.0)

## 2022-05-29 LAB — TROPONIN I (HIGH SENSITIVITY)
Troponin I (High Sensitivity): 5 ng/L (ref <18)
Troponin I (High Sensitivity): 7 ng/L (ref <18)

## 2022-05-29 LAB — AMMONIA: Ammonia: 23 umol/L (ref 9–35)

## 2022-05-29 LAB — TSH: TSH: 1.255 u[IU]/mL (ref 0.350–4.500)

## 2022-05-29 MED ORDER — ONDANSETRON HCL 4 MG/2ML IJ SOLN: 4.0000 mg | Freq: Four times a day (QID) | INTRAMUSCULAR | Status: DC | PRN

## 2022-05-29 MED ORDER — ACETAMINOPHEN 325 MG PO TABS
650.0000 mg | ORAL_TABLET | Freq: Four times a day (QID) | ORAL | Status: DC | PRN
  Administered 2022-05-30: 650 mg via ORAL
  Filled 2022-05-29: qty 2

## 2022-05-29 MED ORDER — SENNOSIDES-DOCUSATE SODIUM 8.6-50 MG PO TABS: 1.0000 | ORAL_TABLET | Freq: Every evening | ORAL | Status: DC | PRN

## 2022-05-29 MED ORDER — SODIUM CHLORIDE 0.9 % IV SOLN: INTRAVENOUS | Status: AC

## 2022-05-29 MED ORDER — ACETAMINOPHEN 650 MG RE SUPP: 650.0000 mg | Freq: Four times a day (QID) | RECTAL | Status: DC | PRN

## 2022-05-29 MED ORDER — SODIUM CHLORIDE 0.9% FLUSH
3.0000 mL | Freq: Two times a day (BID) | INTRAVENOUS | Status: DC
  Administered 2022-05-29 – 2022-05-30 (×3): 3 mL via INTRAVENOUS

## 2022-05-29 MED ORDER — ONDANSETRON HCL 4 MG/2ML IJ SOLN
4.0000 mg | Freq: Once | INTRAMUSCULAR | Status: DC
Start: 2022-05-29 — End: 2022-05-30
  Filled 2022-05-29: qty 2

## 2022-05-29 MED ORDER — ONDANSETRON HCL 4 MG PO TABS: 4.0000 mg | ORAL_TABLET | Freq: Four times a day (QID) | ORAL | Status: DC | PRN

## 2022-05-29 MED ORDER — APIXABAN 5 MG PO TABS
5.0000 mg | ORAL_TABLET | Freq: Two times a day (BID) | ORAL | Status: DC
  Administered 2022-05-29 – 2022-05-30 (×2): 5 mg via ORAL
  Filled 2022-05-29 (×2): qty 1

## 2022-05-29 NOTE — Progress Notes (Signed)
Ok to wait for EEG until tomorrow approved by DR. Aroroa  

## 2022-05-29 NOTE — ED Provider Notes (Signed)
[facility] EMERGENCY DEPARTMENT Provider Note   CSN: [id number] Arrival date & time: 05/29/22  7510     History  Chief Complaint  Patient presents with   Near Syncope    Caroline [name] is a 86 y.o. female.  EMS reports pt passed out after eating breakfast.  Pt had sweating and vomiting afterwards.  Pt did not strike her head.  Pt complains of a headache.  Pt is on eliquis.  Pt denies fever or chills.  No abdominal pain  The history is provided by the patient. No language interpreter was used.  Near Syncope This is a new problem. The current episode started less than 1 hour ago. The problem occurs constantly. The problem has been gradually worsening. Nothing aggravates the symptoms. Nothing relieves the symptoms. She has tried nothing for the symptoms. The treatment provided no relief.       Home Medications Prior to Admission medications   Medication Sig Start Date End Date Taking? Authorizing Provider  acetaminophen (TYLENOL) 325 MG tablet Take 2 tablets (650 mg total) by mouth every 6 (six) hours as needed for mild pain, moderate pain, fever or headache. 07/02/21   Angiulli, Mcarthur Rossetti, PA-C  Ascorbic Acid (VITAMIN C) 1000 MG tablet Take 1,000 mg by mouth daily.    [provider]  atorvastatin (LIPITOR) 80 MG tablet TAKE 1 TABLET (80 MG TOTAL) BY MOUTH AT BEDTIME. 04/29/22   Mayer Masker, PA-C  Catheters MISC Use as directed. 1 box of inserts and 1 box canisters. 08/04/21   Mayer Masker, PA-C  Cholecalciferol (VITAMIN D-3) 25 MCG (1000 UT) CAPS Take 1 capsule (1,000 Units total) by mouth daily. 07/02/21   Angiulli, Mcarthur Rossetti, PA-C  Coenzyme Q10 (COQ10 PO) Take 1 tablet by mouth daily.    [provider]  donepezil (ARICEPT) 10 MG tablet Take 1 tablet (10 mg total) by mouth daily. 02/10/22   Micki Riley, MD  ELIQUIS 5 MG TABS tablet TAKE 1 TABLET BY MOUTH 2 TIMES DAILY. 04/26/22   Carlean Jews, NP  furosemide (LASIX) 20 MG tablet Take 1  tablet (20 mg total) by mouth daily as needed for edema. 07/28/21   Abonza, Maritza, PA-C  irbesartan (AVAPRO) 75 MG tablet TAKE 1 TABLET BY MOUTH IN THE MORNING AND 1/2 TABLET IN THE EVENING. 04/26/22   Carlean Jews, NP  levothyroxine (SYNTHROID) 125 MCG tablet TAKE 1 TABLET (125 MCG TOTAL) BY MOUTH DAILY AT 6 AM. 04/26/22   Carlean Jews, NP  meclizine (ANTIVERT) 25 MG tablet Take 0.5 tablets (12.5 mg total) by mouth 3 (three) times daily as needed for dizziness. 12/04/21   Cristopher Peru, PA-C  nitrofurantoin (MACRODANTIN) 50 MG capsule Take 1 capsule (50 mg total) by mouth daily. 01/20/22   Carlean Jews, NP  ondansetron (ZOFRAN) 4 MG tablet Take 1 tablet (4 mg total) by mouth every 6 (six) hours. 12/04/21   Cristopher Peru, PA-C  pantoprazole (PROTONIX) 40 MG tablet TAKE 1 TABLET BY MOUTH DAILY 04/26/22   Carlean Jews, NP  QUEtiapine (SEROQUEL) 25 MG tablet TAKE 1 TABLET (25 MG TOTAL) BY MOUTH AT BEDTIME. 01/29/22   Boscia, Heather E, NP  Vibegron (GEMTESA) 75 MG TABS Take 75 mg by mouth daily. 12/11/21   Stoioff, Verna Czech, MD  vitamin B-12 (CYANOCOBALAMIN) 250 MCG tablet Take 1 tablet (250 mcg total) by mouth daily. 07/02/21   Angiulli, Mcarthur Rossetti, PA-C  VITAMIN D PO Take 250 mg by  mouth.    [provider]      Allergies    Gluten meal, Wheat bran, Adhesive [tape], Codeine, Other, and Penicillins    Review of Systems   Review of Systems  Cardiovascular:  Positive for near-syncope.  Gastrointestinal:  Positive for nausea and vomiting.  All other systems reviewed and are negative.   Physical Exam Updated Vital Signs BP 135/64   Pulse 67   Temp (!) 97.3 F (36.3 C) (Axillary)   Resp 20   SpO2 100%  Physical Exam Vitals reviewed.  HENT:     Head: Normocephalic.  Eyes:     Extraocular Movements: Extraocular movements intact.     Pupils: Pupils are equal, round, and reactive to light.  Cardiovascular:     Rate and Rhythm: Normal rate and regular rhythm.      Heart sounds: Normal heart sounds.  Pulmonary:     Effort: Pulmonary effort is normal.     Breath sounds: Normal breath sounds.  Abdominal:     General: Abdomen is flat.  Musculoskeletal:        General: Normal range of motion.  Skin:    General: Skin is warm.  Neurological:     General: No focal deficit present.     Mental Status: She is alert.  Psychiatric:        Mood and Affect: Mood normal.    ED Results / Procedures / Treatments   Labs (all labs ordered are listed, but only abnormal results are displayed) Labs Reviewed  CBC WITH DIFFERENTIAL/PLATELET - Abnormal; Notable for the following components:      Result Value   RDW 16.5 (*)    All other components within normal limits  COMPREHENSIVE METABOLIC PANEL - Abnormal; Notable for the following components:   Glucose, Bld 122 (*)    Total Protein 6.1 (*)    Albumin 3.4 (*)    All other components within normal limits  PROTIME-INR - Abnormal; Notable for the following components:   Prothrombin Time 17.8 (*)    INR 1.5 (*)    All other components within normal limits  URINALYSIS, ROUTINE W REFLEX MICROSCOPIC  TROPONIN I (HIGH SENSITIVITY)  TROPONIN I (HIGH SENSITIVITY)    EKG None  Radiology DG Chest Port 1 View  Result Date: 05/29/2022 CLINICAL DATA:  Syncope. EXAM: PORTABLE CHEST 1 VIEW COMPARISON:  07/04/2021 chest radiograph, 07/17/2021 chest CT and prior studies FINDINGS: The cardiomediastinal silhouette is unchanged. Mild bibasilar atelectasis/scarring again noted. No focal airspace disease, pneumothorax, large pleural effusion or acute bony abnormality identified. IMPRESSION: No evidence of acute cardiopulmonary disease. Mild bibasilar atelectasis/scarring again noted. Electronically Signed   By: Harmon Pier M.D.   On: 05/29/2022 11:01   CT Head Wo Contrast  Result Date: 05/29/2022 CLINICAL DATA:  Syncope/presyncope EXAM: CT HEAD WITHOUT CONTRAST TECHNIQUE: Contiguous axial images were obtained from the base  of the skull through the vertex without intravenous contrast. RADIATION DOSE REDUCTION: This exam was performed according to the departmental dose-optimization program which includes automated exposure control, adjustment of the mA and/or kV according to patient size and/or use of iterative reconstruction technique. COMPARISON:  12/04/2021 FINDINGS: Brain: No evidence of acute infarction, hemorrhage, hydrocephalus, extra-axial collection or mass lesion/mass effect. Remote left occipito temporal infarct, PCA distribution. Confluent chronic small vessel ischemia in the cerebral white matter. Chronic lacunar infarct in the right thalamus. Brain atrophy with ventriculomegaly. Vascular: No hyperdense vessel or unexpected calcification. Skull: Normal. Negative for fracture or focal lesion. Sinuses/Orbits: Bilateral  cataract resection IMPRESSION: 1. No acute finding. 2. Chronic small vessel ischemia and remote left PCA infarct. Electronically Signed   By: Tiburcio Pea M.D.   On: 05/29/2022 10:40    Procedures Procedures    Medications Ordered in ED Medications  ondansetron (ZOFRAN) injection 4 mg (0 mg Intravenous Hold 05/29/22 1045)    ED Course/ Medical Decision Making/ A&P                           Medical Decision Making EMS report pt had an episode of syncope.    Amount and/or Complexity of Data Reviewed Independent Historian: spouse    Details: Pt's husband reports pt was strring off and not responding.  He thought pt was having a seizure or  a stroke.  He reports pt has had a previous cva.  External Data Reviewed: notes.    Details: Primary care notes reviewed.   Labs: ordered. Decision-making details documented in ED Course.    Details: Labs ordered reviewed and interpreted.  Glucose is 122  Radiology: ordered and independent interpretation performed. Decision-making details documented in ED Course.    Details: Chest xray  no acute change. Ct head  no acute finding  small vessel  disease ECG/medicine tests: ordered and independent interpretation performed. Decision-making details documented in ED Course.    Details: EKG  no acute changes  Discussion of management or test interpretation with external provider(s): I spoke to Hospitalist who will admit.  Hospitalist request Orthostatics  Risk Prescription drug management.           Final Clinical Impression(s) / ED Diagnoses Final diagnoses:  Syncope and collapse    Rx / DC Orders ED Discharge Orders     None         Osie Cheeks 05/29/22 1419    Wynetta Fines, MD 05/29/22 [phone]

## 2022-05-29 NOTE — ED Triage Notes (Signed)
Patient BIB [facility] from home after having a syncopal episode after eating breakfast. Patient became diaphoretic, nauseated and had one episode of emesis. Pt complaining of 8/10 headache. Pt did not hit her head, but is on eliquis. Patient with hx of stroke last year w/ short term memory loss.  Patient also complaining of R eye pain but this has been ongoing for "a couple of weeks".   VSS  [id number].

## 2022-05-29 NOTE — Hospital Course (Addendum)
86 year old female with history of chronic UTIs, hypothyroidism, HTN, HLD,hypertension, history of stroke with short-term memory loss, delerium presents to the ED after having a syncopal episode.  After having breakfast patient passed out, had sweating and vomiting afterwards. As per husband she voiced she was tired, he suggested laying down and when he looked back on her she was laying back on chair staring in ceiling, some movements- mild on extremities. She did respond to his voice- EMS was called and her bp was in 100s, sugar was 166. started staring off, did not fall or had injury,he thought she had stroke or seizure. She was recently started on Gemtesa for incontinence for 10 days Patient otherwise denies any nausea, vomiting, chest pain, shortness of breath, fever, chills, headache, focal weakness, numbness tingling, speech difficulties  In the ED, hemodynamically stable BP in 130s to 170s, on room air, afebrile, labs with stable CMP CBC troponin 5> 7, EKG sinus rhythm no acute ST-T wave changes, cxr mild bibasilar atelectasis/scarring, CT head chronic small vessel ischemia and remote left PCA infarct. UA unremarkable. Zofran was ordered admission was requested for further management. Orthostatic vitals ordered in ED.

## 2022-05-29 NOTE — H&P (Signed)
History and Physical    Caroline [name] FWY:[id number] DOB: 02-19-1936 DOA: 05/29/2022  PCP: Carlean Jews, NP   Patient coming from:   Chief Complaint  Patient presents with   Near Syncope      HPI:86 year old female with history of chronic UTIs, hypothyroidism, HTN, HLD,hypertension, history of stroke with short-term memory loss, delerium presents to the ED after having a syncopal episode.  After having breakfast patient passed out, had sweating and vomiting afterwards. As per husband she voiced she was tired, he suggested laying down and when he looked back on her she was laying back on chair staring in ceiling, some movements- mild on extremities. She did respond to his voice- EMS was called and her bp was in 100s, sugar was 166. started staring off, did not fall or had injury,he thought she had stroke or seizure. She was recently started on Gemtesa for incontinence for 10 days Patient otherwise denies any nausea, vomiting, chest pain, shortness of breath, fever, chills, headache, focal weakness, numbness tingling, speech difficulties  In the ED, hemodynamically stable BP in 130s to 170s, on room air, afebrile, labs with stable CMP CBC troponin 5> 7, EKG sinus rhythm no acute ST-T wave changes, cxr mild bibasilar atelectasis/scarring, CT head chronic small vessel ischemia and remote left PCA infarct. UA unremarkable. Zofran was ordered admission was requested for further management. Orthostatic vitals ordered in ED.  Assessment/Plan Principal Problem:   Syncope Active Problems:   Hypothyroidism   Chronic urinary tract infection   CVA (cerebral vascular accident) (HCC)   Benign essential HTN   Dyslipidemia  Syncope: Possible syncopal episode.  Patient was slumped on the chair staring but no fall,?  LOC.  CT head no acute finding, nonfocal on exam.  Currently back to baseline.  Patient able to recall.  Orthostatic vitals pending, gently hydrate, check EEG, echo, PT OT evaluation.  Check  TSH, ammonia  Hypothyroidism: Check TSH.  Resume Synthroid after med rec Chronic urinary tract infection: Recently placed on Gemtesa, has urology follow-up.  CVA hx: Short-term memory loss: Nonfocal on exam.  Baseline mental status.  We will resume her home meds  Benign essential HTN: BP stable in the ED hold meds  Dyslipidemia: Resume home meds once med rec done  Severity of Illness: The appropriate patient status for this patient is OBSERVATION. Observation status is judged to be reasonable and necessary in order to provide the required intensity of service to ensure the patient's safety. The patient's presenting symptoms, physical exam findings, and initial radiographic and laboratory data in the context of their medical condition is felt to place them at decreased risk for further clinical deterioration. Furthermore, it is anticipated that the patient will be medically stable for discharge from the hospital within 2 midnights of admission.    DVT prophylaxis: apixaban (ELIQUIS) tablet 5 mg   Code Status:   Code Status: Full Code  Family Communication: Admission, patients condition and plan of care including tests being ordered have been discussed with the patient and husband who indicate understanding and agree with the plan and Code Status.  Consults called:  None  Review of Systems: All systems were reviewed and were negative except as mentioned in HPI above. 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 Cheek   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   Bridge Left Nose, and Left Neck   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 Cheek Tx: curret x 3 and 5FU   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   Bridge of nose tx Cx3 and 5FU, Left neck Tx Cx3 and 5FU   Thrombophlebitis     Past Surgical History:  Procedure Laterality Date   ABDOMINAL HYSTERECTOMY  1979   CATARACT EXTRACTION, BILATERAL     knee surgery Left    VARICOSE VEIN SURGERY Left    XI ROBOTIC ASSISTED PARAESOPHAGEAL HERNIA REPAIR N/A 06/15/2021   Procedure: XI ROBOTIC ASSISTED PARAESOPHAGEAL HERNIA REPAIR, GASTROPEXY, UPPER ENDOSCOPY;  Surgeon: Berna Bue, MD;  Location: [facility];  Service: General;  Laterality: N/A;     reports that she has never smoked. She has never used smokeless tobacco. She reports that she does not currently use drugs. She reports that she does not drink alcohol.  Allergies  Allergen Reactions   Gluten Meal Nausea And Vomiting    Patient has CELIAC DISEASE   Wheat Bran Nausea And Vomiting    Patient has CELIAC DISEASE   Adhesive [Tape] Other (See Comments)    Patient's skin is VERY THIN and tears very easily; please use either paper tape or Coban wrap   Codeine Nausea And Vomiting   Other Other (See Comments)    Patient has Hypoglycemia; her husband stated "when her sugar crashes, it crashes hard."   Penicillins Other (See Comments)    Doesn't remember reaction , but denies any SOB/Swelling States " it was a long time ago "     Family History  Problem Relation Age of Onset   Arthritis Mother        pt denies   Hypertension Mother        pt denies   Obesity Sister    Cancer Maternal Uncle        type unknown   Colon cancer Neg Hx    Stomach cancer Neg Hx    Esophageal cancer Neg Hx    Pancreatic cancer Neg Hx      Prior to Admission  medications   Medication Sig Start Date End Date Taking? Authorizing Provider  acetaminophen (TYLENOL) 325 MG tablet Take 2 tablets (650 mg total) by mouth every 6 (six) hours as needed for mild pain, moderate pain, fever or headache. 07/02/21   Angiulli, Mcarthur Rossetti, PA-C  Ascorbic Acid (VITAMIN C) 1000 MG tablet Take 1,000 mg by mouth daily.    [provider]  atorvastatin (LIPITOR) 80 MG tablet TAKE 1 TABLET (80 MG TOTAL) BY MOUTH AT BEDTIME. 04/29/22   Mayer Masker, PA-C  Catheters MISC Use as directed. 1 box of inserts and 1 box canisters. 08/04/21   Mayer Masker, PA-C  Cholecalciferol (VITAMIN D-3) 25 MCG (1000 UT) CAPS Take 1 capsule (1,000 Units total) by mouth daily. 07/02/21   Angiulli, Mcarthur Rossetti, PA-C  Coenzyme Q10 (COQ10 PO) Take  1 tablet by mouth daily.    [provider]  donepezil (ARICEPT) 10 MG tablet Take 1 tablet (10 mg total) by mouth daily. 02/10/22   Micki Riley, MD  ELIQUIS 5 MG TABS tablet TAKE 1 TABLET BY MOUTH 2 TIMES DAILY. 04/26/22   Carlean Jews, NP  furosemide (LASIX) 20 MG tablet Take 1 tablet (20 mg total) by mouth daily as needed for edema. 07/28/21   Abonza, Maritza, PA-C  irbesartan (AVAPRO) 75 MG tablet TAKE 1 TABLET BY MOUTH IN THE MORNING AND 1/2 TABLET IN THE EVENING. 04/26/22   Carlean Jews, NP  levothyroxine (SYNTHROID) 125 MCG tablet TAKE 1 TABLET (125 MCG TOTAL) BY MOUTH DAILY AT 6 AM. 04/26/22   Carlean Jews, NP  meclizine (ANTIVERT) 25 MG tablet Take 0.5 tablets (12.5 mg total) by mouth 3 (three) times daily as needed for dizziness. 12/04/21   Cristopher Peru, PA-C  nitrofurantoin (MACRODANTIN) 50 MG capsule Take 1 capsule (50 mg total) by mouth daily. 01/20/22   Carlean Jews, NP  ondansetron (ZOFRAN) 4 MG tablet Take 1 tablet (4 mg total) by mouth every 6 (six) hours. 12/04/21   Cristopher Peru, PA-C  pantoprazole (PROTONIX) 40 MG tablet TAKE 1 TABLET BY MOUTH DAILY 04/26/22   Carlean Jews, NP  QUEtiapine (SEROQUEL) 25  MG tablet TAKE 1 TABLET (25 MG TOTAL) BY MOUTH AT BEDTIME. 01/29/22   Boscia, Heather E, NP  Vibegron (GEMTESA) 75 MG TABS Take 75 mg by mouth daily. 12/11/21   Stoioff, Verna Czech, MD  vitamin B-12 (CYANOCOBALAMIN) 250 MCG tablet Take 1 tablet (250 mcg total) by mouth daily. 07/02/21   Angiulli, Mcarthur Rossetti, PA-C  VITAMIN D PO Take 250 mg by mouth.    [provider]    Physical Exam: Vitals:   05/29/22 1045 05/29/22 1046 05/29/22 1100 05/29/22 1230  BP: (!) 163/79  (!) 170/72 135/64  Pulse: 88  75 67  Resp: (!) 21  19 20   Temp:      TempSrc:      SpO2: 99% 100% 99% 100%    General exam: AAOx3, NAD, weak appearing. HEENT:Oral mucosa moist, Ear/Nose WNL grossly, dentition normal. Respiratory system: bilaterally clear,no wheezing or crackles,no use of accessory muscle Cardiovascular system: S1 & S2 +, No JVD,. Gastrointestinal system: Abdomen soft, NT,ND, BS+ Nervous System:Alert, awake, moving extremities and grossly nonfocal Extremities: No edema, distal peripheral pulses palpable.  Skin: No rashes,no icterus. MSK: Normal muscle bulk,tone, power  Labs on Admission: I have personally reviewed following labs and imaging studies  CBC: Recent Labs  Lab 05/29/22 1031  WBC 6.7  NEUTROABS 5.0  HGB 12.5  HCT 38.7  MCV 95.8  PLT 229   Basic Metabolic Panel: Recent Labs  Lab 05/29/22 1031  NA 138  K 3.9  CL 106  CO2 25  GLUCOSE 122*  BUN 18  CREATININE 0.85  CALCIUM 8.9   GFR: Estimated Creatinine Clearance: 42.5 mL/min (by C-G formula based on SCr of 0.85 mg/dL). Liver Function Tests: Recent Labs  Lab 05/29/22 1031  AST 27  ALT 15  ALKPHOS 66  BILITOT 0.5  PROT 6.1*  ALBUMIN 3.4*   No results for input(s): "LIPASE", "AMYLASE" in the last 168 hours. No results for input(s): "AMMONIA" in the last 168 hours. Coagulation Profile: Recent Labs  Lab 05/29/22 1031  INR 1.5*   Cardiac Enzymes: No results for input(s): "CKTOTAL", "CKMB", "CKMBINDEX",  "TROPONINI" in the last 168 hours. BNP (  last 3 results) No results for input(s): "PROBNP" in the last 8760 hours. HbA1C: No results for input(s): "HGBA1C" in the last 72 hours. CBG: No results for input(s): "GLUCAP" in the last 168 hours. Lipid Profile: No results for input(s): "CHOL", "HDL", "LDLCALC", "TRIG", "CHOLHDL", "LDLDIRECT" in the last 72 hours. Thyroid Function Tests: No results for input(s): "TSH", "T4TOTAL", "FREET4", "T3FREE", "THYROIDAB" in the last 72 hours. Anemia Panel: No results for input(s): "VITAMINB12", "FOLATE", "FERRITIN", "TIBC", "IRON", "RETICCTPCT" in the last 72 hours. Urine analysis:    Component Value Date/Time   COLORURINE YELLOW 05/29/2022 1318   APPEARANCEUR HAZY (A) 05/29/2022 1318   APPEARANCEUR Cloudy (A) 12/11/2021 1427   LABSPEC 1.015 05/29/2022 1318   PHURINE 5.0 05/29/2022 1318   GLUCOSEU NEGATIVE 05/29/2022 1318   HGBUR NEGATIVE 05/29/2022 1318   BILIRUBINUR NEGATIVE 05/29/2022 1318   BILIRUBINUR neg 05/27/2022 1418   BILIRUBINUR Negative 12/11/2021 1427   KETONESUR NEGATIVE 05/29/2022 1318   PROTEINUR NEGATIVE 05/29/2022 1318   UROBILINOGEN 0.2 05/27/2022 1418   NITRITE NEGATIVE 05/29/2022 1318   LEUKOCYTESUR NEGATIVE 05/29/2022 1318    Radiological Exams on Admission: DG Chest Port 1 View  Result Date: 05/29/2022 CLINICAL DATA:  Syncope. EXAM: PORTABLE CHEST 1 VIEW COMPARISON:  07/04/2021 chest radiograph, 07/17/2021 chest CT and prior studies FINDINGS: The cardiomediastinal silhouette is unchanged. Mild bibasilar atelectasis/scarring again noted. No focal airspace disease, pneumothorax, large pleural effusion or acute bony abnormality identified. IMPRESSION: No evidence of acute cardiopulmonary disease. Mild bibasilar atelectasis/scarring again noted. Electronically Signed   By: Harmon Pier M.D.   On: 05/29/2022 11:01   CT Head Wo Contrast  Result Date: 05/29/2022 CLINICAL DATA:  Syncope/presyncope EXAM: CT HEAD WITHOUT CONTRAST  TECHNIQUE: Contiguous axial images were obtained from the base of the skull through the vertex without intravenous contrast. RADIATION DOSE REDUCTION: This exam was performed according to the departmental dose-optimization program which includes automated exposure control, adjustment of the mA and/or kV according to patient size and/or use of iterative reconstruction technique. COMPARISON:  12/04/2021 FINDINGS: Brain: No evidence of acute infarction, hemorrhage, hydrocephalus, extra-axial collection or mass lesion/mass effect. Remote left occipito temporal infarct, PCA distribution. Confluent chronic small vessel ischemia in the cerebral white matter. Chronic lacunar infarct in the right thalamus. Brain atrophy with ventriculomegaly. Vascular: No hyperdense vessel or unexpected calcification. Skull: Normal. Negative for fracture or focal lesion. Sinuses/Orbits: Bilateral cataract resection IMPRESSION: 1. No acute finding. 2. Chronic small vessel ischemia and remote left PCA infarct. Electronically Signed   By: Tiburcio Pea M.D.   On: 05/29/2022 10:40    Lanae Boast MD [facility]  If 7PM-7AM, please contact night-coverage [email or web address]  05/29/2022, 2:52 PM

## 2022-05-29 NOTE — ED Notes (Signed)
Bed alarm placed.  

## 2022-05-30 ENCOUNTER — Observation Stay (HOSPITAL_BASED_OUTPATIENT_CLINIC_OR_DEPARTMENT_OTHER): Payer: Medicare Other

## 2022-05-30 ENCOUNTER — Observation Stay (HOSPITAL_COMMUNITY): Payer: Medicare Other

## 2022-05-30 DIAGNOSIS — R55 Syncope and collapse: Secondary | ICD-10-CM

## 2022-05-30 LAB — GLUCOSE, CAPILLARY: Glucose-Capillary: 93 mg/dL (ref 70–99)

## 2022-05-30 LAB — ECHOCARDIOGRAM COMPLETE
Area-P 1/2: 3.31 cm2
Height: 64 in
P 1/2 time: 508 msec
S' Lateral: 2.7 cm
Weight: 2338.64 oz

## 2022-05-30 LAB — BASIC METABOLIC PANEL
Anion gap: 8 (ref 5–15)
BUN: 18 mg/dL (ref 8–23)
CO2: 24 mmol/L (ref 22–32)
Calcium: 8.7 mg/dL — ABNORMAL LOW (ref 8.9–10.3)
Chloride: 107 mmol/L (ref 98–111)
Creatinine, Ser: 0.79 mg/dL (ref 0.44–1.00)
GFR, Estimated: >60 mL/min (ref >60)
Glucose, Bld: 91 mg/dL (ref 70–99)
Potassium: 3.8 mmol/L (ref 3.5–5.1)
Sodium: 139 mmol/L (ref 135–145)

## 2022-05-30 MED ORDER — MIRABEGRON ER 25 MG PO TB24
25.0000 mg | ORAL_TABLET | Freq: Every day | ORAL | Status: DC
  Administered 2022-05-30: 25 mg via ORAL
  Filled 2022-05-30: qty 1

## 2022-05-30 MED ORDER — QUETIAPINE FUMARATE 25 MG PO TABS: 25.0000 mg | ORAL_TABLET | Freq: Every day | ORAL | Status: DC

## 2022-05-30 MED ORDER — IRBESARTAN 150 MG PO TABS
75.0000 mg | ORAL_TABLET | Freq: Every day | ORAL | Status: DC
  Administered 2022-05-30: 75 mg via ORAL
  Filled 2022-05-30: qty 1

## 2022-05-30 MED ORDER — FUROSEMIDE 20 MG PO TABS: 20.0000 mg | ORAL_TABLET | Freq: Every day | ORAL | Status: DC | PRN

## 2022-05-30 MED ORDER — NITROFURANTOIN MACROCRYSTAL 50 MG PO CAPS
50.0000 mg | ORAL_CAPSULE | Freq: Every day | ORAL | Status: DC
  Administered 2022-05-30: 50 mg via ORAL
  Filled 2022-05-30: qty 1

## 2022-05-30 MED ORDER — DONEPEZIL HCL 5 MG PO TABS
10.0000 mg | ORAL_TABLET | Freq: Every day | ORAL | Status: DC
  Administered 2022-05-30: 10 mg via ORAL
  Filled 2022-05-30: qty 2

## 2022-05-30 MED ORDER — ATORVASTATIN CALCIUM 80 MG PO TABS: 80.0000 mg | ORAL_TABLET | Freq: Every day | ORAL | Status: DC

## 2022-05-30 MED ORDER — SODIUM CHLORIDE 0.9 % IV SOLN: INTRAVENOUS | Status: DC

## 2022-05-30 MED ORDER — PANTOPRAZOLE SODIUM 40 MG PO TBEC
40.0000 mg | DELAYED_RELEASE_TABLET | Freq: Every day | ORAL | Status: DC
  Administered 2022-05-30: 40 mg via ORAL
  Filled 2022-05-30: qty 1

## 2022-05-30 MED ORDER — CYANOCOBALAMIN 500 MCG PO TABS
250.0000 ug | ORAL_TABLET | Freq: Every day | ORAL | Status: DC
  Administered 2022-05-30: 250 ug via ORAL
  Filled 2022-05-30: qty 1

## 2022-05-30 MED ORDER — LEVOTHYROXINE SODIUM 25 MCG PO TABS: 125.0000 ug | ORAL_TABLET | Freq: Every day | ORAL | Status: DC

## 2022-05-30 MED ORDER — MECLIZINE HCL 12.5 MG PO TABS: 12.5000 mg | ORAL_TABLET | Freq: Three times a day (TID) | ORAL | Status: DC | PRN

## 2022-05-30 MED ORDER — VITAMIN D 25 MCG (1000 UNIT) PO TABS: 1000.0000 [IU] | ORAL_TABLET | Freq: Every day | ORAL | Status: DC

## 2022-05-30 NOTE — Procedures (Signed)
Patient Name: Caroline [name]  MRN: [id number]  Epilepsy Attending: Charlsie Quest  Referring Physician/Provider: Lanae Boast, MD  Date: 05/30/2022 Duration: 24.07 mins  Patient history: 86yo F with syncope. EEG to evaluate for seizure  Level of alertness: Awake  AEDs during EEG study: None  Technical aspects: This EEG study was done with scalp electrodes positioned according to the 10-20 International system of electrode placement. Electrical activity was reviewed with band pass filter of 1-70Hz , sensitivity of 7 uV/mm, display speed of 41mm/sec with a 60Hz  notched filter applied as appropriate. EEG data were recorded continuously and digitally stored.  Video monitoring was available and reviewed as appropriate.  Description: The posterior dominant rhythm consists of 8-9 Hz activity of moderate voltage (25-35 uV) seen predominantly in posterior head regions, symmetric and reactive to eye opening and eye closing.   IMPRESSION: This study is within normal limits. No seizures or epileptiform discharges were seen throughout the recording.  A normal interictal EEG does not exclude nor support the diagnosis of epilepsy.   [name] 

## 2022-05-30 NOTE — Progress Notes (Signed)
  Echocardiogram 2D Echocardiogram has been performed.  Caroline [name] 05/30/2022, 3:53 PM

## 2022-05-30 NOTE — Evaluation (Signed)
Physical Therapy Evaluation Patient Details Name: Caroline [name] MRN: [id number] DOB: 09-04-1936 Today's Date: 05/30/2022  History of Present Illness  Patient is a 86 y/o female who presents on 8/26 after syncopal episode at home. PMH includes CVA, DVT, HTN, scoliosis.  Clinical Impression  Patient presents with generalized weakness, cognitive deficits, impaired balance and impaired mobility s/p above. Pt lives at home with her spouse who still works, but has an Engineer, production come twice/week to assist with bathing and IADLs.  Today, pt requires Min A for bed mobility, transfers and gait training with use of RW for support. Noted to have visual deficits vs difficulty with directional cues needing assist for RW management and manual assist to find surfaces. Pt with cognitive deficits relating to memory, problem solving and following multi step commands. Orthostatic negative and no dizziness reported. Recommend initial supervision from spouse and HHPT at home maximize independence and mobility as well as decrease fall risk. Pt will likely do better in familiar home environment. Will follow acutely.    Recommendations for follow up therapy are one component of a multi-disciplinary discharge planning process, led by the attending physician.  Recommendations may be updated based on patient status, additional functional criteria and insurance authorization.  Follow Up Recommendations Home health PT      Assistance Recommended at Discharge Frequent or constant Supervision/Assistance  Patient can return home with the following  A little help with walking and/or transfers;A little help with bathing/dressing/bathroom;Direct supervision/assist for financial management;Help with stairs or ramp for entrance;Assist for transportation;Assistance with cooking/housework;Direct supervision/assist for medications management    Equipment Recommendations None recommended by PT  Recommendations for Other Services        Functional Status Assessment Patient has had a recent decline in their functional status and demonstrates the ability to make significant improvements in function in a reasonable and predictable amount of time.     Precautions / Restrictions Precautions Precautions: Fall Restrictions Weight Bearing Restrictions: No      Mobility  Bed Mobility Overal bed mobility: Needs Assistance Bed Mobility: Supine to Sit     Supine to sit: Min assist, HOB elevated     General bed mobility comments: ASsist with trunk to get to EOB. Increased time and difficulty scooting bottom to EOB but no assist needed.    Transfers Overall transfer level: Needs assistance Equipment used: Rolling walker (2 wheels) Transfers: Sit to/from Stand Sit to Stand: Min assist           General transfer comment: Min A to power to standing with use of RW, Stood from EOB x1, from toilet x1. transferred to chair post ambulation. Difficulty turning and problem solving how to get to chair and toilet without max cues and tactile assist.    Ambulation/Gait Ambulation/Gait assistance: Min assist, Min guard Gait Distance (Feet): 24 Feet (+ 32') Assistive device: Rolling walker (2 wheels) Gait Pattern/deviations: Trunk flexed, Step-to pattern, Step-through pattern, Decreased step length - right, Decreased step length - left, Wide base of support Gait velocity: decreased     General Gait Details: Slow, unsteady gait with flexed posture at hips/spine with RW too far anterior  Difficulty with directional cues and problem solving how to get to destination needing MIn A for RW management.  Stairs            Wheelchair Mobility    Modified Rankin (Stroke Patients Only)       Balance Overall balance assessment: Needs assistance Sitting-balance support: Feet supported, No upper extremity supported Sitting  balance-Leahy Scale: Fair     Standing balance support: During functional activity Standing  balance-Leahy Scale: Poor Standing balance comment: Requires Ue support in standing.                             Pertinent Vitals/Pain Pain Assessment Pain Assessment: No/denies pain    Home Living Family/patient expects to be discharged to:: Private residence Living Arrangements: Spouse/significant other Available Help at Discharge: Family;Available PRN/intermittently Type of Home: House Home Access: Stairs to enter Entrance Stairs-Rails: None Entrance Stairs-Number of Steps: 3 steps in front; 1 step in garage   Home Layout: Multi-level;Full bath on main level;Able to live on main level with bedroom/bathroom Home Equipment: BSC/3in1;Rolling Walker (2 wheels);Cane - single point      Prior Function Prior Level of Function : Needs assist       Physical Assist : ADLs (physical)   ADLs (physical): Bathing;IADLs Mobility Comments: Uses RW for ambulation, impaired vision right eye, no falls reported. Drives sometimes when she is not supposed too. ADLs Comments: has an aide come in twice/week to help with IADLs- cleaning, getting in/out of shower     Hand Dominance   Dominant Hand: Right    Extremity/Trunk Assessment   Upper Extremity Assessment Upper Extremity Assessment: Defer to OT evaluation    Lower Extremity Assessment Lower Extremity Assessment: Generalized weakness    Cervical / Trunk Assessment Cervical / Trunk Assessment: Kyphotic  Communication   Communication: No difficulties  Cognition Arousal/Alertness: Awake/alert Behavior During Therapy: WFL for tasks assessed/performed Overall Cognitive Status: Impaired/Different from baseline Area of Impairment: Memory, Following commands                     Memory: Decreased short-term memory Following Commands: Follows one step commands with increased time, Follows multi-step commands inconsistently       General Comments: Difficulty following multi step commands esp directional cues. Poor  memory. Kepy thinking it was 8 pm at night despite being told numerous times it was morning.        General Comments General comments (skin integrity, edema, etc.): Supine BP 151/65, HR 72 bpm, SItting BP 163/77, HR 87 bpm, Standing BP 157/77, HR 88 bpm. Asymptomatic.    Exercises     Assessment/Plan    PT Assessment Patient needs continued PT services  PT Problem List Decreased strength;Decreased mobility;Decreased safety awareness;Decreased balance;Decreased knowledge of use of DME;Decreased cognition       PT Treatment Interventions Therapeutic activities;DME instruction;Gait training;Therapeutic exercise;Stair training;Functional mobility training;Balance training;Patient/family education;Cognitive remediation    PT Goals (Current goals can be found in the Care Plan section)  Acute Rehab PT Goals Patient Stated Goal: to go home PT Goal Formulation: With patient Time For Goal Achievement: 06/13/22 Potential to Achieve Goals: Good    Frequency Min 3X/week     Co-evaluation               AM-PAC PT "6 Clicks" Mobility  Outcome Measure Help needed turning from your back to your side while in a flat bed without using bedrails?: A Little Help needed moving from lying on your back to sitting on the side of a flat bed without using bedrails?: A Little Help needed moving to and from a bed to a chair (including a wheelchair)?: A Little Help needed standing up from a chair using your arms (e.g., wheelchair or bedside chair)?: A Little Help needed to walk in hospital room?: Total  Help needed climbing 3-5 steps with a railing? : A Lot 6 Click Score: 15    End of Session Equipment Utilized During Treatment: Gait belt Activity Tolerance: Patient tolerated treatment well Patient left: in chair;with call bell/phone within reach;with chair alarm set Nurse Communication: Mobility status;Other (comment) (purewick) PT Visit Diagnosis: Muscle weakness (generalized)  (M62.81);Unsteadiness on feet (R26.81)    Time: 0752-0828 PT Time Calculation (min) (ACUTE ONLY): 36 min   Charges:   PT Evaluation $PT Eval Moderate Complexity: 1 Mod PT Treatments $Therapeutic Activity: 8-22 mins        Vale Haven, PT, DPT Acute Rehabilitation Services Secure chat preferred Office [phone]     Blake Divine A Chrisean Kloth 05/30/2022, 9:01 AM

## 2022-05-30 NOTE — TOC Transition Note (Signed)
Transition of Care (TOC) - CM/SW Discharge Note Donn Pierini RN, BSN Transitions of Care Unit 4E- RN Case Manager See Treatment Team for direct phone #    Patient Details  Name: Caroline [name] MRN: [id number] Date of Birth: [birth year]  Transition of Care [facility]) CM/SW Contact:  Darrold Span, RN Phone Number: 05/30/2022, 4:38 PM   Clinical Narrative:    Pt stable for transition home today, HH orders placed for PT/OT.  CM to room to speak with pt and spouse at the bedside.  List provided for [facility] choice Per CMS guidelines from medicare.gov website with star ratings (copy placed in shadow chart) - per husband they have used [facility] in past and would like to use them again.  Address, phone # and PCP all confirmed.   Per spouse pt has all needed DME at home - no new needs noted. Spouse to transport home.   Call made to Amy w/ [facility] for [facility] referral- return call received and referral has been accepted.   RNCM will sign off for now as intervention is no longer needed. Please re-consult  if new needs arise, or contact RNCM assigned to treatment team for further questions/concerns.      Final next level of care: Home w Home Health Services Barriers to Discharge: No Barriers Identified   Patient Goals and CMS Choice Patient states their goals for this hospitalization and ongoing recovery are:: return home CMS Medicare.gov Compare Post Acute Care list provided to:: Patient Choice offered to / list presented to : Spouse  Discharge Placement                 Home w/ [facility]      Discharge Plan and Services   Discharge Planning Services: CM Consult Post Acute Care Choice: Home Health          DME Arranged: N/A DME Agency: NA       HH Arranged: PT, OT HH Agency: [facility] Date [facility] Agency Contacted: 05/30/22 Time HH Agency Contacted: 1637 Representative spoke with at [facility] Agency: Amy  Social Determinants of Health (SDOH) Interventions     Readmission Risk  Interventions     No data to display

## 2022-05-30 NOTE — Discharge Summary (Signed)
Physician Discharge Summary  Maresa Morash ZOX:[id number] DOB: 14-Jun-1936 DOA: 05/29/2022  PCP: Carlean Jews, NP  Admit date: 05/29/2022 Discharge date: 05/30/2022 Recommendations for Outpatient Follow-up:  Follow up with PCP in 1 weeks-call for appointment Please obtain BMP/CBC in one week  Discharge Dispo: home w [facility] Discharge Condition: Stable Code Status:   Code Status: Full Code Diet recommendation:  Diet Order             Diet Heart Room service appropriate? No; Fluid consistency: Thin  Diet effective now                    Brief/Interim Summary: 86 year old female with history of chronic UTIs, hypothyroidism, HTN, HLD,hypertension, history of stroke with short-term memory loss, delerium presents to the ED after having a syncopal episode.  After having breakfast patient passed out, had sweating and vomiting afterwards. As per husband she voiced she was tired, he suggested laying down and when he looked back on her she was laying back on chair staring in ceiling, some movements- mild on extremities. She did respond to his voice- EMS was called and her bp was in 100s, sugar was 166. started staring off, did not fall or had injury,he thought she had stroke or seizure. She was recently started on Gemtesa for incontinence for 10 days Patient otherwise denies any nausea, vomiting, chest pain, shortness of breath, fever, chills, headache, focal weakness, numbness tingling, speech difficulties  In the ED, hemodynamically stable BP in 130s to 170s, on room air, afebrile, labs with stable CMP CBC troponin 5> 7, EKG sinus rhythm no acute ST-T wave changes, cxr mild bibasilar atelectasis/scarring, CT head chronic small vessel ischemia and remote left PCA infarct. UA unremarkable. Zofran was ordered admission was requested for further management. Orthostatic vitals ordered in ED. patient was orthostatic positive in the ED. patient orthostatic vitals improved with hydration.  Echo EEG done -EEG no  acute finding once echo is done and results reported she will be discharged home    Discharge Diagnoses:  Principal Problem:   Syncope Active Problems:   Hypothyroidism   Chronic urinary tract infection   CVA (cerebral vascular accident) (HCC)   Benign essential HTN   Dyslipidemia  Syncope: Orthostatic.  Likely had syncopal episode in the setting of orthostasis.  Patient admitted underwent further work-up with echocardiogram EEG.  EEG unremarkable.  At this time she feels clinically improved with IV fluid hydration.  Orthostatic vitals now negative.  Seen by PT OT advised home health and will be arranged.  echo resulted as '1. Left ventricular ejection fraction, by estimation, is 60 to 65%. The left ventricle has normal function. The left ventricle has no regional wall motion abnormalities. Left ventricular diastolic parameters were normal is resulted and if no acute findings".+ discussed plan ofcare with patient and husband and okay for home today Hypothyroidism: Check TSH.  Resume Synthroid after med rec Chronic urinary tract infection: Recently placed on Gemtesa, has urology follow-up. CVA hx: Short-term memory loss: Nonfocal on exam.  Baseline mental status.  Cont home meds   Benign essential HTN: BP stable cont home meds Dyslipidemia: Resume home meds once med rec done  Consults: none Subjective: Alert awake oriented.  She feels improved.  She wants to go home today.  Discharge Exam: Vitals:   05/30/22 0741 05/30/22 1232  BP: (!) 146/52 (!) 176/84  Pulse: 68   Resp: 17   Temp: (!) 97.4 F (36.3 C) 97.8 F (36.6 C)  SpO2: 99%  General: Pt is alert, awake, not in acute distress Cardiovascular: RRR, S1/S2 +, no rubs, no gallops Respiratory: CTA bilaterally, no wheezing, no rhonchi Abdominal: Soft, NT, ND, bowel sounds + Extremities: no edema, no cyanosis  Discharge Instructions  Discharge Instructions     Discharge instructions   Complete by: As directed     Please call call MD or return to ER for similar or worsening recurring problem that brought you to hospital or if any fever,nausea/vomiting,abdominal pain, uncontrolled pain, chest pain,  shortness of breath or any other alarming symptoms.  Please follow-up your doctor as instructed in a week time and call the office for appointment.  Please avoid alcohol, smoking, or any other illicit substance and maintain healthy habits including taking your regular medications as prescribed.  You were cared for by a hospitalist during your hospital stay. If you have any questions about your discharge medications or the care you received while you were in the hospital after you are discharged, you can call the unit and ask to speak with the hospitalist on call if the hospitalist that took care of you is not available.  Once you are discharged, your primary care physician will handle any further medical issues. Please note that NO REFILLS for any discharge medications will be authorized once you are discharged, as it is imperative that you return to your primary care physician (or establish a relationship with a primary care physician if you do not have one) for your aftercare needs so that they can reassess your need for medications and monitor your lab values   Increase activity slowly   Complete by: As directed       Allergies as of 05/30/2022       Reactions   Gluten Meal Nausea And Vomiting   Patient has CELIAC DISEASE   Wheat Bran Nausea And Vomiting   Patient has CELIAC DISEASE   Adhesive [tape] Other (See Comments)   Patient's skin is VERY THIN and tears very easily; please use either paper tape or Coban wrap   Codeine Nausea And Vomiting   Other Other (See Comments)   Patient has Hypoglycemia; her husband stated "when her sugar crashes, it crashes hard."   Penicillins Other (See Comments)   Doesn't remember reaction , but denies any SOB/Swelling States " it was a long time ago "          Medication List     TAKE these medications    acetaminophen 325 MG tablet Commonly known as: TYLENOL Take 2 tablets (650 mg total) by mouth every 6 (six) hours as needed for mild pain, moderate pain, fever or headache.   atorvastatin 80 MG tablet Commonly known as: LIPITOR TAKE 1 TABLET (80 MG TOTAL) BY MOUTH AT BEDTIME.   Catheters Misc Use as directed. 1 box of inserts and 1 box canisters.   cholecalciferol 25 MCG (1000 UNIT) tablet Commonly known as: VITAMIN D3 Take 1 tablet (1,000 Units total) by mouth daily. What changed:  medication strength how much to take when to take this   cyanocobalamin 250 MCG tablet Commonly known as: VITAMIN B12 Take 1 tablet (250 mcg total) by mouth daily.   donepezil 10 MG tablet Commonly known as: Aricept Take 1 tablet (10 mg total) by mouth daily.   Eliquis 5 MG Tabs tablet Generic drug: apixaban TAKE 1 TABLET BY MOUTH 2 TIMES DAILY.   furosemide 20 MG tablet Commonly known as: LASIX Take 1 tablet (20 mg total) by mouth daily as  needed for edema.   Gemtesa 75 MG Tabs Generic drug: Vibegron Take 75 mg by mouth daily.   irbesartan 75 MG tablet Commonly known as: AVAPRO TAKE 1 TABLET BY MOUTH IN THE MORNING AND 1/2 TABLET IN THE EVENING.   levothyroxine 125 MCG tablet Commonly known as: SYNTHROID TAKE 1 TABLET (125 MCG TOTAL) BY MOUTH DAILY AT 6 AM.   meclizine 25 MG tablet Commonly known as: ANTIVERT Take 0.5 tablets (12.5 mg total) by mouth 3 (three) times daily as needed for dizziness.   nitrofurantoin 50 MG capsule Commonly known as: MACRODANTIN Take 1 capsule (50 mg total) by mouth daily.   ondansetron 4 MG tablet Commonly known as: ZOFRAN Take 1 tablet (4 mg total) by mouth every 6 (six) hours. What changed:  when to take this reasons to take this   pantoprazole 40 MG tablet Commonly known as: PROTONIX TAKE 1 TABLET BY MOUTH DAILY   QUEtiapine 25 MG tablet Commonly known as: SEROQUEL TAKE 1 TABLET (25 MG  TOTAL) BY MOUTH AT BEDTIME.   vitamin C 1000 MG tablet Take 1,000 mg by mouth daily.        Follow-up Information     Carlean Jews, NP Follow up in 1 week(s).   Specialty: Family Medicine Contact information: [address] [place] [address] [phone]                Allergies  Allergen Reactions   Gluten Meal Nausea And Vomiting    Patient has CELIAC DISEASE   Wheat Bran Nausea And Vomiting    Patient has CELIAC DISEASE   Adhesive [Tape] Other (See Comments)    Patient's skin is VERY THIN and tears very easily; please use either paper tape or Coban wrap   Codeine Nausea And Vomiting   Other Other (See Comments)    Patient has Hypoglycemia; her husband stated "when her sugar crashes, it crashes hard."   Penicillins Other (See Comments)    Doesn't remember reaction , but denies any SOB/Swelling States " it was a long time ago "     The results of significant diagnostics from this hospitalization (including imaging, microbiology, ancillary and laboratory) are listed below for reference.    Microbiology: No results found for this or any previous visit (from the past 240 hour(s)).  Procedures/Studies: ECHOCARDIOGRAM COMPLETE  Result Date: 05/30/2022    ECHOCARDIOGRAM REPORT   Patient Name:   Caroline [name] Date of Exam: 05/30/2022 Medical Rec #:  [id number]  Height:       64.0 in Accession #:    [id number] Weight:       146.2 lb Date of Birth:  1936-02-23  BSA:          1.712 m Patient Age:    86 years   BP:           176/84 mmHg Patient Gender: F          HR:           72 bpm. Exam Location:  Inpatient Procedure: 2D Echo Indications:    syncope  History:        Patient has prior history of Echocardiogram examinations, most                 recent 02/09/2018. Risk Factors:Hypertension and Dyslipidemia.  Sonographer:    Delcie Roch RDCS Referring Phys: [id number] [employer] IMPRESSIONS  1. Left ventricular ejection fraction, by estimation, is 60 to 65%. The  left  ventricle has normal function. The left ventricle has no regional wall motion abnormalities. Left ventricular diastolic parameters were normal.  2. Right ventricular systolic function is normal. The right ventricular size is normal. There is normal pulmonary artery systolic pressure.  3. The mitral valve is grossly normal. Trivial mitral valve regurgitation. No evidence of mitral stenosis.  4. The aortic valve is tricuspid. There is mild calcification of the aortic valve. There is mild thickening of the aortic valve. Aortic valve regurgitation is mild to moderate. Aortic valve sclerosis/calcification is present, without any evidence of aortic stenosis.  5. The inferior vena cava is normal in size with <50% respiratory variability, suggesting right atrial pressure of 8 mmHg. Comparison(s): No significant change from prior study. Conclusion(s)/Recommendation(s): Otherwise normal echocardiogram, with minor abnormalities described in the report. FINDINGS  Left Ventricle: Left ventricular ejection fraction, by estimation, is 60 to 65%. The left ventricle has normal function. The left ventricle has no regional wall motion abnormalities. The left ventricular internal cavity size was normal in size. There is  no left ventricular hypertrophy. Left ventricular diastolic parameters were normal. Right Ventricle: The right ventricular size is normal. No increase in right ventricular wall thickness. Right ventricular systolic function is normal. There is normal pulmonary artery systolic pressure. The tricuspid regurgitant velocity is 2.58 m/s, and  with an assumed right atrial pressure of 8 mmHg, the estimated right ventricular systolic pressure is 34.6 mmHg. Left Atrium: Left atrial size was normal in size. Right Atrium: Right atrial size was normal in size. Pericardium: There is no evidence of pericardial effusion. Mitral Valve: The mitral valve is grossly normal. There is mild calcification of the mitral valve leaflet(s).  Mild mitral annular calcification. Trivial mitral valve regurgitation. No evidence of mitral valve stenosis. Tricuspid Valve: The tricuspid valve is normal in structure. Tricuspid valve regurgitation is mild . No evidence of tricuspid stenosis. Aortic Valve: LCC does not appear to open completely on short axis. The aortic valve is tricuspid. There is mild calcification of the aortic valve. There is mild thickening of the aortic valve. Aortic valve regurgitation is mild to moderate. Aortic regurgitation PHT measures 508 msec. Aortic valve sclerosis/calcification is present, without any evidence of aortic stenosis. Pulmonic Valve: The pulmonic valve was not well visualized. Pulmonic valve regurgitation is not visualized. No evidence of pulmonic stenosis. Aorta: The aortic root, ascending aorta and aortic arch are all structurally normal, with no evidence of dilitation or obstruction. Venous: The inferior vena cava is normal in size with less than 50% respiratory variability, suggesting right atrial pressure of 8 mmHg. IAS/Shunts: The atrial septum is grossly normal.  LEFT VENTRICLE PLAX 2D LVIDd:         4.10 cm   Diastology LVIDs:         2.70 cm   LV e' medial:    8.16 cm/s LV PW:         0.90 cm   LV E/e' medial:  10.1 LV IVS:        1.00 cm   LV e' lateral:   5.44 cm/s LVOT diam:     1.80 cm   LV E/e' lateral: 15.2 LV SV:         83 LV SV Index:   48 LVOT Area:     2.54 cm  RIGHT VENTRICLE             IVC RV S prime:     10.10 cm/s  IVC diam: 2.10 cm TAPSE (M-mode): 2.2 cm  LEFT ATRIUM             Index        RIGHT ATRIUM           Index LA diam:        3.40 cm 1.99 cm/m   RA Area:     10.00 cm LA Vol (A2C):   42.2 ml 24.65 ml/m  RA Volume:   20.00 ml  11.68 ml/m LA Vol (A4C):   39.4 ml 23.01 ml/m LA Biplane Vol: 40.7 ml 23.77 ml/m  AORTIC VALVE LVOT Vmax:   154.00 cm/s LVOT Vmean:  101.000 cm/s LVOT VTI:    0.325 m AI PHT:      508 msec  AORTA Ao Root diam: 3.50 cm Ao Asc diam:  3.20 cm MITRAL VALVE                 TRICUSPID VALVE MV Area (PHT): 3.31 cm     TR Peak grad:   26.6 mmHg MV Decel Time: 229 msec     TR Vmax:        258.00 cm/s MV E velocity: 82.50 cm/s MV A velocity: 117.00 cm/s  SHUNTS MV E/A ratio:  0.71         Systemic VTI:  0.32 m                             Systemic Diam: 1.80 cm Jodelle Red MD Electronically signed by Jodelle Red MD Signature Date/Time: 05/30/2022/4:12:01 PM    Final    EEG adult  Result Date: 05/30/2022 Charlsie Quest, MD     05/30/2022 11:56 AM Patient Name: Ronnell Makarewicz MRN: [id number] Epilepsy Attending: Charlsie Quest Referring Physician/Provider: Lanae Boast, MD Date: 05/30/2022 Duration: 24.07 mins Patient history: 86yo F with syncope. EEG to evaluate for seizure Level of alertness: Awake AEDs during EEG study: None Technical aspects: This EEG study was done with scalp electrodes positioned according to the 10-20 International system of electrode placement. Electrical activity was reviewed with band pass filter of 1-70Hz , sensitivity of 7 uV/mm, display speed of 53mm/sec with a 60Hz  notched filter applied as appropriate. EEG data were recorded continuously and digitally stored.  Video monitoring was available and reviewed as appropriate. Description: The posterior dominant rhythm consists of 8-9 Hz activity of moderate voltage (25-35 uV) seen predominantly in posterior head regions, symmetric and reactive to eye opening and eye closing. IMPRESSION: This study is within normal limits. No seizures or epileptiform discharges were seen throughout the recording. A normal interictal EEG does not exclude nor support the diagnosis of epilepsy.   DG Chest Port 1 View  Result Date: 05/29/2022 CLINICAL DATA:  Syncope. EXAM: PORTABLE CHEST 1 VIEW COMPARISON:  07/04/2021 chest radiograph, 07/17/2021 chest CT and prior studies FINDINGS: The cardiomediastinal silhouette is unchanged. Mild bibasilar atelectasis/scarring again noted. No focal airspace  disease, pneumothorax, large pleural effusion or acute bony abnormality identified. IMPRESSION: No evidence of acute cardiopulmonary disease. Mild bibasilar atelectasis/scarring again noted. Electronically Signed   By: 07/19/2021 M.D.   On: 05/29/2022 11:01   CT Head Wo Contrast  Result Date: 05/29/2022 CLINICAL DATA:  Syncope/presyncope EXAM: CT HEAD WITHOUT CONTRAST TECHNIQUE: Contiguous axial images were obtained from the base of the skull through the vertex without intravenous contrast. RADIATION DOSE REDUCTION: This exam was performed according to the departmental dose-optimization program which includes automated exposure control, adjustment of the mA and/or  kV according to patient size and/or use of iterative reconstruction technique. COMPARISON:  12/04/2021 FINDINGS: Brain: No evidence of acute infarction, hemorrhage, hydrocephalus, extra-axial collection or mass lesion/mass effect. Remote left occipito temporal infarct, PCA distribution. Confluent chronic small vessel ischemia in the cerebral white matter. Chronic lacunar infarct in the right thalamus. Brain atrophy with ventriculomegaly. Vascular: No hyperdense vessel or unexpected calcification. Skull: Normal. Negative for fracture or focal lesion. Sinuses/Orbits: Bilateral cataract resection IMPRESSION: 1. No acute finding. 2. Chronic small vessel ischemia and remote left PCA infarct. Electronically Signed   By: Tiburcio Pea M.D.   On: 05/29/2022 10:40    Labs: BNP (last 3 results) Recent Labs    07/17/21 1600  BNP 43.2   Basic Metabolic Panel: Recent Labs  Lab 05/29/22 1031 05/30/22 0227  NA 138 139  K 3.9 3.8  CL 106 107  CO2 25 24  GLUCOSE 122* 91  BUN 18 18  CREATININE 0.85 0.79  CALCIUM 8.9 8.7*   Liver Function Tests: Recent Labs  Lab 05/29/22 1031  AST 27  ALT 15  ALKPHOS 66  BILITOT 0.5  PROT 6.1*  ALBUMIN 3.4*   No results for input(s): "LIPASE", "AMYLASE" in the last 168 hours. Recent Labs  Lab  05/29/22 2004  AMMONIA 23   CBC: Recent Labs  Lab 05/29/22 1031  WBC 6.7  NEUTROABS 5.0  HGB 12.5  HCT 38.7  MCV 95.8  PLT 229   Cardiac Enzymes: No results for input(s): "CKTOTAL", "CKMB", "CKMBINDEX", "TROPONINI" in the last 168 hours. BNP: Invalid input(s): "POCBNP" CBG: Recent Labs  Lab 05/30/22 0636  GLUCAP 93   D-Dimer No results for input(s): "DDIMER" in the last 72 hours. Hgb A1c No results for input(s): "HGBA1C" in the last 72 hours. Lipid Profile No results for input(s): "CHOL", "HDL", "LDLCALC", "TRIG", "CHOLHDL", "LDLDIRECT" in the last 72 hours. Thyroid function studies Recent Labs    05/29/22 1620  TSH 1.255   Anemia work up Recent Labs    05/29/22 1620  VITAMINB12 1,397*   Urinalysis    Component Value Date/Time   COLORURINE YELLOW 05/29/2022 1318   APPEARANCEUR HAZY (A) 05/29/2022 1318   APPEARANCEUR Cloudy (A) 12/11/2021 1427   LABSPEC 1.015 05/29/2022 1318   PHURINE 5.0 05/29/2022 1318   GLUCOSEU NEGATIVE 05/29/2022 1318   HGBUR NEGATIVE 05/29/2022 1318   BILIRUBINUR NEGATIVE 05/29/2022 1318   BILIRUBINUR neg 05/27/2022 1418   BILIRUBINUR Negative 12/11/2021 1427   KETONESUR NEGATIVE 05/29/2022 1318   PROTEINUR NEGATIVE 05/29/2022 1318   UROBILINOGEN 0.2 05/27/2022 1418   NITRITE NEGATIVE 05/29/2022 1318   LEUKOCYTESUR NEGATIVE 05/29/2022 1318   Sepsis Labs Recent Labs  Lab 05/29/22 1031  WBC 6.7   Microbiology No results found for this or any previous visit (from the past 240 hour(s)).   Time coordinating discharge: 25 minutes  SIGNED: Lanae Boast, MD  [facility] 05/30/2022, 4:16 PM  If 7PM-7AM, please contact night-coverage [email or web address]

## 2022-05-30 NOTE — Progress Notes (Signed)
OT Cancellation Note  Patient Details Name: Caroline [name] MRN: [id number] DOB: 11/07/1935   Cancelled Treatment:    Reason Eval/Treat Not Completed: Patient at procedure or test/ unavailable (Pt being placed on EEG upon arrival; will return later today for OT evaluation.)  Caroline [name] 05/30/2022, 10:26 AM

## 2022-05-30 NOTE — Progress Notes (Signed)
EEG complete - results pending 

## 2022-05-30 NOTE — Evaluation (Signed)
Occupational Therapy Evaluation Patient Details Name: Caroline [name] MRN: [id number] DOB: 11-Dec-1935 Today's Date: 05/30/2022   History of Present Illness Patient is a 86 y/o female who presents on 8/26 after syncopal episode at home. PMH includes CVA, DVT, HTN, scoliosis.   Clinical Impression   Caroline [name] was evaluated s/p the above admission list, husband present and confirms PLOF and home set up. Upon evaluation pt had functional limitations due to impaired cognition, poor balance, general weakness and decreased activity tolerance. Overall she requires up to mod A for ADLs and min A for mobility with RW. Husband reports she is near her functional baseline. She will benefit from OT acutely, recommend d/c to home with support of husband and HHOT.      Recommendations for follow up therapy are one component of a multi-disciplinary discharge planning process, led by the attending physician.  Recommendations may be updated based on patient status, additional functional criteria and insurance authorization.   Follow Up Recommendations  Home health OT    Assistance Recommended at Discharge Frequent or constant Supervision/Assistance  Patient can return home with the following A little help with walking and/or transfers;A little help with bathing/dressing/bathroom;Assistance with feeding;Assistance with cooking/housework;Direct supervision/assist for medications management;Direct supervision/assist for financial management;Help with stairs or ramp for entrance;Assist for transportation    Functional Status Assessment  Patient has had a recent decline in their functional status and demonstrates the ability to make significant improvements in function in a reasonable and predictable amount of time.  Equipment Recommendations  None recommended by OT    Recommendations for Other Services       Precautions / Restrictions Precautions Precautions: Fall Restrictions Weight Bearing Restrictions: No       Mobility Bed Mobility Overal bed mobility: Needs Assistance Bed Mobility: Supine to Sit, Sit to Supine     Supine to sit: Mod assist Sit to supine: Mod assist   General bed mobility comments: pts husband confirms pt needs assist fro bed mobility at baseline    Transfers Overall transfer level: Needs assistance Equipment used: Rolling walker (2 wheels) Transfers: Sit to/from Stand Sit to Stand: Min assist                  Balance Overall balance assessment: Needs assistance Sitting-balance support: Feet supported, No upper extremity supported Sitting balance-Leahy Scale: Fair     Standing balance support: During functional activity Standing balance-Leahy Scale: Poor                             ADL either performed or assessed with clinical judgement   ADL Overall ADL's : Needs assistance/impaired Eating/Feeding: Supervision/ safety;Sitting   Grooming: Minimal assistance;Standing Grooming Details (indicate cue type and reason): cues and assist for balance Upper Body Bathing: Minimal assistance;Sitting   Lower Body Bathing: Maximal assistance;Sit to/from stand   Upper Body Dressing : Minimal assistance;Sitting   Lower Body Dressing: Maximal assistance;Sit to/from stand   Toilet Transfer: Minimal Holiday representative;Ambulation;Rolling walker (2 wheels)   Toileting- Clothing Manipulation and Hygiene: Supervision/safety;Sit to/from stand       Functional mobility during ADLs: Minimal assistance;Rolling walker (2 wheels) General ADL Comments: needs step by step cues for all tasks     Vision Baseline Vision/History: 0 No visual deficits Vision Assessment?: No apparent visual deficits     Perception     Praxis      Pertinent Vitals/Pain       Hand Dominance Right  Extremity/Trunk Assessment Upper Extremity Assessment Upper Extremity Assessment: Generalized weakness   Lower Extremity Assessment Lower Extremity Assessment:  Generalized weakness   Cervical / Trunk Assessment Cervical / Trunk Assessment: Kyphotic   Communication Communication Communication: No difficulties   Cognition Arousal/Alertness: Awake/alert Behavior During Therapy: Impulsive Overall Cognitive Status: History of cognitive impairments - at baseline                                 General Comments: oriented to seld only, husband present and states her cognition has been impiared since previous CVA. Pt perseverates throughout and has STM deficits     General Comments  VSS on RA, husband present adn supportive. [name], RN notified    Exercises     Shoulder Instructions      Home Living Family/patient expects to be discharged to:: Private residence Living Arrangements: Spouse/significant other Available Help at Discharge: Family;Available PRN/intermittently Type of Home: House Home Access: Stairs to enter [employer] of Steps: 3 steps in front; 1 step in garage Entrance Stairs-Rails: None Home Layout: Multi-level;Full bath on main level;Able to live on main level with bedroom/bathroom     Bathroom Shower/Tub: Producer, television/film/video: Standard Bathroom Accessibility: Yes How Accessible: Accessible via walker Home Equipment: BSC/3in1;Rolling Walker (2 wheels);Cane - single point;Wheelchair - manual;Shower seat          Prior Functioning/Environment Prior Level of Function : Needs assist  Cognitive Assist : Mobility (cognitive);ADLs (cognitive)     Physical Assist : ADLs (physical);Mobility (physical)     Mobility Comments: ambulates with RW, and husband stays close by ADLs Comments: husband assists fro all ADLs due to physical weakness and impaired cognition        OT Problem List: Decreased strength;Decreased range of motion;Decreased activity tolerance;Impaired balance (sitting and/or standing);Decreased cognition;Decreased safety awareness;Decreased knowledge of use of DME  or AE      OT Treatment/Interventions: Self-care/ADL training;Therapeutic exercise;DME and/or AE instruction;Therapeutic activities;Patient/family education;Balance training    OT Goals(Current goals can be found in the care plan section) Acute Rehab OT Goals Patient Stated Goal: home OT Goal Formulation: With patient Time For Goal Achievement: 06/13/22 Potential to Achieve Goals: Good ADL Goals Pt Will Perform Lower Body Dressing: with set-up;sit to/from stand Pt Will Transfer to Toilet: with supervision;ambulating Additional ADL Goal #1: Pt will follow 100% of simple one step commands without needing repetition  OT Frequency: Min 2X/week    Co-evaluation              AM-PAC OT "6 Clicks" Daily Activity     Outcome Measure Help from another person eating meals?: A Little Help from another person taking care of personal grooming?: A Little Help from another person toileting, which includes using toliet, bedpan, or urinal?: A Lot Help from another person bathing (including washing, rinsing, drying)?: A Lot Help from another person to put on and taking off regular upper body clothing?: A Little Help from another person to put on and taking off regular lower body clothing?: A Lot 6 Click Score: 15   End of Session Equipment Utilized During Treatment: Gait belt;Rolling walker (2 wheels) Nurse Communication: Mobility status  Activity Tolerance: Patient tolerated treatment well Patient left: in bed;with call bell/phone within reach;with bed alarm set;with family/visitor present  OT Visit Diagnosis: Unsteadiness on feet (R26.81);Other abnormalities of gait and mobility (R26.89);Muscle weakness (generalized) (M62.81)  Time: 1341-1405 OT Time Calculation (min): 24 min Charges:  OT General Charges $OT Visit: 1 Visit OT Evaluation $OT Eval Moderate Complexity: 1 Mod OT Treatments $Self Care/Home Management : 8-22 mins    Naria Abbey A Ajeenah Heiny 05/30/2022, 3:47 PM

## 2022-05-30 NOTE — Progress Notes (Signed)
Attempted echo 2 times this morning, but patient was eating, then with EEG.

## 2022-06-01 ENCOUNTER — Ambulatory Visit: Payer: Medicare Other | Admitting: Podiatry

## 2022-06-11 ENCOUNTER — Encounter: Payer: Self-pay | Admitting: Nurse Practitioner

## 2022-06-11 ENCOUNTER — Ambulatory Visit (INDEPENDENT_AMBULATORY_CARE_PROVIDER_SITE_OTHER): Payer: Medicare Other | Admitting: Nurse Practitioner

## 2022-06-11 VITALS — BP 121/67 | HR 67 | Ht 64.0 in | Wt 144.0 lb

## 2022-06-11 DIAGNOSIS — K591 Functional diarrhea: Secondary | ICD-10-CM

## 2022-06-11 DIAGNOSIS — I10 Essential (primary) hypertension: Secondary | ICD-10-CM | POA: Diagnosis not present

## 2022-06-11 DIAGNOSIS — R410 Disorientation, unspecified: Secondary | ICD-10-CM | POA: Diagnosis not present

## 2022-06-11 DIAGNOSIS — N390 Urinary tract infection, site not specified: Secondary | ICD-10-CM | POA: Diagnosis not present

## 2022-06-11 NOTE — Progress Notes (Signed)
Established patient visit   Patient: Caroline [name]   DOB: June 27, 1936   86 y.o. Female  MRN: [id number] Visit Date: 06/11/2022   Chief Complaint  Patient presents with   Hospitalization Follow-up   Subjective    HPI  Er/hospitalization follow up -treated for dehydration after blood pressure dropped and she had a near syncopal medication.  -several medications eliminated or lowered and this has eliminated her chronic diarrhea which likely contributed to the diarrhea  -continues lipitor 80 mg daily -eliquis  -donepazil  -irbesartan is now 1/2 in AM and 1/2 at night  -Seroquel is every other night -gemtessa is every other day  -calcium is 3000 mg -vitamin d is 500 mg daily  -synthroid 125 mcg - TSH was tested during ER and was WNL    Medications: Outpatient Medications Prior to Visit  Medication Sig   acetaminophen (TYLENOL) 325 MG tablet Take 2 tablets (650 mg total) by mouth every 6 (six) hours as needed for mild pain, moderate pain, fever or headache.   Ascorbic Acid (VITAMIN C) 1000 MG tablet Take 1,000 mg by mouth daily.   atorvastatin (LIPITOR) 80 MG tablet TAKE 1 TABLET (80 MG TOTAL) BY MOUTH AT BEDTIME.   Catheters MISC Use as directed. 1 box of inserts and 1 box canisters.   donepezil (ARICEPT) 10 MG tablet Take 1 tablet (10 mg total) by mouth daily.   ELIQUIS 5 MG TABS tablet TAKE 1 TABLET BY MOUTH 2 TIMES DAILY.   furosemide (LASIX) 20 MG tablet Take 1 tablet (20 mg total) by mouth daily as needed for edema.   irbesartan (AVAPRO) 75 MG tablet TAKE 1 TABLET BY MOUTH IN THE MORNING AND 1/2 TABLET IN THE EVENING.   levothyroxine (SYNTHROID) 125 MCG tablet TAKE 1 TABLET (125 MCG TOTAL) BY MOUTH DAILY AT 6 AM.   meclizine (ANTIVERT) 25 MG tablet Take 0.5 tablets (12.5 mg total) by mouth 3 (three) times daily as needed for dizziness.   ondansetron (ZOFRAN) 4 MG tablet Take 1 tablet (4 mg total) by mouth every 6 (six) hours. (Patient taking differently: Take 4 mg by mouth every 8  (eight) hours as needed for nausea or vomiting.)   pantoprazole (PROTONIX) 40 MG tablet TAKE 1 TABLET BY MOUTH DAILY   vitamin B-12 (CYANOCOBALAMIN) 250 MCG tablet Take 1 tablet (250 mcg total) by mouth daily.   [DISCONTINUED] cholecalciferol (VITAMIN D3) 25 MCG (1000 UNIT) tablet Take 1 tablet (1,000 Units total) by mouth daily.   [DISCONTINUED] nitrofurantoin (MACRODANTIN) 50 MG capsule Take 1 capsule (50 mg total) by mouth daily.   [DISCONTINUED] QUEtiapine (SEROQUEL) 25 MG tablet TAKE 1 TABLET (25 MG TOTAL) BY MOUTH AT BEDTIME.   [DISCONTINUED] Vibegron (GEMTESA) 75 MG TABS Take 75 mg by mouth daily.   No facility-administered medications prior to visit.    Review of Systems  Constitutional:  Negative for activity change, appetite change, chills, fatigue and fever.  HENT:  Negative for congestion, postnasal drip, rhinorrhea, sinus pressure, sinus pain, sneezing and sore throat.   Eyes: Negative.   Respiratory:  Negative for cough, chest tightness, shortness of breath and wheezing.   Cardiovascular:  Negative for chest pain and palpitations.  Gastrointestinal:  Positive for diarrhea. Negative for abdominal pain, constipation, nausea and vomiting.  Endocrine: Negative for cold intolerance, heat intolerance, polydipsia and polyuria.  Genitourinary:  Negative for dyspareunia, dysuria, flank pain, frequency and urgency.       Frequent uti   Musculoskeletal:  Negative for arthralgias, back pain and  myalgias.  Skin:  Negative for rash.  Allergic/Immunologic: Negative for environmental allergies.  Neurological:  Positive for weakness. Negative for dizziness and headaches.       Memory difficulties   Hematological:  Negative for adenopathy.  Psychiatric/Behavioral:  Positive for confusion. The patient is nervous/anxious.     Last CBC Lab Results  Component Value Date   WBC 6.7 05/29/2022   HGB 12.5 05/29/2022   HCT 38.7 05/29/2022   MCV 95.8 05/29/2022   MCH 30.9 05/29/2022   RDW  16.5 (H) 05/29/2022   PLT 229 05/29/2022   Last metabolic panel Lab Results  Component Value Date   GLUCOSE 91 05/30/2022   NA 139 05/30/2022   K 3.8 05/30/2022   CL 107 05/30/2022   CO2 24 05/30/2022   BUN 18 05/30/2022   CREATININE 0.79 05/30/2022   GFRNONAA >60 05/30/2022   CALCIUM 8.7 (L) 05/30/2022   PROT 6.1 (L) 05/29/2022   ALBUMIN 3.4 (L) 05/29/2022   LABGLOB 2.0 10/22/2021   AGRATIO 2.4 (H) 10/22/2021   BILITOT 0.5 05/29/2022   ALKPHOS 66 05/29/2022   AST 27 05/29/2022   ALT 15 05/29/2022   ANIONGAP 8 05/30/2022   Last lipids Lab Results  Component Value Date   CHOL 153 10/22/2021   HDL 60 10/22/2021   LDLCALC 73 10/22/2021   TRIG 114 10/22/2021   CHOLHDL 2.6 10/22/2021   Last hemoglobin A1c Lab Results  Component Value Date   HGBA1C 5.7 (H) 10/22/2021   Last thyroid functions Lab Results  Component Value Date   TSH 1.255 05/29/2022   T3TOTAL 75 09/09/2021   T4TOTAL 10.3 09/09/2021    Last vitamin B12 and Folate Lab Results  Component Value Date   VITAMINB12 1,397 (H) 05/29/2022   FOLATE >20.0 10/21/2021       Objective     Today's Vitals   06/11/22 1104  BP: 121/67  Pulse: 67  SpO2: (Abnormal) 67%  Weight: 144 lb (65.3 kg)  Height: 5\' 4"  (1.626 m)   Body mass index is 24.72 kg/m.   BP Readings from Last 3 Encounters:  06/17/22 (Abnormal) 172/48  06/11/22 121/67  05/30/22 (Abnormal) 176/84    Wt Readings from Last 3 Encounters:  06/17/22 141 lb (64 kg)  06/11/22 144 lb (65.3 kg)  05/30/22 146 lb 2.6 oz (66.3 kg)    Physical Exam Vitals and nursing note reviewed.  Constitutional:      Appearance: Normal appearance. She is well-developed.  HENT:     Head: Normocephalic and atraumatic.     Nose: Nose normal.     Mouth/Throat:     Mouth: Mucous membranes are moist.     Pharynx: Oropharynx is clear.  Eyes:     Extraocular Movements: Extraocular movements intact.     Conjunctiva/sclera: Conjunctivae normal.     Pupils:  Pupils are equal, round, and reactive to light.  Cardiovascular:     Rate and Rhythm: Normal rate and regular rhythm.     Pulses: Normal pulses.     Heart sounds: Normal heart sounds.  Pulmonary:     Effort: Pulmonary effort is normal.     Breath sounds: Normal breath sounds.  Abdominal:     General: Bowel sounds are normal. There is no distension.     Palpations: Abdomen is soft. There is no mass.     Tenderness: There is no abdominal tenderness. There is no right CVA tenderness, left CVA tenderness, guarding or rebound.     Hernia: No  hernia is present.  Musculoskeletal:        General: Normal range of motion.     Cervical back: Normal range of motion and neck supple.  Lymphadenopathy:     Cervical: No cervical adenopathy.  Skin:    General: Skin is warm and dry.     Capillary Refill: Capillary refill takes less than 2 seconds.  Neurological:     General: No focal deficit present.     Mental Status: She is alert and oriented to person, place, and time. Mental status is at baseline.  Psychiatric:        Mood and Affect: Mood normal.        Behavior: Behavior normal.        Thought Content: Thought content normal.        Judgment: Judgment normal.      Assessment & Plan    1. Functional diarrhea Patient having problems with frequent an loose bowel movements. Patient's husband, who is her primary care giver has eliminated several medications and cut back others to prevent diarrhea. Updated patient medication list to reflect changes made per her husband.    2. Frequent UTI Continue regular visits  with urology as scheduled   3. Essential hypertension Generally stable. Continue all bp medication as prescribed   4. Confusion Patient at baseline today. Continue regular visits with urology as scheduled .   Problem List Items Addressed This Visit       Cardiovascular and Mediastinum   Essential hypertension     Digestive   Functional diarrhea - Primary     Nervous and  Auditory   Confusion     Genitourinary   Frequent UTI     Return in about 2 weeks (around 06/25/2022) for dehydration/diarrhea - will need full lab check at time of visit. please make AM visit.         Carlean Jews, NP  [facility] [phone] (phone) [phone] (fax)  [facility]

## 2022-06-16 ENCOUNTER — Other Ambulatory Visit: Payer: Self-pay | Admitting: Nurse Practitioner

## 2022-06-16 ENCOUNTER — Other Ambulatory Visit (INDEPENDENT_AMBULATORY_CARE_PROVIDER_SITE_OTHER): Payer: Medicare Other

## 2022-06-16 DIAGNOSIS — N390 Urinary tract infection, site not specified: Secondary | ICD-10-CM | POA: Diagnosis not present

## 2022-06-16 LAB — POCT URINALYSIS DIP (CLINITEK)
Bilirubin, UA: NEGATIVE
Blood, UA: NEGATIVE
Glucose, UA: NEGATIVE mg/dL
Ketones, POC UA: NEGATIVE mg/dL
Leukocytes, UA: NEGATIVE
Nitrite, UA: POSITIVE — AB
POC PROTEIN,UA: NEGATIVE
Spec Grav, UA: 1.02 (ref 1.010–1.025)
Urobilinogen, UA: 0.2 E.U./dL
pH, UA: 6.5 (ref 5.0–8.0)

## 2022-06-16 MED ORDER — NITROFURANTOIN MACROCRYSTAL 50 MG PO CAPS: 50.0000 mg | ORAL_CAPSULE | Freq: Two times a day (BID) | ORAL | 3 refills | Status: DC

## 2022-06-16 NOTE — Progress Notes (Signed)
Please let the patient/husband know that she did have infection In the urine today. I would like her to restart the macrobid 50 mg but I want her to take it twice daily for next 10 days. We are sending the urine for culture and sensitivity and will adjust antibiotics as indicated.  Thanks so much.   -HB

## 2022-06-17 ENCOUNTER — Encounter: Payer: Medicare Other | Attending: Physical Medicine & Rehabilitation | Admitting: Physical Medicine & Rehabilitation

## 2022-06-17 ENCOUNTER — Encounter: Payer: Self-pay | Admitting: Physical Medicine & Rehabilitation

## 2022-06-17 VITALS — BP 172/48 | HR 90 | Ht 64.0 in | Wt 141.0 lb

## 2022-06-17 DIAGNOSIS — I69319 Unspecified symptoms and signs involving cognitive functions following cerebral infarction: Secondary | ICD-10-CM | POA: Insufficient documentation

## 2022-06-17 NOTE — Progress Notes (Signed)
Subjective:    Patient ID: Caroline [name], female    DOB: 11-07-1935, 86 y.o.   MRN: [id number]  HPI 86 yo female with prior CVA in 2019, who suffered a left PCA infarct in 2022 resulting in inpt CIR stay at [facility].  Lives with husband who provides assistance of ADL.  Ambulates with walker but needs supervision Hospitalized overnite for syncope episode  Has problems with urinary and bowel incontinence Sees urology for recurrent UTI- was on nitrofurantoin for prophyllaxis Has f/u appt with [facility]  Still has right visual field cut On Aricept- discussed need to talk to Dr [name] regarding other options  Pain Inventory Average Pain 1 Pain Right Now 1 My pain is aching  In the last 24 hours, has pain interfered with the following? General activity 0 Relation with others 0 Enjoyment of life 0 What TIME of day is your pain at its worst? morning  Sleep (in general) Fair  Pain is worse with: walking Pain improves with:  . Relief from Meds: 1  Family History  Problem Relation Age of Onset   Arthritis Mother        pt denies   Hypertension Mother        pt denies   Obesity Sister    Cancer Maternal Uncle        type unknown   Colon cancer Neg Hx    Stomach cancer Neg Hx    Esophageal cancer Neg Hx    Pancreatic cancer Neg Hx    Social History   Socioeconomic History   Marital status: Married    Spouse name: [name]   Number of children: 3   Years of education: 12   Highest education level: Not on file  Occupational History   Occupation: retired  Tobacco Use   Smoking status: Never   Smokeless tobacco: Never  Vaping Use   Vaping Use: Never used  Substance and Sexual Activity   Alcohol use: No   Drug use: Not Currently   Sexual activity: Never  Other Topics Concern   Not on file  Social History Narrative   Lives with husband   Right Handed   Drinks no caffeine   Social Determinants of Health   Financial Resource Strain: Not on file  Food  Insecurity: Not on file  Transportation Needs: Not on file  Physical Activity: Not on file  Stress: Not on file  Social Connections: Not on file   Past Surgical History:  Procedure Laterality Date   ABDOMINAL HYSTERECTOMY  1979   CATARACT EXTRACTION, BILATERAL     knee surgery Left    VARICOSE VEIN SURGERY Left    XI ROBOTIC ASSISTED PARAESOPHAGEAL HERNIA REPAIR N/A 06/15/2021   Procedure: XI ROBOTIC ASSISTED PARAESOPHAGEAL HERNIA REPAIR, GASTROPEXY, UPPER ENDOSCOPY;  Surgeon: Berna Bue, MD;  Location: [facility];  Service: General;  Laterality: N/A;   Past Surgical History:  Procedure Laterality Date   ABDOMINAL HYSTERECTOMY  1979   CATARACT EXTRACTION, BILATERAL     knee surgery Left    VARICOSE VEIN SURGERY Left    XI ROBOTIC ASSISTED PARAESOPHAGEAL HERNIA REPAIR N/A 06/15/2021   Procedure: XI ROBOTIC ASSISTED PARAESOPHAGEAL HERNIA REPAIR, GASTROPEXY, UPPER ENDOSCOPY;  Surgeon: Berna Bue, MD;  Location: [facility];  Service: General;  Laterality: N/A;   Past Medical History:  Diagnosis Date   Anxiety    Arthritis    Celiac sprue    CVA (cerebral vascular accident) (HCC) 02/2018   R thalamic  CVA   GERD (gastroesophageal reflux disease)    Glaucoma    Hormone replacement therapy (HRT)    Hyperlipidemia    Hypertension    Hypoglycemia    Hypothyroidism    OAB (overactive bladder)    Osteoporosis    PONV (postoperative nausea and vomiting)    Raynaud's disease    Scoliosis    Seasonal allergies    Senile purpura (HCC)    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 Cheek   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   Bridge Left Nose, and Left Neck   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 Cheek Tx: curret x 3 and 5FU   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   Bridge of nose tx Cx3 and 5FU, Left neck Tx Cx3 and 5FU   Thrombophlebitis    BP (!) 172/48   Pulse 90   Ht 5\' 4"  (1.626 m)   Wt 141 lb (64 kg)    SpO2 97%   BMI 24.20 kg/m   Opioid Risk Score:   Fall Risk Score:  `1  Depression screen PHQ 2/9     05/27/2022    2:16 PM 01/20/2022    1:40 PM 12/10/2021   10:48 AM 10/21/2021    2:00 PM 07/28/2021    1:35 PM 07/21/2021    1:39 PM  Depression screen PHQ 2/9  Decreased Interest 1 1 1  0 1 1  Down, Depressed, Hopeless 1 0 1 0 1 1  PHQ - 2 Score 2 1 2  0 2 2  Altered sleeping 1 0  0 1 0  Tired, decreased energy 3 1  0 2 1  Change in appetite 0 0  0 1 1  Feeling bad or failure about yourself  1 0  0 1 0  Trouble concentrating 2 1  0 2 3  Moving slowly or fidgety/restless 0 0  0 2 1  Suicidal thoughts 0 0  0 0 0  PHQ-9 Score 9 3  0 11 8  Difficult doing work/chores Very difficult     Very difficult     Review of Systems  All other systems reviewed and are negative.     Objective:   Physical Exam  Orientation MD office in [place], Can tell [address] not [address]  Motor strength is 5/5 bilateral deltoid, bicep, tricep, grip, hip flexor, knee extensor, ankle dorsiflexion plantarflexion There is right homonymous hemianopsia with visual confrontation testing Sensation is intact bilaterally She can ambulate with a walker forward flexed posture Evidence of lumbar scoliosis with pelvic obliquity Mood and affect without lability or agitation.     Assessment & Plan:   Hx of Left PCA infarct, residual permanent right homonymous hemianopsia.  We discussed that her stroke was approximately 1 year ago so do not expect any further improvements with this.  She has seen an ophthalmologist to get a new eye prescription and that really did not help and we discussed there is really no specific treatment for this other than turning the head more toward the right side to bring the right visual field into view 2.  Cognitive deficits post multiple strokes.  We discussed that generally speaking: Treatment agents are not as effective in this setting as they are with Alzheimer's  dementia.  She will follow-up with neurology Dr. to discuss other potential options Discussed options with the patient and her husband who is very attentive Physical  medicine and rehab follow-up on as-needed basis

## 2022-06-17 NOTE — Patient Instructions (Signed)
Discuss alternatives to Donepezil with Dr [name]

## 2022-06-19 ENCOUNTER — Ambulatory Visit: Payer: Medicare Other | Admitting: Podiatry

## 2022-06-19 ENCOUNTER — Encounter: Payer: Self-pay | Admitting: Podiatry

## 2022-06-19 DIAGNOSIS — B351 Tinea unguium: Secondary | ICD-10-CM

## 2022-06-19 DIAGNOSIS — M79674 Pain in right toe(s): Secondary | ICD-10-CM | POA: Diagnosis not present

## 2022-06-19 DIAGNOSIS — M79675 Pain in left toe(s): Secondary | ICD-10-CM | POA: Diagnosis not present

## 2022-06-19 DIAGNOSIS — D689 Coagulation defect, unspecified: Secondary | ICD-10-CM

## 2022-06-21 ENCOUNTER — Other Ambulatory Visit: Payer: Self-pay | Admitting: Nurse Practitioner

## 2022-06-21 DIAGNOSIS — N390 Urinary tract infection, site not specified: Secondary | ICD-10-CM

## 2022-06-21 MED ORDER — SULFAMETHOXAZOLE-TRIMETHOPRIM 400-80 MG PO TABS: 1.0000 | ORAL_TABLET | Freq: Two times a day (BID) | ORAL | 0 refills | Status: DC

## 2022-06-21 NOTE — Progress Notes (Signed)
Please let the patient and husband know that urine culture shows bacteria which is resistant to macrobid. I would like to change this to Bactrim. This is twice daily for 7 days. I sent the new prescription to the pharmacy.  Thanks so much.   -HB

## 2022-06-24 ENCOUNTER — Telehealth: Payer: Self-pay | Admitting: Nurse Practitioner

## 2022-06-24 LAB — URINE CULTURE

## 2022-06-24 NOTE — Telephone Encounter (Signed)
Erica with [facility] called to let you know patients vitals are all good however she did report not sleeping well last night, nauseated and broke out in cold sweat. Bp is 110/56-105/50. [phone]

## 2022-06-24 NOTE — Telephone Encounter (Signed)
Called pt spoke to husband he is advised of the recommendation to drink plenty of fluids and monitor her BP

## 2022-06-25 NOTE — Progress Notes (Signed)
  Subjective:  Patient ID: Caroline [name], female    DOB: Feb 08, 1936,  MRN: [id number]  Caroline [name] presents to clinic today for at risk foot care with h/o Raynaud's and clotting disorder. She is seen for painful thick toenails that are difficult to trim. Pain interferes with ambulation. Aggravating factors include wearing enclosed shoe gear. Pain is relieved with periodic professional debridement.  New problem(s): None.   PCP is Ronnell Freshwater, NP , and last visit was  June 11, 2022.  Allergies  Allergen Reactions   Gluten Meal Nausea And Vomiting    Patient has CELIAC DISEASE   Wheat Bran Nausea And Vomiting    Patient has CELIAC DISEASE   Adhesive [Tape] Other (See Comments)    Patient's skin is VERY THIN and tears very easily; please use either paper tape or Coban wrap   Codeine Nausea And Vomiting   Other Other (See Comments)    Patient has Hypoglycemia; her husband stated "when her sugar crashes, it crashes hard."   Penicillins Other (See Comments)    Doesn't remember reaction , but denies any SOB/Swelling States " it was a long time ago "     Review of Systems: Negative except as noted in the HPI.  Objective: No changes noted in today's physical examination. Caroline [name] is a pleasant 86 y.o. female in NAD. AAO x 3. Vascular Examination: Vascular status intact b/l with palpable pedal pulses. Pedal hair present b/l. CFT immediate b/l. No edema. No pain with calf compression b/l. Skin temperature gradient WNL b/l.   Neurological Examination: Sensation grossly intact b/l with 10 gram monofilament. Vibratory sensation intact b/l.   Dermatological Examination: Pedal skin with normal turgor, texture and tone b/l. Toenails 1-5 b/l thick, discolored, elongated with subungual debris and pain on dorsal palpation. No hyperkeratotic lesions noted b/l.   Musculoskeletal Examination: Muscle strength 5/5 to b/l LE. Limited joint ROM to the STJ b/l. Utilizes walker for ambulation  assistance.  Radiographs: None Assessment/Plan: 1. Pain due to onychomycosis of toenails of both feet   2. Blood clotting disorder (HCC)      -Examined patient. -Patient to continue soft, supportive shoe gear daily. -Toenails 1-5 b/l were debrided in length and girth with sterile nail nippers and dremel without iatrogenic bleeding.  -Patient/POA to call should there be question/concern in the interim.   Return in about 3 months (around 09/18/2022).  Marzetta Board, DPM

## 2022-06-27 DIAGNOSIS — K591 Functional diarrhea: Secondary | ICD-10-CM | POA: Insufficient documentation

## 2022-06-27 MED ORDER — VITAMIN D 25 MCG (1000 UNIT) PO TABS: 500.0000 [IU] | ORAL_TABLET | Freq: Every day | ORAL | Status: AC

## 2022-06-27 MED ORDER — QUETIAPINE FUMARATE 25 MG PO TABS: 25.0000 mg | ORAL_TABLET | ORAL | 1 refills | Status: DC

## 2022-06-27 MED ORDER — GEMTESA 75 MG PO TABS: 75.0000 mg | ORAL_TABLET | ORAL | 0 refills | Status: DC

## 2022-06-27 MED ORDER — CALCIUM CARBONATE 1250 (500 CA) MG PO TABS: 2.0000 | ORAL_TABLET | Freq: Every day | ORAL | 0 refills | Status: AC

## 2022-06-29 NOTE — Progress Notes (Unsigned)
[facility] [address] [place]. [place] [id number] [phone]       OFFICE FOLLOW UP VISIT NOTE  [name] Date of Birth:  14-Feb-1936 Medical Record Number:  [id number]   Referring MD: Terrilee Croak PA-C  Reason for Referral: Stroke  RCV:[id number] visit 08/05/2021: Caroline [name] is a 86 year old pleasant Caucasian lady seen for initial office consultation visit for stroke.  She is accompanied by her daughter and husband.  History is obtained from them and review of electronic medical records and I personally reviewed pertinent available imaging films in PACS.  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  She presented on 06/19/2021 on postoperative day 1 for confusion, delirium and lethargy.  This was initially felt to be blood pressure and he was taken to the hospital by EMS.  He had an MRI scan of the brain personally reviewed.  Shows old left cerebellar infarct.  Continues to have peripheral vision loss, stroke improved.  Continues to walk with a walker, but no falls or injuries.  She is tolerating well without bruising or bleeding.  Blood pressure is under good control.  Tolerating Lipitor without muscle aches and.  .  She was also felt to have underlying low-grade dementia which was exacerbated by the surgery.  Her symptoms continued for couple of days and noncontrast CT scan of the head was obtained which showed acute left posterior cerebral artery stroke.  Her NIH was 4 on admission she is not a candidate for tPA due to recent surgery.  MRI scan was subsequently obtained which showed a left posterior cerebral artery infarct and MRA showed left P2 occlusion.  2D echo showed ejection fraction of 60 to 65% without cardiac source of embolism.  LDL cholesterol was elevated at 123 mg percent and  hemoglobin A1c was 5.5.  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  Patient continues to have right-sided peripheral vision loss.  She has been ambulating with a walker and has been careful.  She has had no falls or injuries.  The family has noticed worsening of her cognitive difficulties with patient having more frequent sundowning for which she has been started on Seroquel 25 mg at night which helps her sleep well.  There are no delusions or hallucinations during the day.  She has more short-term memory difficulties and intermittent confusion which is new.  She does have 24-hour care at home and cannot be left alone. Update 12/09/2021 : She returns for follow-up after last visit 4 months ago.  She is accompanied by her husband.  Patient continues to have short-term memory difficulties as well as at times difficulty finding her way around her own home.  She gets disoriented and confused off-and-on.  He has had recurrent bouts of UTIs which have been treated by her primary care physician is currently on [place].  Se had an episode of severe dizziness on 12/04/2021 and was taken to the emergency room by EMS blood pressure was found to be significantly elevated.  An MRI scan of the brain was obtained which I personally reviewed did not show any acute abnormality and showed old left PCA infarct.  She continues  to ambulate with a walker and history of pulmonary no falls injuries.  She is tolerating Eliquis well without bleeding or bruising..  Lab work on 10/22/2021 showed LDL cholesterol to be near optimal at 73 hemoglobin A1c at 5.7. Update 02/04/2022 : She returns for follow-up after last visit 2 months ago.  She is accompanied by husband and states that she has good days and bad days.  She  was from gets disoriented and confused in time she does not recognize even her husband and family members.  She is often talking to people who are dead.  I had started the patient on Namenda which she stopped it a month ago as her husband did not notice any benefit.  She is he has also not noticed that she is any worse after stopping the medicine.  Patient is UTI seem to be much better since she has been started on Macrodantin 50 mg which she takes every other day.  She does get occasionally agitated and irritable but there is no violent behavior.  She does not wander off.  She does sleep quite well and eats well.  She has not tried Aricept in the past and is willing to try it.  On Mini-Mental status exam today she scored 16/30.  Clock drawing 3/4.   Update 06/30/2022 JM: Patient returns for follow-up visit after prior visit 4 months ago.  Overall stable from stroke standpoint without new stroke/TIA symptoms.  Residual visual impairment, stable since prior visit.  Remains on Aricept for cognitive impairment poststroke.  Plan on Eliquis and atorvastatin, denies side effects Blood pressure ***      ROS:   14 system review of systems is positive for memory loss, irritability, agitation, hallucinations, disorientation, confusion imbalance, walking difficulty, peripheral vision loss and all other systems negative  PMH:  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 Cheek   Squamous cell carcinoma in situ  (SCCIS) 08/02/2018   Bridge Left Nose, and Left Neck   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 Cheek Tx: curret x 3 and 5FU   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   Bridge of nose tx Cx3 and 5FU, Left neck Tx Cx3 and 5FU   Thrombophlebitis     Social History:  Social History   Socioeconomic History   Marital status: Married    Spouse name: [name]   Number of children: 3   Years of education: 12   Highest education level: Not on file  Occupational History   Occupation: retired  Tobacco Use   Smoking status: Never   Smokeless tobacco: Never  Vaping Use   Vaping Use: Never used  Substance and Sexual Activity   Alcohol use: No   Drug use: Not Currently   Sexual activity: Never  Other Topics Concern   Not on file  Social History Narrative  Lives with husband   Right Handed   Drinks no caffeine   Social Determinants of Health   Financial Resource Strain: Not on file  Food Insecurity: Not on file  Transportation Needs: Not on file  Physical Activity: Not on file  Stress: Not on file  Social Connections: Not on file  Intimate Partner Violence: Not on file    Medications:   Current Outpatient Medications on File Prior to Visit  Medication Sig Dispense Refill   acetaminophen (TYLENOL) 325 MG tablet Take 2 tablets (650 mg total) by mouth every 6 (six) hours as needed for mild pain, moderate pain, fever or headache.     Ascorbic Acid (VITAMIN C) 1000 MG tablet Take 1,000 mg by mouth daily.     atorvastatin (LIPITOR) 80 MG tablet TAKE 1 TABLET (80 MG TOTAL) BY MOUTH AT BEDTIME. 30 tablet 2   calcium carbonate ([employer] CALCIUM CARBONATE) 1250 (500 Ca) MG tablet Take 2 tablets (2,500 mg total) by mouth daily. 30 tablet 0   Catheters MISC Use as directed. 1 box of inserts and 1 box canisters. 1 each 3   cholecalciferol (VITAMIN D3) 25 MCG (1000 UNIT) tablet Take 0.5 tablets (500 Units total) by mouth daily.     donepezil (ARICEPT) 10 MG tablet  Take 1 tablet (10 mg total) by mouth daily. 30 tablet 6   ELIQUIS 5 MG TABS tablet TAKE 1 TABLET BY MOUTH 2 TIMES DAILY. 180 tablet 0   furosemide (LASIX) 20 MG tablet Take 1 tablet (20 mg total) by mouth daily as needed for edema. 90 tablet 0   irbesartan (AVAPRO) 75 MG tablet TAKE 1 TABLET BY MOUTH IN THE MORNING AND 1/2 TABLET IN THE EVENING. 45 tablet 0   levothyroxine (SYNTHROID) 125 MCG tablet TAKE 1 TABLET (125 MCG TOTAL) BY MOUTH DAILY AT 6 AM. 90 tablet 0   meclizine (ANTIVERT) 25 MG tablet Take 0.5 tablets (12.5 mg total) by mouth 3 (three) times daily as needed for dizziness. 30 tablet 0   nitrofurantoin (MACRODANTIN) 50 MG capsule Take 1 capsule (50 mg total) by mouth 2 (two) times daily. 20 capsule 3   ondansetron (ZOFRAN) 4 MG tablet Take 1 tablet (4 mg total) by mouth every 6 (six) hours. (Patient taking differently: Take 4 mg by mouth every 8 (eight) hours as needed for nausea or vomiting.) 12 tablet 0   pantoprazole (PROTONIX) 40 MG tablet TAKE 1 TABLET BY MOUTH DAILY 90 tablet 1   QUEtiapine (SEROQUEL) 25 MG tablet Take 1 tablet (25 mg total) by mouth every other day. 90 tablet 1   sulfamethoxazole-trimethoprim (BACTRIM) 400-80 MG tablet Take 1 tablet by mouth 2 (two) times daily. 14 tablet 0   Vibegron (GEMTESA) 75 MG TABS Take 75 mg by mouth every other day. 28 tablet 0   vitamin B-12 (CYANOCOBALAMIN) 250 MCG tablet Take 1 tablet (250 mcg total) by mouth daily. 30 tablet 0   No current facility-administered medications on file prior to visit.    Allergies:   Allergies  Allergen Reactions   Gluten Meal Nausea And Vomiting    Patient has CELIAC DISEASE   Wheat Bran Nausea And Vomiting    Patient has CELIAC DISEASE   Adhesive [Tape] Other (See Comments)    Patient's skin is VERY THIN and tears very easily; please use either paper tape or Coban wrap   Codeine Nausea And Vomiting   Other Other (See Comments)    Patient has Hypoglycemia; her husband stated "when her  sugar  crashes, it crashes hard."   Penicillins Other (See Comments)    Doesn't remember reaction , but denies any SOB/Swelling States " it was a long time ago "     Physical Exam There were no vitals filed for this visit. There is no height or weight on file to calculate BMI.   General: Frail elderly Caucasian lady., seated, in no evident distress Head: head normocephalic and atraumatic.   Neck: supple with no carotid or supraclavicular bruits Cardiovascular: regular rate and rhythm, no murmurs Musculoskeletal: Severe kyphoscoliosis. Skin:  no rash/petichiae Vascular:  Normal pulses all extremities  Neurologic Exam Mental Status: Awake and fully alert. Oriented to place and time. Recent and remote memory intact. Attention span, concentration and fund of knowledge appropriate. Mood and affect appropriate.  Diminished recall 0/3.  Able to name only 10 animals which can walk on 4 legs.  Clock drawing is impaired 3/4.  Unable to copy intersecting pentagons.  Montreal overall cognitive assessment scale ( MOCA)scored on 11/30.  Able to name only 6 animals that can walk on 4 legs.  Clock drawing 2/4. Cranial Nerves: Pupils equal, briskly reactive to light. Extraocular movements full without nystagmus. Visual fields show dense right homonymous hemianopsia l to confrontation. Hearing intact. Facial sensation intact. Face, tongue, palate moves normally and symmetrically.  Motor: Normal bulk and tone. Normal strength in all tested extremity muscles. Sensory.: intact to touch , pinprick , position and vibratory sensation.  Coordination: Rapid alternating movements normal in all extremities. Finger-to-nose and heel-to-shin performed accurately bilaterally. Gait and Station: Arises from chair without difficulty.  Uses a walker stance is nearly stooped. . Gait demonstrates slight imbalance but uses a walker well..  Not able to heel, toe and tandem walk  Reflexes: 1+ and symmetric. Toes downgoing.        02/04/2022   10:55 AM 11/24/2020   11:28 AM  MMSE - Mini Mental State Exam  Orientation to time 0 5  Orientation to Place 4 5  Registration 3 3  Attention/ Calculation 1 3  Recall 0 2  Language- name 2 objects 2 2  Language- repeat 1 1  Language- follow 3 step command 3 3  Language- read & follow direction 1 1  Write a sentence 1 1  Copy design 0 0  Total score 16 26       ASSESSMENT/PLAN: 86 year old Caucasian lady with left posterior cerebral artery infarction secondary to left P2 occlusion in 06/2021 with significant residual right-sided homonymous hemianopsia as well as poststroke mild vascular dementia.  Multiple vascular risk factors of hypertension , hyperlipidemia and age.    Left PCA stroke -Continue Eliquis and atorvastatin for secondary stroke prevention measures and history of DVT -Continue close PCP follow-up for aggressive stroke risk factor management including BP goal<130/90, and HLD with LDL goal<70   Mild vascular dementia Age-related cognitive impairment -Continue Aricept *** -Denies benefit on Namenda -discussed memory compensation strategies and advised her to increase participation in cognitively challenging activities like solving crossword puzzles, playing bridge and sodoku.        I spent *** minutes of face-to-face and non-face-to-face time with patient.  This included previsit chart review, lab review, study review, order entry, electronic health record documentation, patient education   Ihor Austin, [facility]  [facility] [address] [place], [place] [address]  Phone [phone] Fax [phone] Note: This document was prepared with digital dictation and possible smart phrase technology. Any transcriptional errors that result from this process  are unintentional.

## 2022-06-30 ENCOUNTER — Encounter: Payer: Self-pay | Admitting: Adult Health

## 2022-06-30 ENCOUNTER — Ambulatory Visit: Payer: Medicare Other | Admitting: Adult Health

## 2022-06-30 VITALS — BP 157/74 | HR 77 | Ht 64.0 in | Wt 139.2 lb

## 2022-06-30 DIAGNOSIS — I69319 Unspecified symptoms and signs involving cognitive functions following cerebral infarction: Secondary | ICD-10-CM | POA: Diagnosis not present

## 2022-06-30 DIAGNOSIS — I63532 Cerebral infarction due to unspecified occlusion or stenosis of left posterior cerebral artery: Secondary | ICD-10-CM | POA: Diagnosis not present

## 2022-06-30 MED ORDER — ADLARITY 5 MG/DAY TD PTWK: 5.0000 mg | MEDICATED_PATCH | TRANSDERMAL | 5 refills | Status: DC

## 2022-06-30 NOTE — Patient Instructions (Signed)
Start Adlarity weekly transdermal patch - please keep me updated if too expensive or difficulty tolerating  Continue  Eliquis 5 mg twice daily   and atorvastatin 80 mg daily for secondary stroke prevention  Continue to follow up with PCP regarding cholesterol and blood pressure management  Maintain strict control of hypertension with blood pressure goal below 130/90 and cholesterol with LDL cholesterol (bad cholesterol) goal below 70 mg/dL.   Signs of a Stroke? Follow the BEFAST method:  Balance Watch for a sudden loss of balance, trouble with coordination or vertigo Eyes Is there a sudden loss of vision in one or both eyes? Or double vision?  Face: Ask the person to smile. Does one side of the face droop or is it numb?  Arms: Ask the person to raise both arms. Does one arm drift downward? Is there weakness or numbness of a leg? Speech: Ask the person to repeat a simple phrase. Does the speech sound slurred/strange? Is the person confused ? Time: If you observe any of these signs, call 911.     Followup in the future with me in 6 months or call earlier if needed       Thank you for coming to see [place] at [facility]. I hope we have been able to provide you high quality care today.  You may receive a patient satisfaction survey over the next few weeks. We would appreciate your feedback and comments so that we may continue to improve ourselves and the health of our patients.

## 2022-07-05 ENCOUNTER — Telehealth: Payer: Self-pay | Admitting: *Deleted

## 2022-07-05 DIAGNOSIS — I69319 Unspecified symptoms and signs involving cognitive functions following cerebral infarction: Secondary | ICD-10-CM

## 2022-07-05 DIAGNOSIS — F01A0 Vascular dementia, mild, without behavioral disturbance, psychotic disturbance, mood disturbance, and anxiety: Secondary | ICD-10-CM

## 2022-07-05 NOTE — Telephone Encounter (Signed)
Adlarity 5 mg PA, Key: [id number]. Your information has been sent to [facility].

## 2022-07-06 MED ORDER — RIVASTIGMINE 4.6 MG/24HR TD PT24: 4.6000 mg | MEDICATED_PATCH | Freq: Every day | TRANSDERMAL | 12 refills | Status: DC

## 2022-07-06 NOTE — Telephone Encounter (Signed)
LVM informing patient NP sent in new Rx due to insurance denial of patch. Advised this is a patch, replace every 24 hours to a different sight, monitor for skin irritation. Call for any questions, problems. Left #.

## 2022-07-06 NOTE — Telephone Encounter (Signed)
Adlarity is denied because it is not on your plan's Drug List (formulary). Medication authorization requires the following: (1) You need to try one (1) of these covered drugs: (a) Galantamine. (b) Rivastigmine patch*. (2) OR your doctor needs to give [place] specific medical reasons why one (1) of the covered drug(s) is not appropriate for you. Sent to NP for next steps.

## 2022-07-06 NOTE — Telephone Encounter (Signed)
Will try rivastigmine patch as required by insurance.  Recommend starting transdermal patch 4.6 mg every 24 hours, will need to replace with a new patch in a different location (usually upper or lower back) every 24 hours. Please monitor for skin irritation with use of patch, avoid placing patch on irritated areas of skin.

## 2022-07-06 NOTE — Addendum Note (Signed)
Addended by: Florian Buff C on: 07/06/2022 11:18 AM   Modules accepted: Orders

## 2022-07-08 ENCOUNTER — Ambulatory Visit (INDEPENDENT_AMBULATORY_CARE_PROVIDER_SITE_OTHER): Payer: Medicare Other | Admitting: Nurse Practitioner

## 2022-07-08 ENCOUNTER — Encounter: Payer: Self-pay | Admitting: Nurse Practitioner

## 2022-07-08 VITALS — BP 117/60 | HR 93 | Ht 64.0 in | Wt 139.0 lb

## 2022-07-08 DIAGNOSIS — N390 Urinary tract infection, site not specified: Secondary | ICD-10-CM | POA: Diagnosis not present

## 2022-07-08 DIAGNOSIS — Z8673 Personal history of transient ischemic attack (TIA), and cerebral infarction without residual deficits: Secondary | ICD-10-CM

## 2022-07-08 DIAGNOSIS — I10 Essential (primary) hypertension: Secondary | ICD-10-CM | POA: Diagnosis not present

## 2022-07-08 DIAGNOSIS — E039 Hypothyroidism, unspecified: Secondary | ICD-10-CM

## 2022-07-08 DIAGNOSIS — Z23 Encounter for immunization: Secondary | ICD-10-CM | POA: Diagnosis not present

## 2022-07-08 DIAGNOSIS — R7301 Impaired fasting glucose: Secondary | ICD-10-CM | POA: Diagnosis not present

## 2022-07-08 DIAGNOSIS — Z0000 Encounter for general adult medical examination without abnormal findings: Secondary | ICD-10-CM

## 2022-07-08 LAB — POCT URINALYSIS DIP (CLINITEK)
Bilirubin, UA: NEGATIVE
Blood, UA: NEGATIVE
Glucose, UA: NEGATIVE mg/dL
Ketones, POC UA: NEGATIVE mg/dL
Leukocytes, UA: NEGATIVE
Nitrite, UA: NEGATIVE
POC PROTEIN,UA: NEGATIVE
Spec Grav, UA: 1.015 (ref 1.010–1.025)
Urobilinogen, UA: 0.2 E.U./dL
pH, UA: 7 (ref 5.0–8.0)

## 2022-07-08 NOTE — Progress Notes (Signed)
Established patient visit   Patient: Caroline [name]   DOB: 22-Jul-1936   86 y.o. Female  MRN: [id number] Visit Date: 07/08/2022   Chief Complaint  Patient presents with   Follow-up   Subjective    HPI  The patient is here for follow up.  -uti - had to take Bactrim twice daily for 7 days.  -confusion improved since taking bactrim  -recheck u/a today  -due for routine labs. Will order Would like flu shot.     Medications: Outpatient Medications Prior to Visit  Medication Sig   acetaminophen (TYLENOL) 325 MG tablet Take 2 tablets (650 mg total) by mouth every 6 (six) hours as needed for mild pain, moderate pain, fever or headache.   Ascorbic Acid (VITAMIN C) 1000 MG tablet Take 1,000 mg by mouth daily.   atorvastatin (LIPITOR) 80 MG tablet TAKE 1 TABLET (80 MG TOTAL) BY MOUTH AT BEDTIME.   calcium carbonate ([employer] CALCIUM CARBONATE) 1250 (500 Ca) MG tablet Take 2 tablets (2,500 mg total) by mouth daily.   Catheters MISC Use as directed. 1 box of inserts and 1 box canisters.   cholecalciferol (VITAMIN D3) 25 MCG (1000 UNIT) tablet Take 0.5 tablets (500 Units total) by mouth daily.   ELIQUIS 5 MG TABS tablet TAKE 1 TABLET BY MOUTH 2 TIMES DAILY.   furosemide (LASIX) 20 MG tablet Take 1 tablet (20 mg total) by mouth daily as needed for edema.   irbesartan (AVAPRO) 75 MG tablet TAKE 1 TABLET BY MOUTH IN THE MORNING AND 1/2 TABLET IN THE EVENING.   levothyroxine (SYNTHROID) 125 MCG tablet TAKE 1 TABLET (125 MCG TOTAL) BY MOUTH DAILY AT 6 AM.   meclizine (ANTIVERT) 25 MG tablet Take 0.5 tablets (12.5 mg total) by mouth 3 (three) times daily as needed for dizziness.   nitrofurantoin (MACRODANTIN) 50 MG capsule Take 1 capsule (50 mg total) by mouth 2 (two) times daily.   ondansetron (ZOFRAN) 4 MG tablet Take 1 tablet (4 mg total) by mouth every 6 (six) hours. (Patient taking differently: Take 4 mg by mouth every 8 (eight) hours as needed for nausea or vomiting.)   pantoprazole (PROTONIX) 40 MG  tablet TAKE 1 TABLET BY MOUTH DAILY   QUEtiapine (SEROQUEL) 25 MG tablet Take 1 tablet (25 mg total) by mouth every other day.   sulfamethoxazole-trimethoprim (BACTRIM) 400-80 MG tablet Take 1 tablet by mouth 2 (two) times daily.   Vibegron (GEMTESA) 75 MG TABS Take 75 mg by mouth every other day.   vitamin B-12 (CYANOCOBALAMIN) 250 MCG tablet Take 1 tablet (250 mcg total) by mouth daily.   [DISCONTINUED] rivastigmine (EXELON) 4.6 mg/24hr Place 1 patch (4.6 mg total) onto the skin daily.   No facility-administered medications prior to visit.    Review of Systems  Constitutional:  Negative for activity change, appetite change, chills, fatigue and fever.  HENT:  Negative for congestion, postnasal drip, rhinorrhea, sinus pressure, sinus pain, sneezing and sore throat.   Eyes: Negative.   Respiratory:  Negative for cough, chest tightness, shortness of breath and wheezing.   Cardiovascular:  Negative for chest pain and palpitations.  Gastrointestinal:  Negative for abdominal pain, constipation, diarrhea, nausea and vomiting.  Endocrine: Negative for cold intolerance, heat intolerance, polydipsia and polyuria.       Due to have thyrodi check today   Genitourinary:  Negative for dyspareunia, dysuria, flank pain, frequency and urgency.       UTI symptoms seem to have improved.   Musculoskeletal:  Negative for  arthralgias, back pain and myalgias.  Skin:  Negative for rash.  Allergic/Immunologic: Negative for environmental allergies.  Neurological:  Negative for dizziness, weakness and headaches.  Hematological:  Negative for adenopathy.  Psychiatric/Behavioral:  The patient is not nervous/anxious.        Objective     Today's Vitals   07/08/22 0911  BP: 117/60  Pulse: 93  SpO2: 96%  Weight: 139 lb (63 kg)   Body mass index is 23.86 kg/m.   BP Readings from Last 3 Encounters:  07/08/22 117/60  06/30/22 (Abnormal) 157/74  06/17/22 (Abnormal) 172/48    Wt Readings from Last 3  Encounters:  07/08/22 139 lb (63 kg)  06/30/22 139 lb 4 oz (63.2 kg)  06/17/22 141 lb (64 kg)    Physical Exam Vitals and nursing note reviewed.  Constitutional:      Appearance: Normal appearance. She is well-developed.  HENT:     Head: Normocephalic and atraumatic.     Nose: Nose normal.     Mouth/Throat:     Mouth: Mucous membranes are moist.     Pharynx: Oropharynx is clear.  Eyes:     Extraocular Movements: Extraocular movements intact.     Conjunctiva/sclera: Conjunctivae normal.     Pupils: Pupils are equal, round, and reactive to light.  Cardiovascular:     Rate and Rhythm: Normal rate and regular rhythm.     Pulses: Normal pulses.     Heart sounds: Normal heart sounds.  Pulmonary:     Effort: Pulmonary effort is normal.     Breath sounds: Normal breath sounds.  Abdominal:     General: Bowel sounds are normal. There is no distension.     Palpations: Abdomen is soft. There is no mass.     Tenderness: There is no abdominal tenderness. There is no right CVA tenderness, left CVA tenderness, guarding or rebound.     Hernia: No hernia is present.  Genitourinary:    Comments: Urine sample negative for WBC or other abnormalities today  Musculoskeletal:        General: Normal range of motion.     Cervical back: Normal range of motion and neck supple.  Lymphadenopathy:     Cervical: No cervical adenopathy.  Skin:    General: Skin is warm and dry.     Capillary Refill: Capillary refill takes less than 2 seconds.  Neurological:     General: No focal deficit present.     Mental Status: She is alert and oriented to person, place, and time. Mental status is at baseline.  Psychiatric:        Mood and Affect: Mood normal.        Behavior: Behavior normal.        Thought Content: Thought content normal.        Judgment: Judgment normal.     Results for orders placed or performed in visit on 07/08/22  TSH + free T4  Result Value Ref Range   TSH 1.860 0.450 - 4.500 uIU/mL    Free T4 1.72 0.82 - 1.77 ng/dL  Hemoglobin A1c  Result Value Ref Range   Hgb A1c MFr Bld 5.6 4.8 - 5.6 %   Est. average glucose Bld gHb Est-mCnc 114 mg/dL  Lipid panel  Result Value Ref Range   Cholesterol, Total 150 100 - 199 mg/dL   Triglycerides 86 0 - 149 mg/dL   HDL 75 >39 mg/dL   VLDL Cholesterol Cal 16 5 - 40 mg/dL   LDL  Chol Calc (NIH) 59 0 - 99 mg/dL   Chol/HDL Ratio 2.0 0.0 - 4.4 ratio  Comprehensive metabolic panel  Result Value Ref Range   Glucose 68 (L) 70 - 99 mg/dL   BUN 13 8 - 27 mg/dL   Creatinine, Ser 4.66 0.57 - 1.00 mg/dL   eGFR 70 >59 DJ/TTS/1.77   BUN/Creatinine Ratio 16 12 - 28   Sodium 141 134 - 144 mmol/L   Potassium 4.6 3.5 - 5.2 mmol/L   Chloride 99 96 - 106 mmol/L   CO2 26 20 - 29 mmol/L   Calcium 9.8 8.7 - 10.3 mg/dL   Total Protein 7.1 6.0 - 8.5 g/dL   Albumin 4.8 (H) 3.7 - 4.7 g/dL   Globulin, Total 2.3 1.5 - 4.5 g/dL   Albumin/Globulin Ratio 2.1 1.2 - 2.2   Bilirubin Total 0.4 0.0 - 1.2 mg/dL   Alkaline Phosphatase 88 44 - 121 IU/L   AST 25 0 - 40 IU/L   ALT 21 0 - 32 IU/L  CBC  Result Value Ref Range   WBC 6.9 3.4 - 10.8 x10E3/uL   RBC 4.61 3.77 - 5.28 x10E6/uL   Hemoglobin 14.2 11.1 - 15.9 g/dL   Hematocrit 93.9 03.0 - 46.6 %   MCV 93 79 - 97 fL   MCH 30.8 26.6 - 33.0 pg   MCHC 33.2 31.5 - 35.7 g/dL   RDW 09.2 33.0 - 07.6 %   Platelets 245 150 - 450 x10E3/uL  POCT URINALYSIS DIP (CLINITEK)  Result Value Ref Range   Color, UA yellow yellow   Clarity, UA clear clear   Glucose, UA negative negative mg/dL   Bilirubin, UA negative negative   Ketones, POC UA negative negative mg/dL   Spec Grav, UA 2.263 3.354 - 1.025   Blood, UA negative negative   pH, UA 7.0 5.0 - 8.0   POC PROTEIN,UA negative negative, trace   Urobilinogen, UA 0.2 0.2 or 1.0 E.U./dL   Nitrite, UA Negative Negative   Leukocytes, UA Negative Negative    Assessment & Plan    1. Frequent UTI U/a negative for evidence of infection or other abnormalities today.  Follow up with urology as scheduled  - POCT URINALYSIS DIP (CLINITEK)  2. Acquired hypothyroidism Check thyroid panel today and adjust levothyroxine as indicated.  - TSH + free T4  3. Essential hypertension Stable. Continue bp medication as prescribed  - Hemoglobin A1c - Lipid panel - Comprehensive metabolic panel - CBC  4. Impaired fasting glucose Check HgbA1c today  - Hemoglobin A1c  5. History of CVA (cerebrovascular accident) Check fasting lipids and adjust treatment as indicated  - Lipid panel  6. Need for influenza vaccination Flu vaccine administered during today's visit.  - Flu Vaccine QUAD High Dose(Fluad)  7. Healthcare maintenance Routine, fasting labs drawn during today's visit.  - Comprehensive metabolic panel - CBC   Problem List Items Addressed This Visit       Cardiovascular and Mediastinum   Essential hypertension   Relevant Orders   Hemoglobin A1c (Completed)   Lipid panel (Completed)   Comprehensive metabolic panel (Completed)   CBC (Completed)     Endocrine   Hypothyroidism   Relevant Orders   TSH + free T4 (Completed)   Impaired fasting glucose   Relevant Orders   Hemoglobin A1c (Completed)     Genitourinary   Frequent UTI - Primary   Relevant Orders   POCT URINALYSIS DIP (CLINITEK) (Completed)   Other Visit Diagnoses  History of CVA (cerebrovascular accident)       Relevant Orders   Lipid panel (Completed)   Need for influenza vaccination       Relevant Orders   Flu Vaccine QUAD High Dose(Fluad) (Completed)   Healthcare maintenance       Relevant Orders   Comprehensive metabolic panel (Completed)   CBC (Completed)        Return in about 6 weeks (around 08/19/2022) for medicare wellness - HAS to be 40 minute appointment for this .         Ronnell Freshwater, NP  [facility] at [facility] [phone] (phone) [phone] (fax)  [place]

## 2022-07-09 LAB — TSH+FREE T4
Free T4: 1.72 ng/dL (ref 0.82–1.77)
TSH: 1.86 u[IU]/mL (ref 0.450–4.500)

## 2022-07-09 LAB — CBC
Hematocrit: 42.8 % (ref 34.0–46.6)
Hemoglobin: 14.2 g/dL (ref 11.1–15.9)
MCH: 30.8 pg (ref 26.6–33.0)
MCHC: 33.2 g/dL (ref 31.5–35.7)
MCV: 93 fL (ref 79–97)
Platelets: 245 10*3/uL (ref 150–450)
RBC: 4.61 x10E6/uL (ref 3.77–5.28)
RDW: 14.6 % (ref 11.7–15.4)
WBC: 6.9 10*3/uL (ref 3.4–10.8)

## 2022-07-09 LAB — COMPREHENSIVE METABOLIC PANEL
ALT: 21 IU/L (ref 0–32)
AST: 25 IU/L (ref 0–40)
Albumin/Globulin Ratio: 2.1 (ref 1.2–2.2)
Albumin: 4.8 g/dL — ABNORMAL HIGH (ref 3.7–4.7)
Alkaline Phosphatase: 88 IU/L (ref 44–121)
BUN/Creatinine Ratio: 16 (ref 12–28)
BUN: 13 mg/dL (ref 8–27)
Bilirubin Total: 0.4 mg/dL (ref 0.0–1.2)
CO2: 26 mmol/L (ref 20–29)
Calcium: 9.8 mg/dL (ref 8.7–10.3)
Chloride: 99 mmol/L (ref 96–106)
Creatinine, Ser: 0.82 mg/dL (ref 0.57–1.00)
Globulin, Total: 2.3 g/dL (ref 1.5–4.5)
Glucose: 68 mg/dL — ABNORMAL LOW (ref 70–99)
Potassium: 4.6 mmol/L (ref 3.5–5.2)
Sodium: 141 mmol/L (ref 134–144)
Total Protein: 7.1 g/dL (ref 6.0–8.5)
eGFR: 70 mL/min/{1.73_m2} (ref >59)

## 2022-07-09 LAB — LIPID PANEL
Chol/HDL Ratio: 2 ratio (ref 0.0–4.4)
Cholesterol, Total: 150 mg/dL (ref 100–199)
HDL: 75 mg/dL (ref >39)
LDL Chol Calc (NIH): 59 mg/dL (ref 0–99)
Triglycerides: 86 mg/dL (ref 0–149)
VLDL Cholesterol Cal: 16 mg/dL (ref 5–40)

## 2022-07-09 LAB — HEMOGLOBIN A1C
Est. average glucose Bld gHb Est-mCnc: 114 mg/dL
Hgb A1c MFr Bld: 5.6 % (ref 4.8–5.6)

## 2022-07-12 ENCOUNTER — Telehealth: Payer: Self-pay | Admitting: *Deleted

## 2022-07-12 MED ORDER — RIVASTIGMINE TARTRATE 1.5 MG PO CAPS: 1.5000 mg | ORAL_CAPSULE | Freq: Two times a day (BID) | ORAL | 5 refills | Status: DC

## 2022-07-12 NOTE — Telephone Encounter (Signed)
LVM informing husband NP ordered rivastigmine capsule for patient due to insurance denying patch. Adivsed he call if patient has any problems with new medication. Left #.

## 2022-07-12 NOTE — Telephone Encounter (Signed)
Rivastigmine patch is denied for not meeting the step therapy requirement. Medication authorization requires the following: One of the following: (1) You need to try rivastigmine capsule; OR (2) Your doctor needs to give [place] specific medical reasons why you cannot take the drug. Sent to NP.

## 2022-07-12 NOTE — Telephone Encounter (Signed)
Order placed for capsule formulation, previously placed as patch as that is what insurance previously stated they required before trialing Aldarity. Please ensure patient calls office with any difficulty tolerating. Thank you.

## 2022-07-12 NOTE — Telephone Encounter (Signed)
Rivastigmine patch PA, Key: [id number], failed namenda, aricept - diarrhea. Your information has been sent to [facility].

## 2022-07-13 NOTE — Telephone Encounter (Signed)
Pt's husband, Shantika Bermea, Do I stop the medication she is currently on

## 2022-07-13 NOTE — Telephone Encounter (Signed)
Contacted spouse back, no answer   Adlarity got denied by insurance, NP ordered rivastigmine capsule instead. No other medications have been changed.

## 2022-07-16 ENCOUNTER — Encounter: Payer: Self-pay | Admitting: Adult Health

## 2022-07-16 ENCOUNTER — Ambulatory Visit
Admission: EM | Admit: 2022-07-16 | Discharge: 2022-07-16 | Discharge status: Against Medical Advice | Payer: Medicare Other

## 2022-07-19 NOTE — Progress Notes (Signed)
Mildly low glucose. Needs to eat more frequent, high protein meals. Other labs are essentially normal. Discuss at visit 08/20/2022.

## 2022-07-19 NOTE — Telephone Encounter (Signed)
Yes please advise to stop medication and keep [place] updated. If confusion persists, would recommend proceeding to ED but suspect in setting of medication use.

## 2022-07-19 NOTE — Telephone Encounter (Signed)
No reason for follow up visit at this time if doing better. Likely side effect of medication. Should be able to get Aldarity covered at this point if they are wanting to try that.

## 2022-07-25 DIAGNOSIS — R7301 Impaired fasting glucose: Secondary | ICD-10-CM | POA: Insufficient documentation

## 2022-07-26 ENCOUNTER — Other Ambulatory Visit: Payer: Self-pay | Admitting: Nurse Practitioner

## 2022-07-26 ENCOUNTER — Telehealth: Payer: Self-pay

## 2022-07-26 NOTE — Telephone Encounter (Signed)
RX was sent

## 2022-07-26 NOTE — Telephone Encounter (Signed)
Pt daughter called requesting a refill on: QUEtiapine (SEROQUEL) 25 MG tablet  Pharmacy: [facility], [place] 07/08/22 ROV 08/20/22  Pt daughter is asking for a increase in the quantity due to pt some times taking 2 a day.  Please advise

## 2022-07-28 ENCOUNTER — Other Ambulatory Visit: Payer: Self-pay | Admitting: Physician Assistant

## 2022-07-28 ENCOUNTER — Other Ambulatory Visit: Payer: Self-pay | Admitting: Nurse Practitioner

## 2022-07-28 DIAGNOSIS — I82412 Acute embolism and thrombosis of left femoral vein: Secondary | ICD-10-CM

## 2022-07-28 DIAGNOSIS — E785 Hyperlipidemia, unspecified: Secondary | ICD-10-CM

## 2022-07-28 DIAGNOSIS — E039 Hypothyroidism, unspecified: Secondary | ICD-10-CM

## 2022-07-28 DIAGNOSIS — I10 Essential (primary) hypertension: Secondary | ICD-10-CM

## 2022-08-20 ENCOUNTER — Other Ambulatory Visit: Payer: Self-pay

## 2022-08-20 ENCOUNTER — Ambulatory Visit: Payer: Medicare Other | Admitting: Nurse Practitioner

## 2022-08-20 DIAGNOSIS — Z8673 Personal history of transient ischemic attack (TIA), and cerebral infarction without residual deficits: Secondary | ICD-10-CM

## 2022-08-20 DIAGNOSIS — R7301 Impaired fasting glucose: Secondary | ICD-10-CM

## 2022-08-20 DIAGNOSIS — E039 Hypothyroidism, unspecified: Secondary | ICD-10-CM

## 2022-08-20 DIAGNOSIS — I10 Essential (primary) hypertension: Secondary | ICD-10-CM

## 2022-08-20 DIAGNOSIS — Z0000 Encounter for general adult medical examination without abnormal findings: Secondary | ICD-10-CM

## 2022-08-23 ENCOUNTER — Other Ambulatory Visit: Payer: Medicare Other

## 2022-08-23 DIAGNOSIS — Z0000 Encounter for general adult medical examination without abnormal findings: Secondary | ICD-10-CM

## 2022-08-23 DIAGNOSIS — E039 Hypothyroidism, unspecified: Secondary | ICD-10-CM

## 2022-08-23 DIAGNOSIS — Z8673 Personal history of transient ischemic attack (TIA), and cerebral infarction without residual deficits: Secondary | ICD-10-CM

## 2022-08-23 DIAGNOSIS — I10 Essential (primary) hypertension: Secondary | ICD-10-CM

## 2022-08-23 DIAGNOSIS — R7301 Impaired fasting glucose: Secondary | ICD-10-CM

## 2022-08-24 LAB — COMPREHENSIVE METABOLIC PANEL
ALT: 24 IU/L (ref 0–32)
AST: 24 IU/L (ref 0–40)
Albumin/Globulin Ratio: 1.9 (ref 1.2–2.2)
Albumin: 4.4 g/dL (ref 3.7–4.7)
Alkaline Phosphatase: 98 IU/L (ref 44–121)
BUN/Creatinine Ratio: 18 (ref 12–28)
BUN: 17 mg/dL (ref 8–27)
Bilirubin Total: 0.3 mg/dL (ref 0.0–1.2)
CO2: 24 mmol/L (ref 20–29)
Calcium: 9.5 mg/dL (ref 8.7–10.3)
Chloride: 103 mmol/L (ref 96–106)
Creatinine, Ser: 0.94 mg/dL (ref 0.57–1.00)
Globulin, Total: 2.3 g/dL (ref 1.5–4.5)
Glucose: 86 mg/dL (ref 70–99)
Potassium: 5 mmol/L (ref 3.5–5.2)
Sodium: 140 mmol/L (ref 134–144)
Total Protein: 6.7 g/dL (ref 6.0–8.5)
eGFR: 60 mL/min/{1.73_m2} (ref >59)

## 2022-08-24 LAB — CBC
Hematocrit: 42.5 % (ref 34.0–46.6)
Hemoglobin: 13.9 g/dL (ref 11.1–15.9)
MCH: 31.7 pg (ref 26.6–33.0)
MCHC: 32.7 g/dL (ref 31.5–35.7)
MCV: 97 fL (ref 79–97)
Platelets: 267 10*3/uL (ref 150–450)
RBC: 4.38 x10E6/uL (ref 3.77–5.28)
RDW: 13.2 % (ref 11.7–15.4)
WBC: 6.1 10*3/uL (ref 3.4–10.8)

## 2022-08-24 LAB — LIPID PANEL
Chol/HDL Ratio: 2 ratio (ref 0.0–4.4)
Cholesterol, Total: 160 mg/dL (ref 100–199)
HDL: 82 mg/dL (ref >39)
LDL Chol Calc (NIH): 61 mg/dL (ref 0–99)
Triglycerides: 93 mg/dL (ref 0–149)
VLDL Cholesterol Cal: 17 mg/dL (ref 5–40)

## 2022-08-24 LAB — TSH+FREE T4
Free T4: 1.81 ng/dL — ABNORMAL HIGH (ref 0.82–1.77)
TSH: 1.61 u[IU]/mL (ref 0.450–4.500)

## 2022-08-24 LAB — HEMOGLOBIN A1C
Est. average glucose Bld gHb Est-mCnc: 114 mg/dL
Hgb A1c MFr Bld: 5.6 % (ref 4.8–5.6)

## 2022-08-30 ENCOUNTER — Encounter: Payer: Self-pay | Admitting: Nurse Practitioner

## 2022-08-30 ENCOUNTER — Ambulatory Visit (INDEPENDENT_AMBULATORY_CARE_PROVIDER_SITE_OTHER): Payer: Medicare Other | Admitting: Nurse Practitioner

## 2022-08-30 VITALS — BP 137/65 | HR 84 | Ht 64.0 in | Wt 146.1 lb

## 2022-08-30 DIAGNOSIS — N390 Urinary tract infection, site not specified: Secondary | ICD-10-CM | POA: Diagnosis not present

## 2022-08-30 DIAGNOSIS — E039 Hypothyroidism, unspecified: Secondary | ICD-10-CM

## 2022-08-30 DIAGNOSIS — Z0000 Encounter for general adult medical examination without abnormal findings: Secondary | ICD-10-CM | POA: Diagnosis not present

## 2022-08-30 DIAGNOSIS — I10 Essential (primary) hypertension: Secondary | ICD-10-CM | POA: Diagnosis not present

## 2022-08-30 DIAGNOSIS — F05 Delirium due to known physiological condition: Secondary | ICD-10-CM

## 2022-08-30 NOTE — Progress Notes (Signed)
Subjective:   Caroline [name] is a 86 y.o. female who presents for Medicare Annual (Subsequent) preventive examination. -needs Dexa scan -?pneumonia and shingles vaccines - should get at pharmacy  -seeing neurology due to memory and neurological issues following stroke.  -seeing urology due to overflow/stress incontinence and chronic UTI -routine, sating labs done prior to today.  -mildly elevated Free T4 with normal TSH  -normal lipids.  -normal blood sugar   Review of Systems    Review of Systems  Constitutional:  Negative for chills, fever and malaise/fatigue.  HENT:  Negative for congestion, sinus pain and sore throat.   Eyes: Negative.   Respiratory:  Negative for cough, shortness of breath and wheezing.   Cardiovascular:  Positive for leg swelling. Negative for chest pain and palpitations.  Gastrointestinal:  Negative for constipation, diarrhea, nausea and vomiting.  Genitourinary:        Frequent urinary tract infections.   Musculoskeletal:  Negative for myalgias.  Skin: Negative.   Neurological:  Positive for weakness. Negative for dizziness and headaches.  Endo/Heme/Allergies:  Does not bruise/bleed easily.  Psychiatric/Behavioral:  Negative for depression. The patient is nervous/anxious.           Objective:    Today's Vitals   08/30/22 0926  BP: 137/65  Pulse: 84  SpO2: 96%  Weight: 146 lb 1.9 oz (66.3 kg)  Height:  (1.626 m)   Body mass index is 25.08 kg/m.    Row Labels 06/17/2022   10:06 AM 05/29/2022   10:08 AM 12/28/2021   11:10 AM 06/24/2021    1:25 PM 06/17/2021    8:00 PM 06/15/2021   12:20 PM 05/26/2021    2:15 PM  Advanced Directives   Section Header. No data exists in this row.         Does Patient Have a Medical Advance Directive?   Yes No Yes No No No No  Type of Best boy of [place];Living will  Healthcare Power of [place];Living will      Does patient want to make changes to medical advance directive?     No -  Patient declined      Would patient like information on creating a medical advance directive?    No - Patient declined  No - Patient declined No - Patient declined No - Patient declined No - Patient declined    Current Medications (verified) Outpatient Encounter Medications as of 08/30/2022  Medication Sig   acetaminophen (TYLENOL) 325 MG tablet Take 2 tablets (650 mg total) by mouth every 6 (six) hours as needed for mild pain, moderate pain, fever or headache.   Ascorbic Acid (VITAMIN C) 1000 MG tablet Take 1,000 mg by mouth daily.   atorvastatin (LIPITOR) 80 MG tablet TAKE 1 TABLET (80 MG TOTAL) BY MOUTH AT BEDTIME.   calcium carbonate ([employer] CALCIUM CARBONATE) 1250 (500 Ca) MG tablet Take 2 tablets (2,500 mg total) by mouth daily.   Catheters MISC Use as directed. 1 box of inserts and 1 box canisters.   cholecalciferol (VITAMIN D3) 25 MCG (1000 UNIT) tablet Take 0.5 tablets (500 Units total) by mouth daily.   ELIQUIS 5 MG TABS tablet TAKE 1 TABLET BY MOUTH 2 TIMES DAILY.   furosemide (LASIX) 20 MG tablet Take 1 tablet (20 mg total) by mouth daily as needed for edema.   irbesartan (AVAPRO) 75 MG tablet TAKE 1 TABLET (75 MG TOTAL) BY MOUTH DAILY.   levothyroxine (SYNTHROID) 125 MCG tablet TAKE 1  TABLET (125 MCG TOTAL) BY MOUTH DAILY AT 6 AM.   meclizine (ANTIVERT) 25 MG tablet Take 0.5 tablets (12.5 mg total) by mouth 3 (three) times daily as needed for dizziness.   nitrofurantoin (MACRODANTIN) 50 MG capsule Take 1 capsule (50 mg total) by mouth 2 (two) times daily.   ondansetron (ZOFRAN) 4 MG tablet Take 1 tablet (4 mg total) by mouth every 6 (six) hours. (Patient taking differently: Take 4 mg by mouth every 8 (eight) hours as needed for nausea or vomiting.)   pantoprazole (PROTONIX) 40 MG tablet TAKE 1 TABLET BY MOUTH DAILY   QUEtiapine (SEROQUEL) 25 MG tablet TAKE 1 TABLET (25 MG TOTAL) BY MOUTH AT BEDTIME.   rivastigmine (EXELON) 1.5 MG capsule Take 1 capsule (1.5 mg total) by mouth 2 (two)  times daily.   sulfamethoxazole-trimethoprim (BACTRIM) 400-80 MG tablet Take 1 tablet by mouth 2 (two) times daily.   Vibegron (GEMTESA) 75 MG TABS Take 75 mg by mouth every other day.   vitamin B-12 (CYANOCOBALAMIN) 250 MCG tablet Take 1 tablet (250 mcg total) by mouth daily.   No facility-administered encounter medications on file as of 08/30/2022.    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 Cheek   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   Bridge Left Nose, and Left Neck   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 Cheek Tx: curret x 3 and 5FU   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   Bridge of nose tx Cx3 and 5FU, Left neck Tx Cx3 and 5FU   Thrombophlebitis    Past Surgical History:  Procedure Laterality Date   ABDOMINAL HYSTERECTOMY  1979   CATARACT EXTRACTION, BILATERAL     knee surgery Left    VARICOSE VEIN SURGERY Left    XI ROBOTIC ASSISTED PARAESOPHAGEAL HERNIA REPAIR N/A 06/15/2021   Procedure: XI ROBOTIC ASSISTED PARAESOPHAGEAL HERNIA REPAIR, GASTROPEXY, UPPER ENDOSCOPY;  Surgeon: Berna Bue, MD;  Location: [facility];  Service: General;  Laterality: N/A;   Family History  Problem Relation Age of Onset   Arthritis Mother        pt denies   Hypertension Mother        pt denies   Obesity Sister    Cancer Maternal Uncle         type unknown   Colon cancer Neg Hx    Stomach cancer Neg Hx    Esophageal cancer Neg Hx    Pancreatic cancer Neg Hx    Social History   Socioeconomic History   Marital status: Married    Spouse name: [name]   Number of children: 3   Years of education: 12   Highest education level: Not on file  Occupational History   Occupation: retired  Tobacco Use   Smoking status: Never   Smokeless tobacco: Never  Vaping Use   Vaping Use: Never used  Substance and Sexual Activity   Alcohol use: No   Drug use: Not Currently   Sexual activity: Never  Other Topics Concern   Not on file  Social History Narrative   Lives with husband   Right Handed   Drinks no caffeine   Social Determinants of Health   Financial Resource Strain: Low Risk  (08/30/2022)   Overall Financial Resource Strain (CARDIA)    Difficulty of Paying Living Expenses: Not hard at all  Food Insecurity: No Food Insecurity (08/30/2022)   Hunger Vital Sign    Worried About Running Out of Food in the Last Year: Never true    Ran Out of Food in the Last Year: Never true  Transportation Needs: No Transportation Needs (08/30/2022)   PRAPARE - Administrator, Civil Service (Medical): No    Lack of Transportation (Non-Medical): No  Physical Activity: Insufficiently Active (08/30/2022)   Exercise Vital Sign    Days of Exercise per Week: 2 days    Minutes of Exercise per Session: 20 min  Stress: Stress Concern Present (08/30/2022)   [employer] of Occupational Health - Occupational Stress Questionnaire    Feeling of Stress : To some extent  Social Connections: Moderately Isolated (08/30/2022)   Social Connection and Isolation Panel [NHANES]    Frequency of Communication with Friends and Family: More than three times a week    Frequency of Social Gatherings with Friends and Family: Twice a week    Attends Religious Services: Never    Database administrator or Organizations: No    Attends Tax inspector Meetings: Never    Marital Status: Married   Physical Exam Vitals and nursing note reviewed.  Constitutional:      Appearance: Normal appearance. She is well-developed.  HENT:     Head: Normocephalic and atraumatic.     Right Ear: Tympanic membrane, ear canal and external ear normal.     Left Ear: Tympanic membrane, ear canal and external ear normal.     Nose: Nose normal.     Mouth/Throat:     Mouth: Mucous membranes are moist.     Pharynx: Oropharynx is clear.  Eyes:     Extraocular Movements: Extraocular movements intact.     Conjunctiva/sclera: Conjunctivae normal.     Pupils: Pupils are equal, round, and reactive to light.  Neck:     Vascular: No carotid bruit.  Cardiovascular:     Rate and Rhythm: Normal rate and regular rhythm.     Pulses: Normal pulses.     Heart sounds: Normal heart sounds.  Pulmonary:     Effort: Pulmonary effort is normal.     Breath sounds: Normal breath sounds.  Abdominal:     General: Bowel sounds are normal. There is no distension.     Palpations: Abdomen is soft. There is no mass.     Tenderness: There is no abdominal tenderness. There is no right CVA tenderness, left CVA tenderness, guarding or rebound.     Hernia: No hernia is present.  Musculoskeletal:        General: Normal range of motion.     Cervical back: Normal range of motion and neck supple.  Lymphadenopathy:     Cervical: No cervical adenopathy.  Skin:    General: Skin is warm and dry.     Capillary Refill: Capillary refill  takes less than 2 seconds.  Neurological:     General: No focal deficit present.     Mental Status: She is alert and oriented to person, place, and time. Mental status is at baseline.  Psychiatric:        Mood and Affect: Mood normal.        Behavior: Behavior normal.        Thought Content: Thought content normal.        Judgment: Judgment normal.      Tobacco Counseling Counseling given: Not Answered   Clinical Intake:  Pre-visit  preparation completed: Yes  Pain : No/denies pain     BMI - recorded: 25.08 Nutritional Status: BMI 25 -29 Overweight  How often do you need to have someone help you when you read instructions, pamphlets, or other written materials from your doctor or pharmacy?: 4 - Often  Diabetic?no  Interpreter Needed?: No      Activities of Daily Living   Row Labels 08/30/2022    9:43 AM 05/27/2022    2:17 PM  In your present state of health, do you have any difficulty performing the following activities:   Section Header. No data exists in this row.    Hearing?   0 0  Vision?   1 1  Difficulty concentrating or making decisions?   1 1  Walking or climbing stairs?   1 1  Dressing or bathing?   1 1  Doing errands, shopping?   1 1    Patient Care Team: Carlean Jews, NP as PCP - General (Family Medicine)  Indicate any recent Medical Services you may have received from other than [facility] providers in the past year (date may be approximate).     Assessment:   1. Encounter for Medicare annual wellness exam Annual medicare wellness visit today   2. Essential hypertension Stable. Continue bp medication as prescribed   3. Frequent UTI Urine clear for some time. She should continue regular visits with urology and medication as prescribed   4. Acquired hypothyroidism Thyroid stable. Continue levothyroxine as prescribed   5. Delirium due to multiple etiologies Stable. Continue reguolar visits with  neurology and medication as prescribed    Hearing/Vision screen No results found.  Depression Screen   Row Labels 08/30/2022    9:44 AM 05/27/2022    2:16 PM 01/20/2022    1:40 PM 12/10/2021   10:48 AM 10/21/2021    2:00 PM 07/28/2021    1:35 PM 07/21/2021    1:39 PM  PHQ 2/9 Scores   Section Header. No data exists in this row.         PHQ - 2 Score   1 2 1 2  0 2 2  PHQ- 9 Score   4 9 3   0 11 8    Fall Risk   Row Labels 06/17/2022   10:06 AM 05/27/2022    2:16 PM 01/20/2022     1:41 PM 12/10/2021   10:47 AM 10/21/2021    2:01 PM  Fall Risk    Section Header. No data exists in this row.       Falls in the past year?   0 0 1 1 1   Number falls in past yr:    0 1 0 0  Comment      mid February 2023   Injury with Fall?    0 0 0 1  Comment      bruised only   Risk for fall  due to :    Impaired balance/gait;Impaired mobility  Impaired balance/gait   Follow up    Falls evaluation completed Falls evaluation completed  Falls evaluation completed    FALL RISK PREVENTION PERTAINING TO THE HOME:  Any stairs in or around the home? Yes  If so, are there any without handrails? No  Home free of loose throw rugs in walkways, pet beds, electrical cords, etc? Yes  Adequate lighting in your home to reduce risk of falls? Yes   ASSISTIVE DEVICES UTILIZED TO PREVENT FALLS:  Life alert? No  Use of a cane, walker or w/c? Yes  Grab bars in the bathroom? Yes  Shower chair or bench in shower? Yes  Elevated toilet seat or a handicapped toilet? Yes   TIMED UP AND GO:  Was the test performed? Yes .  Length of time to ambulate 10 feet: 17 sec.   Gait slow and steady with assistive device  Cognitive Function:   Row Labels 06/30/2022    2:13 PM 02/04/2022   10:55 AM 11/24/2020   11:28 AM  MMSE - Mini Mental State Exam   Section Header. No data exists in this row.     Orientation to time   1 0 5  Orientation to Place   Registration   Attention/ Calculation   Recall   0 0 2  Language- name 2 objects   Language- repeat   Language- follow 3 step command   Language- read & follow direction   Write a sentence   Copy design   0 0 0  Total score   Row Labels 12/09/2021    3:32 PM  Montreal Cognitive Assessment    Section Header. No data exists in this row.   Visuospatial/ Executive (0/5)   0  Naming (0/3)   2  Attention: Read list of digits (0/2)   2  Attention: Read list of letters (0/1)   0  Attention: Serial  7 subtraction starting at 100 (0/3)   2  Language: Repeat phrase (0/2)   0  Language : Fluency (0/1)   0  Abstraction (0/2)   2  Delayed Recall (0/5)   0  Orientation (0/6)   3  Total   11  Adjusted Score (based on education)   11     Row Labels 08/30/2022    9:28 AM  6CIT Screen   Section Header. No data exists in this row.   What Year?   4 points  What month?   3 points  What time?   0 points  Count back from 20   0 points  Months in reverse   4 points  Repeat phrase   4 points  Total Score   15 points    Immunizations Immunization History  Administered Date(s) Administered   Fluad Quad(high Dose 65+) 08/04/2021, 07/08/2022   PFIZER(Purple Top)SARS-COV-2 Vaccination 10/29/2019, 11/19/2019, 09/15/2020   PNEUMOCOCCAL CONJUGATE-20 02/12/2022   Zoster Recombinat (Shingrix) 11/07/2020    TDAP status: Due, Education has been provided regarding the importance of this vaccine. Advised may receive this vaccine at local pharmacy or Health Dept. Aware to provide a copy of the vaccination record if obtained from local pharmacy or Health Dept. Verbalized acceptance and understanding.  Flu Vaccine status: Up  to date  Pneumococcal vaccine status: Due, Education has been provided regarding the importance of this vaccine. Advised may receive this vaccine at local pharmacy or Health Dept. Aware to provide a copy of the vaccination record if obtained from local pharmacy or Health Dept. Verbalized acceptance and understanding.  Covid-19 vaccine status: Completed vaccines  Qualifies for Shingles Vaccine? No   Zostavax completed No   Shingrix Completed?: No.    Education has been provided regarding the importance of this vaccine. Patient has been advised to call insurance company to determine out of pocket expense if they have not yet received this vaccine. Advised may also receive vaccine at local pharmacy or Health Dept. Verbalized acceptance and understanding.  Screening Tests Health  Maintenance  Topic Date Due   DTaP/Tdap/Td (1 - Tdap) Never done   Zoster Vaccines- Shingrix (2 of 2) 01/02/2021   COVID-19 Vaccine (4 - 2023-24 season) 06/04/2022   Medicare Annual Wellness (AWV)  08/31/2023   Pneumonia Vaccine 6165+ Years old  Completed   INFLUENZA VACCINE  Completed   HPV VACCINES  Aged Out   DEXA SCAN  Discontinued    Health Maintenance  Health Maintenance Due  Topic Date Due   DTaP/Tdap/Td (1 - Tdap) Never done   Zoster Vaccines- Shingrix (2 of 2) 01/02/2021   COVID-19 Vaccine (4 - 2023-24 season) 06/04/2022    Colorectal cancer screening: Type of screening: Colonoscopy. Completed n/a. Repeat every n/a years  Mammogram status: No longer required due to age out.  Bone Density status: Completed n/a. Results reflect: Bone density results: OSTEOPOROSIS. Repeat every 0 years.  Lung Cancer Screening: (Low Dose CT Chest recommended if Age 42-80 years, 30 pack-year currently smoking OR have quit w/in 15years.) does not qualify.    Additional Screening:  Hepatitis C Screening: does not qualify; Completed 0  Vision Screening: Recommended annual ophthalmology exams for early detection of glaucoma and other disorders of the eye. Is the patient up to date with their annual eye exam?  Yes  Who is the provider or what is the name of the office in which the patient attends annual eye exams? Dr, Nile RiggsShapiro If pt is not established with a provider, would they like to be referred to a provider to establish care? No .   Dental Screening: Recommended annual dental exams for proper oral hygiene  Community Resource Referral / Chronic Care Management: CRR required this visit?  No   CCM required this visit?  No      Plan:     I have personally reviewed and noted the following in the patient's chart:   Medical and social history Use of alcohol, tobacco or illicit drugs  Current medications and supplements including opioid prescriptions. Patient is not currently taking  opioid prescriptions. Functional ability and status Nutritional status Physical activity Advanced directives List of other physicians Hospitalizations, surgeries, and ER visits in previous 12 months Vitals Screenings to include cognitive, depression, and falls Referrals and appointments  In addition, I have reviewed and discussed with patient certain preventive protocols, quality metrics, and best practice recommendations. A written personalized care plan for preventive services as well as general preventive health recommendations were provided to patient.   Carlean JewsHeather E Jasline Buskirk, NP   09/05/2022   Nurse Notes: face to face 25 min

## 2022-09-21 ENCOUNTER — Ambulatory Visit: Payer: Medicare Other | Admitting: Podiatry

## 2022-10-05 ENCOUNTER — Other Ambulatory Visit: Payer: Self-pay | Admitting: Neurology

## 2022-10-05 ENCOUNTER — Other Ambulatory Visit: Payer: Self-pay | Admitting: Nurse Practitioner

## 2022-10-05 DIAGNOSIS — I82412 Acute embolism and thrombosis of left femoral vein: Secondary | ICD-10-CM

## 2022-10-06 ENCOUNTER — Telehealth: Payer: Self-pay | Admitting: Adult Health

## 2022-10-06 MED ORDER — DONEPEZIL HCL 10 MG PO TABS: 10.0000 mg | ORAL_TABLET | Freq: Every day | ORAL | 6 refills | Status: DC

## 2022-10-06 NOTE — Telephone Encounter (Signed)
Contacted pt spouse back, LVM rq CB  .  Per last visit 06/30/22 -will change oral donepezil to transdermal patch Adlarity, Adlarity got denied.  Rivastigmine got prescribed, pt had intolerance and was told to stop via [facility] 07/16/22. Wanted to retry for Adlarity at that point and we didn't hear back from family if they wanted to trial it or not.

## 2022-10-06 NOTE — Telephone Encounter (Signed)
Pt's spouse has called re: the donepezil (ARICEPT) 10 MG tablet , Request refused: Refill not appropriate (change in therapy) was relayed to him.  Spouse is asking for a call to discuss this. He says the other medication made pt sick and almost caused pt to be hospitalized.

## 2022-10-06 NOTE — Telephone Encounter (Signed)
Pt spouse called back frustrated, discussed previous note with him. Informed him from discussion with their daughter Claiborne Billings in October, we were not informed or reached back out to on how they wanted to proceed, which is also why Aricept 10mg  got denied due to change in therapy. He has been giving her the remaining of previous Rx, she has tolerated it fine and wish to continue it. I have reordered the medication, if you disagree please advise.

## 2022-10-06 NOTE — Addendum Note (Signed)
Addended by: Gertie Baron D on: 10/06/2022 01:57 PM   Modules accepted: Orders

## 2022-10-06 NOTE — Telephone Encounter (Signed)
That is fine.   Thank you

## 2022-10-11 ENCOUNTER — Ambulatory Visit: Payer: Medicare HMO | Admitting: Podiatry

## 2022-10-11 ENCOUNTER — Encounter: Payer: Self-pay | Admitting: Podiatry

## 2022-10-11 DIAGNOSIS — B351 Tinea unguium: Secondary | ICD-10-CM

## 2022-10-11 DIAGNOSIS — D689 Coagulation defect, unspecified: Secondary | ICD-10-CM

## 2022-10-11 DIAGNOSIS — M79675 Pain in left toe(s): Secondary | ICD-10-CM | POA: Diagnosis not present

## 2022-10-11 DIAGNOSIS — M79674 Pain in right toe(s): Secondary | ICD-10-CM | POA: Diagnosis not present

## 2022-10-11 NOTE — Progress Notes (Signed)
This patient returns to my office for at risk foot care.  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  This patient is unable to cut nails herself since the patient cannot reach her nails.These nails are painful walking and wearing shoes.  This patient presents for at risk foot care today.  General Appearance  Alert, conversant and in no acute stress.  Vascular  Dorsalis pedis and posterior tibial  pulses are palpable  bilaterally.  Capillary return is within normal limits  bilaterally. Temperature is within normal limits  bilaterally.  Neurologic  Senn-Weinstein monofilament wire test within normal limits  bilaterally. Muscle power within normal limits bilaterally.  Nails Thick disfigured discolored nails with subungual debris  from hallux to fifth toes bilaterally. No evidence of bacterial infection or drainage bilaterally.  Orthopedic  No limitations of motion  feet .  No crepitus or effusions noted.  No bony pathology or digital deformities noted.  Skin  normotropic skin with no porokeratosis noted bilaterally.  No signs of infections or ulcers noted.     Onychomycosis  Pain in right toes  Pain in left toes  Consent was obtained for treatment procedures.   Mechanical debridement of nails 1-5  bilaterally performed with a nail nipper.  Filed with dremel without incident.    Return office visit    3 months                  Told patient to return for periodic foot care and evaluation due to potential at risk complications.   Gardiner Barefoot DPM

## 2022-10-12 ENCOUNTER — Ambulatory Visit: Payer: Self-pay | Admitting: Podiatry

## 2022-10-15 ENCOUNTER — Ambulatory Visit: Payer: Self-pay | Admitting: Podiatry

## 2022-10-19 DIAGNOSIS — M419 Scoliosis, unspecified: Secondary | ICD-10-CM | POA: Diagnosis not present

## 2022-10-19 DIAGNOSIS — M47816 Spondylosis without myelopathy or radiculopathy, lumbar region: Secondary | ICD-10-CM | POA: Diagnosis not present

## 2022-11-12 ENCOUNTER — Other Ambulatory Visit: Payer: Self-pay | Admitting: Nurse Practitioner

## 2022-11-12 DIAGNOSIS — E039 Hypothyroidism, unspecified: Secondary | ICD-10-CM

## 2022-11-13 ENCOUNTER — Other Ambulatory Visit: Payer: Self-pay | Admitting: Nurse Practitioner

## 2022-11-13 DIAGNOSIS — K219 Gastro-esophageal reflux disease without esophagitis: Secondary | ICD-10-CM

## 2022-11-29 ENCOUNTER — Other Ambulatory Visit: Payer: Self-pay | Admitting: Nurse Practitioner

## 2022-11-29 DIAGNOSIS — K219 Gastro-esophageal reflux disease without esophagitis: Secondary | ICD-10-CM

## 2022-12-07 ENCOUNTER — Other Ambulatory Visit: Payer: Self-pay | Admitting: Nurse Practitioner

## 2022-12-07 DIAGNOSIS — I10 Essential (primary) hypertension: Secondary | ICD-10-CM

## 2022-12-07 NOTE — Telephone Encounter (Signed)
L.O.V: 08/30/22  N.O.V: 12/29/22  L.R.F: 07/26/22 Seroquel 90 tab 1 refill    07/28/22 Irbesartan 100 tab 0 refill

## 2022-12-17 ENCOUNTER — Other Ambulatory Visit: Payer: Self-pay | Admitting: Nurse Practitioner

## 2022-12-17 DIAGNOSIS — K219 Gastro-esophageal reflux disease without esophagitis: Secondary | ICD-10-CM

## 2022-12-29 ENCOUNTER — Ambulatory Visit: Payer: Medicare Other | Admitting: Adult Health

## 2022-12-29 ENCOUNTER — Ambulatory Visit (INDEPENDENT_AMBULATORY_CARE_PROVIDER_SITE_OTHER): Payer: Medicare HMO | Admitting: Nurse Practitioner

## 2022-12-29 ENCOUNTER — Encounter: Payer: Self-pay | Admitting: Nurse Practitioner

## 2022-12-29 VITALS — BP 123/60 | HR 79 | Ht 64.0 in | Wt 150.0 lb

## 2022-12-29 DIAGNOSIS — D485 Neoplasm of uncertain behavior of skin: Secondary | ICD-10-CM | POA: Diagnosis not present

## 2022-12-29 DIAGNOSIS — N390 Urinary tract infection, site not specified: Secondary | ICD-10-CM

## 2022-12-29 DIAGNOSIS — E538 Deficiency of other specified B group vitamins: Secondary | ICD-10-CM

## 2022-12-29 DIAGNOSIS — E039 Hypothyroidism, unspecified: Secondary | ICD-10-CM

## 2022-12-29 DIAGNOSIS — R5383 Other fatigue: Secondary | ICD-10-CM

## 2022-12-29 DIAGNOSIS — I10 Essential (primary) hypertension: Secondary | ICD-10-CM

## 2022-12-29 LAB — POCT URINALYSIS DIP (CLINITEK)
Bilirubin, UA: NEGATIVE
Blood, UA: NEGATIVE
Glucose, UA: NEGATIVE mg/dL
Ketones, POC UA: NEGATIVE mg/dL
Nitrite, UA: NEGATIVE
POC PROTEIN,UA: NEGATIVE
Spec Grav, UA: 1.015 (ref 1.010–1.025)
Urobilinogen, UA: 0.2 E.U./dL
pH, UA: 6.5 (ref 5.0–8.0)

## 2022-12-29 NOTE — Progress Notes (Signed)
Established patient visit   Patient: Caroline [name]   DOB: 14-Jul-1936   87 y.o. Female  MRN: [id number] Visit Date: 12/29/2022   Chief Complaint  Patient presents with   Medical Management of Chronic Issues   Subjective    HPI  Follow up  -chronic uti  -history of hypothyroid  --due to have check of thyroid panel  -dementia  --would like to have check of B12 -well managed HTN -two small, skin lesions. One is on right shoulder and one is on center, right side of chest. Starting to change shape. Have irregular borders. Gradually getting larger. -She denies chest pain, chest pressure, or shortness of breath. she denies headaches or visual disturbances. she denies abdominal pain, nausea, vomiting, or changes in bowel or bladder habits.     Medications: Outpatient Medications Prior to Visit  Medication Sig   acetaminophen (TYLENOL) 325 MG tablet Take 2 tablets (650 mg total) by mouth every 6 (six) hours as needed for mild pain, moderate pain, fever or headache.   Ascorbic Acid (VITAMIN C) 1000 MG tablet Take 1,000 mg by mouth daily.   atorvastatin (LIPITOR) 80 MG tablet TAKE 1 TABLET (80 MG TOTAL) BY MOUTH AT BEDTIME.   calcium carbonate ([employer] CALCIUM CARBONATE) 1250 (500 Ca) MG tablet Take 2 tablets (2,500 mg total) by mouth daily.   Catheters MISC Use as directed. 1 box of inserts and 1 box canisters.   cholecalciferol (VITAMIN D3) 25 MCG (1000 UNIT) tablet Take 0.5 tablets (500 Units total) by mouth daily.   furosemide (LASIX) 20 MG tablet Take 1 tablet (20 mg total) by mouth daily as needed for edema. (Patient not taking: Reported on 01/25/2023)   irbesartan (AVAPRO) 75 MG tablet TAKE 1 TABLET (75 MG TOTAL) BY MOUTH DAILY.   levothyroxine (SYNTHROID) 125 MCG tablet TAKE 1 TABLET (125 MCG TOTAL) BY MOUTH DAILY AT 6 AM.   meclizine (ANTIVERT) 25 MG tablet Take 0.5 tablets (12.5 mg total) by mouth 3 (three) times daily as needed for dizziness.   ondansetron (ZOFRAN) 4 MG tablet Take 1  tablet (4 mg total) by mouth every 6 (six) hours. (Patient taking differently: Take 4 mg by mouth every 8 (eight) hours as needed for nausea or vomiting.)   QUEtiapine (SEROQUEL) 25 MG tablet TAKE 1 TABLET (25 MG TOTAL) BY MOUTH AT BEDTIME.   Vibegron (GEMTESA) 75 MG TABS Take 75 mg by mouth every other day. (Patient not taking: Reported on 01/25/2023)   vitamin B-12 (CYANOCOBALAMIN) 250 MCG tablet Take 1 tablet (250 mcg total) by mouth daily.   [DISCONTINUED] donepezil (ARICEPT) 10 MG tablet Take 1 tablet (10 mg total) by mouth daily.   [DISCONTINUED] ELIQUIS 5 MG TABS tablet TAKE 1 TABLET BY MOUTH 2 TIMES DAILY.   [DISCONTINUED] nitrofurantoin (MACRODANTIN) 50 MG capsule Take 1 capsule (50 mg total) by mouth 2 (two) times daily. (Patient not taking: Reported on 01/25/2023)   [DISCONTINUED] pantoprazole (PROTONIX) 40 MG tablet TAKE 1 TABLET BY MOUTH DAILY   [DISCONTINUED] sulfamethoxazole-trimethoprim (BACTRIM) 400-80 MG tablet Take 1 tablet by mouth 2 (two) times daily. (Patient not taking: Reported on 01/25/2023)   [DISCONTINUED] rivastigmine (EXELON) 1.5 MG capsule Take 1 capsule (1.5 mg total) by mouth 2 (two) times daily.   No facility-administered medications prior to visit.    Review of Systems See HPI     Last CBC Lab Results  Component Value Date   WBC 6.9 12/29/2022   HGB 14.6 12/29/2022   HCT 43.3 12/29/2022  MCV 96 12/29/2022   MCH 32.5 12/29/2022   RDW 12.2 12/29/2022   PLT 256 12/29/2022   Last metabolic panel Lab Results  Component Value Date   GLUCOSE 87 12/29/2022   NA 138 12/29/2022   K 5.4 (H) 12/29/2022   CL 100 12/29/2022   CO2 25 12/29/2022   BUN 14 12/29/2022   CREATININE 0.93 12/29/2022   EGFR 60 12/29/2022   CALCIUM 9.6 12/29/2022   PROT 6.9 12/29/2022   ALBUMIN 4.5 12/29/2022   LABGLOB 2.4 12/29/2022   AGRATIO 1.9 12/29/2022   BILITOT 0.3 12/29/2022   ALKPHOS 100 12/29/2022   AST 31 12/29/2022   ALT 26 12/29/2022   ANIONGAP 8 05/30/2022    Last lipids Lab Results  Component Value Date   CHOL 160 08/23/2022   HDL 82 08/23/2022   LDLCALC 61 08/23/2022   TRIG 93 08/23/2022   CHOLHDL 2.0 08/23/2022   Last hemoglobin A1c Lab Results  Component Value Date   HGBA1C 5.6 08/23/2022   Last thyroid functions Lab Results  Component Value Date   TSH 2.950 12/29/2022   T3TOTAL 75 09/09/2021   T4TOTAL 10.3 09/09/2021   Last vitamin B12 and Folate Lab Results  Component Value Date   VITAMINB12 1,218 12/29/2022   FOLATE >20.0 12/29/2022       Objective     Today's Vitals   12/29/22 1042  BP: 123/60  Pulse: 79  SpO2: 97%  Weight: 150 lb (68 kg)  Height: 5\' 4"  (1.626 m)   Body mass index is 25.75 kg/m.  BP Readings from Last 3 Encounters:  01/25/23 (Abnormal) 137/58  12/29/22 123/60  08/30/22 137/65    Wt Readings from Last 3 Encounters:  01/25/23 154 lb 8 oz (70.1 kg)  12/29/22 150 lb (68 kg)  08/30/22 146 lb 1.9 oz (66.3 kg)    Physical Exam Vitals and nursing note reviewed.  Constitutional:      Appearance: Normal appearance. She is well-developed.  HENT:     Head: Normocephalic and atraumatic.     Nose: Nose normal.     Mouth/Throat:     Mouth: Mucous membranes are moist.     Pharynx: Oropharynx is clear.  Eyes:     Extraocular Movements: Extraocular movements intact.     Conjunctiva/sclera: Conjunctivae normal.     Pupils: Pupils are equal, round, and reactive to light.  Neck:     Vascular: No carotid bruit.  Cardiovascular:     Rate and Rhythm: Normal rate and regular rhythm.     Pulses: Normal pulses.     Heart sounds: Normal heart sounds.  Pulmonary:     Effort: Pulmonary effort is normal.     Breath sounds: Normal breath sounds.  Abdominal:     Palpations: Abdomen is soft.  Genitourinary:    Comments: U/a showing small WBC and no other abnormalities.  Musculoskeletal:        General: Normal range of motion.     Cervical back: Normal range of motion and neck supple.   Lymphadenopathy:     Cervical: No cervical adenopathy.  Skin:    General: Skin is warm and dry.     Capillary Refill: Capillary refill takes less than 2 seconds.  Neurological:     General: No focal deficit present.     Mental Status: She is alert and oriented to person, place, and time. Mental status is at baseline.  Psychiatric:        Mood and Affect: Mood normal.  Behavior: Behavior normal.        Thought Content: Thought content normal.        Judgment: Judgment normal.     Results for orders placed or performed in visit on 12/29/22  Urine Culture   Specimen: Urine   Urine  Result Value Ref Range   Urine Culture, Routine Final report    Organism ID, Bacteria Comment   T4, free  Result Value Ref Range   Free T4 1.72 0.82 - 1.77 ng/dL  TSH  Result Value Ref Range   TSH 2.950 0.450 - 4.500 uIU/mL  B12 and Folate Panel  Result Value Ref Range   Vitamin B-12 1,218 232 - 1,245 pg/mL   Folate >20.0 >3.0 ng/mL  Comp Met (CMET)  Result Value Ref Range   Glucose 87 70 - 99 mg/dL   BUN 14 8 - 27 mg/dL   Creatinine, Ser 1.61 0.57 - 1.00 mg/dL   eGFR 60 >09 UE/AVW/0.98   BUN/Creatinine Ratio 15 12 - 28   Sodium 138 134 - 144 mmol/L   Potassium 5.4 (H) 3.5 - 5.2 mmol/L   Chloride 100 96 - 106 mmol/L   CO2 25 20 - 29 mmol/L   Calcium 9.6 8.7 - 10.3 mg/dL   Total Protein 6.9 6.0 - 8.5 g/dL   Albumin 4.5 3.7 - 4.7 g/dL   Globulin, Total 2.4 1.5 - 4.5 g/dL   Albumin/Globulin Ratio 1.9 1.2 - 2.2   Bilirubin Total 0.3 0.0 - 1.2 mg/dL   Alkaline Phosphatase 100 44 - 121 IU/L   AST 31 0 - 40 IU/L   ALT 26 0 - 32 IU/L  CBC  Result Value Ref Range   WBC 6.9 3.4 - 10.8 x10E3/uL   RBC 4.49 3.77 - 5.28 x10E6/uL   Hemoglobin 14.6 11.1 - 15.9 g/dL   Hematocrit 11.9 14.7 - 46.6 %   MCV 96 79 - 97 fL   MCH 32.5 26.6 - 33.0 pg   MCHC 33.7 31.5 - 35.7 g/dL   RDW 82.9 56.2 - 13.0 %   Platelets 256 150 - 450 x10E3/uL  POCT URINALYSIS DIP (CLINITEK)  Result Value Ref Range    Color, UA yellow yellow   Clarity, UA clear clear   Glucose, UA negative negative mg/dL   Bilirubin, UA negative negative   Ketones, POC UA negative negative mg/dL   Spec Grav, UA 8.657 8.469 - 1.025   Blood, UA negative negative   pH, UA 6.5 5.0 - 8.0   POC PROTEIN,UA negative negative, trace   Urobilinogen, UA 0.2 0.2 or 1.0 E.U./dL   Nitrite, UA Negative Negative   Leukocytes, UA Small (1+) (A) Negative    Assessment & Plan    Frequent UTI Assessment & Plan: Urine sample showing small WBC. Send for culture and sensitivity and treat as indicated.   Orders: -     POCT URINALYSIS DIP (CLINITEK) -     Urine Culture; Future  Essential hypertension Assessment & Plan: Controlled. Continue current medication.   Orders: -     CBC; Future -     Comprehensive metabolic panel; Future  Acquired hypothyroidism Assessment & Plan: Check thyroid panel and adjust levothyroxine as indicated   Orders: -     TSH; Future -     T4, free; Future  Vitamin B12 deficiency Assessment & Plan: Check b12 level and treat deficiency as indicated.   Orders: -     B12 and Folate Panel; Future  Other fatigue Assessment &  Plan: Check labs for further evaluation. 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 One on right shoulder, the second on right side of center of chest. Refer to dermatology for further evaluation.   Orders: -     Ambulatory referral to Dermatology     Return in about 4 months (around 04/30/2023) for blood pressure, chronic uti - needs MWV in 8 months.         Carlean Jews, NP  [facility] at [facility] [phone] (phone) [phone] (fax)  [facility]

## 2022-12-30 LAB — COMPREHENSIVE METABOLIC PANEL
ALT: 26 IU/L (ref 0–32)
AST: 31 IU/L (ref 0–40)
Albumin/Globulin Ratio: 1.9 (ref 1.2–2.2)
Albumin: 4.5 g/dL (ref 3.7–4.7)
Alkaline Phosphatase: 100 IU/L (ref 44–121)
BUN/Creatinine Ratio: 15 (ref 12–28)
BUN: 14 mg/dL (ref 8–27)
Bilirubin Total: 0.3 mg/dL (ref 0.0–1.2)
CO2: 25 mmol/L (ref 20–29)
Calcium: 9.6 mg/dL (ref 8.7–10.3)
Chloride: 100 mmol/L (ref 96–106)
Creatinine, Ser: 0.93 mg/dL (ref 0.57–1.00)
Globulin, Total: 2.4 g/dL (ref 1.5–4.5)
Glucose: 87 mg/dL (ref 70–99)
Potassium: 5.4 mmol/L — ABNORMAL HIGH (ref 3.5–5.2)
Sodium: 138 mmol/L (ref 134–144)
Total Protein: 6.9 g/dL (ref 6.0–8.5)
eGFR: 60 mL/min/{1.73_m2} (ref >59)

## 2022-12-30 LAB — B12 AND FOLATE PANEL
Folate: >20 ng/mL (ref >3.0)
Vitamin B-12: 1218 pg/mL (ref 232–1245)

## 2022-12-30 LAB — CBC
Hematocrit: 43.3 % (ref 34.0–46.6)
Hemoglobin: 14.6 g/dL (ref 11.1–15.9)
MCH: 32.5 pg (ref 26.6–33.0)
MCHC: 33.7 g/dL (ref 31.5–35.7)
MCV: 96 fL (ref 79–97)
Platelets: 256 10*3/uL (ref 150–450)
RBC: 4.49 x10E6/uL (ref 3.77–5.28)
RDW: 12.2 % (ref 11.7–15.4)
WBC: 6.9 10*3/uL (ref 3.4–10.8)

## 2022-12-30 LAB — TSH: TSH: 2.95 u[IU]/mL (ref 0.450–4.500)

## 2022-12-30 LAB — T4, FREE: Free T4: 1.72 ng/dL (ref 0.82–1.77)

## 2022-12-30 NOTE — Progress Notes (Signed)
Please let the patient know that, in general,  all of the labs look good. Her potassium level was 5.4. this is something that we can recheck at her next visit. Thyroid, blood count, and b12 levels were all normal. Waiting on urine culture results.  Thanks so much.   -HB

## 2022-12-31 LAB — URINE CULTURE

## 2023-01-05 ENCOUNTER — Ambulatory Visit: Payer: Medicare HMO | Admitting: Adult Health

## 2023-01-07 ENCOUNTER — Telehealth: Payer: Self-pay | Admitting: *Deleted

## 2023-01-07 NOTE — Progress Notes (Signed)
I did not get a message about her urine culture. It is showing contamination with multiple strains of bacteria. We need a new sample to run another culture if she is showing signs of infection. If she is not having any worrisome symptoms, we can hold off until her next visit or for her next visit with urology.

## 2023-01-07 NOTE — Telephone Encounter (Signed)
Called pt he is advised of his wife results and recommendation

## 2023-01-07 NOTE — Telephone Encounter (Signed)
Husband calling to check the results of the urine culture, he said that it was being resent and he had not heard any results yet.

## 2023-01-12 ENCOUNTER — Telehealth: Payer: Self-pay | Admitting: Podiatry

## 2023-01-12 ENCOUNTER — Other Ambulatory Visit: Payer: Medicare HMO

## 2023-01-12 DIAGNOSIS — N390 Urinary tract infection, site not specified: Secondary | ICD-10-CM | POA: Diagnosis not present

## 2023-01-12 NOTE — Telephone Encounter (Signed)
Pts husband called stating they have a little conflict for pts appt on 4.15. He asked if they could come early or late. I explained they could come early but we could not guarantee they would be seen early but if they came over 15 minutes late they would be rescheduled.  He said he has an appt down the street close to the same time. He stated they will get here about 1030.

## 2023-01-17 ENCOUNTER — Encounter: Payer: Self-pay | Admitting: Podiatry

## 2023-01-17 ENCOUNTER — Other Ambulatory Visit: Payer: Self-pay | Admitting: Nurse Practitioner

## 2023-01-17 ENCOUNTER — Ambulatory Visit: Payer: Medicare HMO | Admitting: Podiatry

## 2023-01-17 ENCOUNTER — Telehealth: Payer: Self-pay

## 2023-01-17 DIAGNOSIS — M79674 Pain in right toe(s): Secondary | ICD-10-CM | POA: Diagnosis not present

## 2023-01-17 DIAGNOSIS — M79675 Pain in left toe(s): Secondary | ICD-10-CM | POA: Diagnosis not present

## 2023-01-17 DIAGNOSIS — D689 Coagulation defect, unspecified: Secondary | ICD-10-CM

## 2023-01-17 DIAGNOSIS — B351 Tinea unguium: Secondary | ICD-10-CM

## 2023-01-17 DIAGNOSIS — N390 Urinary tract infection, site not specified: Secondary | ICD-10-CM

## 2023-01-17 LAB — URINE CULTURE

## 2023-01-17 MED ORDER — CIPROFLOXACIN HCL 250 MG PO TABS
250.0000 mg | ORAL_TABLET | Freq: Two times a day (BID) | ORAL | 0 refills | Status: DC
Start: 2023-01-17 — End: 2023-01-25

## 2023-01-17 NOTE — Telephone Encounter (Signed)
Pt husband called and was given the result note and understood that she would need to start new Abx.

## 2023-01-17 NOTE — Telephone Encounter (Signed)
Ok thanks 

## 2023-01-17 NOTE — Progress Notes (Signed)
Please let the patient's husband know that urine sample is positive for enterococcus. He should hold current antibiotics. I have sent new prescription for cipro 250 mg twice daily for next 7 days. She can return to preventive antibiotic after she completes the cipro. We will recheck urine in approximately 2 weeks.  Thanks so much.   -HB

## 2023-01-17 NOTE — Progress Notes (Signed)
This patient returns to my office for at risk foot care.  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  This patient is unable to cut nails herself since the patient cannot reach her nails.These nails are painful walking and wearing shoes.  She presents to the office with her husband.  This patient presents for at risk foot care today.  General Appearance  Alert, conversant and in no acute stress.  Vascular  Dorsalis pedis and posterior tibial  pulses are  weakly palpable  bilaterally.  Capillary return is within normal limits  bilaterally. Temperature is within normal limits  bilaterally.  Neurologic  Senn-Weinstein monofilament wire test within normal limits  bilaterally. Muscle power within normal limits bilaterally.  Nails Thick disfigured discolored nails with subungual debris  hallux  nails bilaterally. No evidence of bacterial infection or drainage bilaterally.  Orthopedic  No limitations of motion  feet .  No crepitus or effusions noted.  No bony pathology or digital deformities noted.  Skin  normotropic skin with no porokeratosis noted bilaterally.  No signs of infections or ulcers noted.     Onychomycosis  Pain in right toes  Pain in left toes  Consent was obtained for treatment procedures.   Mechanical debridement of nails 1-5  bilaterally performed with a nail nipper.  Filed with dremel without incident.    Return office visit    3 months                  Told patient to return for periodic foot care and evaluation due to potential at risk complications.   Helane Gunther DPM

## 2023-01-24 NOTE — Patient Instructions (Signed)
Below is our plan:  We will continue Aricept (donepezil) 10mg  daily. You can switch the time to evenings to see if it helps with morning grogginess. We will add Namenda (memantine). I want you to start 5mg  daily for 1 week.It can be given morning or evening. If well tolerated, increase dose to 5mg  twice daily. Monitor foe any worsening diarrhea or mood changes. Let me know if you have any trouble with this new medication.   Please make sure you are staying well hydrated. I recommend 50-60 ounces daily. Well balanced diet and regular exercise encouraged. Consistent sleep schedule with 6-8 hours recommended.   Please continue follow up with care team as directed.   Follow up with Shanda Bumps in 6 months  You may receive a survey regarding today's visit. I encourage you to leave honest feed back as I do use this information to improve patient care. Thank you for seeing me today!   Management of Memory Problems   There are some general things you can do to help manage your memory problems.  Your memory may not in fact recover, but by using techniques and strategies you will be able to manage your memory difficulties better.   1)  Establish a routine. Try to establish and then stick to a regular routine.  By doing this, you will get used to what to expect and you will reduce the need to rely on your memory.  Also, try to do things at the same time of day, such as taking your medication or checking your calendar first thing in the morning. Think about think that you can do as a part of a regular routine and make a list.  Then enter them into a daily planner to remind you.  This will help you establish a routine.   2)  Organize your environment. Organize your environment so that it is uncluttered.  Decrease visual stimulation.  Place everyday items such as keys or cell phone in the same place every day (ie.  Basket next to front door) Use post it notes with a brief message to yourself (ie. Turn off light,  lock the door) Use labels to indicate where things go (ie. Which cupboards are for food, dishes, etc.) Keep a notepad and pen by the telephone to take messages   3)  Memory Aids A diary or journal/notebook/daily planner Making a list (shopping list, chore list, to do list that needs to be done) Using an alarm as a reminder (kitchen timer or cell phone alarm) Using cell phone to store information (Notes, Calendar, Reminders) Calendar/White board placed in a prominent position Post-it notes   In order for memory aids to be useful, you need to have good habits.  It's no good remembering to make a note in your journal if you don't remember to look in it.  Try setting aside a certain time of day to look in journal.   4)  Improving mood and managing fatigue. There may be other factors that contribute to memory difficulties.  Factors, such as anxiety, depression and tiredness can affect memory. Regular gentle exercise can help improve your mood and give you more energy. Exercise: there are short videos created by the [employer] on Health specially for older adults: https://bit.ly/2I30q97.  Mediterranean diet: which emphasizes fruits, vegetables, whole grains, legumes, fish, and other seafood; unsaturated fats such as olive oils; and low amounts of red meat, eggs, and sweets. A variation of this, called MIND [facility] Intervention for Neurodegenerative Delay) incorporates  the DASH (Dietary Approaches to Stop Hypertension) diet, which has been shown to lower high blood pressure, a risk factor for Alzheimer's disease. More information at: ExitMarketing.de.  Aerobic exercise that improve heart health is also good for the mind.  [employer] on Aging have short videos for exercises that you can do at home: BlindWorkshop.com.pt Simple relaxation techniques may help relieve symptoms of anxiety Try to get  back to completing activities or hobbies you enjoyed doing in the past. Learn to pace yourself through activities to decrease fatigue. Find out about some local support groups where you can share experiences with others. Try and achieve 7-8 hours of sleep at night.   Resources for Family/Caregiver  Online caregiver support groups can be found at WesternTunes.it or call Alzheimer's Association's 24/7 hotline: 626-798-1492. [facility] Memory Counseling Program offers in-person, virtual support groups and individual counseling for both care partners and persons with memory loss. Call for more information at [phone].   Advanced care plan: there are two types of Power of Attorney: healthcare and durable. Healthcare POA is a designated person to make healthcare decisions on your behalf if you were too sick to make them yourself. This person can be selected and documented by your physician. Durable POA has to be set up with a lawyer who takes charge of your finances and estate if you were too sick or cognitively impaired to manage your finances accurately. You can find a local Elder Therapist, art here: [place]Ranch.at.  Check out www.planyourlifespan.org, which will help you plan before a crisis and decide who will take care of life considerations in a circumstance where you may not be able to speak for yourself.   Helpful books (available on [employer] or your local bookstore):  By Dr. Carl Best: Keeping Love Alive as Memories Fade: The 5 Love Languages and the Alzheimer's Journey Jul 05, 2015 The Dementia Care Partner's Workbook: A Guide for Understanding, Education, and [employer] - March 04, 2018.  Both available for less than $15.   "Coping with behavior change in dementia: a family caregiver's guide" by Ricardo Jericho & Valora Piccolo "A Caregiver's Guide to Dementia: Using Activities and Other Strategies to Prevent, Reduce and Manage Behavioral Symptoms" by Mahala Menghini Gitlin and [employer].   Youth worker of Joy for the Person with Alzheimer's or Dementia" 4th edition by Tama High  Caregiver videos on common behaviors related to dementia: PopulationGame.pl  [facility] Caregiver Portal: free to sign up, links to local resources: [email or web address]

## 2023-01-24 NOTE — Progress Notes (Signed)
Chief Complaint  Patient presents with   Follow-up    Pt in room 1, husband in room here for memory follow up. Short term memory is not better per patient husband. Husband reports lots of redirecting. Pt asked if  donepezil can be increased? Pt sleep well at bedtime.    HISTORY OF PRESENT ILLNESS:  01/25/23 ALL:  Caroline [name] is a 87 y.o. 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 She was last seen by Caroline [name] 07/2022. MMSE 20/30. She reported difficulty tolerating donepezil d/t sleepiness and chronic diarrhea. She was switched to Aldarity which was not covered. She was started on rivastigmine. Side effects of insomnia and worsening confusion after first dose reported. It took three days to return to baseline. Aldarity offered but we did not hear back from family. They have continued donepezil 10mg  daily after breakfast. She is also taking quetiapine 25mg  QHS followed by PCP.   She presents with her husband, Caroline [name], who aids in history. She seems to be tolerating donepezil taken with breakfast. Caroline [name] has noticed more sleepiness about 2 hours after taking donepezil. He feels that short term memory may be a little worse but overall she seems fairly stable. Some days are better than others. She needs assistance with most ADLs. She uses her walker but likes to lean forward and walk behind walker. No falls. Diarrhea seems to be a little better. Certain foods and antibiotic therapy seem to make it worse. Imodium PRN helps. She does have difficulty with delusions. She feels that her children are still younger, school aged. They are grown adults. She asks repetitively to go home even though she has lived her her current home for 50 years. No visual or auditory hallucinations. She is finishing treatment for UTI. She is sleeping well. She continues to have vivid dreams but doesn't seem overly bothered by these. No longer using sleep aids.   She  continues Eliquis and atorvastatin for stroke prevention. No known TIA/stroke symptoms since last visit. She is followed closely by PCP.   HISTORY (copied from Caroline [name]'s previous note)  WUJ:WJXBJYN visit 08/05/2021: Caroline [name] is a 87 year old pleasant Caucasian lady seen for initial office consultation visit for stroke.  She is accompanied by her daughter and husband.  History is obtained from them and review of electronic medical records and I personally reviewed pertinent available imaging films in PACS.  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  She presented on 06/19/2021 on postoperative day 1 for confusion, delirium and lethargy.  This was initially felt to be blood pressure and he was taken to the hospital by EMS.  He had an MRI scan of the brain personally reviewed.  Shows old left cerebellar infarct.  Continues to have peripheral vision loss, stroke improved.  Continues to walk with a walker, but no falls or injuries.  She is tolerating well without bruising or bleeding.  Blood pressure is under good control.  Tolerating Lipitor without muscle aches and.  .  She was also felt to have underlying low-grade dementia which was exacerbated by the surgery.  Her symptoms continued for couple of days and noncontrast CT scan of the head was obtained which showed acute left posterior cerebral artery stroke.  Her NIH was 4 on admission she is not a candidate for tPA due to  recent surgery.  MRI scan was subsequently obtained which showed a left posterior cerebral artery infarct and MRA showed left P2 occlusion.  2D echo showed ejection fraction of 60 to 65% without cardiac source of embolism.  LDL cholesterol was elevated at 123 mg percent and hemoglobin A1c was 5.5.  She was initially placed on dual antiplatelet therapy but subsequently  on 07/30/2021 she was found to have a left common femoral vein deep vein thrombosis for which she has subsequently been switched to Xarelto though aspirin has also been continued pending my opinion today.  Patient continues to have right-sided peripheral vision loss.  She has been ambulating with a walker and has been careful.  She has had no falls or injuries.  The family has noticed worsening of her cognitive difficulties with patient having more frequent sundowning for which she has been started on Seroquel 25 mg at night which helps her sleep well.  There are no delusions or hallucinations during the day.  She has more short-term memory difficulties and intermittent confusion which is new.  She does have 24-hour care at home and cannot be left alone.  Update 12/09/2021 : She returns for follow-up after last visit 4 months ago.  She is accompanied by her husband.  Patient continues to have short-term memory difficulties as well as at times difficulty finding her way around her own home.  She gets disoriented and confused off-and-on.  He has had recurrent bouts of UTIs which have been treated by her primary care physician is currently on Macrobid.  Se had an episode of severe dizziness on 12/04/2021 and was taken to the emergency room by EMS blood pressure was found to be significantly elevated.  An MRI scan of the brain was obtained which I personally reviewed did not show any acute abnormality and showed old left PCA infarct.  She continues to ambulate with a walker and history of pulmonary no falls injuries.  She is tolerating Eliquis well without bleeding or bruising..  Lab work on 10/22/2021 showed LDL cholesterol to be near optimal at 73 hemoglobin A1c at 5.7.   Update 02/04/2022 : She returns for follow-up after last visit 2 months ago.  She is accompanied by husband and states that she has good days and bad days.  She was from gets disoriented and confused in time she does not recognize even her husband and  family members.  She is often talking to people who are dead.  I had started the patient on Namenda which she stopped it a month ago as her husband did not notice any benefit.  She is he has also not noticed that she is any worse after stopping the medicine.  Patient is UTI seem to be much better since she has been started on Macrodantin 50 mg which she takes every other day.  She does get occasionally agitated and irritable but there is no violent behavior.  She does not wander off.  She does sleep quite well and eats well.  She has not tried Aricept in the past and is willing to try it.  On Mini-Mental status exam today she scored 16/30.  Clock drawing 3/4.   Update 06/30/2022 JM: Patient returns for follow-up visit after prior visit 4 months ago accompanied by her husband.  Overall stable from stroke standpoint without new stroke/TIA symptoms.  Residual visual impairment, stable since prior visit. Also mentions continued cognitive impairment more so in regards to short-term memory difficulties, at times will not recognize her  husband or believes her husband is her mother, at times have confusions and nonthreatening hallucinations but has also been dealing with recurrent UTIs and has been unsure if these have been contributing.  At times can be agitated or irritable but no violent behavior or any other behavioral concerns.  He believes overall her cognition has been stable without any significant worsening since prior visit.  She has remained on Aricept 10 mg daily, was initially taking at night but was having night terrors and talking in her sleep therefore he switched to daytime but can cause some drowsiness.  She is also been having ongoing issues with diarrhea.  Compliant on Eliquis and atorvastatin, denies side effects.  Blood pressure typically well controlled.  Closely follows with PCP.  No further concerns at this time.   REVIEW OF SYSTEMS: Out of a complete 14 system review of symptoms, the patient  complains only of the following symptoms, memory loss, delusions, and all other reviewed systems are negative.   ALLERGIES: Allergies  Allergen Reactions   Gluten Meal Nausea And Vomiting    Patient has CELIAC DISEASE   Wheat Nausea And Vomiting    Patient has CELIAC DISEASE   Adhesive [Tape] Other (See Comments)    Patient's skin is VERY THIN and tears very easily; please use either paper tape or Coban wrap   Codeine Nausea And Vomiting   Other Other (See Comments)    Patient has Hypoglycemia; her husband stated "when her sugar crashes, it crashes hard."   Penicillins Other (See Comments)    Doesn't remember reaction , but denies any SOB/Swelling States " it was a long time ago "      HOME MEDICATIONS: Outpatient Medications Prior to Visit  Medication Sig Dispense Refill   acetaminophen (TYLENOL) 325 MG tablet Take 2 tablets (650 mg total) by mouth every 6 (six) hours as needed for mild pain, moderate pain, fever or headache.     Ascorbic Acid (VITAMIN C) 1000 MG tablet Take 1,000 mg by mouth daily.     atorvastatin (LIPITOR) 80 MG tablet TAKE 1 TABLET (80 MG TOTAL) BY MOUTH AT BEDTIME. 90 tablet 2   calcium carbonate ([employer] CALCIUM CARBONATE) 1250 (500 Ca) MG tablet Take 2 tablets (2,500 mg total) by mouth daily. 30 tablet 0   Catheters MISC Use as directed. 1 box of inserts and 1 box canisters. 1 each 3   cholecalciferol (VITAMIN D3) 25 MCG (1000 UNIT) tablet Take 0.5 tablets (500 Units total) by mouth daily.     ELIQUIS 5 MG TABS tablet TAKE 1 TABLET BY MOUTH 2 TIMES DAILY. 180 tablet 0   irbesartan (AVAPRO) 75 MG tablet TAKE 1 TABLET (75 MG TOTAL) BY MOUTH DAILY. 100 tablet 0   levothyroxine (SYNTHROID) 125 MCG tablet TAKE 1 TABLET (125 MCG TOTAL) BY MOUTH DAILY AT 6 AM. 90 tablet 0   meclizine (ANTIVERT) 25 MG tablet Take 0.5 tablets (12.5 mg total) by mouth 3 (three) times daily as needed for dizziness. 30 tablet 0   ondansetron (ZOFRAN) 4 MG tablet Take 1 tablet (4 mg total)  by mouth every 6 (six) hours. (Patient taking differently: Take 4 mg by mouth every 8 (eight) hours as needed for nausea or vomiting.) 12 tablet 0   pantoprazole (PROTONIX) 40 MG tablet TAKE 1 TABLET BY MOUTH DAILY 30 tablet 0   QUEtiapine (SEROQUEL) 25 MG tablet TAKE 1 TABLET (25 MG TOTAL) BY MOUTH AT BEDTIME. 90 tablet 0   vitamin B-12 (CYANOCOBALAMIN) 250  MCG tablet Take 1 tablet (250 mcg total) by mouth daily. 30 tablet 0   donepezil (ARICEPT) 10 MG tablet Take 1 tablet (10 mg total) by mouth daily. 30 tablet 6   furosemide (LASIX) 20 MG tablet Take 1 tablet (20 mg total) by mouth daily as needed for edema. (Patient not taking: Reported on 01/25/2023) 90 tablet 0   Vibegron (GEMTESA) 75 MG TABS Take 75 mg by mouth every other day. (Patient not taking: Reported on 01/25/2023) 28 tablet 0   ciprofloxacin (CIPRO) 250 MG tablet Take 1 tablet (250 mg total) by mouth 2 (two) times daily. (Patient not taking: Reported on 01/25/2023) 14 tablet 0   nitrofurantoin (MACRODANTIN) 50 MG capsule Take 1 capsule (50 mg total) by mouth 2 (two) times daily. (Patient not taking: Reported on 01/25/2023) 20 capsule 3   sulfamethoxazole-trimethoprim (BACTRIM) 400-80 MG tablet Take 1 tablet by mouth 2 (two) times daily. (Patient not taking: Reported on 01/25/2023) 14 tablet 0   No facility-administered medications prior to visit.     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   Bridge Left Nose, and Left Neck   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 Cheek Tx: curret x 3 and 5FU   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   Bridge of nose tx Cx3 and 5FU, Left neck Tx Cx3 and 5FU   Thrombophlebitis      PAST SURGICAL HISTORY: Past Surgical History:  Procedure Laterality Date   ABDOMINAL HYSTERECTOMY  1979   CATARACT EXTRACTION, BILATERAL     knee surgery Left    VARICOSE VEIN SURGERY Left    XI ROBOTIC ASSISTED PARAESOPHAGEAL HERNIA REPAIR N/A 06/15/2021   Procedure: XI ROBOTIC ASSISTED PARAESOPHAGEAL HERNIA REPAIR, GASTROPEXY, UPPER ENDOSCOPY;  Surgeon: Berna Bue, MD;  Location: [facility];  Service: General;  Laterality: N/A;     FAMILY HISTORY: Family History  Problem Relation Age of Onset   Arthritis Mother        pt denies   Hypertension Mother        pt denies   Obesity Sister    Cancer Maternal Uncle        type unknown   Colon cancer Neg Hx    Stomach cancer Neg Hx    Esophageal cancer Neg Hx    Pancreatic cancer Neg Hx      SOCIAL HISTORY: Social History   Socioeconomic History   Marital status: Married    Spouse name: [name]   Number of children: 3   Years of education: 12   Highest education level: Not on file  Occupational History   Occupation: retired  Tobacco Use   Smoking status: Never   Smokeless tobacco: Never  Vaping Use   Vaping Use: Never used  Substance and Sexual Activity   Alcohol use: No   Drug use: Not Currently   Sexual activity: Never  Other Topics Concern   Not on file  Social History Narrative   Lives with husband   Right Handed   Drinks no caffeine   Social Determinants of Health   Financial Resource Strain: Low Risk  (08/30/2022)   Overall Financial Resource Strain (CARDIA)    Difficulty of Paying Living Expenses: Not hard at all  Food Insecurity: No Food Insecurity (08/30/2022)   Hunger Vital Sign    Worried About Running Out of  Food in the Last Year: Never true    Ran Out of Food in the Last Year: Never true  Transportation Needs: No Transportation Needs (08/30/2022)   PRAPARE - Administrator, Civil Service (Medical): No    Lack of Transportation (Non-Medical): No  Physical Activity: Insufficiently Active (08/30/2022)   Exercise Vital Sign    Days of Exercise per Week: 2 days    Minutes of Exercise per Session: 20 min  Stress: Stress Concern Present (08/30/2022)   [employer] of Occupational Health - Occupational Stress Questionnaire    Feeling of Stress : To some extent  Social Connections: Moderately Isolated (08/30/2022)   Social Connection and Isolation Panel [NHANES]    Frequency of Communication with Friends and Family: More than three times a week    Frequency of Social Gatherings with Friends and Family: Twice a week    Attends Religious Services: Never    Database administrator or Organizations: No    Attends Banker Meetings: Never    Marital Status: Married  Catering manager Violence: Not At Risk (08/30/2022)   Humiliation, Afraid, Rape, and Kick questionnaire    Fear of Current or Ex-Partner: No    Emotionally Abused: No    Physically Abused: No    Sexually Abused: No     PHYSICAL EXAM  Vitals:   01/25/23 0904  BP: (!) 137/58  Pulse: 85  Weight: 154 lb 8 oz (70.1 kg)  Height: 5\' 4"  (1.626 m)   Body mass index is 26.52 kg/m.  Generalized: Well developed, in no acute distress  Cardiology: normal rate and rhythm, no murmur auscultated  Respiratory: clear to auscultation bilaterally    Neurological examination  Mentation: Alert oriented to time, place, history taking. Follows all commands speech and language fluent Cranial nerve II-XII: Pupils were equal round reactive to light. Extraocular movements were full, visual field were full on confrontational test. Facial sensation and strength were normal. Uvula tongue midline. Head turning and shoulder shrug   were normal and symmetric. Motor: The motor testing reveals 5 over 5 strength of all 4 extremities. Good symmetric motor tone is noted throughout.  Sensory: Sensory testing is intact to soft touch on all 4 extremities. No evidence of extinction is noted.  Coordination: Cerebellar testing reveals good finger-nose-finger and heel-to-shin bilaterally.  Gait and station: Gait is normal. Tandem gait is normal. Romberg is negative. No drift is seen.  Reflexes: Deep tendon reflexes are symmetric and normal bilaterally.    DIAGNOSTIC DATA (LABS, IMAGING, TESTING) - I reviewed patient records, labs, notes, testing and imaging myself where available.  Lab Results  Component Value Date   WBC 6.9 12/29/2022   HGB 14.6 12/29/2022   HCT 43.3 12/29/2022   MCV 96 12/29/2022   PLT 256 12/29/2022      Component Value Date/Time   NA 138 12/29/2022 1130   K  5.4 (H) 12/29/2022 1130   CL 100 12/29/2022 1130   CO2 25 12/29/2022 1130   GLUCOSE 87 12/29/2022 1130   GLUCOSE 91 05/30/2022 0227   BUN 14 12/29/2022 1130   CREATININE 0.93 12/29/2022 1130   CALCIUM 9.6 12/29/2022 1130   PROT 6.9 12/29/2022 1130   ALBUMIN 4.5 12/29/2022 1130   AST 31 12/29/2022 1130   ALT 26 12/29/2022 1130   ALKPHOS 100 12/29/2022 1130   BILITOT 0.3 12/29/2022 1130   GFRNONAA >60 05/30/2022 0227   GFRAA >60 02/10/2018 0438   Lab Results  Component Value Date   CHOL 160 08/23/2022   HDL 82 08/23/2022   LDLCALC 61 08/23/2022   TRIG 93 08/23/2022   CHOLHDL 2.0 08/23/2022   Lab Results  Component Value Date   HGBA1C 5.6 08/23/2022   Lab Results  Component Value Date   VITAMINB12 1,218 12/29/2022   Lab Results  Component Value Date   TSH 2.950 12/29/2022       01/25/2023    9:15 AM 06/30/2022    2:13 PM 02/04/2022   10:55 AM  MMSE - Mini Mental State Exam  Orientation to time 0 1 0  Orientation to Place 4 5 4   Registration 3 3 3   Attention/ Calculation 1 5 1   Recall 1 0 0  Language- name 2 objects 2 2  2   Language- repeat 1 1 1   Language- follow 3 step command 3 1 3   Language- read & follow direction 1 1 1   Write a sentence 1 1 1   Copy design 1 0 0  Total score 18 20 16         12/09/2021    3:32 PM  Montreal Cognitive Assessment   Visuospatial/ Executive (0/5) 0  Naming (0/3) 2  Attention: Read list of digits (0/2) 2  Attention: Read list of letters (0/1) 0  Attention: Serial 7 subtraction starting at 100 (0/3) 2  Language: Repeat phrase (0/2) 0  Language : Fluency (0/1) 0  Abstraction (0/2) 2  Delayed Recall (0/5) 0  Orientation (0/6) 3  Total 11  Adjusted Score (based on education) 11     ASSESSMENT AND PLAN  87 y.o. year old female  has a past medical history of Anxiety, Arthritis, Celiac sprue, CVA (cerebral vascular accident) (02/2018), GERD (gastroesophageal reflux disease), Glaucoma, Hormone replacement therapy (HRT), Hyperlipidemia, Hypertension, Hypoglycemia, Hypothyroidism, OAB (overactive bladder), Osteoporosis, PONV (postoperative nausea and vomiting), Raynaud's disease, Scoliosis, Seasonal allergies, Senile purpura, Squamous cell carcinoma in situ (SCCIS) (01/30/2008), Squamous cell carcinoma in situ (SCCIS) (08/02/2018), Squamous cell carcinoma in situ (SCCIS) (01/30/2008), Squamous cell carcinoma in situ (SCCIS) (08/02/2018), and Thrombophlebitis. here with    Cerebrovascular accident (CVA) due to occlusion of left posterior cerebral artery  Mild vascular dementia without behavioral disturbance, psychotic disturbance, mood disturbance, or anxiety  Cognitive deficit, post-stroke  Roise Lofink is doing fairly well. We will continue donepezil 10mg  daily. Caroline Bleecker is aware he may give at bedtime if well tolerated. We will add memantine 5mg  daily for 1 week then increase to 5mg  twice daily. May increase in the future if doing well. Potential side effects reviewed. Memory compensation strategies advised. Healthy lifestyle habits encouraged. She will follow up with PCP  as directed. She will return to see Caroline [name] in 6 months, sooner if needed. She and Caroline Bannan verbalize understanding and agreement with this plan.   No orders of the defined types were placed in this encounter.    Meds ordered this encounter  Medications   donepezil (ARICEPT) 10 MG tablet    Sig: Take 1 tablet (10 mg total) by mouth daily.    Dispense:  90 tablet    Refill:  1    Order Specific Question:   Supervising Provider    Answer:   Anson Fret [[id number]]   memantine (NAMENDA) 5 MG tablet    Sig: Take 1 tablet (5 mg total) by mouth 2 (two) times daily.    Dispense:  180 tablet    Refill:  1    Order Specific Question:   Supervising Provider    Answer:   Anson Fret [id number]    I spent 30 minutes of face-to-face and non-face-to-face time with patient.  This included previsit chart review, lab review, study review, order entry, electronic health record documentation, patient education.   Shawnie Dapper, MSN, FNP-C 01/25/2023, 10:09 AM  [facility] [address] [place], [place] [address] [phone]

## 2023-01-25 ENCOUNTER — Encounter: Payer: Self-pay | Admitting: Family Medicine

## 2023-01-25 ENCOUNTER — Ambulatory Visit: Payer: Medicare HMO | Admitting: Family Medicine

## 2023-01-25 ENCOUNTER — Other Ambulatory Visit: Payer: Self-pay | Admitting: Nurse Practitioner

## 2023-01-25 VITALS — BP 137/58 | HR 85 | Ht 64.0 in | Wt 154.5 lb

## 2023-01-25 DIAGNOSIS — K219 Gastro-esophageal reflux disease without esophagitis: Secondary | ICD-10-CM

## 2023-01-25 DIAGNOSIS — I69319 Unspecified symptoms and signs involving cognitive functions following cerebral infarction: Secondary | ICD-10-CM | POA: Diagnosis not present

## 2023-01-25 DIAGNOSIS — F01A0 Vascular dementia, mild, without behavioral disturbance, psychotic disturbance, mood disturbance, and anxiety: Secondary | ICD-10-CM

## 2023-01-25 DIAGNOSIS — I63532 Cerebral infarction due to unspecified occlusion or stenosis of left posterior cerebral artery: Secondary | ICD-10-CM | POA: Diagnosis not present

## 2023-01-25 MED ORDER — MEMANTINE HCL 5 MG PO TABS: 5.0000 mg | ORAL_TABLET | Freq: Two times a day (BID) | ORAL | 1 refills | Status: DC

## 2023-01-25 MED ORDER — DONEPEZIL HCL 10 MG PO TABS: 10.0000 mg | ORAL_TABLET | Freq: Every day | ORAL | 1 refills | Status: DC

## 2023-01-27 ENCOUNTER — Other Ambulatory Visit: Payer: Self-pay | Admitting: Nurse Practitioner

## 2023-01-27 DIAGNOSIS — I82412 Acute embolism and thrombosis of left femoral vein: Secondary | ICD-10-CM

## 2023-01-31 ENCOUNTER — Other Ambulatory Visit: Payer: Medicare HMO

## 2023-01-31 DIAGNOSIS — N390 Urinary tract infection, site not specified: Secondary | ICD-10-CM

## 2023-02-02 LAB — URINE CULTURE

## 2023-02-02 NOTE — Progress Notes (Signed)
Please let the patient know that urine shows only normal bacteria. He should be back on the [place]. We can recheck urine at next visit.  Thanks  -HB

## 2023-02-03 DIAGNOSIS — R5383 Other fatigue: Secondary | ICD-10-CM | POA: Insufficient documentation

## 2023-02-03 DIAGNOSIS — D485 Neoplasm of uncertain behavior of skin: Secondary | ICD-10-CM | POA: Insufficient documentation

## 2023-02-03 NOTE — Assessment & Plan Note (Signed)
Controlled.   -Continue current medication

## 2023-02-03 NOTE — Assessment & Plan Note (Signed)
Check labs for further evaluation. Include B12 and folate.include thyroid panel

## 2023-02-03 NOTE — Assessment & Plan Note (Signed)
Urine sample showing small WBC. Send for culture and sensitivity and treat as indicated.

## 2023-02-03 NOTE — Assessment & Plan Note (Signed)
Two small, irregularly shaped lesions. One on right shoulder, the second on right side of center of chest. Refer to dermatology for further evaluation.

## 2023-02-03 NOTE — Assessment & Plan Note (Signed)
Check b12 level and treat deficiency as indicated.

## 2023-02-03 NOTE — Assessment & Plan Note (Signed)
Check thyroid panel and adjust levothyroxine as indicated  

## 2023-02-08 ENCOUNTER — Other Ambulatory Visit: Payer: Self-pay | Admitting: Nurse Practitioner

## 2023-02-08 DIAGNOSIS — E039 Hypothyroidism, unspecified: Secondary | ICD-10-CM

## 2023-02-16 ENCOUNTER — Telehealth: Payer: Self-pay | Admitting: Family Medicine

## 2023-02-16 NOTE — Telephone Encounter (Signed)
I called to discuss the below and spoke with patient husband Caroline [name], he states he has not seen any improvements since starting memantine 5 mg ( was taking daily but now on 1 po bid.) They are now in the 3rd week, he states you told him that diarrhea would be a side effect. Husband states the diarrhea has increased. He did say the diarrhea is not an emergency, but has increased. I explained to Caroline [name] that he may need to give the mediation a little more time to see some results.   Pt husband would like to know about how long you think until they see results?   Please advise

## 2023-02-16 NOTE — Telephone Encounter (Signed)
Mr. Pilgreen informed with all the below.

## 2023-02-16 NOTE — Telephone Encounter (Signed)
Spouse reports that [id number] (NAMENDA) 5 MG tablet pt has not experienced any positive results, only negative results such as diarrhea.  Spouse is asking if pt should stop the medication, please call.

## 2023-02-23 ENCOUNTER — Ambulatory Visit: Payer: Medicare HMO | Admitting: Family Medicine

## 2023-02-23 ENCOUNTER — Other Ambulatory Visit: Payer: Self-pay | Admitting: Nurse Practitioner

## 2023-02-23 DIAGNOSIS — I10 Essential (primary) hypertension: Secondary | ICD-10-CM

## 2023-03-02 DIAGNOSIS — N390 Urinary tract infection, site not specified: Secondary | ICD-10-CM | POA: Diagnosis not present

## 2023-03-02 DIAGNOSIS — R350 Frequency of micturition: Secondary | ICD-10-CM | POA: Diagnosis not present

## 2023-03-08 DIAGNOSIS — G894 Chronic pain syndrome: Secondary | ICD-10-CM | POA: Diagnosis not present

## 2023-03-08 DIAGNOSIS — M47816 Spondylosis without myelopathy or radiculopathy, lumbar region: Secondary | ICD-10-CM | POA: Diagnosis not present

## 2023-03-08 DIAGNOSIS — M545 Low back pain, unspecified: Secondary | ICD-10-CM | POA: Diagnosis not present

## 2023-03-09 ENCOUNTER — Encounter: Payer: Self-pay | Admitting: Dermatology

## 2023-03-09 ENCOUNTER — Ambulatory Visit (INDEPENDENT_AMBULATORY_CARE_PROVIDER_SITE_OTHER): Payer: Medicare HMO | Admitting: Dermatology

## 2023-03-09 VITALS — BP 100/60

## 2023-03-09 DIAGNOSIS — L821 Other seborrheic keratosis: Secondary | ICD-10-CM | POA: Diagnosis not present

## 2023-03-09 DIAGNOSIS — L570 Actinic keratosis: Secondary | ICD-10-CM

## 2023-03-09 DIAGNOSIS — L814 Other melanin hyperpigmentation: Secondary | ICD-10-CM

## 2023-03-09 DIAGNOSIS — W908XXA Exposure to other nonionizing radiation, initial encounter: Secondary | ICD-10-CM

## 2023-03-09 NOTE — Patient Instructions (Addendum)
Cryotherapy Aftercare  Wash gently with soap and water everyday.   Apply Vaseline and Band-Aid daily until healed.   Due to recent changes in healthcare laws, you may see results of your pathology and/or laboratory studies on MyChart before the doctors have had a chance to review them. We understand that in some cases there may be results that are confusing or concerning to you. Please understand that not all results are received at the same time and often the doctors may need to interpret multiple results in order to provide you with the best plan of care or course of treatment. Therefore, we ask that you please give us 2 business days to thoroughly review all your results before contacting the office for clarification. Should we see a critical lab result, you will be contacted sooner.   If You Need Anything After Your Visit  If you have any questions or concerns for your doctor, please call our main line at [phone] If no one answers, please leave a voicemail as directed and we will return your call as soon as possible. Messages left after 4 pm will be answered the following business day.   You may also send us a message via MyChart. We typically respond to MyChart messages within 1-2 business days.  For prescription refills, please ask your pharmacy to contact our office. Our fax number is [phone].  If you have an urgent issue when the clinic is closed that cannot wait until the next business day, you can page your doctor at the number below.    Please note that while we do our best to be available for urgent issues outside of office hours, we are not available 24/7.   If you have an urgent issue and are unable to reach us, you may choose to seek medical care at your doctor's office, retail clinic, urgent care center, or emergency room.  If you have a medical emergency, please immediately call 911 or go to the emergency department. In the event of inclement weather, please call our  main line at [phone] for an update on the status of any delays or closures.  Dermatology Medication Tips: Please keep the boxes that topical medications come in in order to help keep track of the instructions about where and how to use these. Pharmacies typically print the medication instructions only on the boxes and not directly on the medication tubes.   If your medication is too expensive, please contact our office at [phone] or send us a message through MyChart.   We are unable to tell what your co-pay for medications will be in advance as this is different depending on your insurance coverage. However, we may be able to find a substitute medication at lower cost or fill out paperwork to get insurance to cover a needed medication.   If a prior authorization is required to get your medication covered by your insurance company, please allow us 1-2 business days to complete this process.  Drug prices often vary depending on where the prescription is filled and some pharmacies may offer cheaper prices.  The website www.goodrx.com contains coupons for medications through different pharmacies. The prices here do not account for what the cost may be with help from insurance (it may be cheaper with your insurance), but the website can give you the price if you did not use any insurance.  - You can print the associated coupon and take it with your prescription to the pharmacy.  - You may also   stop by our office during regular business hours and pick up a GoodRx coupon card.  - If you need your prescription sent electronically to a different pharmacy, notify our office through [facility] MyChart or by phone at [phone]     

## 2023-03-09 NOTE — Progress Notes (Signed)
   New Patient Visit   Subjective  Caroline [name] is a 87 y.o. female who presents for the following: Brownish growths on the right shoulder, right chest,  mid forehead x 1 year. Sometimes they break off. She says they are not itchy or painful. No personal or family history of skin cancer. She is here for a spot check.   The following portions of the chart were reviewed this encounter and updated as appropriate: medications, allergies, medical history  Review of Systems:  No other skin or systemic complaints except as noted in HPI or Assessment and Plan.  Objective  Well appearing patient in no apparent distress; mood and affect are within normal limits.   A focused examination was performed of the following areas: Right shoulder, right chest, face, arms  Relevant exam findings are noted in the Assessment and Plan.  Right Breast, left glabella, right forehead (3) Erythematous thin papules/macules with gritty scale.     Assessment & Plan   SEBORRHEIC KERATOSIS at the right shoulder, back, and chest - Stuck-on, waxy, tan-brown papules and/or plaques  - Benign-appearing - Discussed benign etiology and prognosis. - Observe - Call for any changes   LENTIGINES Exam: scattered tan macules Due to sun exposure Treatment Plan: Benign-appearing, observe. Recommend daily broad spectrum sunscreen SPF 30+ to sun-exposed areas, reapply every 2 hours as needed.  Call for any changes      Actinic keratosis (3) Right Breast, left glabella, right forehead  Destruction of lesion - Right Breast, left glabella, right forehead Complexity: simple   Destruction method: cryotherapy   Informed consent: discussed and consent obtained   Timeout:  patient name, date of birth, surgical site, and procedure verified Lesion destroyed using liquid nitrogen: Yes   Region frozen until ice ball extended beyond lesion: Yes   Outcome: patient tolerated procedure well with no complications   Post-procedure  details: wound care instructions given      Return in about 6 months (around 09/08/2023) for Upper body.  Jaclynn Guarneri, CMA, am acting as scribe for [employer], DO.   Documentation: I have reviewed the above documentation for accuracy and completeness, and I agree with the above.  Langston Reusing, DO

## 2023-03-22 ENCOUNTER — Other Ambulatory Visit: Payer: Self-pay | Admitting: Nurse Practitioner

## 2023-03-24 ENCOUNTER — Ambulatory Visit (INDEPENDENT_AMBULATORY_CARE_PROVIDER_SITE_OTHER): Payer: Medicare HMO | Admitting: Nurse Practitioner

## 2023-03-24 VITALS — BP 110/70 | HR 68 | Ht 64.0 in | Wt 154.0 lb

## 2023-03-24 DIAGNOSIS — N390 Urinary tract infection, site not specified: Secondary | ICD-10-CM

## 2023-03-24 LAB — POCT URINALYSIS DIP (CLINITEK)
Bilirubin, UA: NEGATIVE
Blood, UA: NEGATIVE
Glucose, UA: NEGATIVE mg/dL
Ketones, POC UA: NEGATIVE mg/dL
Leukocytes, UA: NEGATIVE
Nitrite, UA: NEGATIVE
POC PROTEIN,UA: NEGATIVE
Spec Grav, UA: 1.02 (ref 1.010–1.025)
Urobilinogen, UA: 0.2 E.U./dL
pH, UA: 7 (ref 5.0–8.0)

## 2023-03-24 NOTE — Progress Notes (Signed)
Patient is here for UTI

## 2023-04-18 ENCOUNTER — Other Ambulatory Visit: Payer: Self-pay | Admitting: Nurse Practitioner

## 2023-04-18 DIAGNOSIS — K219 Gastro-esophageal reflux disease without esophagitis: Secondary | ICD-10-CM

## 2023-04-21 ENCOUNTER — Encounter: Payer: Self-pay | Admitting: Podiatry

## 2023-04-21 ENCOUNTER — Ambulatory Visit: Payer: Medicare HMO | Admitting: Podiatry

## 2023-04-21 DIAGNOSIS — B351 Tinea unguium: Secondary | ICD-10-CM | POA: Diagnosis not present

## 2023-04-21 DIAGNOSIS — D689 Coagulation defect, unspecified: Secondary | ICD-10-CM

## 2023-04-21 DIAGNOSIS — M79674 Pain in right toe(s): Secondary | ICD-10-CM | POA: Diagnosis not present

## 2023-04-21 DIAGNOSIS — M79675 Pain in left toe(s): Secondary | ICD-10-CM

## 2023-04-21 NOTE — Progress Notes (Signed)
This patient returns to my office for at risk foot care.  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  This patient is unable to cut nails herself since the patient cannot reach her nails.These nails are painful walking and wearing shoes.  She presents to the office with her husband.  This patient presents for at risk foot care today.  General Appearance  Alert, conversant and in no acute stress.  Vascular  Dorsalis pedis and posterior tibial  pulses are  weakly palpable  bilaterally.  Capillary return is within normal limits  bilaterally. Temperature is within normal limits  bilaterally.  Neurologic  Senn-Weinstein monofilament wire test within normal limits  bilaterally. Muscle power within normal limits bilaterally.  Nails Thick disfigured discolored nails with subungual debris  hallux  nails bilaterally. No evidence of bacterial infection or drainage bilaterally.  Orthopedic  No limitations of motion  feet .  No crepitus or effusions noted.  No bony pathology or digital deformities noted.  Skin  normotropic skin with no porokeratosis noted bilaterally.  No signs of infections or ulcers noted.     Onychomycosis  Pain in right toes  Pain in left toes  Consent was obtained for treatment procedures.   Mechanical debridement of nails 1-5  bilaterally performed with a nail nipper.  Filed with dremel without incident.    Return office visit    4 months                  Told patient to return for periodic foot care and evaluation due to potential at risk complications.   Helane Gunther DPM

## 2023-04-26 ENCOUNTER — Ambulatory Visit (INDEPENDENT_AMBULATORY_CARE_PROVIDER_SITE_OTHER): Payer: Medicare HMO | Admitting: Dermatology

## 2023-04-26 ENCOUNTER — Encounter: Payer: Self-pay | Admitting: Dermatology

## 2023-04-26 VITALS — BP 102/64

## 2023-04-26 DIAGNOSIS — L570 Actinic keratosis: Secondary | ICD-10-CM

## 2023-04-26 DIAGNOSIS — L304 Erythema intertrigo: Secondary | ICD-10-CM

## 2023-04-26 MED ORDER — NYSTATIN-TRIAMCINOLONE 100000-0.1 UNIT/GM-% EX OINT: 1.0000 | TOPICAL_OINTMENT | Freq: Two times a day (BID) | CUTANEOUS | 2 refills | Status: DC

## 2023-04-26 NOTE — Patient Instructions (Addendum)
Hello Miss Caroline [name],  Thank you for visiting my office today. I appreciate your commitment to addressing your health concerns and am glad we could discuss the management of your rash.  Here are the key instructions and recommendations from our consultation:  Diagnosis --> Intrigo - Medication: Apply Nystatin Triamcinolone ointment to the affected area under the breast. Use a thin layer morning and night for a maximum of 10 days. This will help clear the yeast overgrowth and reduce irritation.  - Daily Care: Continue using Zeasorb powder daily to help prevent recurrence of the rash.  - Clothing Recommendations: For bras, consider visiting stores like [employer], [employer], or [employer] for a fitting and recommendations on supportive, breathable options. KNIX brand bras are also recommended for their support and breathability (Photo is below), though they are not pure cotton. Alternatively, consider camisoles with built-in padding for support without tightness.  - Follow-Up: Please monitor the rash and if there is no improvement within four days or if symptoms worsen, contact our office.  Thank you once again for your visit, and please do not hesitate to reach out if you have any further questions or concerns.        Cryotherapy Aftercare  Wash gently with soap and water everyday.   Apply Vaseline and Band-Aid daily until healed.   Due to recent changes in healthcare laws, you may see results of your pathology and/or laboratory studies on MyChart before the doctors have had a chance to review them. We understand that in some cases there may be results that are confusing or concerning to you. Please understand that not all results are received at the same time and often the doctors may need to interpret multiple results in order to provide you with the best plan of care or course of treatment. Therefore, we ask that you please give [place] 2 business days to thoroughly review all your results before  contacting the office for clarification. Should we see a critical lab result, you will be contacted sooner.   If You Need Anything After Your Visit  If you have any questions or concerns for your doctor, please call our main line at [phone] If no one answers, please leave a voicemail as directed and we will return your call as soon as possible. Messages left after 4 pm will be answered the following business day.   You may also send [place] a message via MyChart. We typically respond to MyChart messages within 1-2 business days.  For prescription refills, please ask your pharmacy to contact our office. Our fax number is [phone].  If you have an urgent issue when the clinic is closed that cannot wait until the next business day, you can page your doctor at the number below.    Please note that while we do our best to be available for urgent issues outside of office hours, we are not available 24/7.   If you have an urgent issue and are unable to reach [place], you may choose to seek medical care at your doctor's office, retail clinic, urgent care center, or emergency room.  If you have a medical emergency, please immediately call 911 or go to the emergency department. In the event of inclement weather, please call our main line at [phone] for an update on the status of any delays or closures.  Dermatology Medication Tips: Please keep the boxes that topical medications come in in order to help keep track of the instructions about where and how to use these.  Pharmacies typically print the medication instructions only on the boxes and not directly on the medication tubes.   If your medication is too expensive, please contact our office at [phone] or send [place] a message through MyChart.   We are unable to tell what your co-pay for medications will be in advance as this is different depending on your insurance coverage. However, we may be able to find a substitute medication at lower cost  or fill out paperwork to get insurance to cover a needed medication.   If a prior authorization is required to get your medication covered by your insurance company, please allow [place] 1-2 business days to complete this process.  Drug prices often vary depending on where the prescription is filled and some pharmacies may offer cheaper prices.  The website www.goodrx.com contains coupons for medications through different pharmacies. The prices here do not account for what the cost may be with help from insurance (it may be cheaper with your insurance), but the website can give you the price if you did not use any insurance.  - You can print the associated coupon and take it with your prescription to the pharmacy.  - You may also stop by our office during regular business hours and pick up a GoodRx coupon card.  - If you need your prescription sent electronically to a different pharmacy, notify our office through [facility] or by phone at [phone]

## 2023-04-26 NOTE — Progress Notes (Signed)
   Follow-Up Visit   Subjective  Caroline [name] is a 87 y.o. female who presents for the following:  Follow up on AK's at the right breast, left glabella, and right forehead which seem resolved.  There is a new rash under the breasts x 6 weeks. Her husband has tried Desitin, Museum/gallery curator, and Monistat cream. It is red and irritated. Her husband wants a recommendation for bras. The following portions of the chart were reviewed this encounter and updated as appropriate:      Review of Systems: No other skin or systemic complaints.  Objective  Well appearing patient in no apparent distress; mood and affect are within normal limits.   INTERTRIGO Exam Erythematous macerated patches. Under the left breast is a pink atrophic plaque with few pustules  Flared  Intertrigo is a chronic recurrent rash that occurs in skin fold areas that may be associated with friction; heat; moisture; yeast; fungus; and bacteria.  It is exacerbated by increased movement / activity; sweating; and higher atmospheric temperature.  Treatment Plan Discussed KNIX bras, non underwire bras or camisoles with built in bras. Nystatin Triamcinolone ointment 2 x daily x 10 days.   Right Breast Erythematous thin papules/macules with gritty scale.    Assessment & Plan  AK (actinic keratosis) Right Breast  Destruction of lesion - Right Breast  Destruction method: cryotherapy     Return for as scheduled.  Jaclynn Guarneri, CMA, am acting as scribe for [employer], DO.

## 2023-05-02 ENCOUNTER — Ambulatory Visit: Payer: Medicare HMO | Admitting: Nurse Practitioner

## 2023-05-04 ENCOUNTER — Other Ambulatory Visit: Payer: Self-pay | Admitting: Nurse Practitioner

## 2023-05-04 DIAGNOSIS — I82412 Acute embolism and thrombosis of left femoral vein: Secondary | ICD-10-CM

## 2023-05-09 ENCOUNTER — Other Ambulatory Visit: Payer: Self-pay | Admitting: Nurse Practitioner

## 2023-05-09 DIAGNOSIS — E785 Hyperlipidemia, unspecified: Secondary | ICD-10-CM

## 2023-05-09 DIAGNOSIS — E039 Hypothyroidism, unspecified: Secondary | ICD-10-CM

## 2023-05-23 ENCOUNTER — Telehealth: Payer: Self-pay | Admitting: Family Medicine

## 2023-05-23 NOTE — Telephone Encounter (Signed)
Called and informed pt's husband. He stated that they have an appt with PCP soon and will discuss at that time.

## 2023-05-23 NOTE — Telephone Encounter (Signed)
Pt's husband called and LVM stating that moving forward they will not be able to afford the pt's  ELIQUIS 5 MG TABS tablet. They would like an alternative blood thinner that is cheaper. Please advise.

## 2023-05-28 ENCOUNTER — Other Ambulatory Visit: Payer: Self-pay | Admitting: Nurse Practitioner

## 2023-05-28 DIAGNOSIS — I10 Essential (primary) hypertension: Secondary | ICD-10-CM

## 2023-06-04 ENCOUNTER — Other Ambulatory Visit: Payer: Self-pay | Admitting: Nurse Practitioner

## 2023-06-07 ENCOUNTER — Other Ambulatory Visit: Payer: Self-pay | Admitting: Family Medicine

## 2023-06-09 ENCOUNTER — Encounter: Payer: Self-pay | Admitting: Family Medicine

## 2023-06-09 ENCOUNTER — Other Ambulatory Visit: Payer: Self-pay | Admitting: Nurse Practitioner

## 2023-06-09 ENCOUNTER — Ambulatory Visit (INDEPENDENT_AMBULATORY_CARE_PROVIDER_SITE_OTHER): Payer: Medicare HMO | Admitting: Family Medicine

## 2023-06-09 VITALS — BP 129/65 | HR 70 | Resp 18 | Ht 64.0 in | Wt 150.0 lb

## 2023-06-09 DIAGNOSIS — N390 Urinary tract infection, site not specified: Secondary | ICD-10-CM | POA: Diagnosis not present

## 2023-06-09 DIAGNOSIS — E785 Hyperlipidemia, unspecified: Secondary | ICD-10-CM | POA: Diagnosis not present

## 2023-06-09 DIAGNOSIS — I10 Essential (primary) hypertension: Secondary | ICD-10-CM

## 2023-06-09 DIAGNOSIS — Z131 Encounter for screening for diabetes mellitus: Secondary | ICD-10-CM | POA: Diagnosis not present

## 2023-06-09 DIAGNOSIS — I824Z9 Acute embolism and thrombosis of unspecified deep veins of unspecified distal lower extremity: Secondary | ICD-10-CM

## 2023-06-09 LAB — POCT URINALYSIS DIP (CLINITEK)
Bilirubin, UA: NEGATIVE
Blood, UA: NEGATIVE
Glucose, UA: NEGATIVE mg/dL
Ketones, POC UA: NEGATIVE mg/dL
Leukocytes, UA: NEGATIVE
Nitrite, UA: NEGATIVE
POC PROTEIN,UA: NEGATIVE
Spec Grav, UA: >=1.03 — AB (ref 1.010–1.025)
Urobilinogen, UA: 0.2 U/dL
pH, UA: 6.5 (ref 5.0–8.0)

## 2023-06-09 NOTE — Assessment & Plan Note (Signed)
Urinalysis negative for infection.  Recommended follow-up with urology, patient and husband state that urologist was "useless" and never did anything.  Recurrent UTIs generally seem well-controlled with prophylactic antibiotic.

## 2023-06-09 NOTE — Progress Notes (Signed)
   Established Patient Office Visit  Subjective   Patient ID: Caroline [name], female    DOB: 03-15-1936  Age: 87 y.o. MRN: [id number]  Chief Complaint  Patient presents with   Hypertension    HPI Caroline [name] is a 87 y.o. female presenting today for follow up of hypertension.  Concerns today include cost of Eliquis and ongoing dysuria. Hypertension: Pt denies chest pain, SOB, dizziness, edema, syncope, fatigue or heart palpitations. Taking irbesartan, reports excellent compliance with treatment. Denies side effects.  ROS Negative unless otherwise noted in HPI   Objective:     BP 129/65 (BP Location: Left Arm, Patient Position: Sitting, Cuff Size: Normal)   Pulse 70   Resp 18   Ht 5\' 4"  (1.626 m)   Wt 150 lb (68 kg)   SpO2 95%   BMI 25.75 kg/m   Physical Exam Constitutional:      General: She is not in acute distress.    Appearance: Normal appearance.  HENT:     Head: Normocephalic and atraumatic.  Cardiovascular:     Rate and Rhythm: Normal rate and regular rhythm.     Heart sounds: No murmur heard.    No friction rub. No gallop.  Pulmonary:     Effort: Pulmonary effort is normal. No respiratory distress.     Breath sounds: No wheezing, rhonchi or rales.  Skin:    General: Skin is warm and dry.  Neurological:     Mental Status: She is alert and oriented to person, place, and time.    Results for orders placed or performed in visit on 06/09/23  POCT URINALYSIS DIP (CLINITEK)  Result Value Ref Range   Color, UA yellow yellow   Clarity, UA clear clear   Glucose, UA negative negative mg/dL   Bilirubin, UA negative negative   Ketones, POC UA negative negative mg/dL   Spec Grav, UA >=8.242 (A) 1.010 - 1.025   Blood, UA negative negative   pH, UA 6.5 5.0 - 8.0   POC PROTEIN,UA negative negative, trace   Urobilinogen, UA 0.2 0.2 or 1.0 E.U./dL   Nitrite, UA Negative Negative   Leukocytes, UA Negative Negative     Assessment & Plan:  Essential  hypertension Assessment & Plan: Stable.  Continue irbesartan 75 mg daily.  Will continue to monitor.  Orders: -     CBC with Differential/Platelet; Future -     Comprehensive metabolic panel; Future  Frequent UTI Assessment & Plan: Urinalysis negative for infection.  Recommended follow-up with urology, patient and husband state that urologist was "useless" and never did anything.  Recurrent UTIs generally seem well-controlled with prophylactic antibiotic.  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  I will reach out to neurologist to discuss if any other options may be appropriate to reduce out-of-pocket costs.  In the meantime, continue Eliquis 5 mg daily.   Hyperlipidemia, unspecified hyperlipidemia type -     Lipid panel; Future  Screening for diabetes mellitus -     Hemoglobin A1c; Future    Return in about 4 months (around 10/09/2023) for follow-up for HTN, HLD, fasting blood work 1 week before.   I spent 45 minutes on the day of the encounter to include pre-visit record review, face-to-face time with the patient, and post visit ordering of tests.  Melida Quitter, PA

## 2023-06-09 NOTE — Assessment & Plan Note (Signed)
Currently taking Eliquis 5 mg daily.  I will reach out to neurologist to discuss if any other options may be appropriate to reduce out-of-pocket costs.  In the meantime, continue Eliquis 5 mg daily.

## 2023-06-09 NOTE — Assessment & Plan Note (Signed)
Stable.  Continue irbesartan 75 mg daily.  Will continue to monitor.

## 2023-06-14 ENCOUNTER — Encounter: Payer: Self-pay | Admitting: Family Medicine

## 2023-06-14 ENCOUNTER — Other Ambulatory Visit: Payer: Self-pay | Admitting: Family Medicine

## 2023-06-14 DIAGNOSIS — I824Z9 Acute embolism and thrombosis of unspecified deep veins of unspecified distal lower extremity: Secondary | ICD-10-CM

## 2023-06-14 MED ORDER — DABIGATRAN ETEXILATE MESYLATE 150 MG PO CAPS
150.0000 mg | ORAL_CAPSULE | Freq: Two times a day (BID) | ORAL | 1 refills | Status: DC
Start: 2023-06-14 — End: 2023-08-10

## 2023-06-14 NOTE — Progress Notes (Signed)
Changing Eliquis to dabigatrin 150 mg twice daily for more affordable anticoagulation.  Discussed with neurology that from a stroke prevention standpoint, continuing anticoagulation or switching to aspirin 81 mg daily would be appropriate.  For now, continue anticoagulation therapy with dabigatrin given history of stroke, multiple DVTs, and risk factors for future DVTs.

## 2023-06-14 NOTE — Progress Notes (Signed)
Contacted the neurology provider that the patient most recently had an appointment with to discuss the potential change in Eliquis therapy.  See discussion below.  I will send a prescription of dabigatran to the pharmacy and send a patient message via MyChart to inform them of this change.  Since dabagatrin is available generically, it should alleviate some of the out-of-pocket cost that has been associated with Eliquis.  Shawnie Dapper, NP  Melida Quitter, PA I was able to check with Shanda Bumps at lunch. She reviewed her chart and feels that Pradaxa would most likely be fine. It sounds like she was on Eliquis more for recurrent DVTs and from a stroke perspective we advised she continue. If you switch to Pradaxa I think that will be fine with no other antiplatelet and anticoag needed from a stroke perspective. If she were to discontinue anticoags jess may would want her on a baby aspirin for stroke prevention. Hope that makes sense!  Have a great day!  Amy       Previous Messages    ----- Message ----- From: Melida Quitter, PA Sent: 06/14/2023  12:43 PM EDT To: Shawnie Dapper, NP Subject: RE: Eliquis                                    Thank you so much! I look forward to hearing back from you :) ----- Message ----- From: Shawnie Dapper, NP Sent: 06/14/2023   9:17 AM EDT To: Melida Quitter, PA Subject: RE: Mel Almond there, Lequita Halt! Thank you so much for reaching out. I apologize for my delayed response. I have been out of the office with a family emergency. I saw Mrs Chen last to cover for her primary NP, Ihor Austin. I am going to run this by her just to verify but I think it should be fine to make the switch to dabigatran. Let me confirm and I will get back to you asap. Have a great day!  Amy ----- Message ----- From: Melida Quitter, PA Sent: 06/09/2023   4:43 PM EDT To: Shawnie Dapper, NP Subject: Eliquis                                        Hello, I  recently took over as PCP for our mutual patient and wanted to touch base with you regarding some of her medication management.  I see that she has a history of stroke as well as DVT, which I believe is the reason for ongoing therapy with anticoagulation.  I have not seen anything in the chart from her previous PCP regarding the specific reasoning for continuing Eliquis, and as such I am hesitant to change from Eliquis to another medication.  The patient and her husband have expressed concern for the out-of-pocket cost of Eliquis, so I was curious from a neurological perspective if dabigatran would be an appropriate alternative as it is available generically.  I look forward to hearing you, thank you for your expertise in her care! Lequita Halt

## 2023-06-20 ENCOUNTER — Other Ambulatory Visit: Payer: Self-pay | Admitting: Nurse Practitioner

## 2023-06-20 DIAGNOSIS — I10 Essential (primary) hypertension: Secondary | ICD-10-CM

## 2023-07-15 ENCOUNTER — Other Ambulatory Visit: Payer: Self-pay | Admitting: Family Medicine

## 2023-07-15 DIAGNOSIS — I82412 Acute embolism and thrombosis of left femoral vein: Secondary | ICD-10-CM

## 2023-07-15 DIAGNOSIS — K219 Gastro-esophageal reflux disease without esophagitis: Secondary | ICD-10-CM

## 2023-07-21 ENCOUNTER — Telehealth: Payer: Self-pay | Admitting: Family Medicine

## 2023-07-21 MED ORDER — MEMANTINE HCL 5 MG PO TABS: 5.0000 mg | ORAL_TABLET | Freq: Two times a day (BID) | ORAL | 0 refills | Status: DC

## 2023-07-21 NOTE — Addendum Note (Signed)
Addended by: Marcelina Morel L on: 07/21/2023 11:12 AM   Modules accepted: Orders

## 2023-07-21 NOTE — Telephone Encounter (Signed)
Pt is needing a refill on her memantine (NAMENDA) 5 MG tablet sent in to the [facility] Pt is out and is needing as soon as possible

## 2023-07-21 NOTE — Telephone Encounter (Signed)
Last seen 01/25/23 and next f/u 08/10/23. Saw Amy Lomax,NP last due to Anderson Malta being out on leave (Jessica/Sethi pt).   E-scribed refill to pharmacy.

## 2023-08-08 NOTE — Progress Notes (Unsigned)
[facility] [address] [place]. [place] [address] [phone]       OFFICE FOLLOW UP VISIT NOTE  [name] Date of Birth:  01-01-1936 Medical Record Number:  [id number]   Referring MD: Nada Maclachlan PA-C  Reason for Referral: Stroke  HPI:  Update 08/10/2023 JM: Patient returns for follow-up visit accompanied by her husband. At prior visit with Amy, NP, recommended continuation of Aricept and initiated memantine.   Reports cognition *** Continues on donepezil 10 mg daily and memantine 5 mg twice daily, does have side effects of diarrhea which is chronic ***, no other side effects.  She also continues on quetiapine 25 mg nightly managed by PCP.  Husband assist with most ADLs.  Ambulates with RW, no recent falls.   Denies new stroke/TIA symptoms.  Was switched from Eliquis to Pradaxa due to cost, denies side effects, continues on  atorvastatin.  Routinely follows with PCP for stroke risk factor management.     History provided for reference purposes only Update 01/25/23 ALL: Caroline [name] is a 87 y.o. 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 She was last seen by Shanda Bumps 07/2022. MMSE 20/30. She reported difficulty tolerating donepezil d/t sleepiness and chronic diarrhea. She was switched to Aldarity which was not covered. She was started on rivastigmine. Side effects of insomnia and worsening confusion after first dose reported. It took three days to return to baseline. Aldarity offered but we did not hear back from family. They have continued donepezil 10mg  daily after breakfast. She is also taking quetiapine 25mg  QHS followed by PCP.    She presents with her husband, Caroline [name], who aids in history. She seems to be tolerating donepezil taken with breakfast. Mr [name] has noticed more sleepiness about 2 hours after taking donepezil. He feels that short term memory may be a little worse but overall she seems  fairly stable. Some days are better than others. She needs assistance with most ADLs. She uses her walker but likes to lean forward and walk behind walker. No falls. Diarrhea seems to be a little better. Certain foods and antibiotic therapy seem to make it worse. Imodium PRN helps. She does have difficulty with delusions. She feels that her children are still younger, school aged. They are grown adults. She asks repetitively to go home even though she has lived her her current home for 50 years. No visual or auditory hallucinations. She is finishing treatment for UTI. She is sleeping well. She continues to have vivid dreams but doesn't seem overly bothered by these. No longer using sleep aids.    She continues Eliquis and atorvastatin for stroke prevention. No known TIA/stroke symptoms since last visit. She is followed closely by PCP.   Update 06/30/2022 JM: Patient returns for follow-up visit after prior visit 4 months ago accompanied by her husband.  Overall stable from stroke standpoint without new stroke/TIA symptoms.  Residual visual impairment, stable since prior visit. Also mentions continued cognitive impairment more so in regards to short-term memory difficulties, at times will not recognize her husband or believes her husband is her mother, at times have confusions and nonthreatening hallucinations but has also been dealing with recurrent UTIs and has been unsure if these have been contributing.  At times can be agitated or irritable but no violent behavior or any other behavioral concerns.  He believes overall her cognition has been stable without any significant worsening since prior visit.  She has  remained on Aricept 10 mg daily, was initially taking at night but was having night terrors and talking in her sleep therefore he switched to daytime but can cause some drowsiness.  She is also been having ongoing issues with diarrhea.  Compliant on Eliquis and atorvastatin, denies side effects.  Blood  pressure typically well controlled.  Closely follows with PCP.  No further concerns at this time.  Update 02/04/2022 Dr. [name]: She returns for follow-up after last visit 2 months ago.  She is accompanied by husband and states that she has good days and bad days.  She was from gets disoriented and confused in time she does not recognize even her husband and family members.  She is often talking to people who are dead.  I had started the patient on Namenda which she stopped it a month ago as her husband did not notice any benefit.  She is he has also not noticed that she is any worse after stopping the medicine.  Patient is UTI seem to be much better since she has been started on Macrodantin 50 mg which she takes every other day.  She does get occasionally agitated and irritable but there is no violent behavior.  She does not wander off.  She does sleep quite well and eats well.  She has not tried Aricept in the past and is willing to try it.  On Mini-Mental status exam today she scored 16/30.  Clock drawing 3/4.  Update 12/09/2021 Dr. [name]: She returns for follow-up after last visit 4 months ago.  She is accompanied by her husband.  Patient continues to have short-term memory difficulties as well as at times difficulty finding her way around her own home.  She gets disoriented and confused off-and-on.  He has had recurrent bouts of UTIs which have been treated by her primary care physician is currently on Macrobid.  Se had an episode of severe dizziness on 12/04/2021 and was taken to the emergency room by EMS blood pressure was found to be significantly elevated.  An MRI scan of the brain was obtained which I personally reviewed did not show any acute abnormality and showed old left PCA infarct.  She continues to ambulate with a walker and history of pulmonary no falls injuries.  She is tolerating Eliquis well without bleeding or bruising..  Lab work on 10/22/2021 showed LDL cholesterol to be near optimal at 73  hemoglobin A1c at 5.7.  Initial visit 08/05/2021 Dr. [name]: Ms. [name] is a 87 year old pleasant Caucasian lady seen for initial office consultation visit for stroke.  She is accompanied by her daughter and husband.  History is obtained from them and review of electronic medical records and I personally reviewed pertinent available imaging films in PACS.  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  She presented on 06/19/2021 on postoperative day 1 for confusion, delirium and lethargy.  This was initially felt to be blood pressure and he was taken to the hospital by EMS.  He had an MRI scan of the brain personally reviewed.  Shows old left cerebellar infarct.  Continues to have peripheral vision loss, stroke improved.  Continues to walk with a walker, but no falls or injuries.  She is tolerating well without bruising or bleeding.  Blood pressure is under good control.  Tolerating Lipitor without muscle aches and.  .  She was also  felt to have underlying low-grade dementia which was exacerbated by the surgery.  Her symptoms continued for couple of days and noncontrast CT scan of the head was obtained which showed acute left posterior cerebral artery stroke.  Her NIH was 4 on admission she is not a candidate for tPA due to recent surgery.  MRI scan was subsequently obtained which showed a left posterior cerebral artery infarct and MRA showed left P2 occlusion.  2D echo showed ejection fraction of 60 to 65% without cardiac source of embolism.  LDL cholesterol was elevated at 123 mg percent and hemoglobin A1c was 5.5.  She was initially placed on dual antiplatelet therapy but subsequently on 07/30/2021 she was found to have a left common femoral vein deep vein thrombosis for which she has subsequently been switched to Xarelto though aspirin  has also been continued pending my opinion today.  Patient continues to have right-sided peripheral vision loss.  She has been ambulating with a walker and has been careful.  She has had no falls or injuries.  The family has noticed worsening of her cognitive difficulties with patient having more frequent sundowning for which she has been started on Seroquel 25 mg at night which helps her sleep well.  There are no delusions or hallucinations during the day.  She has more short-term memory difficulties and intermittent confusion which is new.  She does have 24-hour care at home and cannot be left alone.         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 Cheek   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   Bridge Left Nose, and Left Neck   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 Cheek Tx: curret x 3 and 5FU   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   Bridge of nose tx Cx3 and 5FU, Left neck Tx Cx3 and 5FU   Thrombophlebitis     Social History:  Social History   Socioeconomic History   Marital status: Married    Spouse name: [name]   Number of children: 3   Years of education: 12   Highest education level: Not on file  Occupational History   Occupation: retired  Tobacco Use   Smoking status: Never    Passive exposure: Never   Smokeless  tobacco: Never  Vaping Use   Vaping status: Never Used  Substance and Sexual Activity   Alcohol use: No   Drug use: Not Currently   Sexual activity: Never  Other Topics Concern   Not on file  Social History Narrative   Lives with husband   Right Handed   Drinks no caffeine   Social Determinants of Health   Financial Resource Strain: Low Risk  (  08/30/2022)   Overall Financial Resource Strain (CARDIA)    Difficulty of Paying Living Expenses: Not hard at all  Food Insecurity: No Food Insecurity (08/30/2022)   Hunger Vital Sign    Worried About Running Out of Food in the Last Year: Never true    Ran Out of Food in the Last Year: Never true  Transportation Needs: No Transportation Needs (08/30/2022)   PRAPARE - Administrator, Civil Service (Medical): No    Lack of Transportation (Non-Medical): No  Physical Activity: Insufficiently Active (08/30/2022)   Exercise Vital Sign    Days of Exercise per Week: 2 days    Minutes of Exercise per Session: 20 min  Stress: Stress Concern Present (08/30/2022)   [employer] of Occupational Health - Occupational Stress Questionnaire    Feeling of Stress : To some extent  Social Connections: Moderately Isolated (08/30/2022)   Social Connection and Isolation Panel [NHANES]    Frequency of Communication with Friends and Family: More than three times a week    Frequency of Social Gatherings with Friends and Family: Twice a week    Attends Religious Services: Never    Database administrator or Organizations: No    Attends Banker Meetings: Never    Marital Status: Married  Catering manager Violence: Not At Risk (08/30/2022)   Humiliation, Afraid, Rape, and Kick questionnaire    Fear of Current or Ex-Partner: No    Emotionally Abused: No    Physically Abused: No    Sexually Abused: No    Medications:   Current Outpatient Medications on File Prior to Visit  Medication Sig Dispense Refill   acetaminophen  (TYLENOL) 325 MG tablet Take 2 tablets (650 mg total) by mouth every 6 (six) hours as needed for mild pain, moderate pain, fever or headache.     Ascorbic Acid (VITAMIN C) 1000 MG tablet Take 1,000 mg by mouth daily.     atorvastatin (LIPITOR) 80 MG tablet TAKE 1 TABLET (80 MG TOTAL) BY MOUTH AT BEDTIME. 90 tablet 2   calcium carbonate ([employer] CALCIUM CARBONATE) 1250 (500 Ca) MG tablet Take 2 tablets (2,500 mg total) by mouth daily. 30 tablet 0   Catheters MISC Use as directed. 1 box of inserts and 1 box canisters. 1 each 3   cholecalciferol (VITAMIN D3) 25 MCG (1000 UNIT) tablet Take 0.5 tablets (500 Units total) by mouth daily.     dabigatran (PRADAXA) 150 MG CAPS capsule Take 1 capsule (150 mg total) by mouth 2 (two) times daily. 180 capsule 1   donepezil (ARICEPT) 10 MG tablet Take 1 tablet (10 mg total) by mouth daily. 90 tablet 1   ELIQUIS 5 MG TABS tablet TAKE 1 TABLET BY MOUTH 2 TIMES DAILY. 180 tablet 0   furosemide (LASIX) 20 MG tablet Take 1 tablet (20 mg total) by mouth daily as needed for edema. 90 tablet 0   irbesartan (AVAPRO) 75 MG tablet TAKE 1 TABLET (75 MG TOTAL) BY MOUTH DAILY. 90 tablet 0   levothyroxine (SYNTHROID) 125 MCG tablet TAKE 1 TABLET (125 MCG TOTAL) BY MOUTH DAILY AT 6 AM. 90 tablet 0   meclizine (ANTIVERT) 25 MG tablet Take 0.5 tablets (12.5 mg total) by mouth 3 (three) times daily as needed for dizziness. 30 tablet 0   memantine (NAMENDA) 5 MG tablet Take 1 tablet (5 mg total) by mouth 2 (two) times daily. 180 tablet 0   nystatin-triamcinolone ointment (MYCOLOG) Apply 1 Application topically 2 (two) times  daily. Apply under breast area 2 times daily until clear 30 g 2   ondansetron (ZOFRAN) 4 MG tablet Take 1 tablet (4 mg total) by mouth every 6 (six) hours. (Patient taking differently: Take 4 mg by mouth every 8 (eight) hours as needed for nausea or vomiting.) 12 tablet 0   pantoprazole (PROTONIX) 40 MG tablet TAKE 1 TABLET BY MOUTH DAILY 90 tablet 0   QUEtiapine  (SEROQUEL) 25 MG tablet TAKE 1 TABLET (25 MG TOTAL) BY MOUTH AT BEDTIME. 90 tablet 0   Vibegron (GEMTESA) 75 MG TABS Take 75 mg by mouth every other day. 28 tablet 0   vitamin B-12 (CYANOCOBALAMIN) 250 MCG tablet Take 1 tablet (250 mcg total) by mouth daily. 30 tablet 0   No current facility-administered medications on file prior to visit.    Allergies:   Allergies  Allergen Reactions   Gluten Meal Nausea And Vomiting    Patient has CELIAC DISEASE   Wheat Nausea And Vomiting    Patient has CELIAC DISEASE   Adhesive [Tape] Other (See Comments)    Patient's skin is VERY THIN and tears very easily; please use either paper tape or Coban wrap   Codeine Nausea And Vomiting and Other (See Comments)    other   Other Other (See Comments)    Patient has Hypoglycemia; her husband stated "when her sugar crashes, it crashes hard."   Penicillins Other (See Comments)    Doesn't remember reaction , but denies any SOB/Swelling States " it was a long time ago "     Physical Exam There were no vitals filed for this visit.  There is no height or weight on file to calculate BMI.   General: Frail very pleasant elderly Caucasian lady, seated, in no evident distress Head: head normocephalic and atraumatic.   Neck: supple with no carotid or supraclavicular bruits Cardiovascular: regular rate and rhythm, no murmurs Musculoskeletal: Severe kyphoscoliosis. Skin:  no rash/petichiae Vascular:  Normal pulses all extremities  Neurologic Exam Mental Status: Awake and fully alert. Oriented to place and time. Recent and remote memory impaired. Attention span, concentration and fund of knowledge impaired. Mood and affect appropriate.  Cranial Nerves: Pupils equal, briskly reactive to light. Extraocular movements full without nystagmus. Visual fields show dense right homonymous hemianopsia to confrontation. Hearing intact. Facial sensation intact. Face, tongue, palate moves normally and symmetrically.  Motor:  Normal bulk and tone. Normal strength in all tested extremity muscles. Sensory.: intact to touch , pinprick , position and vibratory sensation.  Coordination: Rapid alternating movements normal in all extremities. Finger-to-nose and heel-to-shin performed accurately bilaterally. Gait and Station: Arises from chair without difficulty.  stance is nearly stooped. . Gait demonstrates slight imbalance with use of rolling walker.  Not able to heel, toe and tandem walk  Reflexes: 1+ and symmetric. Toes downgoing.       01/25/2023    9:15 AM 06/30/2022    2:13 PM 02/04/2022   10:55 AM 11/24/2020   11:28 AM  MMSE - Mini Mental State Exam  Orientation to time 0 1 0 5  Orientation to Place 4 5 4 5   Registration 3 3 3 3   Attention/ Calculation 1 5 1 3   Recall 1 0 0 2  Language- name 2 objects 2 2 2 2   Language- repeat 1 1 1 1   Language- follow 3 step command 3 1 3 3   Language- read & follow direction 1 1 1 1   Write a sentence 1 1 1  1  Copy design 1 0 0 0  Total score 18 20 16 26         ASSESSMENT/PLAN: 87 year old Caucasian lady with left posterior cerebral artery infarction secondary to left P2 occlusion in 06/2021 with significant residual right-sided homonymous hemianopsia as well as poststroke mild vascular dementia.  Multiple vascular risk factors of hypertension , hyperlipidemia and age.    Left PCA stroke -Continue Pradaxa and atorvastatin for secondary stroke prevention measures and history of DVT managed/prescribed by PCP. Previously on Eliquis, transitioned to Pradaxa 06/2023 d/t cost of Eliquis -Continue close PCP follow-up for aggressive stroke risk factor management including BP goal<130/90, and HLD with LDL goal<70    Mild vascular dementia Age-related cognitive impairment -MMSE today ***/30 (prior 18/30) -Continue donepezil 10 mg daily and memantine 5 mg twice daily -discussed indication for cholinesterase inhibitors which is to stabilize memory and help slow progression,  advised these medication are not going to improve current level of impairment  -prior c/o increased sleepiness and chronic diarrhea on donepezil, attempted to switch to Aldarity but not covered by insurance until trial of rivastigmine, rivastigmine caused insomnia and increased confusion. Eventually restarted oral donepezil and currently tolerating well -discussed memory compensation strategies and advised her to increase participation in cognitively challenging activities like solving crossword puzzles, playing bridge and sodoku    Follow-up in 6 months or call earlier if needed    I spent *** minutes of face-to-face and non-face-to-face time with patient.  This included previsit chart review, lab review, study review, order entry, electronic health record documentation, patient education and discussion regarding above diagnoses and treatment plan and answered all other questions to patient's satisfaction  Ihor Austin, [facility]  [facility] [address] [place], [place] [address]  Phone [phone] Fax [phone] Note: This document was prepared with digital dictation and possible smart phrase technology. Any transcriptional errors that result from this process are unintentional.

## 2023-08-10 ENCOUNTER — Encounter: Payer: Self-pay | Admitting: Adult Health

## 2023-08-10 ENCOUNTER — Ambulatory Visit: Payer: Medicare HMO | Admitting: Adult Health

## 2023-08-10 VITALS — BP 134/61 | HR 72 | Ht 63.0 in | Wt 150.0 lb

## 2023-08-10 DIAGNOSIS — I63532 Cerebral infarction due to unspecified occlusion or stenosis of left posterior cerebral artery: Secondary | ICD-10-CM | POA: Diagnosis not present

## 2023-08-10 DIAGNOSIS — F01A0 Vascular dementia, mild, without behavioral disturbance, psychotic disturbance, mood disturbance, and anxiety: Secondary | ICD-10-CM

## 2023-08-10 MED ORDER — DONEPEZIL HCL 10 MG PO TABS: 10.0000 mg | ORAL_TABLET | Freq: Every day | ORAL | 3 refills | Status: DC

## 2023-08-10 MED ORDER — MEMANTINE HCL 5 MG PO TABS: 5.0000 mg | ORAL_TABLET | Freq: Two times a day (BID) | ORAL | 3 refills | Status: DC

## 2023-08-10 NOTE — Patient Instructions (Addendum)
Your Plan:  Continue donepezil 10mg  daily and memantine 5mg  twice daily  [facility] Dr. Karleen Hampshire  Address: [address], [place], [place] [address] Hours:  Phone: [phone]  Continue Eliquis and atorvastatin at current dosage  Continue to follow with PCP for stroke risk factor management      Follow up in 6 months or call earlier if needed      Thank you for coming to see [place] at [facility]. I hope we have been able to provide you high quality care today.  You may receive a patient satisfaction survey over the next few weeks. We would appreciate your feedback and comments so that we may continue to improve ourselves and the health of our patients.

## 2023-08-11 ENCOUNTER — Other Ambulatory Visit: Payer: Self-pay | Admitting: Family Medicine

## 2023-08-11 DIAGNOSIS — E039 Hypothyroidism, unspecified: Secondary | ICD-10-CM

## 2023-08-15 ENCOUNTER — Telehealth: Payer: Self-pay | Admitting: Adult Health

## 2023-08-15 DIAGNOSIS — F01A0 Vascular dementia, mild, without behavioral disturbance, psychotic disturbance, mood disturbance, and anxiety: Secondary | ICD-10-CM

## 2023-08-15 DIAGNOSIS — I63532 Cerebral infarction due to unspecified occlusion or stenosis of left posterior cerebral artery: Secondary | ICD-10-CM

## 2023-08-15 DIAGNOSIS — H53461 Homonymous bilateral field defects, right side: Secondary | ICD-10-CM

## 2023-08-15 NOTE — Telephone Encounter (Signed)
Spouse reports that a referral is needed for pt to see Dr [facility])

## 2023-08-15 NOTE — Telephone Encounter (Signed)
Referral was placed for the pt as requested

## 2023-08-16 ENCOUNTER — Telehealth: Payer: Self-pay | Admitting: Adult Health

## 2023-08-16 NOTE — Telephone Encounter (Signed)
Opthalmology referral faxed to [facility] (fax# [phone], phone# [phone])

## 2023-08-22 ENCOUNTER — Encounter: Payer: Self-pay | Admitting: Podiatry

## 2023-08-22 ENCOUNTER — Ambulatory Visit: Payer: Medicare HMO | Admitting: Podiatry

## 2023-08-22 DIAGNOSIS — M79674 Pain in right toe(s): Secondary | ICD-10-CM

## 2023-08-22 DIAGNOSIS — D689 Coagulation defect, unspecified: Secondary | ICD-10-CM | POA: Diagnosis not present

## 2023-08-22 DIAGNOSIS — M79675 Pain in left toe(s): Secondary | ICD-10-CM | POA: Diagnosis not present

## 2023-08-22 DIAGNOSIS — B351 Tinea unguium: Secondary | ICD-10-CM | POA: Diagnosis not present

## 2023-08-22 NOTE — Progress Notes (Signed)
This patient returns to my office for at risk foot care.  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  This patient is unable to cut nails herself since the patient cannot reach her nails.These nails are painful walking and wearing shoes.  She presents to the office with her husband.  This patient presents for at risk foot care today.  General Appearance  Alert, conversant and in no acute stress.  Vascular  Dorsalis pedis and posterior tibial  pulses are  weakly palpable  bilaterally.  Capillary return is within normal limits  bilaterally. Temperature is within normal limits  bilaterally.  Neurologic  Senn-Weinstein monofilament wire test within normal limits  bilaterally. Muscle power within normal limits bilaterally.  Nails Thick disfigured discolored nails with subungual debris  hallux  nails bilaterally. No evidence of bacterial infection or drainage bilaterally.  Orthopedic  No limitations of motion  feet .  No crepitus or effusions noted.  No bony pathology or digital deformities noted.  Skin  normotropic skin with no porokeratosis noted bilaterally.  No signs of infections or ulcers noted.     Onychomycosis  Pain in right toes  Pain in left toes  Consent was obtained for treatment procedures.   Mechanical debridement of nails 1-5  bilaterally performed with a nail nipper.  Filed with dremel without incident.    Return office visit    4 months                  Told patient to return for periodic foot care and evaluation due to potential at risk complications.   Helane Gunther DPM

## 2023-09-03 ENCOUNTER — Other Ambulatory Visit: Payer: Self-pay | Admitting: Family Medicine

## 2023-09-05 ENCOUNTER — Encounter: Payer: Medicare HMO | Admitting: Nurse Practitioner

## 2023-09-06 ENCOUNTER — Ambulatory Visit (INDEPENDENT_AMBULATORY_CARE_PROVIDER_SITE_OTHER): Payer: Medicare HMO

## 2023-09-06 DIAGNOSIS — Z0000 Encounter for general adult medical examination without abnormal findings: Secondary | ICD-10-CM | POA: Diagnosis not present

## 2023-09-06 NOTE — Progress Notes (Signed)
Subjective:   Caroline [name] is a 87 y.o. female who presents for Medicare Annual (Subsequent) preventive examination.  Visit Complete: Virtual I connected with  Adrian Prince on 09/06/23 by a audio enabled telemedicine application and verified that I am speaking with the correct person using two identifiers. Husband was also on call.  Patient Location: Home  Provider Location: Office/Clinic  I discussed the limitations of evaluation and management by telemedicine. The patient expressed understanding and agreed to proceed.  Vital Signs: Because this visit was a virtual/telehealth visit, some criteria may be missing or patient reported. Any vitals not documented were not able to be obtained and vitals that have been documented are patient reported.    Cardiac Risk Factors include: advanced age (>46men, >33 women);hypertension     Objective:    Today's Vitals   09/06/23 0958  PainSc: 3    There is no height or weight on file to calculate BMI.     09/06/2023   10:07 AM 06/17/2022   10:06 AM 05/29/2022   10:08 AM 12/28/2021   11:10 AM 06/24/2021    1:25 PM 06/17/2021    8:00 PM 06/15/2021   12:20 PM  Advanced Directives  Does Patient Have a Medical Advance Directive? Yes Yes No Yes No No No  Type of Estate agent of [place];Living will Healthcare Power of [place];Living will  Healthcare Power of [place];Living will     Does patient want to make changes to medical advance directive?    No - Patient declined     Copy of Healthcare Power of Attorney in Chart? No - copy requested        Would patient like information on creating a medical advance directive?   No - Patient declined  No - Patient declined No - Patient declined No - Patient declined    Current Medications (verified) Outpatient Encounter Medications as of 09/06/2023  Medication Sig   acetaminophen (TYLENOL) 325 MG tablet Take 2 tablets (650 mg total) by mouth every 6 (six) hours as needed for mild  pain, moderate pain, fever or headache.   Ascorbic Acid (VITAMIN C) 1000 MG tablet Take 1,000 mg by mouth daily.   atorvastatin (LIPITOR) 80 MG tablet TAKE 1 TABLET (80 MG TOTAL) BY MOUTH AT BEDTIME.   calcium carbonate ([facility] CALCIUM CARBONATE) 1250 (500 Ca) MG tablet Take 2 tablets (2,500 mg total) by mouth daily.   cholecalciferol (VITAMIN D3) 25 MCG (1000 UNIT) tablet Take 0.5 tablets (500 Units total) by mouth daily.   donepezil (ARICEPT) 10 MG tablet Take 1 tablet (10 mg total) by mouth daily.   ELIQUIS 5 MG TABS tablet TAKE 1 TABLET BY MOUTH 2 TIMES DAILY.   furosemide (LASIX) 20 MG tablet Take 1 tablet (20 mg total) by mouth daily as needed for edema.   irbesartan (AVAPRO) 75 MG tablet TAKE 1 TABLET (75 MG TOTAL) BY MOUTH DAILY.   levothyroxine (SYNTHROID) 125 MCG tablet TAKE 1 TABLET (125 MCG TOTAL) BY MOUTH DAILY AT 6 AM.   memantine (NAMENDA) 5 MG tablet Take 1 tablet (5 mg total) by mouth 2 (two) times daily.   pantoprazole (PROTONIX) 40 MG tablet TAKE 1 TABLET BY MOUTH DAILY   QUEtiapine (SEROQUEL) 25 MG tablet TAKE 1 TABLET (25 MG TOTAL) BY MOUTH AT BEDTIME.   vitamin B-12 (CYANOCOBALAMIN) 250 MCG tablet Take 1 tablet (250 mcg total) by mouth daily.   Catheters MISC Use as directed. 1 box of inserts and 1 box canisters.  meclizine (ANTIVERT) 25 MG tablet Take 0.5 tablets (12.5 mg total) by mouth 3 (three) times daily as needed for dizziness. (Patient not taking: Reported on 08/10/2023)   ondansetron (ZOFRAN) 4 MG tablet Take 1 tablet (4 mg total) by mouth every 6 (six) hours. (Patient not taking: Reported on 09/06/2023)   No facility-administered encounter medications on file as of 09/06/2023.    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 Cheek   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   Bridge Left Nose, and Left Neck   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 Cheek Tx: curret x 3 and 5FU   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   Bridge of nose tx Cx3 and 5FU, Left neck Tx Cx3 and 5FU   Thrombophlebitis    Past Surgical History:  Procedure Laterality Date   ABDOMINAL HYSTERECTOMY  1979   CATARACT EXTRACTION, BILATERAL     knee surgery Left    VARICOSE VEIN SURGERY Left    XI ROBOTIC ASSISTED PARAESOPHAGEAL HERNIA REPAIR N/A 06/15/2021   Procedure: XI ROBOTIC ASSISTED PARAESOPHAGEAL HERNIA REPAIR, GASTROPEXY, UPPER ENDOSCOPY;  Surgeon: Berna Bue, MD;  Location: [facility];  Service: General;  Laterality: N/A;   Family History  Problem Relation Age of Onset   Arthritis Mother        pt denies   Hypertension Mother        pt denies   Obesity Sister    Cancer Maternal Uncle        type unknown   Colon cancer Neg Hx    Stomach cancer Neg Hx    Esophageal cancer Neg Hx    Pancreatic cancer Neg Hx    Social History   Socioeconomic History   Marital status: Married    Spouse name: [name]   Number of children: 3   Years of education: 12   Highest education level: Not on file  Occupational History   Occupation: retired  Tobacco Use   Smoking status: Never    Passive exposure: Never   Smokeless tobacco: Never  Vaping Use   Vaping status: Never Used  Substance and Sexual Activity   Alcohol use: No   Drug use: Not Currently   Sexual activity: Never  Other Topics Concern   Not on file  Social History  Narrative   Lives with husband   Right Handed   Drinks no caffeine   Social Determinants of Health   Financial Resource Strain: Low Risk  (09/06/2023)   Overall Financial Resource Strain (CARDIA)    Difficulty of Paying Living Expenses: Not hard at all  Food Insecurity: No Food Insecurity (09/06/2023)   Hunger Vital Sign    Worried About Running Out of Food in the Last Year:  Never true    Ran Out of Food in the Last Year: Never true  Transportation Needs: No Transportation Needs (09/06/2023)   PRAPARE - Administrator, Civil Service (Medical): No    Lack of Transportation (Non-Medical): No  Physical Activity: Sufficiently Active (09/06/2023)   Exercise Vital Sign    Days of Exercise per Week: 3 days    Minutes of Exercise per Session: 60 min  Stress: No Stress Concern Present (09/06/2023)   [employer] of Occupational Health - Occupational Stress Questionnaire    Feeling of Stress : Not at all  Social Connections: Moderately Isolated (09/06/2023)   Social Connection and Isolation Panel [NHANES]    Frequency of Communication with Friends and Family: More than three times a week    Frequency of Social Gatherings with Friends and Family: Not on file    Attends Religious Services: Never    Database administrator or Organizations: No    Attends Engineer, structural: Never    Marital Status: Married    Tobacco Counseling Counseling given: Not Answered   Clinical Intake:  Pre-visit preparation completed: Yes  Pain : 0-10 Pain Score: 3  Pain Type: Chronic pain Pain Location: Back Pain Orientation: Lower Pain Descriptors / Indicators: Aching Pain Onset: More than a month ago Pain Frequency: Constant     Nutritional Risks: None Diabetes: No  How often do you need to have someone help you when you read instructions, pamphlets, or other written materials from your doctor or pharmacy?: 3 - Sometimes  Interpreter Needed?: No  Information entered by  :: NAllen LPN   Activities of Daily Living    09/06/2023   10:01 AM  In your present state of health, do you have any difficulty performing the following activities:  Hearing? 0  Vision? 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 1  Walking or climbing stairs? 1  Dressing or bathing? 1  Doing errands, shopping? 1  Preparing Food and eating ? N  Using the Toilet? N  In the past six months, have you accidently leaked urine? Y  Comment incontinence  Do you have problems with loss of bowel control? N  Managing your Medications? Y  Managing your Finances? Y  Housekeeping or managing your Housekeeping? Y    Patient Care Team: Melida Quitter, PA as PCP - General (Family Medicine) Shawnie Dapper, NP as Nurse Practitioner (Neurology)  Indicate any recent Medical Services you may have received from other than [facility] providers in the past year (date may be approximate).     Assessment:   This is a routine wellness examination for [employer].  Hearing/Vision screen Hearing Screening - Comments:: Denies hearing issues Vision Screening - Comments:: Regular eye exams,    Goals Addressed             This Visit's Progress    Patient Stated       09/06/2023, stay alive       Depression Screen    09/06/2023   10:08 AM 08/30/2022    9:44 AM 05/27/2022    2:16 PM 01/20/2022    1:40 PM 12/10/2021   10:48 AM 10/21/2021    2:00 PM 07/28/2021    1:35 PM  PHQ 2/9 Scores  PHQ - 2 Score 0 1 2 1 2  0 2  PHQ- 9 Score  4 9 3   0 11    Fall Risk    09/06/2023  10:07 AM 06/17/2022   10:06 AM 05/27/2022    2:16 PM 01/20/2022    1:41 PM 12/10/2021   10:47 AM  Fall Risk   Falls in the past year? 0 0 0 1 1  Number falls in past yr: 0  0 1 0  Comment     mid February 2023  Injury with Fall? 0  0 0 0  Comment     bruised only  Risk for fall due to : Medication side effect;Impaired mobility;Impaired balance/gait  Impaired balance/gait;Impaired mobility   Impaired balance/gait  Follow up Falls prevention discussed;Falls evaluation completed  Falls evaluation completed Falls evaluation completed     MEDICARE RISK AT HOME: Medicare Risk at Home Any stairs in or around the home?: Yes If so, are there any without handrails?: No Home free of loose throw rugs in walkways, pet beds, electrical cords, etc?: Yes Adequate lighting in your home to reduce risk of falls?: Yes Life alert?: No Use of a cane, walker or w/c?: Yes Grab bars in the bathroom?: Yes Shower chair or bench in shower?: Yes Elevated toilet seat or a handicapped toilet?: Yes  TIMED UP AND GO:  Was the test performed?  No    Cognitive Function:  6 CIT not administered. Patient diagnosed with delirium and confusion.       08/10/2023   11:13 AM 01/25/2023    9:15 AM 06/30/2022    2:13 PM 02/04/2022   10:55 AM 11/24/2020   11:28 AM  MMSE - Mini Mental State Exam  Orientation to time 1 0 1 0 5  Orientation to Place 3 4 5 4 5   Registration 3 3 3 3 3   Attention/ Calculation 5 1 5 1 3   Recall 1 1 0 0 2  Language- name 2 objects 2 2 2 2 2   Language- repeat 1 1 1 1 1   Language- follow 3 step command 2 3 1 3 3   Language- read & follow direction 1 1 1 1 1   Write a sentence 1 1 1 1 1   Copy design 0 1 0 0 0  Total score 20 18 20 16 26       12/09/2021    3:32 PM  Montreal Cognitive Assessment   Visuospatial/ Executive (0/5) 0  Naming (0/3) 2  Attention: Read list of digits (0/2) 2  Attention: Read list of letters (0/1) 0  Attention: Serial 7 subtraction starting at 100 (0/3) 2  Language: Repeat phrase (0/2) 0  Language : Fluency (0/1) 0  Abstraction (0/2) 2  Delayed Recall (0/5) 0  Orientation (0/6) 3  Total 11  Adjusted Score (based on education) 11      08/30/2022    9:28 AM  6CIT Screen  What Year? 4 points  What month? 3 points  What time? 0 points  Count back from 20 0 points  Months in reverse 4 points  Repeat phrase 4 points  Total Score 15 points     Immunizations Immunization History  Administered Date(s) Administered   Fluad Quad(high Dose 65+) 08/04/2021, 07/08/2022, 06/19/2023   PFIZER(Purple Top)SARS-COV-2 Vaccination 10/29/2019, 11/19/2019, 09/15/2020   PNEUMOCOCCAL CONJUGATE-20 02/12/2022   Zoster Recombinant(Shingrix) 11/07/2020, 11/30/2022    TDAP status: Due, Education has been provided regarding the importance of this vaccine. Advised may receive this vaccine at local pharmacy or Health Dept. Aware to provide a copy of the vaccination record if obtained from local pharmacy or Health Dept. Verbalized acceptance and understanding.  Flu Vaccine status: Up to  date  Pneumococcal vaccine status: Up to date  Covid-19 vaccine status: Information provided on how to obtain vaccines.   Qualifies for Shingles Vaccine? Yes   Zostavax completed Yes   Shingrix Completed?: Yes  Screening Tests Health Maintenance  Topic Date Due   DTaP/Tdap/Td (1 - Tdap) Never done   COVID-19 Vaccine (4 - 2023-24 season) 09/22/2023 (Originally 06/05/2023)   Medicare Annual Wellness (AWV)  09/05/2024   Pneumonia Vaccine 20+ Years old  Completed   INFLUENZA VACCINE  Completed   Zoster Vaccines- Shingrix  Completed   HPV VACCINES  Aged Out   DEXA SCAN  Discontinued    Health Maintenance  Health Maintenance Due  Topic Date Due   DTaP/Tdap/Td (1 - Tdap) Never done    Colorectal cancer screening: No longer required.   Mammogram status: No longer required due to age.  Bone Density status: n/a  Lung Cancer Screening: (Low Dose CT Chest recommended if Age 48-80 years, 20 pack-year currently smoking OR have quit w/in 15years.) does not qualify.   Lung Cancer Screening Referral: no  Additional Screening:  Hepatitis C Screening: does not qualify;   Vision Screening: Recommended annual ophthalmology exams for early detection of glaucoma and other disorders of the eye. Is the patient up to date with their annual eye exam?  Yes  Who is the  provider or what is the name of the office in which the patient attends annual eye exams? Looking for new doctor If pt is not established with a provider, would they like to be referred to a provider to establish care? No .   Dental Screening: Recommended annual dental exams for proper oral hygiene  Diabetic Foot Exam: n/a  Community Resource Referral / Chronic Care Management: CRR required this visit?  No   CCM required this visit?  No     Plan:     I have personally reviewed and noted the following in the patient's chart:   Medical and social history Use of alcohol, tobacco or illicit drugs  Current medications and supplements including opioid prescriptions. Patient is not currently taking opioid prescriptions. Functional ability and status Nutritional status Physical activity Advanced directives List of other physicians Hospitalizations, surgeries, and ER visits in previous 12 months Vitals Screenings to include cognitive, depression, and falls Referrals and appointments  In addition, I have reviewed and discussed with patient certain preventive protocols, quality metrics, and best practice recommendations. A written personalized care plan for preventive services as well as general preventive health recommendations were provided to patient.     Barb Merino, LPN   [id number]   After Visit Summary: (MyChart) Due to this being a telephonic visit, the after visit summary with patients personalized plan was offered to patient via MyChart   Nurse Notes: none

## 2023-09-06 NOTE — Patient Instructions (Addendum)
Caroline [name] , Thank you for taking time to come for your Medicare Wellness Visit. I appreciate your ongoing commitment to your health goals. Please review the following plan we discussed and let me know if I can assist you in the future.   Referrals/Orders/Follow-Ups/Clinician Recommendations: none  This is a list of the screening recommended for you and due dates:  Health Maintenance  Topic Date Due   DTaP/Tdap/Td vaccine (1 - Tdap) Never done   COVID-19 Vaccine (4 - 2023-24 season) 09/22/2023*   Medicare Annual Wellness Visit  09/05/2024   Pneumonia Vaccine  Completed   Flu Shot  Completed   Zoster (Shingles) Vaccine  Completed   HPV Vaccine  Aged Out   DEXA scan (bone density measurement)  Discontinued  *Topic was postponed. The date shown is not the original due date.    Advanced directives: (Copy Requested) Please bring a copy of your health care power of attorney and living will to the office to be added to your chart at your convenience.  Next Medicare Annual Wellness Visit scheduled for next year: No, schedule not open for next year  Insert Preventive Care attachment Insert FALL PREVENTION attachment if needed

## 2023-09-08 ENCOUNTER — Encounter: Payer: Self-pay | Admitting: Dermatology

## 2023-09-08 ENCOUNTER — Ambulatory Visit: Payer: Medicare HMO | Admitting: Dermatology

## 2023-09-08 VITALS — BP 128/93 | HR 72

## 2023-09-08 DIAGNOSIS — L570 Actinic keratosis: Secondary | ICD-10-CM | POA: Diagnosis not present

## 2023-09-08 DIAGNOSIS — L304 Erythema intertrigo: Secondary | ICD-10-CM

## 2023-09-08 DIAGNOSIS — W908XXA Exposure to other nonionizing radiation, initial encounter: Secondary | ICD-10-CM

## 2023-09-08 DIAGNOSIS — L578 Other skin changes due to chronic exposure to nonionizing radiation: Secondary | ICD-10-CM

## 2023-09-08 DIAGNOSIS — L814 Other melanin hyperpigmentation: Secondary | ICD-10-CM

## 2023-09-08 MED ORDER — ECONAZOLE NITRATE 1 % EX CREA: TOPICAL_CREAM | Freq: Every day | CUTANEOUS | 0 refills | Status: DC

## 2023-09-08 NOTE — Patient Instructions (Addendum)
Hello Caroline [name],  Thank you for visiting [place] today. We appreciate your commitment to improving your health and effectively managing your skin conditions. Here is a summary of the key instructions from today's consultation:  - Econazole Nitrate Cream: Discontinue using Nystatin Triamcinolone and switch to Econazole Nitrate Cream. Apply this to the affected area under the breast twice a day until clear. A prescription for this medication will be sent to you.   - Application: Apply the cream to the affected area under the breast twice daily.   - Duration: Continue until the area is clear.  - Zeasorb Powder: Continue using Zeasorb Powder daily to keep the area dry and prevent fungal infections. This powder, containing Clotrimazole, is available at [facility], [facility], or [facility].  - Hair Growth Management: Use tweezers as needed for the hair growth issue. Consider electrolysis for a more permanent solution if the issue persists.  - Actinic Keratoses Treatment: The actinic keratoses on your left temple, cheek, and shoulder have been treated with liquid nitrogen today. Apply Aquaphor Healing Ointment daily to these spots until they heal.  - Follow-Up: We have scheduled a follow-up appointment in six months to assess and touch up any remaining or new spots.  We hope these measures will effectively manage your conditions. Have a wonderful holiday season and a Happy New Year. We look forward to seeing you again in six months for a follow-up.  Best regards,  Dr. [facility]        Important Information  Due to recent changes in healthcare laws, you may see results of your pathology and/or laboratory studies on MyChart before the doctors have had a chance to review them. We understand that in some cases there may be results that are confusing or concerning to you. Please understand that not all results are received at the same time and often the doctors may need to interpret multiple results in  order to provide you with the best plan of care or course of treatment. Therefore, we ask that you please give [place] 2 business days to thoroughly review all your results before contacting the office for clarification. Should we see a critical lab result, you will be contacted sooner.   If You Need Anything After Your Visit  If you have any questions or concerns for your doctor, please call our main line at [phone] If no one answers, please leave a voicemail as directed and we will return your call as soon as possible. Messages left after 4 pm will be answered the following business day.   You may also send [place] a message via MyChart. We typically respond to MyChart messages within 1-2 business days.  For prescription refills, please ask your pharmacy to contact our office. Our fax number is [phone].  If you have an urgent issue when the clinic is closed that cannot wait until the next business day, you can page your doctor at the number below.    Please note that while we do our best to be available for urgent issues outside of office hours, we are not available 24/7.   If you have an urgent issue and are unable to reach [place], you may choose to seek medical care at your doctor's office, retail clinic, urgent care center, or emergency room.  If you have a medical emergency, please immediately call 911 or go to the emergency department. In the event of inclement weather, please call our main line at [phone] for an update on the status of  any delays or closures.  Dermatology Medication Tips: Please keep the boxes that topical medications come in in order to help keep track of the instructions about where and how to use these. Pharmacies typically print the medication instructions only on the boxes and not directly on the medication tubes.   If your medication is too expensive, please contact our office at [phone] or send [place] a message through MyChart.   We are unable to tell what  your co-pay for medications will be in advance as this is different depending on your insurance coverage. However, we may be able to find a substitute medication at lower cost or fill out paperwork to get insurance to cover a needed medication.   If a prior authorization is required to get your medication covered by your insurance company, please allow [place] 1-2 business days to complete this process.  Drug prices often vary depending on where the prescription is filled and some pharmacies may offer cheaper prices.  The website www.goodrx.com contains coupons for medications through different pharmacies. The prices here do not account for what the cost may be with help from insurance (it may be cheaper with your insurance), but the website can give you the price if you did not use any insurance.  - You can print the associated coupon and take it with your prescription to the pharmacy.  - You may also stop by our office during regular business hours and pick up a GoodRx coupon card.  - If you need your prescription sent electronically to a different pharmacy, notify our office through [facility] or by phone at [phone]

## 2023-09-08 NOTE — Progress Notes (Signed)
   Follow-Up Visit   Subjective  Caroline [name] is a 87 y.o. female who presents for the following: Intertrigo   Patient present today for follow up visit for intertrigo. Patient was last evaluated on 04/26/23. Was Rx Nystatin - Triamcinolone.  Patient reports sxs are moderately better. Patient denies medication changes.  The following portions of the chart were reviewed this encounter and updated as appropriate: medications, allergies, medical history  Review of Systems:  No other skin or systemic complaints except as noted in HPI or Assessment and Plan.  Objective  Well appearing patient in no apparent distress; mood and affect are within normal limits.   A focused examination was performed of the following areas: Intertrigo   Relevant exam findings are noted in the Assessment and Plan.  Left Temple (2), Right Shoulder - Anterior, Right Temple (4) Pink gritty papules    Assessment & Plan   INTERTRIGO Exam Erythematous macerated patches  Improved  Intertrigo is a chronic recurrent rash that occurs in skin fold areas that may be associated with friction; heat; moisture; yeast; fungus; and bacteria.  It is exacerbated by increased movement / activity; sweating; and higher atmospheric temperature.  Treatment Plan - Use otc ZeabsorbAF under the breast to prevent reoccurrence.  - D/C Nystatin-Triamcinolone cream - Will Rx Econazole Nitrate 1% Cream - use on affected areas under breast BID until clear when flared.    LENTIGINES Exam: scattered tan macules Due to sun exposure Treatment Plan: Benign-appearing, observe. Recommend daily broad spectrum sunscreen SPF 30+ to sun-exposed areas, reapply every 2 hours as needed.  Call for any changes   ACTINIC DAMAGE - chronic, secondary to cumulative UV radiation exposure/sun exposure over time - diffuse scaly erythematous macules with underlying dyspigmentation - Recommend daily broad spectrum sunscreen SPF 30+ to sun-exposed areas,  reapply every 2 hours as needed.  - Recommend staying in the shade or wearing long sleeves, sun glasses (UVA+UVB protection) and wide brim hats (4-inch brim around the entire circumference of the hat). - Call for new or changing lesions.    AK (actinic keratosis) (7) Right Shoulder - Anterior; Left Temple (2); Right Temple (4)  Destruction of lesion - Right Shoulder - Anterior Complexity: simple   Destruction method: cryotherapy   Informed consent: discussed and consent obtained   Timeout:  patient name, date of birth, surgical site, and procedure verified Lesion destroyed using liquid nitrogen: Yes   Region frozen until ice ball extended beyond lesion: Yes   Outcome: patient tolerated procedure well with no complications   Post-procedure details: wound care instructions given    Erythema intertrigo  Related Medications econazole nitrate 1 % cream Apply topically daily. Use on affected areas under breast twice a day until clear when flared.  Actinic skin damage  Lentigines    No follow-ups on file.    Documentation: I have reviewed the above documentation for accuracy and completeness, and I agree with the above.   I, Shirron Marcha Solders, CMA, am acting as scribe for [employer], DO.   Langston Reusing, DO

## 2023-09-12 ENCOUNTER — Other Ambulatory Visit: Payer: Self-pay

## 2023-09-12 MED ORDER — KETOCONAZOLE 2 % EX CREA: 1.0000 | TOPICAL_CREAM | Freq: Two times a day (BID) | CUTANEOUS | 1 refills | Status: AC

## 2023-09-12 NOTE — Progress Notes (Signed)
Switched med per dr. Onalee Hua as econazole not covered

## 2023-10-04 ENCOUNTER — Other Ambulatory Visit: Payer: Medicare HMO

## 2023-10-04 DIAGNOSIS — E785 Hyperlipidemia, unspecified: Secondary | ICD-10-CM | POA: Diagnosis not present

## 2023-10-04 DIAGNOSIS — Z131 Encounter for screening for diabetes mellitus: Secondary | ICD-10-CM | POA: Diagnosis not present

## 2023-10-04 DIAGNOSIS — I10 Essential (primary) hypertension: Secondary | ICD-10-CM

## 2023-10-05 LAB — COMPREHENSIVE METABOLIC PANEL
ALT: 18 [IU]/L (ref 0–32)
AST: 24 [IU]/L (ref 0–40)
Albumin: 4.4 g/dL (ref 3.7–4.7)
Alkaline Phosphatase: 117 [IU]/L (ref 44–121)
BUN/Creatinine Ratio: 16 (ref 12–28)
BUN: 13 mg/dL (ref 8–27)
Bilirubin Total: 0.3 mg/dL (ref 0.0–1.2)
CO2: 24 mmol/L (ref 20–29)
Calcium: 9.7 mg/dL (ref 8.7–10.3)
Chloride: 101 mmol/L (ref 96–106)
Creatinine, Ser: 0.81 mg/dL (ref 0.57–1.00)
Globulin, Total: 2.5 g/dL (ref 1.5–4.5)
Glucose: 84 mg/dL (ref 70–99)
Potassium: 4.5 mmol/L (ref 3.5–5.2)
Sodium: 140 mmol/L (ref 134–144)
Total Protein: 6.9 g/dL (ref 6.0–8.5)
eGFR: 71 mL/min/{1.73_m2} (ref >59)

## 2023-10-05 LAB — CBC WITH DIFFERENTIAL/PLATELET
Basophils Absolute: 0.1 10*3/uL (ref 0.0–0.2)
Basos: 1 %
EOS (ABSOLUTE): 0.3 10*3/uL (ref 0.0–0.4)
Eos: 4 %
Hematocrit: 44.8 % (ref 34.0–46.6)
Hemoglobin: 14.7 g/dL (ref 11.1–15.9)
Immature Grans (Abs): 0 10*3/uL (ref 0.0–0.1)
Immature Granulocytes: 0 %
Lymphocytes Absolute: 1.6 10*3/uL (ref 0.7–3.1)
Lymphs: 20 %
MCH: 32.2 pg (ref 26.6–33.0)
MCHC: 32.8 g/dL (ref 31.5–35.7)
MCV: 98 fL — ABNORMAL HIGH (ref 79–97)
Monocytes Absolute: 0.5 10*3/uL (ref 0.1–0.9)
Monocytes: 7 %
Neutrophils Absolute: 5.6 10*3/uL (ref 1.4–7.0)
Neutrophils: 68 %
Platelets: 267 10*3/uL (ref 150–450)
RBC: 4.57 x10E6/uL (ref 3.77–5.28)
RDW: 11.9 % (ref 11.7–15.4)
WBC: 8.2 10*3/uL (ref 3.4–10.8)

## 2023-10-05 LAB — HEMOGLOBIN A1C
Est. average glucose Bld gHb Est-mCnc: 114 mg/dL
Hgb A1c MFr Bld: 5.6 % (ref 4.8–5.6)

## 2023-10-05 LAB — LIPID PANEL
Chol/HDL Ratio: 2.3 {ratio} (ref 0.0–4.4)
Cholesterol, Total: 145 mg/dL (ref 100–199)
HDL: 64 mg/dL (ref >39)
LDL Chol Calc (NIH): 62 mg/dL (ref 0–99)
Triglycerides: 105 mg/dL (ref 0–149)
VLDL Cholesterol Cal: 19 mg/dL (ref 5–40)

## 2023-10-06 ENCOUNTER — Other Ambulatory Visit: Payer: Self-pay | Admitting: Family Medicine

## 2023-10-06 DIAGNOSIS — K219 Gastro-esophageal reflux disease without esophagitis: Secondary | ICD-10-CM

## 2023-10-10 ENCOUNTER — Ambulatory Visit (INDEPENDENT_AMBULATORY_CARE_PROVIDER_SITE_OTHER): Payer: Medicare (Managed Care) | Admitting: Family Medicine

## 2023-10-10 ENCOUNTER — Encounter: Payer: Self-pay | Admitting: Family Medicine

## 2023-10-10 VITALS — BP 138/71 | HR 73 | Temp 97.9°F | Ht 63.0 in | Wt 151.8 lb

## 2023-10-10 DIAGNOSIS — I824Z9 Acute embolism and thrombosis of unspecified deep veins of unspecified distal lower extremity: Secondary | ICD-10-CM

## 2023-10-10 DIAGNOSIS — I10 Essential (primary) hypertension: Secondary | ICD-10-CM

## 2023-10-10 DIAGNOSIS — E785 Hyperlipidemia, unspecified: Secondary | ICD-10-CM | POA: Diagnosis not present

## 2023-10-10 DIAGNOSIS — N390 Urinary tract infection, site not specified: Secondary | ICD-10-CM

## 2023-10-10 NOTE — Assessment & Plan Note (Signed)
 Last lipid panel: LDL 61, HDL 82, triglycerides 93.  Hepatic function within normal limits.  Continue atorvastatin 80 mg daily.  Will continue to monitor.

## 2023-10-10 NOTE — Assessment & Plan Note (Addendum)
 Followed annually by Dr. MacDiarmid at [facility]. Husband brought a urine sample and would like it tested today.  Patient is currently asymptomatic, taking trimethoprim  100 mg daily as prophylactic therapy for recurrent UTIs.  Consulted with Dr. Chandra as well, he is in agreement that as long as she is asymptomatic there is no medical necessity to perform urinalysis. Discussed with patient and husband asymptomatic bacteriuria versus UTI: asymptomatic bacteriuria is more bacteria than normal in the urine without symptoms while UTI is bacteria in the urine causing symptoms such as cystitis or altered mental status.  Given that she has asymptomatic bacteriuria, urinalysis or culture will always be positive for bacteria present.  Because of this,discussed that as long as she is asymptomatic, there is no medical indication for primary care to screen for asymptomatic bacteriuria with urinalysis to look for changes.  The purpose of prophylactic trimethoprim  as prescribed by urology is to prevent UTIs (which are symptomatic).  Given that she has asymptomatic bacteriuria, screening urinalysis or culture will more likely than not be positive which is why there is no medical need to screen.  As long as she remains asymptomatic, this means that the prophylactic trimethoprim  is effective in preventing recurrent UTIs.  Patient's husband was upset in learning this and does not understand why we will not run a urinalysis in the primary care office. Validated feelings of frustration, patient stated it is beyond frustration. Stated, since when did doctors here stop performing medicine? PCP began to further explain the distinction and reasoning for there being no medical necessity, husband stood up in the middle of the discussion and stated that they were done.  He verbalized while leaving the room that if PCP did not practice medicine there will be repercussions. Encouraged him to follow-up with urology as they are  managing asymptomatic bacteriuria, recurrent UTI, and prophylactic medication. If patient develops symptoms, particularly AMS as that is her typical presentation of acute cystitis, we can run a UA in PCP office. Will also send a fax to Dr. Gaston with the update and to ask him for clarification on primary care's role versus urology's role in management.  From primary care standpoint, there is no reason to perform urinalysis or culture as long as patient is asymptomatic.  Reviewed office notes from most recent urology appointment 03/02/2023.  Noted that when she gets bladder infections this is typically without cystitis symptoms, manifests more so with confusion. Dr. MacDiarmid recommended daily trimethoprim  100 mg as prophylaxis for recurrent UTIs, timed voiding, and fluid modifications.  At that point, had been clinically infection free on prophylactic trimethoprim .  Recommendation to continue trimethoprim  100 mg daily and follow-up with urology in 1 year, agree with this recommendation.  Update 10/11/2023 after communicating with Dr. Gaston Dr. Gaston confirmed that from a medical standpoint, PCP does not need to conduct routine UA unless there are new symptoms or concerns.

## 2023-10-10 NOTE — Assessment & Plan Note (Addendum)
 BP goal <130/80. Stable.  Continue irbesartan 75 mg daily.  CMP within normal limits.  Will continue to monitor.

## 2023-10-10 NOTE — Assessment & Plan Note (Signed)
 Currently taking Eliquis 5 mg daily.  Neurologist previously agreed that dabigatrin would be an appropriate alternative.  In the meantime, continue Eliquis 5 mg daily.

## 2023-10-10 NOTE — Progress Notes (Addendum)
 Established Patient Office Visit  Subjective   Patient ID: Caroline [name], female    DOB: [birth year]  Age: 88 y.o. MRN: [id number]  Chief Complaint  Patient presents with   Hypertension    HPI Caroline [name] is a 88 y.o. female presenting today with her husband for follow up of hypertension, hyperlipidemia, DVT.  Husband brought a urine sample and would like it tested today.  Patient is currently asymptomatic, taking trimethoprim  100 mg daily as prophylactic therapy for recurrent UTIs.  Followed annually by Dr. MacDiarmid at [facility].  Most recent appointment 03/02/2023.  Noted that when she gets bladder infections this is typically without cystitis symptoms, manifests more so with confusion. Dr. MacDiarmid recommended daily trimethoprim  100 mg as prophylaxis for recurrent UTIs, timed voiding, and fluid modifications.  At that point, had been clinically infection free on prophylactic trimethoprim .  Recommendation to continue trimethoprim  100 mg daily and follow-up with urology in 1 year. Hypertension: Patient here for follow-up of elevated blood pressure. Pt denies chest pain, SOB, dizziness, edema, syncope, fatigue or heart palpitations. Taking irbesartan , reports excellent compliance with treatment. Denies side effects. Hyperlipidemia: tolerating atorvastatin  well with no myalgias or significant side effects. The ASCVD Risk score (Arnett DK, et al., 2019) failed to calculate for the following reasons:   The 2019 ASCVD risk score is only valid for ages 53 to 14   Risk score cannot be calculated because patient has a medical history suggesting prior/existing ASCVD DVT: After last appointment, consulted with neurologist and changed Eliquis  to generic dabigatrin due to cost.  When at the pharmacy to pick up, found that there was no significant difference in price so decided to continue with Eliquis .  Outpatient Medications Prior to Visit  Medication Sig   acetaminophen  (TYLENOL ) 325 MG tablet  Take 2 tablets (650 mg total) by mouth every 6 (six) hours as needed for mild pain, moderate pain, fever or headache.   Ascorbic Acid (VITAMIN C ) 1000 MG tablet Take 1,000 mg by mouth daily.   atorvastatin  (LIPITOR ) 80 MG tablet TAKE 1 TABLET (80 MG TOTAL) BY MOUTH AT BEDTIME.   calcium  carbonate ([employer] CALCIUM  CARBONATE) 1250 (500 Ca) MG tablet Take 2 tablets (2,500 mg total) by mouth daily.   Catheters MISC Use as directed. 1 box of inserts and 1 box canisters.   cholecalciferol  (VITAMIN D3) 25 MCG (1000 UNIT) tablet Take 0.5 tablets (500 Units total) by mouth daily.   donepezil  (ARICEPT ) 10 MG tablet Take 1 tablet (10 mg total) by mouth daily.   econazole nitrate  1 % cream Apply topically daily. Use on affected areas under breast twice a day until clear when flared.   ELIQUIS  5 MG TABS tablet TAKE 1 TABLET BY MOUTH 2 TIMES DAILY.   furosemide  (LASIX ) 20 MG tablet Take 1 tablet (20 mg total) by mouth daily as needed for edema.   irbesartan  (AVAPRO ) 75 MG tablet TAKE 1 TABLET (75 MG TOTAL) BY MOUTH DAILY.   ketoconazole  (NIZORAL ) 2 % cream Apply 1 Application topically 2 (two) times daily.   levothyroxine  (SYNTHROID ) 125 MCG tablet TAKE 1 TABLET (125 MCG TOTAL) BY MOUTH DAILY AT 6 AM.   meclizine  (ANTIVERT ) 25 MG tablet Take 0.5 tablets (12.5 mg total) by mouth 3 (three) times daily as needed for dizziness.   memantine  (NAMENDA ) 5 MG tablet Take 1 tablet (5 mg total) by mouth 2 (two) times daily.   ondansetron  (ZOFRAN ) 4 MG tablet Take 1 tablet (4 mg total) by mouth every 6 (  six) hours.   pantoprazole  (PROTONIX ) 40 MG tablet TAKE 1 TABLET BY MOUTH DAILY   QUEtiapine  (SEROQUEL ) 25 MG tablet TAKE 1 TABLET (25 MG TOTAL) BY MOUTH AT BEDTIME.   vitamin B-12 (CYANOCOBALAMIN ) 250 MCG tablet Take 1 tablet (250 mcg total) by mouth daily.   No facility-administered medications prior to visit.    ROS Negative unless otherwise noted in HPI   Objective:     BP 138/71   Pulse 73   Temp 97.9 F (36.6  C) (Oral)   Ht 5' 3 (1.6 m)   Wt 151 lb 12 oz (68.8 kg)   BMI 26.88 kg/m   Physical Exam Constitutional:      General: She is not in acute distress.    Appearance: Normal appearance.  HENT:     Head: Normocephalic and atraumatic.  Cardiovascular:     Rate and Rhythm: Normal rate and regular rhythm.     Heart sounds: No murmur heard.    No friction rub. No gallop.  Pulmonary:     Effort: Pulmonary effort is normal. No respiratory distress.     Breath sounds: No wheezing, rhonchi or rales.  Skin:    General: Skin is warm and dry.  Neurological:     Mental Status: She is alert and oriented to person, place, and time.     Assessment & Plan:  Essential hypertension Assessment & Plan: BP goal <130/80. Stable.  Continue irbesartan  75 mg daily.  CMP within normal limits.  Will continue to monitor.   Hyperlipidemia, unspecified hyperlipidemia type Assessment & Plan: Last lipid panel: LDL 61, HDL 82, triglycerides 93.  Hepatic function within normal limits.  Continue atorvastatin  80 mg daily.  Will continue to monitor.   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  Neurologist previously agreed that dabigatrin would be an appropriate alternative.  In the meantime, continue Eliquis  5 mg daily.   Frequent UTI Assessment & Plan: Followed annually by Dr. Gaston at [facility]. Husband brought a urine sample and would like it tested today.  Patient is currently asymptomatic, taking trimethoprim  100 mg daily as prophylactic therapy for recurrent UTIs.  Consulted with Dr. Chandra as well, he is in agreement that as long as she is asymptomatic there is no medical necessity to perform urinalysis. Discussed with patient and husband asymptomatic bacteriuria versus UTI: asymptomatic bacteriuria is more bacteria than normal in the urine without symptoms while UTI  is bacteria in the urine causing symptoms such as cystitis or altered mental status.  Given that she has asymptomatic bacteriuria, urinalysis or culture will always be positive for bacteria present.  Because of this,discussed that as long as she is asymptomatic, there is no medical indication for primary care to screen for asymptomatic bacteriuria with urinalysis to look for changes.  The purpose of prophylactic trimethoprim  as prescribed by urology is to prevent UTIs (which are symptomatic).  Given that she has asymptomatic bacteriuria, screening urinalysis or culture will more likely than not be positive which is why there is no medical need to screen.  As long as she remains asymptomatic, this means that the prophylactic trimethoprim  is effective in preventing recurrent UTIs.  Patient's husband was upset in learning this and does not understand why we will not run a urinalysis in the primary care office. Validated feelings of frustration, patient stated it is beyond frustration. Stated, since when did doctors here stop  performing medicine? PCP began to further explain the distinction and reasoning for there being no medical necessity, husband stood up in the middle of the discussion and stated that they were done.  He verbalized while leaving the room that if PCP did not practice medicine there will be repercussions. Encouraged him to follow-up with urology as they are managing asymptomatic bacteriuria, recurrent UTI, and prophylactic medication. If patient develops symptoms, particularly AMS as that is her typical presentation of acute cystitis, we can run a UA in PCP office. Will also send a fax to Dr. Gaston with the update and to ask him for clarification on primary care's role versus urology's role in management.  From primary care standpoint, there is no reason to perform urinalysis or culture as long as patient is asymptomatic.  Reviewed office notes from most recent urology appointment  03/02/2023.  Noted that when she gets bladder infections this is typically without cystitis symptoms, manifests more so with confusion. Dr. MacDiarmid recommended daily trimethoprim  100 mg as prophylaxis for recurrent UTIs, timed voiding, and fluid modifications.  At that point, had been clinically infection free on prophylactic trimethoprim .  Recommendation to continue trimethoprim  100 mg daily and follow-up with urology in 1 year, agree with this recommendation.  Update 10/11/2023 after communicating with Dr. Gaston Dr. Gaston confirmed that from a medical standpoint, PCP does not need to conduct routine UA unless there are new symptoms or concerns.    CBC, CMP, A1c, lipid panel within normal limits.  Return in about 6 months (around 04/08/2024) for follow-up for HTN, HLD, fasting blood work 1 week before.    Joesph DELENA Sear, PA

## 2023-10-11 ENCOUNTER — Encounter: Payer: Self-pay | Admitting: Family Medicine

## 2023-10-11 NOTE — Progress Notes (Signed)
 Communicated with patient's urologist clarifying PCP will and need for routine urinalysis in our office. See below.  Letter faxed 10/10/2023 Dear Dr. Gaston:   I saw our mutual patient Caroline [name] and her husband in my office today.  I am reaching out to you to clarify what the role of primary care should be in managing her asymptomatic bacteriuria and recurrent UTIs.  My understanding after reviewing your notes is that she is currently taking trimethoprim  100 mg daily as prophylaxis to prevent UTIs in which her symptoms typically consist of AMS rather than urinary symptoms.  It appears that this prescription is currently being managed by your office.   Given that she is clinically asymptomatic, from a primary care perspective there is no medical need to perform screening urinalysis or urine culture on a routine basis.  The patient's husband was adamant that we perform routine urinalysis even when she is asymptomatic and was very upset today that it was not a medically indicated service.     Please let me know if I should be performing urinalysis more frequently, or if you are overseeing monitoring and management of asymptomatic bacteriuria and recurrent UTIs for this patient.  I would like clarification on the role of her PCP and if there is a medical indication for repeat urinalysis in the absence of symptoms that I am not aware of.           Thank you,   Caroline DELENA Sear PA  Response and messages in Epic 10/11/2023 From: Gaston Hamilton, MD Sent: 10/11/2023  11:57 AM EST To: Caroline DELENA Sear, PA  I do not mind following the patient for this.  Of course I get lots of urine test with cultures.  Balancing the wishes of the patient as you know are always difficult but proceed as you wish.  From a medical standpoint I think it is okay that you do not do them unless you are concerned about symptoms or trying to address family concerns  ===View-only below this line=== ----- Message  -----  Thank you for the clarification!   I discussed the matter with my supervising physician as well, he was in agreement with only needing to perform UA if there is medical necessity based on her symptoms. I am glad to hear you are otherwise following her and performing any necessary routine urine tests. We will reiterate this to the patient, it may be helpful to clarify that the next time you see them as well!  Thank you, I appreciate it! Caroline

## 2023-10-13 ENCOUNTER — Encounter: Payer: Self-pay | Admitting: Family Medicine

## 2023-10-31 ENCOUNTER — Encounter: Payer: Self-pay | Admitting: Podiatry

## 2023-10-31 ENCOUNTER — Ambulatory Visit: Payer: Medicare (Managed Care) | Admitting: Podiatry

## 2023-10-31 DIAGNOSIS — B351 Tinea unguium: Secondary | ICD-10-CM

## 2023-10-31 DIAGNOSIS — M79674 Pain in right toe(s): Secondary | ICD-10-CM | POA: Diagnosis not present

## 2023-10-31 DIAGNOSIS — D689 Coagulation defect, unspecified: Secondary | ICD-10-CM

## 2023-10-31 DIAGNOSIS — M79675 Pain in left toe(s): Secondary | ICD-10-CM | POA: Diagnosis not present

## 2023-10-31 DIAGNOSIS — L03039 Cellulitis of unspecified toe: Secondary | ICD-10-CM | POA: Insufficient documentation

## 2023-10-31 NOTE — Progress Notes (Signed)
This patient returns to my office for at risk foot care.  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  This patient states she is having pain in the outside border right big toe.  The toe is painful walking and wearing her shoes.  She presents to the office with her husband.  This patient presents for at risk foot care today.  General Appearance  Alert, conversant and in no acute stress.  Vascular  Dorsalis pedis and posterior tibial  pulses are  weakly palpable  bilaterally.  Capillary return is within normal limits  bilaterally. Temperature is within normal limits  bilaterally.  Neurologic  Senn-Weinstein monofilament wire test within normal limits  bilaterally. Muscle power within normal limits bilaterally.  Nails Thick disfigured discolored nails with subungual debris  hallux  nails bilaterally. No evidence of bacterial infection or drainage bilaterally.Paronychia lateral border right hallux with skin desquamation.  Orthopedic  No limitations of motion  feet .  No crepitus or effusions noted.  No bony pathology or digital deformities noted.  Skin  normotropic skin with no porokeratosis noted bilaterally.  No signs of infections or ulcers noted.     Paronychia right great toenail.  Pain in right toes  Pain in left toes  Consent was obtained for treatment procedures.   Mechanical debridement of nails 1-5  bilaterally performed with a nail nipper.  Filed with dremel without incident. Cleaned lateral border right hallux and no pus is noted.   Return office visit    10 weeks.                 Told patient to return for periodic foot care and evaluation due to potential at risk complications.   Helane Gunther DPM

## 2023-11-01 ENCOUNTER — Other Ambulatory Visit: Payer: Self-pay | Admitting: Family Medicine

## 2023-11-01 DIAGNOSIS — I82412 Acute embolism and thrombosis of left femoral vein: Secondary | ICD-10-CM

## 2023-11-01 DIAGNOSIS — E039 Hypothyroidism, unspecified: Secondary | ICD-10-CM

## 2023-11-10 ENCOUNTER — Encounter: Payer: Self-pay | Admitting: Family Medicine

## 2023-11-24 ENCOUNTER — Other Ambulatory Visit: Payer: Self-pay | Admitting: Family Medicine

## 2023-11-24 DIAGNOSIS — I82412 Acute embolism and thrombosis of left femoral vein: Secondary | ICD-10-CM

## 2023-11-25 ENCOUNTER — Other Ambulatory Visit: Payer: Self-pay | Admitting: Family Medicine

## 2023-12-01 ENCOUNTER — Other Ambulatory Visit: Payer: Self-pay | Admitting: Family Medicine

## 2023-12-01 DIAGNOSIS — I10 Essential (primary) hypertension: Secondary | ICD-10-CM

## 2023-12-08 DIAGNOSIS — G894 Chronic pain syndrome: Secondary | ICD-10-CM | POA: Diagnosis not present

## 2024-01-04 NOTE — Progress Notes (Signed)
 [facility] [address] [place]. [place] [address] [phone]       OFFICE FOLLOW UP VISIT NOTE  Ms. Aviva Wolfer Date of Birth:  12/29/1935 Medical Record Number:  [id number]   Primary neurologist: Dr. [name] Reason for visit: hx of stroke, cognitive impairment  Chief Complaint  Patient presents with   Cerebrovascular Accident    Rm 8  with spouse  Pt is well from a stroke standpoint. Spouse reports she has been having hallucinations but does have  UTI. No other cognitive concerns.      HPI:  Update 01/09/2024 JM: Patient returns for sooner scheduled visit due to recent increased confusion, visual hallucinations and night terrors.  Was seen by Dr. Lenord Fellers 4/4 and dx'd with UTI, on Cipro, husband reports improvement of symptoms since treatment.  He is frustrated that he was previously seen by PCP in January and requested complaining of UA on urine sample provided but he reports PCP "refused" to complete., reviewed PCP note from 10/10/2023, UA not completed as patient asymptomatic and remained on prophylactic trimothoprim as advised by [facility] therefore no indication to screen for asymptomatic bacteriuria. Typical UTI symptoms are change in mental status, typically does not have dysuria or hematuria. She is scheduled for yearly urology follow-up in May but husband plans on seeing if sooner visit can be scheduled as they are leaving for a cruise at the end of this month.  Prior to UTI onset, he reports memory overall stable.  MMSE today 17/30 (prior 20/30).  Can have occasional sundowning behaviors but he is unsure if this was related to UTI.  Remains on Aricept and Namenda.  Continues to ambulate with a RW, denies any recent falls. Continued post stroke right hemianopia, unchanged, no new stroke/TIA symptoms.  Remains on Eliquis and atorvastatin, no side effects.  Routinely follows with PCP for stroke risk factor management.     History provided for reference purposes  only Update 08/10/2023 JM: Patient returns for follow-up visit accompanied by her husband who provides majority of history. At prior visit with Amy, NP, recommended continuation of Aricept and initiated memantine.   Reports cognition has been stable since prior visit. Continues to struggle more with short term memory but this is relatively unchanged since prior visit.  Continues on donepezil 10 mg daily and memantine 5 mg twice daily, denies side effects. Prior side effects with increase of chronic diarrhea but this has since improved and only needs Imodium occasionally. Can become frustrated at times, usually due to disagreements with husband.  No significant behavioral concerns. Continues on quetiapine 25 mg nightly managed by PCP. Reports good appetite. Use of OTC sleep aide with benefit. Husband ensure she stays active during the day. Use of RW, no recent falls.  Husband assist with majority of ADLs. Denies new stroke/TIA symptoms.  Right peripheral vision loss unchanged.  Previously followed by Dr. Nile Riggs ophthalmology but per husband, office recently closed and needs to find new ophthalmologist.  He would be interested in being evaluated by neuro-ophthalmology.  Remains on Eliquis and atorvastatin.  Was previously looking at transitioning off Eliquis to Pradaxa due to cost but no significant cost different on Pradaxa therefore continued on Eliquis.  Routinely follows with PCP for stroke risk factor management.  Update 01/25/23 [facility]: Sirenia Whitis is a 88 y.o. 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 She was last seen by Shanda Bumps 07/2022. MMSE 20/30. She reported difficulty tolerating donepezil  d/t sleepiness and chronic diarrhea. She was switched to Aldarity which was not covered. She was started on rivastigmine. Side effects of insomnia and worsening confusion after first dose reported. It took three days to return to baseline. Aldarity offered but we  did not hear back from family. They have continued donepezil 10mg  daily after breakfast. She is also taking quetiapine 25mg  QHS followed by PCP.    She presents with her husband, [name], who aids in history. She seems to be tolerating donepezil taken with breakfast. Mr Erby has noticed more sleepiness about 2 hours after taking donepezil. He feels that short term memory may be a little worse but overall she seems fairly stable. Some days are better than others. She needs assistance with most ADLs. She uses her walker but likes to lean forward and walk behind walker. No falls. Diarrhea seems to be a little better. Certain foods and antibiotic therapy seem to make it worse. Imodium PRN helps. She does have difficulty with delusions. She feels that her children are still younger, school aged. They are grown adults. She asks repetitively to go home even though she has lived her her current home for 50 years. No visual or auditory hallucinations. She is finishing treatment for UTI. She is sleeping well. She continues to have vivid dreams but doesn't seem overly bothered by these. No longer using sleep aids.    She continues Eliquis and atorvastatin for stroke prevention. No known TIA/stroke symptoms since last visit. She is followed closely by PCP.   Update 06/30/2022 JM: Patient returns for follow-up visit after prior visit 4 months ago accompanied by her husband.  Overall stable from stroke standpoint without new stroke/TIA symptoms.  Residual visual impairment, stable since prior visit. Also mentions continued cognitive impairment more so in regards to short-term memory difficulties, at times will not recognize her husband or believes her husband is her mother, at times have confusions and nonthreatening hallucinations but has also been dealing with recurrent UTIs and has been unsure if these have been contributing.  At times can be agitated or irritable but no violent behavior or any other behavioral concerns.   He believes overall her cognition has been stable without any significant worsening since prior visit.  She has remained on Aricept 10 mg daily, was initially taking at night but was having night terrors and talking in her sleep therefore he switched to daytime but can cause some drowsiness.  She is also been having ongoing issues with diarrhea.  Compliant on Eliquis and atorvastatin, denies side effects.  Blood pressure typically well controlled.  Closely follows with PCP.  No further concerns at this time.  Update 02/04/2022 Dr. [name]: She returns for follow-up after last visit 2 months ago.  She is accompanied by husband and states that she has good days and bad days.  She was from gets disoriented and confused in time she does not recognize even her husband and family members.  She is often talking to people who are dead.  I had started the patient on Namenda which she stopped it a month ago as her husband did not notice any benefit.  She is he has also not noticed that she is any worse after stopping the medicine.  Patient is UTI seem to be much better since she has been started on Macrodantin 50 mg which she takes every other day.  She does get occasionally agitated and irritable but there is no violent behavior.  She does not wander off.  She does sleep quite well and eats well.  She has not tried Aricept in the past and is willing to try it.  On Mini-Mental status exam today she scored 16/30.  Clock drawing 3/4.  Update 12/09/2021 Dr. [name]: She returns for follow-up after last visit 4 months ago.  She is accompanied by her husband.  Patient continues to have short-term memory difficulties as well as at times difficulty finding her way around her own home.  She gets disoriented and confused off-and-on.  He has had recurrent bouts of UTIs which have been treated by her primary care physician is currently on Macrobid.  Se had an episode of severe dizziness on 12/04/2021 and was taken to the emergency room by  EMS blood pressure was found to be significantly elevated.  An MRI scan of the brain was obtained which I personally reviewed did not show any acute abnormality and showed old left PCA infarct.  She continues to ambulate with a walker and history of pulmonary no falls injuries.  She is tolerating Eliquis well without bleeding or bruising..  Lab work on 10/22/2021 showed LDL cholesterol to be near optimal at 73 hemoglobin A1c at 5.7.  Initial visit 08/05/2021 Dr. [name]: Ms. Havel is a 88 year old pleasant Caucasian lady seen for initial office consultation visit for stroke.  She is accompanied by her daughter and husband.  History is obtained from them and review of electronic medical records and I personally reviewed pertinent available imaging films in PACS.  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  She presented on 06/19/2021 on postoperative day 1 for confusion, delirium and lethargy.  This was initially felt to be blood pressure and he was taken to the hospital by EMS.  He had an MRI scan of the brain personally reviewed.  Shows old left cerebellar infarct.  Continues to have peripheral vision loss, stroke improved.  Continues to walk with a walker, but no falls or injuries.  She is tolerating well without bruising or bleeding.  Blood pressure is under good control.  Tolerating Lipitor without muscle aches and.  .  She was also felt to have underlying low-grade dementia which was exacerbated by the surgery.  Her symptoms continued for couple of days and noncontrast CT scan of the head was obtained which showed acute left posterior cerebral artery stroke.  Her NIH was 4 on admission she is not a candidate for tPA due to recent surgery.  MRI scan was subsequently obtained which showed a left posterior cerebral artery infarct and MRA  showed left P2 occlusion.  2D echo showed ejection fraction of 60 to 65% without cardiac source of embolism.  LDL cholesterol was elevated at 123 mg percent and hemoglobin A1c was 5.5.  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  Patient continues to have right-sided peripheral vision loss.  She has been ambulating with a walker and has been careful.  She has had no falls or injuries.  The family has noticed worsening of her cognitive difficulties with patient having more frequent sundowning for which she has been started on Seroquel 25 mg at night which helps her sleep well.  There are  no delusions or hallucinations during the day.  She has more short-term memory difficulties and intermittent confusion which is new.  She does have 24-hour care at home and cannot be left alone.     ROS:   14 system review of systems is positive for those listed in HPI and all other systems negative  PMH:  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 Cheek   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   Bridge Left Nose, and Left Neck   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 Cheek Tx: curret x  3 and 5FU   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   Bridge of nose tx Cx3 and 5FU, Left neck Tx Cx3 and 5FU   Thrombophlebitis     Social History:  Social History   Socioeconomic History   Marital status: Married    Spouse name: [name]   Number of children: 3   Years of education: 12   Highest education level: Not on file  Occupational History   Occupation: retired  Tobacco Use   Smoking status: Never    Passive exposure: Never   Smokeless tobacco: Never  Vaping Use   Vaping status: Never Used  Substance and Sexual Activity   Alcohol use: No   Drug use: Not Currently   Sexual activity: Never  Other Topics Concern   Not on file  Social History Narrative   Lives with husband   Right Handed   Drinks no caffeine   Social Drivers of Corporate investment banker Strain: Low Risk  (09/06/2023)   Overall Financial Resource Strain (CARDIA)    Difficulty of Paying Living Expenses: Not hard at all  Food Insecurity: No Food Insecurity (09/06/2023)   Hunger Vital Sign    Worried About Running Out of Food in the Last Year: Never true    Ran Out of Food in the Last Year: Never true  Transportation Needs: No Transportation Needs (09/06/2023)   PRAPARE - Administrator, Civil Service (Medical): No    Lack of Transportation (Non-Medical): No  Physical Activity: Sufficiently Active (09/06/2023)   Exercise Vital Sign    Days of Exercise per Week: 3 days    Minutes of Exercise per Session: 60 min  Stress: No Stress Concern Present (09/06/2023)   [employer] of Occupational Health - Occupational Stress Questionnaire    Feeling of Stress : Not at all  Social Connections: Moderately Isolated (09/06/2023)   Social Connection and Isolation Panel [NHANES]    Frequency of Communication with Friends and Family: More than three times a week    Frequency of Social Gatherings with Friends and Family: Not on file    Attends Religious Services: Never    Active Member  of Clubs or Organizations: No    Attends Banker Meetings: Never    Marital Status: Married  Catering manager Violence: Not At Risk (09/06/2023)   Humiliation, Afraid, Rape, and Kick questionnaire    Fear of Current or Ex-Partner: No    Emotionally Abused: No    Physically Abused: No    Sexually Abused: No    Medications:   Current Outpatient Medications on File Prior to Visit  Medication Sig Dispense Refill   acetaminophen (TYLENOL) 325 MG tablet Take 2 tablets (650 mg total) by mouth every 6 (six) hours as needed for mild pain, moderate pain, fever or headache.     apixaban (ELIQUIS) 5 MG TABS tablet TAKE 1 TABLET BY MOUTH 2 TIMES DAILY. 180 tablet 1   Ascorbic Acid (VITAMIN C) 1000 MG tablet Take 1,000 mg by mouth daily.     atorvastatin (LIPITOR) 80 MG tablet TAKE 1 TABLET (80 MG TOTAL) BY MOUTH AT BEDTIME. 90 tablet 2   calcium carbonate ([employer] CALCIUM CARBONATE) 1250 (500 Ca) MG tablet Take 2 tablets (2,500 mg total) by mouth daily. 30 tablet 0   Catheters MISC Use as directed. 1 box of inserts and 1 box canisters. 1 each 3   cholecalciferol (VITAMIN D3) 25 MCG (1000 UNIT) tablet Take 0.5 tablets (500 Units total) by mouth daily.     ciprofloxacin (CIPRO) 250 MG tablet Take 1 tablet (250 mg total) by mouth 2 (two) times daily. 14 tablet 0   donepezil (ARICEPT) 10 MG tablet Take 1 tablet (10 mg total) by mouth daily. 90 tablet 3   econazole nitrate 1 % cream Apply topically daily. Use on affected areas under breast twice a day until clear when flared. 15 g 0   furosemide (LASIX) 20 MG tablet Take 1 tablet (20 mg total) by mouth daily as needed for edema. 90 tablet 0   irbesartan (AVAPRO) 75 MG tablet TAKE 1 TABLET (75 MG TOTAL) BY MOUTH DAILY. 90 tablet 1   levothyroxine (SYNTHROID) 125 MCG tablet TAKE 1 TABLET (125 MCG TOTAL) BY MOUTH DAILY AT 6 AM. 90 tablet 0   meclizine (ANTIVERT) 25 MG tablet Take 0.5 tablets (12.5 mg total) by mouth 3 (three) times daily as needed for  dizziness. 30 tablet 0   memantine (NAMENDA) 5 MG tablet Take 1 tablet (5 mg total) by mouth 2 (two) times daily. 180 tablet 3   ondansetron (ZOFRAN) 4 MG tablet Take 1 tablet (4 mg total) by mouth every 6 (six) hours. 12 tablet 0   pantoprazole (PROTONIX) 40 MG tablet TAKE 1 TABLET BY MOUTH DAILY 90 tablet 0   QUEtiapine (SEROQUEL) 25 MG tablet TAKE 1 TABLET (25 MG TOTAL) BY MOUTH AT BEDTIME. 90 tablet 1   traMADol (ULTRAM) 50 MG tablet Take 50 mg by mouth 2 (two) times daily as needed.     trimethoprim (TRIMPEX) 100 MG tablet Take 100 mg by mouth daily.     vitamin B-12 (CYANOCOBALAMIN) 250 MCG tablet Take 1 tablet (250 mcg total) by mouth daily. 30 tablet 0   No current facility-administered medications on file prior to visit.    Allergies:   Allergies  Allergen Reactions   Gluten Meal Nausea And Vomiting    Patient has CELIAC DISEASE   Wheat Nausea And Vomiting    Patient has CELIAC DISEASE   Adhesive [Tape] Other (See Comments)    Patient's skin is VERY THIN and tears very easily; please use either paper tape or Coban wrap  Codeine Nausea And Vomiting and Other (See Comments)    other   Other Other (See Comments)    Patient has Hypoglycemia; her husband stated "when her sugar crashes, it crashes hard."   Penicillins Other (See Comments)    Doesn't remember reaction , but denies any SOB/Swelling States " it was a long time ago "     Physical Exam Today's Vitals   01/09/24 0815  BP: (!) 121/59  Pulse: 77  Weight: 148 lb (67.1 kg)  Height: 5\' 3"  (1.6 m)    Body mass index is 26.22 kg/m.  General: Frail very pleasant elderly Caucasian lady, seated, in no evident distress  Neurologic Exam Mental Status: Awake and fully alert.  Fluent speech and language.  Follows commands without difficulty.  Recent and remote memory impaired. Attention span, concentration and fund of knowledge impaired. Mood and affect appropriate.  Cranial Nerves: Pupils equal, briskly reactive to  light. Extraocular movements full without nystagmus. Visual fields show dense right homonymous hemianopsia to confrontation. Hearing intact. Facial sensation intact. Face, tongue, palate moves normally and symmetrically.  Motor: Normal bulk and tone. Normal strength in all tested extremity muscles. Sensory.: intact to touch , pinprick , position and vibratory sensation.  Coordination: Rapid alternating movements normal in all extremities. Finger-to-nose and heel-to-shin performed accurately bilaterally. Gait and Station: Arises from chair without difficulty.  stance is stooped. . Gait demonstrates slight imbalance with use of rolling walker.  Not able to heel, toe and tandem walk  Reflexes: 1+ and symmetric. Toes downgoing.      01/09/2024    8:17 AM 08/10/2023   11:13 AM 01/25/2023    9:15 AM 06/30/2022    2:13 PM 02/04/2022   10:55 AM 11/24/2020   11:28 AM  MMSE - Mini Mental State Exam  Orientation to time 0 1 0 1 0 5  Orientation to Place 4 3 4 5 4 5   Registration 3 3 3 3 3 3   Attention/ Calculation 2 5 1 5 1 3   Recall 0 1 1 0 0 2  Language- name 2 objects 2 2 2 2 2 2   Language- repeat 1 1 1 1 1 1   Language- follow 3 step command 3 2 3 1 3 3   Language- read & follow direction 1 1 1 1 1 1   Write a sentence 1 1 1 1 1 1   Copy design 0 0 1 0 0 0  Total score 17 20 18 20 16 26          ASSESSMENT/PLAN: 88 year old Caucasian lady with left posterior cerebral artery infarction secondary to left P2 occlusion in 06/2021 with significant residual right-sided homonymous hemianopsia as well as poststroke vascular dementia.  Multiple vascular risk factors of hypertension , hyperlipidemia and age. Hx of DVT on Eliquis.      Left PCA stroke -Continue Eliquis (hx of DVT) and atorvastatin for secondary stroke prevention measures managed/prescribed by PCP  -Continue close PCP follow-up for aggressive stroke risk factor management including BP goal<130/90, and HLD with LDL goal<70  -Stroke labs  09/2023: LDL 62, A1c 5.6   Mild vascular dementia Age-related cognitive impairment -MMSE today 17/30 (prior 20/30) -Continue donepezil 10 mg daily and memantine 5 mg twice daily - can consider dosage increase of memantine in the future if needed but as memory stable, will continue on current dosage  -discussed memory compensation strategies and advised her to increase participation in cognitively challenging activities like solving crossword puzzles, playing bridge and sodoku  Recurrent UTIs -  Typical symptoms consist of altered mental status like causing recent hallucinations - Currently being treated for UTI, had visual hallucinations and night terrors which have been gradually improving since initiating Cipro last week -remains on prophylactic treatment and is routinely followed with [facility]     Follow-up in 6 months or call earlier if needed    I spent 30 minutes of face-to-face and non-face-to-face time with patient and husband.  This included previsit chart review, lab review, study review, order entry, electronic health record documentation, patient and husband education and discussion regarding above diagnoses and treatment plan and answered all other questions to patient and husband's satisfaction  Ihor Austin, [facility] Norman  [facility] [address] [place], [place] [address]  Phone [phone] Fax [phone] Note: This document was prepared with digital dictation and possible smart phrase technology. Any transcriptional errors that result from this process are unintentional.

## 2024-01-06 ENCOUNTER — Ambulatory Visit: Payer: Self-pay

## 2024-01-06 ENCOUNTER — Ambulatory Visit: Payer: Medicare (Managed Care) | Admitting: Internal Medicine

## 2024-01-06 VITALS — BP 118/68 | HR 74 | Temp 97.2°F | Resp 12 | Ht 63.0 in | Wt 154.0 lb

## 2024-01-06 DIAGNOSIS — R350 Frequency of micturition: Secondary | ICD-10-CM

## 2024-01-06 DIAGNOSIS — N390 Urinary tract infection, site not specified: Secondary | ICD-10-CM | POA: Diagnosis not present

## 2024-01-06 DIAGNOSIS — R300 Dysuria: Secondary | ICD-10-CM | POA: Diagnosis not present

## 2024-01-06 LAB — POCT URINALYSIS DIP (MANUAL ENTRY)
Bilirubin, UA: NEGATIVE
Glucose, UA: NEGATIVE mg/dL
Ketones, POC UA: NEGATIVE mg/dL
Nitrite, UA: POSITIVE — AB
Protein Ur, POC: NEGATIVE mg/dL
Spec Grav, UA: 1.015 (ref 1.010–1.025)
Urobilinogen, UA: 0.2 U/dL
pH, UA: 6 (ref 5.0–8.0)

## 2024-01-06 MED ORDER — CIPROFLOXACIN HCL 250 MG PO TABS: 250.0000 mg | ORAL_TABLET | Freq: Two times a day (BID) | ORAL | 0 refills | Status: DC

## 2024-01-06 NOTE — Progress Notes (Signed)
 Patient Care Team: Noreene Bearded, PA as PCP - General (Family Medicine) Terrilyn Fick, NP as Nurse Practitioner (Neurology) Erman Hayward, MD as Consulting Physician (Urology)  Visit Date: 01/06/24  Subjective:   Chief Complaint  Patient presents with   Dysuria   Urinary Frequency  Patient ZO:XWRU Foland,Female DOB:1936-09-05,88 y.o. EAV:[id number]   88 y.o. Female, here with her husband, presents today for acute sick visit with Dysuria; Frequency; Urinary Malodor. Patient has a past medical history of UTIs. Her husband says that she tends to have frequent issues with UTIs that have similar symptoms to when she suffered a stroke, so tries to be proactive because she tends to be asymptomatic. She says that initially did have some mild dysuria, but her symptoms have mainly been frequency, while her husband notes that he has noticed the urinary malodor. He says that her symptoms have been occurring for the past 7-10 days. Denies N/V/D and Chills/Fever.  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 Cheek   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   Bridge Left Nose, and Left Neck   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 Cheek Tx: curret x 3 and 5FU   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   Bridge of nose tx Cx3 and 5FU, Left neck Tx Cx3 and 5FU   Thrombophlebitis      Allergies  Allergen Reactions   Gluten Meal Nausea And Vomiting    Patient has CELIAC DISEASE   Wheat Nausea And Vomiting    Patient has CELIAC DISEASE   Adhesive [Tape] Other (See Comments)    Patient's skin is VERY THIN and tears very easily; please use either paper tape or Coban wrap   Codeine Nausea And Vomiting and Other (See Comments)    other   Other Other (See Comments)    Patient has Hypoglycemia; her husband stated "when her sugar crashes, it crashes hard."   Penicillins Other (See Comments)    Doesn't remember reaction , but denies any SOB/Swelling States " it was a long time ago "     Family History  Problem Relation Age of Onset   Arthritis Mother        pt denies   Hypertension Mother        pt denies   Obesity Sister    Cancer Maternal Uncle        type unknown   Colon cancer Neg Hx    Stomach cancer Neg Hx    Esophageal cancer Neg Hx    Pancreatic cancer Neg Hx    Social History   Social History Narrative   Lives with husband   Right Handed   Drinks no caffeine  Married x65 years. Review of Systems  Constitutional:  Negative  for chills and fever.  Gastrointestinal:  Negative for diarrhea, nausea and vomiting.  Genitourinary:  Positive for dysuria and frequency.       (+) Urinary Malodor  All other systems reviewed and are negative.    Objective:  Vitals: BP 118/68   Pulse 74   Temp (!) 97.2 F (36.2 C) (Temporal)   Resp 12   Ht 5\' 3"  (1.6 m)   Wt 154 lb (69.9 kg)   SpO2 98%   BMI 27.28 kg/m   Physical Exam Vitals and nursing note reviewed.  Constitutional:      General: She is not in acute distress.    Appearance: Normal appearance. She is not toxic-appearing.  HENT:     Head: Normocephalic and atraumatic.  Pulmonary:     Effort: Pulmonary effort is normal.  Skin:    General: Skin is warm and dry.  Neurological:     Mental Status: She is alert and oriented to person, place, and time. Mental status is at baseline.  Psychiatric:         Mood and Affect: Mood normal.        Behavior: Behavior normal.        Thought Content: Thought content normal.        Judgment: Judgment normal.     Results:  Studies Obtained And Personally Reviewed By Me: Labs:     Component Value Date/Time   NA 140 10/04/2023 0841   K 4.5 10/04/2023 0841   CL 101 10/04/2023 0841   CO2 24 10/04/2023 0841   GLUCOSE 84 10/04/2023 0841   GLUCOSE 91 05/30/2022 0227   BUN 13 10/04/2023 0841   CREATININE 0.81 10/04/2023 0841   CALCIUM 9.7 10/04/2023 0841   PROT 6.9 10/04/2023 0841   ALBUMIN 4.4 10/04/2023 0841   AST 24 10/04/2023 0841   ALT 18 10/04/2023 0841   ALKPHOS 117 10/04/2023 0841   BILITOT 0.3 10/04/2023 0841   GFRNONAA >60 05/30/2022 0227   GFRAA >60 02/10/2018 0438    Lab Results  Component Value Date   WBC 8.2 10/04/2023   HGB 14.7 10/04/2023   HCT 44.8 10/04/2023   MCV 98 (H) 10/04/2023   PLT 267 10/04/2023   Lab Results  Component Value Date   CHOL 145 10/04/2023   HDL 64 10/04/2023   LDLCALC 62 10/04/2023   TRIG 105 10/04/2023   CHOLHDL 2.3 10/04/2023   Lab Results  Component Value Date   HGBA1C 5.6 10/04/2023    Lab Results  Component Value Date   TSH 2.950 12/29/2022    Results for orders placed or performed in visit on 01/06/24  POCT urinalysis dipstick  Result Value Ref Range   Color, UA yellow yellow   Clarity, UA turbid (A) clear   Glucose, UA negative negative mg/dL   Bilirubin, UA negative negative   Ketones, POC UA negative negative mg/dL   Spec Grav, UA 0.981 1.914 - 1.025   Blood, UA small (A) negative   pH, UA 6.0 5.0 - 8.0   Protein Ur, POC negative negative mg/dL   Urobilinogen, UA 0.2 0.2 or 1.0 E.U./dL   Nitrite, UA Positive (A) Negative   Leukocytes, UA Moderate (2+) (A) Negative   Assessment & Plan:   Acute UTI: UA today abnormal, Contains both nitrite and occult blood. Sending for culture. Sending in 250 mg Cipro - take 1 tablet (250 mg total) by mouth 2 (two) times daily x 7  days.  Addendum:Urine culture grew Enterobacter cloacae  complex sensitive to levaquin.   I,Emily Lagle,acting as a Neurosurgeon for Sylvan Evener, MD.,have documented all relevant documentation on the behalf of Sylvan Evener, MD,as directed by  Sylvan Evener, MD while in the presence of Sylvan Evener, MD.   I, Sylvan Evener, MD, have reviewed all documentation for this visit. The documentation on 01/16/24 for the exam, diagnosis, procedures, and orders are all accurate and complete.

## 2024-01-06 NOTE — Telephone Encounter (Signed)
 Chief Complaint: dysuria Symptoms: dysuria, frequency, foul odor to urine Frequency: "couple days" Pertinent Negatives: Patient denies N/V, hematuria, new or worsening back pain, abd pain, flank pain Disposition: [] ED /[] Urgent Care (no appt availability in office) / [x] Appointment(In office/virtual)/ []  [place] [facility]/ [] Home Care/ [] Refused Recommended Disposition /[] [place] [facility]/ []  Follow-up with PCP Additional Notes: RN called the preferred contact number in the CRM, reached pt's husband. Husband states pt is currently in the bathroom. Husband states pt has had UTI symptoms for "a couple days", endorses burning with urination, a foul odor to the urine, and frequency. Per husband, pt "sometimes has a fever" at baseline and "is not in good health." Husband denies that pt has any new or worsening back pain, but does state pt has a hx of scoliosis and does often experience back pain at her baseline. Husband endorses some loss of appetite but states pt continues to drink fluids. Denies N/V. RN advised husband pt should be seen within 24 hours, preferably today before the weekend. No availability at the pt's home office. RN scheduled pt at [facility] office, husband agreeable to that plan, office is close to his house. RN advised husband pt should go to the ED for worsening. Husband verbalized understanding.    Copied From CRM [id number]. Reason for Triage: UTI symptoms, no appt available soon enough   Best contact: [phone]  Reason for Disposition  Urinating more frequently than usual (i.e., frequency)  Answer Assessment - Initial Assessment Questions 1. SYMPTOM: "What's the main symptom you're concerned about?" (e.g., frequency, incontinence)     Dysuria, foul urine odor, frequency 2. ONSET: "When did the pain start?"     Couple days ago 3. PAIN: "Is there any pain?" If Yes, ask: "How bad is it?" (Scale: 1-10; mild, moderate, severe)     Mild pain, per husband - "she isn't  screaming in pain"  4. CAUSE: "What do you think is causing the symptoms?"     Possible UTI 5. OTHER SYMPTOMS: "Do you have any other symptoms?" (e.g., blood in urine, fever, flank pain, pain with urination)     Frequent urination. Pt has incontinence, not new, wearing briefs. Denies new or worsening back pain, hx of scoliosis. Husband states that she sometimes has a fever and "is not in good health." Denies N/V. Husband states she is not eating as much as usual. Husband states he tries to make sure she is drinking fluids.  Protocols used: Urinary Symptoms-A-AH

## 2024-01-06 NOTE — Patient Instructions (Addendum)
 It was a pleasure to see you today. A urine culture has been ordered. I will contact you with results. Please take Cipro 250 mg twice daily for 7 days.

## 2024-01-08 LAB — URINE CULTURE
MICRO NUMBER:: 16290435
SPECIMEN QUALITY:: ADEQUATE

## 2024-01-09 ENCOUNTER — Encounter: Payer: Self-pay | Admitting: Adult Health

## 2024-01-09 ENCOUNTER — Ambulatory Visit: Payer: Medicare (Managed Care) | Admitting: Adult Health

## 2024-01-09 VITALS — BP 121/59 | HR 77 | Ht 63.0 in | Wt 148.0 lb

## 2024-01-09 DIAGNOSIS — I63532 Cerebral infarction due to unspecified occlusion or stenosis of left posterior cerebral artery: Secondary | ICD-10-CM

## 2024-01-09 DIAGNOSIS — F01A0 Vascular dementia, mild, without behavioral disturbance, psychotic disturbance, mood disturbance, and anxiety: Secondary | ICD-10-CM | POA: Diagnosis not present

## 2024-01-09 NOTE — Patient Instructions (Addendum)
 Your Plan:  Continue Aricept 10mg  daily and Namenda 5mg  twice daily   Continue to follow with urology   Continue close follow-up with PCP for aggressive stroke risk factor management      Follow-up in 6 months or call earlier if needed      Thank you for coming to see [place] at [facility]. I hope we have been able to provide you high quality care today.  You may receive a patient satisfaction survey over the next few weeks. We would appreciate your feedback and comments so that we may continue to improve ourselves and the health of our patients.

## 2024-01-11 ENCOUNTER — Encounter: Payer: Self-pay | Admitting: Podiatry

## 2024-01-11 ENCOUNTER — Ambulatory Visit: Payer: Medicare (Managed Care) | Admitting: Podiatry

## 2024-01-11 DIAGNOSIS — M79674 Pain in right toe(s): Secondary | ICD-10-CM

## 2024-01-11 DIAGNOSIS — B351 Tinea unguium: Secondary | ICD-10-CM | POA: Diagnosis not present

## 2024-01-11 DIAGNOSIS — D689 Coagulation defect, unspecified: Secondary | ICD-10-CM

## 2024-01-11 DIAGNOSIS — M79675 Pain in left toe(s): Secondary | ICD-10-CM

## 2024-01-11 NOTE — Progress Notes (Signed)
 This patient returns to my office for at risk foot care.  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  This patient states she is having pain in the outside border right big toe.  The toe is painful walking and wearing her shoes.  She presents to the office with her husband.  This patient presents for at risk foot care today.  General Appearance  Alert, conversant and in no acute stress.  Vascular  Dorsalis pedis and posterior tibial  pulses are  weakly palpable  bilaterally.  Capillary return is within normal limits  bilaterally. Temperature is within normal limits  bilaterally.  Neurologic  Senn-Weinstein monofilament wire test within normal limits  bilaterally. Muscle power within normal limits bilaterally.  Nails Thick disfigured discolored nails with subungual debris  hallux  nails bilaterally. No evidence of bacterial infection or drainage bilaterally.  Orthopedic  No limitations of motion  feet .  No crepitus or effusions noted.  No bony pathology or digital deformities noted.  Skin  normotropic skin with no porokeratosis noted bilaterally.  No signs of infections or ulcers noted.     Paronychia right great toenail.  Pain in right toes  Pain in left toes  Consent was obtained for treatment procedures.   Mechanical debridement of nails 1-5  bilaterally performed with a nail nipper.  Filed with dremel without incident. Cleaned lateral border right hallux and no pus is noted.   Return office visit    10 weeks.                 Told patient to return for periodic foot care and evaluation due to potential at risk complications.   Helane Gunther DPM

## 2024-01-16 ENCOUNTER — Encounter: Payer: Self-pay | Admitting: Internal Medicine

## 2024-01-20 ENCOUNTER — Other Ambulatory Visit: Payer: Self-pay | Admitting: Family Medicine

## 2024-01-20 DIAGNOSIS — K219 Gastro-esophageal reflux disease without esophagitis: Secondary | ICD-10-CM

## 2024-01-21 ENCOUNTER — Other Ambulatory Visit: Payer: Self-pay | Admitting: Family Medicine

## 2024-01-21 DIAGNOSIS — E039 Hypothyroidism, unspecified: Secondary | ICD-10-CM

## 2024-01-24 DIAGNOSIS — R350 Frequency of micturition: Secondary | ICD-10-CM | POA: Diagnosis not present

## 2024-01-24 DIAGNOSIS — N3946 Mixed incontinence: Secondary | ICD-10-CM | POA: Diagnosis not present

## 2024-01-27 ENCOUNTER — Telehealth: Payer: Self-pay | Admitting: *Deleted

## 2024-01-27 NOTE — Telephone Encounter (Signed)
 Copied from CRM [id number]. Topic: General - Other >> Jan 18, 2024 10:34 AM Jethro Morrison wrote: Reason for CRM: THE DAUGHTER OF THE PATIENT KELLY Dempster CALLED REQUESTING THE OFFICE MANAGER NAME, ADVISED I WOULD SEND A MESSAGE FOR SOMEONE TO CONTACT HER, SHE STATED IT WAS NOT A COMPLAINT. THANKS

## 2024-01-30 NOTE — Telephone Encounter (Signed)
 I called daughter on Friday and LM that Dr Liane Redman was not accepting NPs and shared there info of [facility] for her to see if they are accepting NPs.

## 2024-02-10 ENCOUNTER — Ambulatory Visit: Payer: Self-pay

## 2024-02-10 ENCOUNTER — Other Ambulatory Visit: Payer: Self-pay | Admitting: Family Medicine

## 2024-02-10 DIAGNOSIS — E785 Hyperlipidemia, unspecified: Secondary | ICD-10-CM

## 2024-02-10 NOTE — Telephone Encounter (Signed)
 Copied from CRM [id number]. Topic: Clinical - Red Word Triage >> Feb 10, 2024 10:43 AM Caroline [name] wrote: Red Word that prompted transfer to Nurse Triage: Patient is calling has sore throat, chest congestion  Chief Complaint: cough and congestion after cruise. Symptoms: see above Frequency: constant Pertinent Negatives: Patient denies fever, sob, cp Disposition: [] ED /[x] Urgent Care (no appt availability in office) / [] Appointment(In office/virtual)/ []  [place] [facility]/ [] Home Care/ [] Refused Recommended Disposition /[] [place] [facility]/ []  Follow-up with PCP Additional Notes: per protocol instructed to go to UC; care advice given, denies questions; instructed to go to ER if becomes worse.   Reason for Disposition  Cough has been present for > 3 weeks  Answer Assessment - Initial Assessment Questions 1. ONSET: "When did the cough begin?"      Couple days ago 2. SEVERITY: "How bad is the cough today?"      mild 3. SPUTUM: "Describe the color of your sputum" (none, dry cough; clear, white, yellow, green)     denies 4. HEMOPTYSIS: "Are you coughing up any blood?" If so ask: "How much?" (flecks, streaks, tablespoons, etc.)     no 5. DIFFICULTY BREATHING: "Are you having difficulty breathing?" If Yes, ask: "How bad is it?" (e.g., mild, moderate, severe)    - MILD: No SOB at rest, mild SOB with walking, speaks normally in sentences, can lie down, no retractions, pulse < 100.    - MODERATE: SOB at rest, SOB with minimal exertion and prefers to sit, cannot lie down flat, speaks in phrases, mild retractions, audible wheezing, pulse 100-120.    - SEVERE: Very SOB at rest, speaks in single words, struggling to breathe, sitting hunched forward, retractions, pulse > 120      no 6. FEVER: "Do you have a fever?" If Yes, ask: "What is your temperature, how was it measured, and when did it start?"     no 7. CARDIAC HISTORY: "Do you have any history of heart disease?" (e.g., heart attack,  congestive heart failure)      no 8. LUNG HISTORY: "Do you have any history of lung disease?"  (e.g., pulmonary embolus, asthma, emphysema)     no 9. PE RISK FACTORS: "Do you have a history of blood clots?" (or: recent major surgery, recent prolonged travel, bedridden)     no 10. OTHER SYMPTOMS: "Do you have any other symptoms?" (e.g., runny nose, wheezing, chest pain)       Runny nose 11. PREGNANCY: "Is there any chance you are pregnant?" "When was your last menstrual period?"       na 12. TRAVEL: "Have you traveled out of the country in the last month?" (e.g., travel history, exposures)       Was on a cruise,  [place].  Protocols used: Cough - Acute Productive-A-AH

## 2024-02-10 NOTE — Telephone Encounter (Addendum)
 Patient PCP is not at this office.

## 2024-02-29 ENCOUNTER — Ambulatory Visit: Payer: Medicare HMO | Admitting: Adult Health

## 2024-03-08 ENCOUNTER — Encounter: Payer: Self-pay | Admitting: Dermatology

## 2024-03-08 ENCOUNTER — Ambulatory Visit (INDEPENDENT_AMBULATORY_CARE_PROVIDER_SITE_OTHER): Payer: Medicare (Managed Care)

## 2024-03-08 ENCOUNTER — Ambulatory Visit: Payer: Medicare (Managed Care) | Admitting: Dermatology

## 2024-03-08 DIAGNOSIS — N390 Urinary tract infection, site not specified: Secondary | ICD-10-CM | POA: Diagnosis not present

## 2024-03-08 DIAGNOSIS — B079 Viral wart, unspecified: Secondary | ICD-10-CM | POA: Diagnosis not present

## 2024-03-08 DIAGNOSIS — Z872 Personal history of diseases of the skin and subcutaneous tissue: Secondary | ICD-10-CM

## 2024-03-08 DIAGNOSIS — R32 Unspecified urinary incontinence: Secondary | ICD-10-CM

## 2024-03-08 DIAGNOSIS — R410 Disorientation, unspecified: Secondary | ICD-10-CM

## 2024-03-08 LAB — POCT URINALYSIS DIPSTICK
Bilirubin, UA: NEGATIVE
Blood, UA: NEGATIVE
Glucose, UA: NEGATIVE
Ketones, UA: NEGATIVE
Leukocytes, UA: NEGATIVE
Nitrite, UA: NEGATIVE
Protein, UA: NEGATIVE
Spec Grav, UA: 1.015 (ref 1.010–1.025)
Urobilinogen, UA: 0.2 U/dL
pH, UA: 6.5 (ref 5.0–8.0)

## 2024-03-08 NOTE — Progress Notes (Signed)
   Follow-Up Visit   Subjective  Caroline [name] is a 88 y.o. female who presents for the following: Intertrigo - AK   Patient present today for follow up visit. Patient was last evaluated on 09/08/23. At this visit patient was prescribed:  Econazole Nitrate  1% cream - use on affected areas BID until clear Recommended using ZeasorbAF in problem areas daily  Patient reports sxs are better. Patient denies medication changes.  The following portions of the chart were reviewed this encounter and updated as appropriate: medications, allergies, medical history  Review of Systems:  No other skin or systemic complaints except as noted in HPI or Assessment and Plan.   Objective  Well appearing patient in no apparent distress; mood and affect are within normal limits.   A focused examination was performed of the following areas: under breast & face   Relevant exam findings are noted in the Assessment and Plan.  Right Anterior Mandible Verrucous papules   Assessment & Plan    INTERTRIGO Exam: Resolved, Erythematous macerated patches in body folds  clear  Intertrigo is a chronic recurrent rash that occurs in skin fold areas that may be associated with friction; heat; moisture; yeast; fungus; and bacteria.  It is exacerbated by increased movement / activity; sweating; and higher atmospheric temperature.  Use of an absorbant powder such as Zeasorb AF powder or other OTC antifungal powder to the area daily can prevent rash recurrence. Other options to help keep the area dry include blow drying the area after bathing or using antiperspirant products such as Duradry sweat minimizing gel.  Treatment Plan: - Recommended Zeasorb AF to use in affected areas   WART Exam: verrucous papule(s)  Counseling Discussed viral / HPV (Human Papilloma Virus) etiology and risk of spread /infectivity to other areas of body as well as to other people.  Multiple treatments and methods may be required to clear  warts and it is possible treatment may not be successful.  Treatment risks include discoloration; scarring and there is still potential for wart recurrence.  OTHER VIRAL WARTS Right Alar Crease VIRAL WARTS, UNSPECIFIED TYPE Right Anterior Mandible Destruction of lesion - Right Anterior Mandible Complexity: simple   Destruction method: cryotherapy   Informed consent: discussed and consent obtained   Timeout:  patient name, date of birth, surgical site, and procedure verified Lesion destroyed using liquid nitrogen: Yes   Post-procedure details: wound care instructions given     No follow-ups on file.   Documentation: I have reviewed the above documentation for accuracy and completeness, and I agree with the above.  I, Shirron Louanne Roussel, CMA, am acting as scribe for [employer], DO.   Louana Roup, DO

## 2024-03-08 NOTE — Assessment & Plan Note (Signed)
 Patient's husband notes that she has been more confused over the last 2 weeks, however the patient herself denies that she has been confused.  On exam today she was alert and oriented x 3.  Ordered point-of-care urinalysis today in the clinic with the urine that the patient brought with her from home.  Urinalysis was negative for leukocytes, nitrites, blood.  Explained to the patient and her husband that because urinalysis was negative and patient is asymptomatic, there is no need for further antibiotic therapy at this time.

## 2024-03-08 NOTE — Assessment & Plan Note (Signed)
 Referral placed per patient and patient's husband request to [facility] health urology in [place].  Recommended that they continue the Keflex  250 mg as prescribed by the urologist until they are able to get into see their new urologist and discuss treatment options for UTI prevention.  Advised patient to continue using over-the-counter Imodium to help with diarrhea symptoms that are likely associated with the daily Keflex .

## 2024-03-08 NOTE — Progress Notes (Signed)
 Established Patient Office Visit  Subjective   Patient ID: Caroline [name], female    DOB: 1936-08-19  Age: 88 y.o. MRN: [id number]  Chief Complaint  Patient presents with   New Patient (Initial Visit)    HPI Caroline [name]  is a 88 y/o female who presents to the clinic today accompanied by her husband with complaints of recent diarrhea and confusion.  This patient has a history of frequent UTI who she follows with annually by Dr. Clarke Crouch at [facility].  As of 02/17/2024, her urologist started her on Keflex  250 mg daily for prophylaxis of UTI.  Patient's husband states that around the time when the patient started the Keflex  daily, the diarrhea also started.  He states that they recently went on a cruise and got back around the first week in May, and so he originally thought that her diarrhea was a stomach virus from the cruise.  However, the diarrhea has persisted daily and he denies any current symptoms of diarrhea himself.  Patient's husband reports that over the last week he has only been giving the patient her Keflex  every other day/every third day.  He and the patient reports that since then, her diarrhea has been improving.  Patient denies any associated nausea, fevers, abdominal pain, blood in the stool.  The patient and her husband are requesting an internal referral to [facility] health urology in [place] in hopes of transferring their care from their previous urologist to a new one that is within the [facility] system.  Of note, patient's husband reports that the patient has seemed more a little more confused over the last 2 weeks.  The patient herself states that she does not feel that she has been more confused.  He has brought in a sample of the patient's urine and is requesting urinalysis to rule out UTI as a cause of her confusion.  The patient otherwise denies symptoms of dysuria, burning, frequency, blood in the urine, pain over the bladder, or fevers.     ROS Per HPI.    Objective:      There were no vitals taken for this visit.   Physical Exam Constitutional:      General: She is not in acute distress.    Appearance: Normal appearance.  Cardiovascular:     Rate and Rhythm: Normal rate and regular rhythm.     Heart sounds: Normal heart sounds. No murmur heard.    No friction rub. No gallop.  Pulmonary:     Effort: Pulmonary effort is normal. No respiratory distress.     Breath sounds: Normal breath sounds.  Abdominal:     Tenderness: There is no right CVA tenderness or left CVA tenderness.  Musculoskeletal:        General: No swelling.  Skin:    General: Skin is warm and dry.  Neurological:     General: No focal deficit present.     Mental Status: She is alert.  Psychiatric:        Mood and Affect: Mood normal.        Behavior: Behavior normal.        Thought Content: Thought content normal.    Results for orders placed or performed in visit on 03/08/24  POCT Urinalysis Dipstick  Result Value Ref Range   Color, UA yellow    Clarity, UA clear    Glucose, UA Negative Negative   Bilirubin, UA negative    Ketones, UA negative    Spec Grav, UA 1.015  1.010 - 1.025   Blood, UA negative    pH, UA 6.5 5.0 - 8.0   Protein, UA Negative Negative   Urobilinogen, UA 0.2 0.2 or 1.0 E.U./dL   Nitrite, UA negative    Leukocytes, UA Negative Negative   Appearance     Odor        The ASCVD Risk score (Arnett DK, et al., 2019) failed to calculate for the following reasons:   The 2019 ASCVD risk score is only valid for ages 74 to 31   Risk score cannot be calculated because patient has a medical history suggesting prior/existing ASCVD    Assessment & Plan:   Confusion Assessment & Plan: Patient's husband notes that she has been more confused over the last 2 weeks, however the patient herself denies that she has been confused.  On exam today she was alert and oriented x 3.  Ordered point-of-care urinalysis today in the clinic with the urine that the  patient brought with her from home.  Urinalysis was negative for leukocytes, nitrites, blood.  Explained to the patient and her husband that because urinalysis was negative and patient is asymptomatic, there is no need for further antibiotic therapy at this time.   Orders: -     POCT urinalysis dipstick  Frequent UTI Assessment & Plan: Referral placed per patient and patient's husband request to [facility] health urology in [place].  Recommended that they continue the Keflex  250 mg as prescribed by the urologist until they are able to get into see their new urologist and discuss treatment options for UTI prevention.  Advised patient to continue using over-the-counter Imodium to help with diarrhea symptoms that are likely associated with the daily Keflex .   Orders: -     Ambulatory referral to Urology  Urinary incontinence, unspecified type -     Ambulatory referral to Urology   Return in about 6 weeks (around 04/19/2024) for Med check.    Odilia Bennett, PA-C

## 2024-03-08 NOTE — Patient Instructions (Signed)
 It was nice to see you today!  As we discussed in clinic:  -I have placed the referral to urology in [place] as requested. They will call you to schedule this appointment.  -Continue the Imodium as needed for the diarrhea. I suspect that the diarrhea is a side effect of the Keflex . The urologist should be able to help determine other UTI prevention options that may not lead to diarrhea.  -I will plan to see you in July for fasting labs and a follow up on medication management!  If you have any problems before your next visit feel free to message me via MyChart (minor issues or questions) or call the office, otherwise you may reach out to schedule an office visit.  Thank you! Meryl Acosta, PA-C

## 2024-03-08 NOTE — Patient Instructions (Addendum)
 b   Cryotherapy Aftercare  Wash gently with soap and water everyday.   Apply Vaseline and Band-Aid daily until healed.    Important Information  Due to recent changes in healthcare laws, you may see results of your pathology and/or laboratory studies on MyChart before the doctors have had a chance to review them. We understand that in some cases there may be results that are confusing or concerning to you. Please understand that not all results are received at the same time and often the doctors may need to interpret multiple results in order to provide you with the best plan of care or course of treatment. Therefore, we ask that you please give us  2 business days to thoroughly review all your results before contacting the office for clarification. Should we see a critical lab result, you will be contacted sooner.   If You Need Anything After Your Visit  If you have any questions or concerns for your doctor, please call our main line at [phone] If no one answers, please leave a voicemail as directed and we will return your call as soon as possible. Messages left after 4 pm will be answered the following business day.   You may also send us  a message via MyChart. We typically respond to MyChart messages within 1-2 business days.  For prescription refills, please ask your pharmacy to contact our office. Our fax number is [phone].  If you have an urgent issue when the clinic is closed that cannot wait until the next business day, you can page your doctor at the number below.    Please note that while we do our best to be available for urgent issues outside of office hours, we are not available 24/7.   If you have an urgent issue and are unable to reach us , you may choose to seek medical care at your doctor's office, retail clinic, urgent care center, or emergency room.  If you have a medical emergency, please immediately call 911 or go to the emergency department. In the event of  inclement weather, please call our main line at [phone] for an update on the status of any delays or closures.  Dermatology Medication Tips: Please keep the boxes that topical medications come in in order to help keep track of the instructions about where and how to use these. Pharmacies typically print the medication instructions only on the boxes and not directly on the medication tubes.   If your medication is too expensive, please contact our office at [phone] or send us  a message through MyChart.   We are unable to tell what your co-pay for medications will be in advance as this is different depending on your insurance coverage. However, we may be able to find a substitute medication at lower cost or fill out paperwork to get insurance to cover a needed medication.   If a prior authorization is required to get your medication covered by your insurance company, please allow us  1-2 business days to complete this process.  Drug prices often vary depending on where the prescription is filled and some pharmacies may offer cheaper prices.  The website www.goodrx.com contains coupons for medications through different pharmacies. The prices here do not account for what the cost may be with help from insurance (it may be cheaper with your insurance), but the website can give you the price if you did not use any insurance.  - You can print the associated coupon and take it with your prescription to  the pharmacy.  - You may also stop by our office during regular business hours and pick up a GoodRx coupon card.  - If you need your prescription sent electronically to a different pharmacy, notify our office through [facility] or by phone at [phone]

## 2024-03-27 ENCOUNTER — Encounter: Payer: Self-pay | Admitting: Podiatry

## 2024-03-27 ENCOUNTER — Ambulatory Visit: Payer: Medicare (Managed Care) | Admitting: Podiatry

## 2024-03-27 DIAGNOSIS — D689 Coagulation defect, unspecified: Secondary | ICD-10-CM

## 2024-03-27 DIAGNOSIS — M79675 Pain in left toe(s): Secondary | ICD-10-CM

## 2024-03-27 DIAGNOSIS — B351 Tinea unguium: Secondary | ICD-10-CM

## 2024-03-27 DIAGNOSIS — M79674 Pain in right toe(s): Secondary | ICD-10-CM

## 2024-03-27 NOTE — Progress Notes (Signed)
 This patient returns to my office for at risk foot care.  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  This patient states she is having pain in the outside border right big toe.  The toe is painful walking and wearing her shoes.  She presents to the office with her husband.  This patient presents for at risk foot care today.  General Appearance  Alert, conversant and in no acute stress.  Vascular  Dorsalis pedis and posterior tibial  pulses are  weakly palpable  bilaterally.  Capillary return is within normal limits  bilaterally. Temperature is within normal limits  bilaterally.  Neurologic  Senn-Weinstein monofilament wire test within normal limits  bilaterally. Muscle power within normal limits bilaterally.  Nails Thick disfigured discolored nails with subungual debris  hallux  nails bilaterally. No evidence of bacterial infection or drainage bilaterally.  Orthopedic  No limitations of motion  feet .  No crepitus or effusions noted.  No bony pathology or digital deformities noted.  Skin  normotropic skin with no porokeratosis noted bilaterally.  No signs of infections or ulcers noted.     Paronychia right great toenail.  Pain in right toes  Pain in left toes  Consent was obtained for treatment procedures.   Mechanical debridement of nails 1-5  bilaterally performed with a nail nipper.  Filed with dremel without incident. Cleaned lateral border right hallux and no pus is noted.   Return office visit    9 weeks.                 Told patient to return for periodic foot care and evaluation due to potential at risk complications.   Cordella Bold DPM

## 2024-04-02 ENCOUNTER — Other Ambulatory Visit: Payer: Self-pay | Admitting: *Deleted

## 2024-04-02 DIAGNOSIS — E785 Hyperlipidemia, unspecified: Secondary | ICD-10-CM

## 2024-04-02 DIAGNOSIS — R7301 Impaired fasting glucose: Secondary | ICD-10-CM

## 2024-04-02 DIAGNOSIS — Z131 Encounter for screening for diabetes mellitus: Secondary | ICD-10-CM

## 2024-04-02 DIAGNOSIS — I10 Essential (primary) hypertension: Secondary | ICD-10-CM

## 2024-04-03 ENCOUNTER — Other Ambulatory Visit: Payer: Medicare HMO

## 2024-04-03 DIAGNOSIS — R7301 Impaired fasting glucose: Secondary | ICD-10-CM | POA: Diagnosis not present

## 2024-04-03 DIAGNOSIS — E785 Hyperlipidemia, unspecified: Secondary | ICD-10-CM

## 2024-04-03 DIAGNOSIS — Z131 Encounter for screening for diabetes mellitus: Secondary | ICD-10-CM | POA: Diagnosis not present

## 2024-04-03 DIAGNOSIS — I10 Essential (primary) hypertension: Secondary | ICD-10-CM | POA: Diagnosis not present

## 2024-04-04 ENCOUNTER — Ambulatory Visit: Payer: Self-pay

## 2024-04-04 LAB — LIPID PANEL
Chol/HDL Ratio: 2.3 ratio (ref 0.0–4.4)
Cholesterol, Total: 131 mg/dL (ref 100–199)
HDL: 57 mg/dL (ref >39)
LDL Chol Calc (NIH): 56 mg/dL (ref 0–99)
Triglycerides: 95 mg/dL (ref 0–149)
VLDL Cholesterol Cal: 18 mg/dL (ref 5–40)

## 2024-04-04 LAB — CBC WITH DIFFERENTIAL/PLATELET
Basophils Absolute: 0.1 10*3/uL (ref 0.0–0.2)
Basos: 1 %
EOS (ABSOLUTE): 0.4 10*3/uL (ref 0.0–0.4)
Eos: 4 %
Hematocrit: 44.7 % (ref 34.0–46.6)
Hemoglobin: 14.2 g/dL (ref 11.1–15.9)
Immature Grans (Abs): 0 10*3/uL (ref 0.0–0.1)
Immature Granulocytes: 0 %
Lymphocytes Absolute: 1.9 10*3/uL (ref 0.7–3.1)
Lymphs: 19 %
MCH: 31.6 pg (ref 26.6–33.0)
MCHC: 31.8 g/dL (ref 31.5–35.7)
MCV: 100 fL — ABNORMAL HIGH (ref 79–97)
Monocytes Absolute: 0.7 10*3/uL (ref 0.1–0.9)
Monocytes: 7 %
Neutrophils Absolute: 6.5 10*3/uL (ref 1.4–7.0)
Neutrophils: 69 %
Platelets: 250 10*3/uL (ref 150–450)
RBC: 4.49 x10E6/uL (ref 3.77–5.28)
RDW: 12.8 % (ref 11.7–15.4)
WBC: 9.6 10*3/uL (ref 3.4–10.8)

## 2024-04-04 LAB — COMPREHENSIVE METABOLIC PANEL WITH GFR
ALT: 15 IU/L (ref 0–32)
AST: 22 IU/L (ref 0–40)
Albumin: 4.4 g/dL (ref 3.7–4.7)
Alkaline Phosphatase: 109 IU/L (ref 44–121)
BUN/Creatinine Ratio: 15 (ref 12–28)
BUN: 12 mg/dL (ref 8–27)
Bilirubin Total: 0.2 mg/dL (ref 0.0–1.2)
CO2: 23 mmol/L (ref 20–29)
Calcium: 9.6 mg/dL (ref 8.7–10.3)
Chloride: 101 mmol/L (ref 96–106)
Creatinine, Ser: 0.82 mg/dL (ref 0.57–1.00)
Globulin, Total: 2.4 g/dL (ref 1.5–4.5)
Glucose: 64 mg/dL — ABNORMAL LOW (ref 70–99)
Potassium: 4.6 mmol/L (ref 3.5–5.2)
Sodium: 140 mmol/L (ref 134–144)
Total Protein: 6.8 g/dL (ref 6.0–8.5)
eGFR: 70 mL/min/{1.73_m2} (ref >59)

## 2024-04-04 LAB — HEMOGLOBIN A1C
Est. average glucose Bld gHb Est-mCnc: 114 mg/dL
Hgb A1c MFr Bld: 5.6 % (ref 4.8–5.6)

## 2024-04-04 LAB — TSH: TSH: 2.85 u[IU]/mL (ref 0.450–4.500)

## 2024-04-10 ENCOUNTER — Ambulatory Visit: Payer: Medicare HMO | Admitting: Family Medicine

## 2024-04-19 ENCOUNTER — Ambulatory Visit: Payer: Medicare (Managed Care) | Admitting: Urology

## 2024-04-19 VITALS — BP 160/71 | HR 63 | Ht 63.0 in | Wt 148.0 lb

## 2024-04-19 DIAGNOSIS — N390 Urinary tract infection, site not specified: Secondary | ICD-10-CM

## 2024-04-19 LAB — URINALYSIS, COMPLETE
Bilirubin, UA: NEGATIVE
Glucose, UA: NEGATIVE
Ketones, UA: NEGATIVE
Nitrite, UA: NEGATIVE
Protein,UA: NEGATIVE
RBC, UA: NEGATIVE
Specific Gravity, UA: 1.02 (ref 1.005–1.030)
Urobilinogen, Ur: 0.2 mg/dL (ref 0.2–1.0)
pH, UA: 6 (ref 5.0–7.5)

## 2024-04-19 LAB — MICROSCOPIC EXAMINATION
Epithelial Cells (non renal): >10 /HPF — AB (ref 0–10)
RBC, Urine: NONE SEEN /HPF (ref 0–2)

## 2024-04-19 MED ORDER — TRIMETHOPRIM 100 MG PO TABS: 100.0000 mg | ORAL_TABLET | Freq: Every day | ORAL | 3 refills | Status: DC

## 2024-04-19 MED ORDER — ESTRADIOL 0.1 MG/GM VA CREA: TOPICAL_CREAM | VAGINAL | 0 refills | Status: AC

## 2024-04-19 NOTE — Progress Notes (Signed)
 I, Maysun LITTIE Griffiths, acting as a scribe for Glendia JAYSON Barba, MD., have documented all relevant documentation on the behalf of Glendia JAYSON Barba, MD, as directed by Glendia JAYSON Barba, MD while in the presence of Glendia JAYSON Barba, MD.  04/19/2024 4:20 PM   Caroline [name] 06-14-1936 [id number]  Referring provider: Gayle Saddie FALCON, PA-C [address] [place],  [place] [address]  Chief Complaint  Patient presents with   Recurrent UTI    HPI: Caroline [name] is a 88 y.o. female follow-up regarding recurrent UTI. Presents with her husband today.  Referred to my prior note 11/06/2021.  I last saw her on 12/11/2021, and they elected to go back to seeing Dr. Gaston in [place], who he was seeing approximately every 6 months. She has continued to have recurrent urinary tract infections, and typically her symptoms are altered mental status and increased confusion.  She has failed multiple oral medications for chronic urinary incontinence, including Oxybutynin, Tolterodine, and Mirabegron . She has also had PTNS.  Dr. MacDiarmid had her on trimethoprim  100 mg daily for prophylaxis. She had a recurrent infection on trimethoprim , and she was switched to Cefalixin 250 mg daily. This appeared to be working, however she developed a significant diarrhea and had to discontinue the medication. She is now back on trimethoprim .  They called for an appointment here, as they wanted to get back into the [facility]  She presently has no complaints.    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 Cheek   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   Bridge Left Nose, and Left Neck   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 Cheek Tx: curret x 3 and 5FU   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   Bridge of nose tx Cx3 and 5FU, Left neck Tx Cx3 and 5FU   Thrombophlebitis     Surgical History: Past Surgical History:  Procedure Laterality Date   ABDOMINAL HYSTERECTOMY  1979   CATARACT EXTRACTION, BILATERAL     knee surgery Left    VARICOSE VEIN SURGERY Left    XI ROBOTIC ASSISTED PARAESOPHAGEAL HERNIA REPAIR N/A 06/15/2021   Procedure: XI ROBOTIC ASSISTED PARAESOPHAGEAL HERNIA REPAIR, GASTROPEXY, UPPER ENDOSCOPY;  Surgeon: Signe Mitzie LABOR, MD;  Location: [facility];  Service: General;  Laterality: N/A;    Home Medications:  Allergies as of 04/19/2024       Reactions   Gluten Meal Nausea And Vomiting   Patient has CELIAC DISEASE   Wheat Nausea And Vomiting   Patient has CELIAC DISEASE   Adhesive [tape] Other (See Comments)   Patient's skin is VERY THIN and tears very easily; please use either paper tape or Coban wrap   Codeine Nausea And  Vomiting, Other (See Comments)   other   Other Other (See Comments)   Patient has Hypoglycemia; her husband stated when her sugar crashes, it crashes hard.   Penicillins Other (See Comments)   Doesn't remember reaction , but denies any SOB/Swelling States  it was a long time ago          Medication List        Accurate as of April 19, 2024  4:20 PM. If you have any questions, ask your nurse or doctor.          acetaminophen  325 MG tablet Commonly known as: TYLENOL  Take 2 tablets (650 mg total) by mouth every 6 (six) hours as needed for mild pain, moderate pain, fever or headache.   atorvastatin  80 MG tablet Commonly known as:  LIPITOR  TAKE 1 TABLET (80 MG TOTAL) BY MOUTH AT BEDTIME.   calcium  carbonate 1250 (500 Ca) MG tablet Commonly known as: [facility] Calcium  Carbonate Take 2 tablets (2,500 mg total) by mouth daily.   Catheters Misc Use as directed. 1 box of inserts and 1 box canisters.   cephALEXin  250 MG capsule Commonly known as: KEFLEX  Take 250 mg by mouth daily.   cholecalciferol  25 MCG (1000 UNIT) tablet Commonly known as: VITAMIN D3 Take 0.5 tablets (500 Units total) by mouth daily.   ciprofloxacin  250 MG tablet Commonly known as: Cipro  Take 1 tablet (250 mg total) by mouth 2 (two) times daily.   cyanocobalamin  250 MCG tablet Commonly known as: VITAMIN B12 Take 1 tablet (250 mcg total) by mouth daily.   donepezil  10 MG tablet Commonly known as: Aricept  Take 1 tablet (10 mg total) by mouth daily.   econazole nitrate  1 % cream Apply topically daily. Use on affected areas under breast twice a day until clear when flared.   Eliquis  5 MG Tabs tablet Generic drug: apixaban  TAKE 1 TABLET BY MOUTH 2 TIMES DAILY.   estradiol  0.1 MG/GM vaginal cream Commonly known as: ESTRACE  Apply a pea-sized amount to fingertip or Q-tip and wipe in vaginal roof twice weekly   furosemide  20 MG tablet Commonly known as: LASIX  Take 1 tablet (20 mg total) by mouth daily as needed for edema.   irbesartan  75 MG tablet Commonly known as: AVAPRO  TAKE 1 TABLET (75 MG TOTAL) BY MOUTH DAILY.   levothyroxine  125 MCG tablet Commonly known as: SYNTHROID  TAKE 1 TABLET (125 MCG TOTAL) BY MOUTH DAILY AT 6 AM.   meclizine  25 MG tablet Commonly known as: ANTIVERT  Take 0.5 tablets (12.5 mg total) by mouth 3 (three) times daily as needed for dizziness.   memantine  5 MG tablet Commonly known as: NAMENDA  Take 1 tablet (5 mg total) by mouth 2 (two) times daily.   ondansetron  4 MG tablet Commonly known as: ZOFRAN  Take 1 tablet (4 mg total) by mouth every 6 (six) hours.   pantoprazole  40 MG tablet Commonly known as:  PROTONIX  TAKE 1 TABLET BY MOUTH DAILY   QUEtiapine  25 MG tablet Commonly known as: SEROQUEL  TAKE 1 TABLET (25 MG TOTAL) BY MOUTH AT BEDTIME.   traMADol  50 MG tablet Commonly known as: ULTRAM  Take 50 mg by mouth 2 (two) times daily as needed.   trimethoprim  100 MG tablet Commonly known as: TRIMPEX  Take 1 tablet (100 mg total) by mouth daily.   vitamin C  1000 MG tablet Take 1,000 mg by mouth daily.        Allergies:  Allergies  Allergen Reactions   Gluten Meal Nausea And Vomiting  Patient has CELIAC DISEASE   Wheat Nausea And Vomiting    Patient has CELIAC DISEASE   Adhesive [Tape] Other (See Comments)    Patient's skin is VERY THIN and tears very easily; please use either paper tape or Coban wrap   Codeine Nausea And Vomiting and Other (See Comments)    other   Other Other (See Comments)    Patient has Hypoglycemia; her husband stated when her sugar crashes, it crashes hard.   Penicillins Other (See Comments)    Doesn't remember reaction , but denies any SOB/Swelling States  it was a long time ago      Family History: Family History  Problem Relation Age of Onset   Arthritis Mother        pt denies   Hypertension Mother        pt denies   Obesity Sister    Cancer Maternal Uncle        type unknown   Colon cancer Neg Hx    Stomach cancer Neg Hx    Esophageal cancer Neg Hx    Pancreatic cancer Neg Hx     Social History:  reports that she has never smoked. She has never been exposed to tobacco smoke. She has never used smokeless tobacco. She reports that she does not currently use drugs. She reports that she does not drink alcohol .   Physical Exam: BP (!) 160/71   Pulse 63   Ht 5' 3 (1.6 m)   Wt 148 lb (67.1 kg)   BMI 26.22 kg/m   Constitutional:  Alert and oriented, No acute distress. HEENT: [place] AT Respiratory: Normal respiratory effort, no increased work of breathing. Psychiatric: Normal mood and affect.   Urinalysis Yellow cloudy, trace  leukocytes, microscopy 0-5 WBC/negative RBC/>10 epis.   Assessment & Plan:    1. Recurrent UTIs Continue trimethoprim  100 mg daily- refills sent to pharmacy.  We discussed low-dose vaginal estrogen, which she has not used in the past and were interested in trying. Rx Estrace  sent to pharmacy. I discussed to not use the applicator and apply a pea-sized amount to the fingertip or Q-tip and wipe on the roof of the vagina twice weekly.  Blood followed up 6 months and instructed to call earlier for recurrent UTI suspicions.  [facility] [address], [address] [place], [place] [address] [phone]

## 2024-04-23 ENCOUNTER — Encounter: Payer: Self-pay | Admitting: Urology

## 2024-05-01 ENCOUNTER — Ambulatory Visit (INDEPENDENT_AMBULATORY_CARE_PROVIDER_SITE_OTHER): Payer: Medicare (Managed Care)

## 2024-05-01 VITALS — BP 122/67 | HR 78 | Temp 97.3°F | Ht 63.0 in | Wt 142.1 lb

## 2024-05-01 DIAGNOSIS — I10 Essential (primary) hypertension: Secondary | ICD-10-CM | POA: Diagnosis not present

## 2024-05-01 DIAGNOSIS — E039 Hypothyroidism, unspecified: Secondary | ICD-10-CM

## 2024-05-01 DIAGNOSIS — F01B0 Vascular dementia, moderate, without behavioral disturbance, psychotic disturbance, mood disturbance, and anxiety: Secondary | ICD-10-CM | POA: Diagnosis not present

## 2024-05-01 DIAGNOSIS — I824Z9 Acute embolism and thrombosis of unspecified deep veins of unspecified distal lower extremity: Secondary | ICD-10-CM | POA: Diagnosis not present

## 2024-05-01 DIAGNOSIS — E785 Hyperlipidemia, unspecified: Secondary | ICD-10-CM

## 2024-05-01 NOTE — Progress Notes (Signed)
 Established Patient Office Visit  Subjective   Patient ID: Caroline [name], female    DOB: 12-28-1935  Age: 88 y.o. MRN: [id number]  Chief Complaint  Patient presents with   Medication Management    HPI  Caroline [name] is a 88 y.o. y/o female who presents to the clinic today for follow up on HLD, hypothyroid, HTN, pre-DM.   HLD: Patient currently taking atorvastatin  daily for their cholesterol. Reports excellent compliance with this medication. Denies side effects/new myalgias.   Hypothyroid: Patient currently taking 125 mcg of Levothyroxine  daily. Reports excellent compliance with this medication. Reports taking this medication in the mornings away from other medications, food, or vitamins. Denies side effects of fatigue, skin changes, weight changes, or bowel habit changes.   HTN: Patient currently taking irbesartan  for control of their blood pressure. Reports excellence compliance with this medication. Checks BP at home daily. Husband reports that patient's BP has been running low at home most mornings with average readings of 95-100 SBP over 60-70 DBP. Denies associated dizziness or faintness. Denies CP, SOB, Palpitations, vision changes, HA, or edema.  Pre-DM: Not currently on any glucose lowering medications. Currently managing with diet. Does not check BG at home. Denies polydipsia, polyuria, or new sores/wounds that are not healing,     ROS Per HPI.    Objective:     BP 122/67   Pulse 78   Temp (!) 97.3 F (36.3 C) (Oral)   Ht 5' 3 (1.6 m)   Wt 142 lb 1.9 oz (64.5 kg)   SpO2 95%   BMI 25.18 kg/m    Physical Exam Constitutional:      General: She is not in acute distress.    Appearance: Normal appearance.  Cardiovascular:     Rate and Rhythm: Normal rate and regular rhythm.     Heart sounds: Normal heart sounds. No murmur heard.    No friction rub. No gallop.  Pulmonary:     Effort: Pulmonary effort is normal. No respiratory distress.     Breath sounds: Normal  breath sounds.  Musculoskeletal:        General: No swelling.  Skin:    General: Skin is warm and dry.  Neurological:     General: No focal deficit present.     Mental Status: She is alert.  Psychiatric:        Mood and Affect: Mood normal.        Behavior: Behavior normal.        Thought Content: Thought content normal.    No results found for any visits on 05/01/24.  Last CBC Lab Results  Component Value Date   WBC 9.6 04/03/2024   HGB 14.2 04/03/2024   HCT 44.7 04/03/2024   MCV 100 (H) 04/03/2024   MCH 31.6 04/03/2024   RDW 12.8 04/03/2024   PLT 250 04/03/2024   Last metabolic panel Lab Results  Component Value Date   GLUCOSE 64 (L) 04/03/2024   NA 140 04/03/2024   K 4.6 04/03/2024   CL 101 04/03/2024   CO2 23 04/03/2024   BUN 12 04/03/2024   CREATININE 0.82 04/03/2024   EGFR 70 04/03/2024   CALCIUM  9.6 04/03/2024   PROT 6.8 04/03/2024   ALBUMIN 4.4 04/03/2024   LABGLOB 2.4 04/03/2024   AGRATIO 1.9 12/29/2022   BILITOT 0.2 04/03/2024   ALKPHOS 109 04/03/2024   AST 22 04/03/2024   ALT 15 04/03/2024   ANIONGAP 8 05/30/2022   Last lipids Lab Results  Component Value  Date   CHOL 131 04/03/2024   HDL 57 04/03/2024   LDLCALC 56 04/03/2024   TRIG 95 04/03/2024   CHOLHDL 2.3 04/03/2024   Last hemoglobin A1c Lab Results  Component Value Date   HGBA1C 5.6 04/03/2024   Last thyroid  functions Lab Results  Component Value Date   TSH 2.850 04/03/2024   T3TOTAL 75 09/09/2021   T4TOTAL 10.3 09/09/2021      The ASCVD Risk score (Arnett DK, et al., 2019) failed to calculate for the following reasons:   The 2019 ASCVD risk score is only valid for ages 81 to 32   Risk score cannot be calculated because patient has a medical history suggesting prior/existing ASCVD    Assessment & Plan:   Moderate vascular dementia without behavioral disturbance, psychotic disturbance, mood disturbance, or anxiety [facility]) Assessment & Plan: Follow with Dr. Rosemarie,  neurologist. Stable. Continue Namenda  and donepezil .    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  Neurologist previously agreed that dabigatrin would be an appropriate alternative.  In the meantime, continue Eliquis  5 mg daily.    Hyperlipidemia, unspecified hyperlipidemia type Assessment & Plan: Last lipid panel: LDL 56, HDL 57, Trig 95. Stable and within goal. CMP WNL. Continue atorvastatin  80 mg daily.   Hypothyroidism, unspecified type Assessment & Plan: Most recent TSH within normal limits at 2.8. Continue levothyroxine  125 mcg daily.   Essential hypertension Assessment & Plan: BP goal <130/80. BP well within goal and reported episodes of hypotension on ambulatory BP monitoring. Will decrease irebesartan from 75 mg tablet to half tablet (37.5 mg) daily. Encouraged to continue ambulatory BP monitoring and notify office of any persistently high/low readings. Will cont to monitor.     Return in about 27 weeks (around 11/06/2024) for HTN, HLD, thyroid , pre-DM.    Saddie JULIANNA Sacks, PA-C

## 2024-05-01 NOTE — Assessment & Plan Note (Signed)
 Last lipid panel: LDL 56, HDL 57, Trig 95. Stable and within goal. CMP WNL. Continue atorvastatin  80 mg daily.

## 2024-05-01 NOTE — Assessment & Plan Note (Signed)
 Follow with Dr. Rosemarie, neurologist. Stable. Continue Namenda  and donepezil .

## 2024-05-01 NOTE — Assessment & Plan Note (Signed)
 Currently taking Eliquis 5 mg daily.  Neurologist previously agreed that dabigatrin would be an appropriate alternative.  In the meantime, continue Eliquis 5 mg daily.

## 2024-05-01 NOTE — Assessment & Plan Note (Signed)
 Most recent TSH within normal limits at 2.8. Continue levothyroxine  125 mcg daily.

## 2024-05-01 NOTE — Patient Instructions (Signed)
 It was nice to see you today!  As we discussed in clinic:  -All of your lab work looked great! We will plan to continue all of your current medications as prescribed.  -Let's plan to split the blood pressure medication (Irbesartan ) in half and see if blood pressure readings improve. The goal for good blood pressure is anywhere between 110-130 over 70-80.  -I will plan to see you back in 6 months for a follow up with labs the week before!  If you have any problems before your next visit feel free to message me via MyChart (minor issues or questions) or call the office, otherwise you may reach out to schedule an office visit.  Thank you! Saddie Sacks, PA-C

## 2024-05-01 NOTE — Assessment & Plan Note (Signed)
 BP goal <130/80. BP well within goal and reported episodes of hypotension on ambulatory BP monitoring. Will decrease irebesartan from 75 mg tablet to half tablet (37.5 mg) daily. Encouraged to continue ambulatory BP monitoring and notify office of any persistently high/low readings. Will cont to monitor.

## 2024-05-28 ENCOUNTER — Other Ambulatory Visit: Payer: Self-pay | Admitting: Family Medicine

## 2024-05-28 DIAGNOSIS — I82412 Acute embolism and thrombosis of left femoral vein: Secondary | ICD-10-CM

## 2024-06-07 NOTE — Progress Notes (Signed)
   06/07/2024  Patient ID: Caroline [name], female   DOB: [birth year], 88 y.o.   MRN: [id number]  Pharmacy Quality Measure Review  This patient is appearing on a report for being at risk of failing the adherence measure for hypertension (ACEi/ARB) medications this calendar year.   Medication: irbesartan  75mg  once daily  Last fill date: 03/22/24 90ds  Insurance report was not up to date. No action needed at this time.   Lang Sieve, PharmD, BCGP Clinical Pharmacist  [phone]

## 2024-06-11 ENCOUNTER — Encounter: Payer: Self-pay | Admitting: Family Medicine

## 2024-06-11 ENCOUNTER — Ambulatory Visit (INDEPENDENT_AMBULATORY_CARE_PROVIDER_SITE_OTHER): Payer: Medicare (Managed Care) | Admitting: Family Medicine

## 2024-06-11 ENCOUNTER — Ambulatory Visit
Admission: RE | Admit: 2024-06-11 | Discharge: 2024-06-11 | Disposition: A | Discharge status: Home / Self Care | Payer: Medicare (Managed Care) | Source: Ambulatory Visit | Attending: Family Medicine | Admitting: Family Medicine

## 2024-06-11 ENCOUNTER — Ambulatory Visit: Payer: Self-pay | Admitting: Family Medicine

## 2024-06-11 VITALS — BP 138/68 | HR 67 | Ht 63.0 in | Wt 144.8 lb

## 2024-06-11 DIAGNOSIS — S0990XA Unspecified injury of head, initial encounter: Secondary | ICD-10-CM

## 2024-06-11 DIAGNOSIS — S0003XA Contusion of scalp, initial encounter: Secondary | ICD-10-CM | POA: Diagnosis not present

## 2024-06-11 NOTE — Patient Instructions (Signed)
 It was nice to see you today,  We addressed the following topics today: -I am ordering a CT scan of your head.  This is because you are on blood thinners and want to rule out a intracranial bleed.  If it is normal we can continue taking your medication. - I would like you to hold this evening's dose of Eliquis  until we can get the results of your CT scan back.  This is just in case there is a bleed, although I do not think there will be. - You can put Vaseline over the cut, but that should heal on its own.  It does not look infected at this time.  Have a great day,  Rolan Slain, MD

## 2024-06-11 NOTE — Progress Notes (Unsigned)
   Acute Office Visit  Subjective:     Patient ID: Caroline [name], female    DOB: 09-May-1936, 88 y.o.   MRN: [id number]  No chief complaint on file.   HPI Patient is in today for   Subjective - Fell on Saturday night, 06/09/2024. Hit head on a wooden bed frame. Sustained a small cut on the forehead. Also reports bruising on the right knee and elbow. - Slept and ate well since the fall. No confusion reported by caregiver immediately after the fall.  Medications: Eliquis .  PMH, PSH, FH, Social Hx: No new history discussed.  ROS: No confusion, vision changes, slurred speech, or other neurologic changes reported since the fall. Reports soreness over the right knee. Left knee is not sore. Reports ability to walk and go upstairs without issue.  Objective NEURO: Cranial nerves grossly intact on facial examination. Able to smile, puff cheeks, and squeeze eyes tightly. Alert and oriented. SKIN: Small, non-infected laceration noted on the forehead.  Assessment and Plan 1. Head injury after fall on anticoagulant - History of fall on 06/09/2024 with head impact while on Eliquis . No significant neurologic symptoms reported. Forehead laceration appears to be healing well. Advised on the increased risk of intracranial hemorrhage when on a blood thinner and recommended evaluation for any head injury. - Plan: - Order stat CT scan of the head to rule out intracranial bleed. - Counseled to seek emergency care for any neurologic changes, such as confusion, vision changes, or signs of a stroke.  2. Forehead laceration - Small, clean-appearing laceration on the forehead sustained during the fall. - Plan: - Continue to apply antibiotic cream or Vaseline to create a barrier and prevent infection. - Advised to leave the wound uncovered and avoid picking at it.  3. Right knee and elbow contusion - Reports soreness over the right knee and bruising on the elbow from the fall. - Plan: - Continue to  monitor.  ROS      Objective:    There were no vitals taken for this visit. {Vitals History (Optional):23777}  Physical Exam  No results found for any visits on 06/11/24.      Assessment & Plan:   There are no diagnoses linked to this encounter.   No follow-ups on file.  Toribio MARLA Slain, MD

## 2024-06-12 ENCOUNTER — Ambulatory Visit: Payer: Medicare (Managed Care) | Admitting: Podiatry

## 2024-06-15 DIAGNOSIS — S0990XA Unspecified injury of head, initial encounter: Secondary | ICD-10-CM | POA: Insufficient documentation

## 2024-06-15 NOTE — Assessment & Plan Note (Addendum)
-   History of fall on 06/09/2024 with head impact while on Eliquis . No significant neurologic symptoms reported. Forehead laceration appears to be healing well. Advised on the increased risk of intracranial hemorrhage when on a blood thinner and recommended evaluation for any head injury. - Plan: - Order stat CT scan of the head to rule out intracranial bleed. - Counseled to seek emergency care for any neurologic changes, such as confusion, vision changes, or signs of a stroke. - Continue to apply antibiotic cream or Vaseline to create a barrier and prevent infection. - Advised to leave the wound uncovered and avoid picking at it.

## 2024-06-21 ENCOUNTER — Encounter: Payer: Self-pay | Admitting: Podiatry

## 2024-06-21 ENCOUNTER — Ambulatory Visit: Payer: Medicare (Managed Care) | Admitting: Podiatry

## 2024-06-21 DIAGNOSIS — M79674 Pain in right toe(s): Secondary | ICD-10-CM | POA: Diagnosis not present

## 2024-06-21 DIAGNOSIS — D689 Coagulation defect, unspecified: Secondary | ICD-10-CM | POA: Diagnosis not present

## 2024-06-21 DIAGNOSIS — M79675 Pain in left toe(s): Secondary | ICD-10-CM | POA: Diagnosis not present

## 2024-06-21 DIAGNOSIS — B351 Tinea unguium: Secondary | ICD-10-CM

## 2024-06-21 NOTE — Progress Notes (Signed)
 This patient returns to my office for at risk foot care.  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  This patient states she is having pain in the outside border right big toe.  The toe is painful walking and wearing her shoes.  She presents to the office with her husband.  This patient presents for at risk foot care today.  General Appearance  Alert, conversant and in no acute stress.  Vascular  Dorsalis pedis and posterior tibial  pulses are  weakly palpable  bilaterally.  Capillary return is within normal limits  bilaterally. Temperature is within normal limits  bilaterally.  Neurologic  Senn-Weinstein monofilament wire test within normal limits  bilaterally. Muscle power within normal limits bilaterally.  Nails Thick disfigured discolored nails with subungual debris  hallux  nails bilaterally. No evidence of bacterial infection or drainage bilaterally.  Orthopedic  No limitations of motion  feet .  No crepitus or effusions noted.  No bony pathology or digital deformities noted.  Skin  normotropic skin with no porokeratosis noted bilaterally.  No signs of infections or ulcers noted.     Paronychia right great toenail.  Pain in right toes  Pain in left toes  Consent was obtained for treatment procedures.   Mechanical debridement of nails 1-5  bilaterally performed with a nail nipper.  Filed with dremel without incident. Cleaned lateral border right hallux and no pus is noted.   Return office visit    9 weeks.                 Told patient to return for periodic foot care and evaluation due to potential at risk complications.   Cordella Bold DPM

## 2024-06-25 ENCOUNTER — Other Ambulatory Visit: Payer: Self-pay | Admitting: Family Medicine

## 2024-06-25 DIAGNOSIS — I10 Essential (primary) hypertension: Secondary | ICD-10-CM

## 2024-07-16 ENCOUNTER — Other Ambulatory Visit: Payer: Self-pay | Admitting: Family Medicine

## 2024-07-16 ENCOUNTER — Encounter: Payer: Self-pay | Admitting: Family Medicine

## 2024-07-16 DIAGNOSIS — E039 Hypothyroidism, unspecified: Secondary | ICD-10-CM

## 2024-07-16 DIAGNOSIS — K219 Gastro-esophageal reflux disease without esophagitis: Secondary | ICD-10-CM

## 2024-07-17 ENCOUNTER — Ambulatory Visit: Payer: Medicare (Managed Care)

## 2024-07-30 ENCOUNTER — Telehealth: Payer: Self-pay

## 2024-07-30 NOTE — Progress Notes (Unsigned)
 [facility] [address] [place]. [place] [address] [phone]       OFFICE FOLLOW UP VISIT NOTE  Ms. Shellsea Borunda Date of Birth:  04/04/1936 Medical Record Number:  [id number]   Primary neurologist: Dr. Rosemarie Reason for visit: hx of stroke, cognitive impairment  No chief complaint on file.    HPI:  Update 07/31/2024 JM: Patient returns for 54-month follow-up visit.   Remains on Aricept  and Namenda  without side effects   She did have a fall in September with head strike with current use of Eliquis , PCP ordered stat CT scan of head which was negative for intercranial abnormalities, did show mild left frontal scalp hematoma.         History provided for reference purposes only Update 01/09/2024 JM: Patient returns for sooner scheduled visit due to recent increased confusion, visual hallucinations and night terrors.  Was seen by Dr. Perri 4/4 and dx'd with UTI, on Cipro , husband reports improvement of symptoms since treatment.  He is frustrated that he was previously seen by PCP in January and requested complaining of UA on urine sample provided but he reports PCP refused to complete., reviewed PCP note from 10/10/2023, UA not completed as patient asymptomatic and remained on prophylactic trimothoprim as advised by [facility] therefore no indication to screen for asymptomatic bacteriuria. Typical UTI symptoms are change in mental status, typically does not have dysuria or hematuria. She is scheduled for yearly urology follow-up in May but husband plans on seeing if sooner visit can be scheduled as they are leaving for a cruise at the end of this month.  Prior to UTI onset, he reports memory overall stable.  MMSE today 17/30 (prior 20/30).  Can have occasional sundowning behaviors but he is unsure if this was related to UTI.  Remains on Aricept  and Namenda .  Continues to ambulate with a RW, denies any recent falls. Continued post stroke right hemianopia,  unchanged, no new stroke/TIA symptoms.  Remains on Eliquis  and atorvastatin , no side effects.  Routinely follows with PCP for stroke risk factor management.   Update 08/10/2023 JM: Patient returns for follow-up visit accompanied by her husband who provides majority of history. At prior visit with Amy, NP, recommended continuation of Aricept  and initiated memantine .   Reports cognition has been stable since prior visit. Continues to struggle more with short term memory but this is relatively unchanged since prior visit.  Continues on donepezil  10 mg daily and memantine  5 mg twice daily, denies side effects. Prior side effects with increase of chronic diarrhea but this has since improved and only needs Imodium occasionally. Can become frustrated at times, usually due to disagreements with husband.  No significant behavioral concerns. Continues on quetiapine  25 mg nightly managed by PCP. Reports good appetite. Use of OTC sleep aide with benefit. Husband ensure she stays active during the day. Use of RW, no recent falls.  Husband assist with majority of ADLs. Denies new stroke/TIA symptoms.  Right peripheral vision loss unchanged.  Previously followed by Dr. Roz ophthalmology but per husband, office recently closed and needs to find new ophthalmologist.  He would be interested in being evaluated by neuro-ophthalmology.  Remains on Eliquis  and atorvastatin .  Was previously looking at transitioning off Eliquis  to Pradaxa  due to cost but no significant cost different on Pradaxa  therefore continued on Eliquis .  Routinely follows with PCP for stroke risk factor management.  Update 01/25/23 [facility]: Marlaysia Lenig is a 88 y.o. 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 She was last seen by Harlene 07/2022. MMSE 20/30. She reported difficulty tolerating donepezil  d/t sleepiness and chronic diarrhea. She was switched to Aldarity which was not covered. She was started on  rivastigmine . Side effects of insomnia and worsening confusion after first dose reported. It took three days to return to baseline. Aldarity offered but we did not hear back from family. They have continued donepezil  10mg  daily after breakfast. She is also taking quetiapine  25mg  QHS followed by PCP.    She presents with her husband, Woodrow, who aids in history. She seems to be tolerating donepezil  taken with breakfast. Mr Prigmore has noticed more sleepiness about 2 hours after taking donepezil . He feels that short term memory may be a little worse but overall she seems fairly stable. Some days are better than others. She needs assistance with most ADLs. She uses her walker but likes to lean forward and walk behind walker. No falls. Diarrhea seems to be a little better. Certain foods and antibiotic therapy seem to make it worse. Imodium PRN helps. She does have difficulty with delusions. She feels that her children are still younger, school aged. They are grown adults. She asks repetitively to go home even though she has lived her her current home for 50 years. No visual or auditory hallucinations. She is finishing treatment for UTI. She is sleeping well. She continues to have vivid dreams but doesn't seem overly bothered by these. No longer using sleep aids.    She continues Eliquis  and atorvastatin  for stroke prevention. No known TIA/stroke symptoms since last visit. She is followed closely by PCP.   Update 06/30/2022 JM: Patient returns for follow-up visit after prior visit 4 months ago accompanied by her husband.  Overall stable from stroke standpoint without new stroke/TIA symptoms.  Residual visual impairment, stable since prior visit. Also mentions continued cognitive impairment more so in regards to short-term memory difficulties, at times will not recognize her husband or believes her husband is her mother, at times have confusions and nonthreatening hallucinations but has also been dealing with  recurrent UTIs and has been unsure if these have been contributing.  At times can be agitated or irritable but no violent behavior or any other behavioral concerns.  He believes overall her cognition has been stable without any significant worsening since prior visit.  She has remained on Aricept  10 mg daily, was initially taking at night but was having night terrors and talking in her sleep therefore he switched to daytime but can cause some drowsiness.  She is also been having ongoing issues with diarrhea.  Compliant on Eliquis  and atorvastatin , denies side effects.  Blood pressure typically well controlled.  Closely follows with PCP.  No further concerns at this time.  Update 02/04/2022 Dr. Rosemarie: She returns for follow-up after last visit 2 months ago.  She is accompanied by husband and states that she has good days and bad days.  She was from gets disoriented and confused in time she does not recognize even her husband and family members.  She is often talking to people who are dead.  I had started the patient on Namenda  which she stopped it a month ago as her husband did not notice any benefit.  She is he has also not noticed that she is any worse after stopping the medicine.  Patient is UTI seem to be much better since she has been started on Macrodantin  50 mg which she takes every  other day.  She does get occasionally agitated and irritable but there is no violent behavior.  She does not wander off.  She does sleep quite well and eats well.  She has not tried Aricept  in the past and is willing to try it.  On Mini-Mental status exam today she scored 16/30.  Clock drawing 3/4.  Update 12/09/2021 Dr. Rosemarie: She returns for follow-up after last visit 4 months ago.  She is accompanied by her husband.  Patient continues to have short-term memory difficulties as well as at times difficulty finding her way around her own home.  She gets disoriented and confused off-and-on.  He has had recurrent bouts of UTIs which  have been treated by her primary care physician is currently on Macrobid .  Se had an episode of severe dizziness on 12/04/2021 and was taken to the emergency room by EMS blood pressure was found to be significantly elevated.  An MRI scan of the brain was obtained which I personally reviewed did not show any acute abnormality and showed old left PCA infarct.  She continues to ambulate with a walker and history of pulmonary no falls injuries.  She is tolerating Eliquis  well without bleeding or bruising..  Lab work on 10/22/2021 showed LDL cholesterol to be near optimal at 73 hemoglobin A1c at 5.7.  Initial visit 08/05/2021 Dr. Rosemarie: Ms. Negrete is a 88 year old pleasant Caucasian lady seen for initial office consultation visit for stroke.  She is accompanied by her daughter and husband.  History is obtained from them and review of electronic medical records and I personally reviewed pertinent available imaging films in PACS.  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  She presented on 06/19/2021 on postoperative day 1 for confusion, delirium and lethargy.  This was initially felt to be blood pressure and he was taken to the hospital by EMS.  He had an MRI scan of the brain personally reviewed.  Shows old left cerebellar infarct.  Continues to have peripheral vision loss, stroke improved.  Continues to walk with a walker, but no falls or injuries.  She is tolerating well without bruising or bleeding.  Blood pressure is under good control.  Tolerating Lipitor  without muscle aches and.  .  She was also felt to have underlying low-grade dementia which was exacerbated by the surgery.  Her symptoms continued for couple of days and noncontrast CT scan of the head was obtained which showed acute left posterior cerebral artery stroke.  Her NIH was 4 on  admission she is not a candidate for tPA due to recent surgery.  MRI scan was subsequently obtained which showed a left posterior cerebral artery infarct and MRA showed left P2 occlusion.  2D echo showed ejection fraction of 60 to 65% without cardiac source of embolism.  LDL cholesterol was elevated at 123 mg percent and hemoglobin A1c was 5.5.  She was initially placed on dual antiplatelet therapy but subsequently on 07/30/2021 she was found to have a left common femoral vein deep vein thrombosis for which she has subsequently been switched to Xarelto  though aspirin  has also been continued pending my opinion today.  Patient continues to have right-sided peripheral vision loss.  She has been ambulating with a walker and has been careful.  She has had no falls or injuries.  The family has noticed worsening of her cognitive difficulties with patient having  more frequent sundowning for which she has been started on Seroquel  25 mg at night which helps her sleep well.  There are no delusions or hallucinations during the day.  She has more short-term memory difficulties and intermittent confusion which is new.  She does have 24-hour care at home and cannot be left alone.     ROS:   14 system review of systems is positive for those listed in HPI and all other systems negative  PMH:  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 Cheek   Squamous  cell carcinoma in situ (SCCIS) 08/02/2018   Bridge Left Nose, and Left Neck   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 Cheek Tx: curret x 3 and 5FU   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   Bridge of nose tx Cx3 and 5FU, Left neck Tx Cx3 and 5FU   Thrombophlebitis     Social History:  Social History   Socioeconomic History   Marital status: Married    Spouse name: [name]   Number of children: 3   Years of education: 12   Highest education level: Not on file  Occupational History   Occupation: retired  Tobacco Use   Smoking status: Never    Passive exposure: Never   Smokeless tobacco: Never  Vaping Use   Vaping status: Never Used  Substance and Sexual Activity   Alcohol  use: No   Drug use: Not Currently   Sexual activity: Never  Other Topics Concern   Not on file  Social History Narrative   Lives with husband   Right Handed   Drinks no caffeine   Social Drivers of Corporate Investment Banker Strain: Low Risk  (09/06/2023)   Overall Financial Resource Strain (CARDIA)    Difficulty of Paying Living Expenses: Not hard at all  Food Insecurity: No Food Insecurity (09/06/2023)   Hunger Vital Sign    Worried About Running Out of Food in the Last Year: Never true    Ran Out of Food in the Last Year: Never true  Transportation Needs: No Transportation Needs (09/06/2023)   PRAPARE - Administrator, Civil Service (Medical): No    Lack of Transportation (Non-Medical): No  Physical Activity: Sufficiently Active (09/06/2023)   Exercise Vital Sign    Days of Exercise per Week: 3 days    Minutes of Exercise per Session: 60 min  Stress: No Stress Concern Present (09/06/2023)   [employer] of Occupational Health - Occupational Stress Questionnaire    Feeling of Stress : Not at all  Social Connections: Moderately Isolated (09/06/2023)   Social Connection and Isolation Panel    Frequency of Communication with Friends and Family: More  than three times a week    Frequency of  Social Gatherings with Friends and Family: Not on file    Attends Religious Services: Never    Database Administrator or Organizations: No    Attends Banker Meetings: Never    Marital Status: Married  Catering Manager Violence: Not At Risk (09/06/2023)   Humiliation, Afraid, Rape, and Kick questionnaire    Fear of Current or Ex-Partner: No    Emotionally Abused: No    Physically Abused: No    Sexually Abused: No    Medications:   Current Outpatient Medications on File Prior to Visit  Medication Sig Dispense Refill   acetaminophen  (TYLENOL ) 325 MG tablet Take 2 tablets (650 mg total) by mouth every 6 (six) hours as needed for mild pain, moderate pain, fever or headache.     Ascorbic Acid (VITAMIN C ) 1000 MG tablet Take 1,000 mg by mouth daily.     atorvastatin  (LIPITOR ) 80 MG tablet TAKE 1 TABLET (80 MG TOTAL) BY MOUTH AT BEDTIME. 90 tablet 3   calcium  carbonate ([employer] CALCIUM  CARBONATE) 1250 (500 Ca) MG tablet Take 2 tablets (2,500 mg total) by mouth daily. 30 tablet 0   cholecalciferol  (VITAMIN D3) 25 MCG (1000 UNIT) tablet Take 0.5 tablets (500 Units total) by mouth daily.     ciprofloxacin  (CIPRO ) 250 MG tablet Take 1 tablet (250 mg total) by mouth 2 (two) times daily. 14 tablet 0   donepezil  (ARICEPT ) 10 MG tablet Take 1 tablet (10 mg total) by mouth daily. 90 tablet 3   econazole nitrate  1 % cream Apply topically daily. Use on affected areas under breast twice a day until clear when flared. 15 g 0   ELIQUIS  5 MG TABS tablet TAKE 1 TABLET BY MOUTH 2 TIMES DAILY. 180 tablet 1   estradiol  (ESTRACE ) 0.1 MG/GM vaginal cream Apply a pea-sized amount to fingertip or Q-tip and wipe in vaginal roof twice weekly 42.5 g 0   furosemide  (LASIX ) 20 MG tablet Take 1 tablet (20 mg total) by mouth daily as needed for edema. 90 tablet 0   irbesartan  (AVAPRO ) 75 MG tablet TAKE 1 TABLET (75 MG TOTAL) BY MOUTH DAILY. 90 tablet 1   levothyroxine   (SYNTHROID ) 125 MCG tablet TAKE 1 TABLET (125 MCG TOTAL) BY MOUTH DAILY AT 6 AM. 90 tablet 1   meclizine  (ANTIVERT ) 25 MG tablet Take 0.5 tablets (12.5 mg total) by mouth 3 (three) times daily as needed for dizziness. 30 tablet 0   memantine  (NAMENDA ) 5 MG tablet Take 1 tablet (5 mg total) by mouth 2 (two) times daily. 180 tablet 3   ondansetron  (ZOFRAN ) 4 MG tablet Take 1 tablet (4 mg total) by mouth every 6 (six) hours. 12 tablet 0   pantoprazole  (PROTONIX ) 40 MG tablet TAKE 1 TABLET BY MOUTH DAILY 90 tablet 1   QUEtiapine  (SEROQUEL ) 25 MG tablet TAKE 1 TABLET (25 MG TOTAL) BY MOUTH AT BEDTIME. 90 tablet 1   traMADol  (ULTRAM ) 50 MG tablet Take 50 mg by mouth 2 (two) times daily as needed.     trimethoprim  (TRIMPEX ) 100 MG tablet Take 1 tablet (100 mg total) by mouth daily. 30 tablet 3   vitamin B-12 (CYANOCOBALAMIN ) 250 MCG tablet Take 1 tablet (250 mcg total) by mouth daily. 30 tablet 0   No current facility-administered medications on file prior to visit.    Allergies:   Allergies  Allergen Reactions   Gluten Meal Nausea And Vomiting    Patient has CELIAC DISEASE   Wheat Nausea And Vomiting  Patient has CELIAC DISEASE   Adhesive [Tape] Other (See Comments)    Patient's skin is VERY THIN and tears very easily; please use either paper tape or Coban wrap   Codeine Nausea And Vomiting and Other (See Comments)    other   Other Other (See Comments)    Patient has Hypoglycemia; her husband stated when her sugar crashes, it crashes hard.   Penicillins Other (See Comments)    Doesn't remember reaction , but denies any SOB/Swelling States  it was a long time ago      Physical Exam There were no vitals filed for this visit.   There is no height or weight on file to calculate BMI.  General: Frail very pleasant elderly Caucasian lady, seated, in no evident distress  Neurologic Exam Mental Status: Awake and fully alert.  Fluent speech and language.  Follows commands without  difficulty.  Recent and remote memory impaired. Attention span, concentration and fund of knowledge impaired. Mood and affect appropriate.  Cranial Nerves: Pupils equal, briskly reactive to light. Extraocular movements full without nystagmus. Visual fields show dense right homonymous hemianopsia to confrontation. Hearing intact. Facial sensation intact. Face, tongue, palate moves normally and symmetrically.  Motor: Normal bulk and tone. Normal strength in all tested extremity muscles. Sensory.: intact to touch , pinprick , position and vibratory sensation.  Coordination: Rapid alternating movements normal in all extremities. Finger-to-nose and heel-to-shin performed accurately bilaterally. Gait and Station: Arises from chair without difficulty.  stance is stooped. . Gait demonstrates slight imbalance with use of rolling walker.  Not able to heel, toe and tandem walk  Reflexes: 1+ and symmetric. Toes downgoing.      01/09/2024    8:17 AM 08/10/2023   11:13 AM 01/25/2023    9:15 AM 06/30/2022    2:13 PM 02/04/2022   10:55 AM 11/24/2020   11:28 AM  MMSE - Mini Mental State Exam  Orientation to time 0 1 0 1 0 5  Orientation to Place 4 3 4 5 4 5   Registration 3 3 3 3 3 3   Attention/ Calculation 2 5 1 5 1 3   Recall 0 1 1 0 0 2  Language- name 2 objects 2 2 2 2 2 2   Language- repeat 1 1 1 1 1 1   Language- follow 3 step command 3 2 3 1 3 3   Language- read & follow direction 1 1 1 1 1 1   Write a sentence 1 1 1 1 1 1   Copy design 0 0 1 0 0 0  Total score 17 20 18 20 16 26          ASSESSMENT/PLAN: 88 year old Caucasian lady with left posterior cerebral artery infarction secondary to left P2 occlusion in 06/2021 with significant residual right-sided homonymous hemianopsia as well as poststroke vascular dementia.  Multiple vascular risk factors of hypertension , hyperlipidemia and age. Hx of DVT on Eliquis .      Left PCA stroke -Continue Eliquis  (hx of DVT) and atorvastatin  for secondary stroke  prevention measures managed/prescribed by PCP  -Continue close PCP follow-up for aggressive stroke risk factor management including BP goal<130/90, and HLD with LDL goal<70  -Stroke labs 87/7975: LDL 62, A1c 5.6   Mild vascular dementia Age-related cognitive impairment -MMSE today 17/30 (prior 20/30) -Continue donepezil  10 mg daily and memantine  5 mg twice daily - can consider dosage increase of memantine  in the future if needed but as memory stable, will continue on current dosage  -discussed memory compensation strategies and  advised her to increase participation in cognitively challenging activities like solving crossword puzzles, playing bridge and sodoku  Recurrent UTIs - Typical symptoms consist of altered mental status like causing recent hallucinations - Currently being treated for UTI, had visual hallucinations and night terrors which have been gradually improving since initiating Cipro  last week -remains on prophylactic treatment and is routinely followed with [facility]     Follow-up in 6 months or call earlier if needed      Harlene Bogaert, [facility]  [facility] [address] [place], [place] [address]  Phone [phone] Fax [phone] Note: This document was prepared with digital dictation and possible smart phrase technology. Any transcriptional errors that result from this process are unintentional.

## 2024-07-30 NOTE — Telephone Encounter (Signed)
 Pt's husband LVM on the triage line stating pt took an at home UA test and it was positive.   Returned pt's husband called. He states pt does not usually have symptoms but she never does when she has a UTI. Pt's husband was advise to call PCP since we have no availability this week. Pt's husband states he called their Pcp and was advise to call us . Scheduled for next available which is next Monday. Pt's husband aware.

## 2024-07-31 ENCOUNTER — Ambulatory Visit: Payer: Medicare (Managed Care) | Admitting: Adult Health

## 2024-07-31 ENCOUNTER — Encounter: Payer: Self-pay | Admitting: Adult Health

## 2024-07-31 ENCOUNTER — Ambulatory Visit
Admission: EM | Admit: 2024-07-31 | Discharge: 2024-07-31 | Disposition: A | Discharge status: Home / Self Care | Payer: Medicare (Managed Care) | Attending: Emergency Medicine | Admitting: Emergency Medicine

## 2024-07-31 ENCOUNTER — Other Ambulatory Visit: Payer: Self-pay

## 2024-07-31 ENCOUNTER — Telehealth: Payer: Self-pay | Admitting: Adult Health

## 2024-07-31 VITALS — BP 121/65 | HR 81 | Temp 97.3°F | Resp 18

## 2024-07-31 VITALS — BP 122/63 | HR 75 | Ht 63.0 in | Wt 143.0 lb

## 2024-07-31 DIAGNOSIS — I63532 Cerebral infarction due to unspecified occlusion or stenosis of left posterior cerebral artery: Secondary | ICD-10-CM | POA: Diagnosis not present

## 2024-07-31 DIAGNOSIS — N3001 Acute cystitis with hematuria: Secondary | ICD-10-CM | POA: Insufficient documentation

## 2024-07-31 DIAGNOSIS — F05 Delirium due to known physiological condition: Secondary | ICD-10-CM

## 2024-07-31 DIAGNOSIS — F01B3 Vascular dementia, moderate, with mood disturbance: Secondary | ICD-10-CM

## 2024-07-31 LAB — POCT URINE DIPSTICK
Bilirubin, UA: NEGATIVE
Glucose, UA: NEGATIVE mg/dL
Ketones, POC UA: NEGATIVE mg/dL
Nitrite, UA: POSITIVE — AB
POC PROTEIN,UA: NEGATIVE
Spec Grav, UA: 1.02 (ref 1.010–1.025)
Urobilinogen, UA: 0.2 U/dL
pH, UA: 5.5 (ref 5.0–8.0)

## 2024-07-31 MED ORDER — CIPROFLOXACIN HCL 250 MG PO TABS: 250.0000 mg | ORAL_TABLET | Freq: Two times a day (BID) | ORAL | 0 refills | Status: AC

## 2024-07-31 MED ORDER — QUETIAPINE FUMARATE 25 MG PO TABS: ORAL_TABLET | ORAL | 11 refills | Status: DC

## 2024-07-31 NOTE — ED Provider Notes (Signed)
 [facility]    CSN: [id number] Arrival date & time: 07/31/24  1353      History   Chief Complaint Chief Complaint  Patient presents with   Recurrent UTI    HPI Caroline [name] is a 88 y.o. female.  Here with spouse Patient has history of recurrent UTI, followed by urology. She is on trimethoprim  daily for prevention.  Wears urinary pads Spouse would like her to be checked for a UTI. States he has an intuition when she has one. Maybe some stronger urine odor. Patient without dysuria, hematuria, urgency, itching. Has not complained of abdominal or flank pain. No fevers. Baseline appetite and fluid intake. She has baseline confusion post-strokes. Husband states no increased confusion.  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 Cheek   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   Bridge Left Nose, and Left Neck   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 Cheek Tx: curret x 3 and 5FU   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   Bridge of nose tx Cx3 and 5FU, Left neck Tx Cx3 and 5FU   Thrombophlebitis     Patient Active Problem List   Diagnosis Date Noted   Head injury 06/15/2024   Moderate vascular dementia without behavioral disturbance, psychotic disturbance, mood disturbance, or anxiety (HCC) 05/01/2024   Paronychia of great toe 10/31/2023   Neoplasm of uncertain  behavior of skin of shoulder 02/03/2023   Impaired fasting glucose 07/25/2022   Functional diarrhea 06/27/2022   Chronic pain of both knees 01/24/2022   Urinary incontinence 11/29/2021   Multiple pulmonary nodules 11/29/2021   Hypertrophic toenail 11/29/2021   Confusion 10/25/2021   Vitamin B12 deficiency 10/25/2021   DVT (deep venous thrombosis) (HCC) 07/30/2021   Dysphagia, post-stroke    Hypoalbuminemia due to protein-calorie malnutrition    Delirium due to multiple etiologies 06/17/2021   S/P repair of paraesophageal hernia 06/15/2021   Hypothyroidism 02/08/2018   Essential hypertension 02/08/2018   Frequent UTI 02/08/2018   Hyperlipidemia     Past Surgical History:  Procedure Laterality Date   ABDOMINAL HYSTERECTOMY  1979   CATARACT EXTRACTION, BILATERAL     knee surgery Left    VARICOSE VEIN SURGERY Left    XI ROBOTIC ASSISTED PARAESOPHAGEAL HERNIA REPAIR N/A 06/15/2021   Procedure: XI ROBOTIC ASSISTED PARAESOPHAGEAL HERNIA REPAIR, GASTROPEXY, UPPER ENDOSCOPY;  Surgeon: Signe Mitzie LABOR, MD;  Location: [facility];  Service: General;  Laterality: N/A;    OB History   No obstetric history on file.      Home Medications    Prior to Admission medications   Medication Sig Start Date End Date Taking? Authorizing Provider  acetaminophen  (TYLENOL ) 325 MG tablet Take 2 tablets (650 mg total) by mouth every 6 (six)  hours as needed for mild pain, moderate pain, fever or headache. 07/02/21   Angiulli, Toribio PARAS, PA-C  Ascorbic Acid (VITAMIN C ) 1000 MG tablet Take 1,000 mg by mouth daily.    [provider]  atorvastatin  (LIPITOR ) 80 MG tablet TAKE 1 TABLET (80 MG TOTAL) BY MOUTH AT BEDTIME. 02/13/24   Chandra Toribio POUR, MD  calcium  carbonate ([employer]S CALCIUM  CARBONATE) 1250 (500 Ca) MG tablet Take 2 tablets (2,500 mg total) by mouth daily. 06/27/22   Hanford Powell BRAVO, NP  cholecalciferol  (VITAMIN D3) 25 MCG (1000 UNIT) tablet Take 0.5 tablets (500 Units total) by mouth daily.  06/27/22   Hanford Powell BRAVO, NP  ciprofloxacin  (CIPRO ) 250 MG tablet Take 1 tablet (250 mg total) by mouth 2 (two) times daily for 7 days. 07/31/24 08/07/24  Blakley Michna, Asberry, PA-C  donepezil  (ARICEPT ) 10 MG tablet Take 1 tablet (10 mg total) by mouth daily. 08/10/23   Whitfield Raisin, NP  econazole nitrate  1 % cream Apply topically daily. Use on affected areas under breast twice a day until clear when flared. 09/08/23   Alm Delon SAILOR, DO  ELIQUIS  5 MG TABS tablet TAKE 1 TABLET BY MOUTH 2 TIMES DAILY. 05/28/24   Gayle Saddie FALCON, PA-C  estradiol  (ESTRACE ) 0.1 MG/GM vaginal cream Apply a pea-sized amount to fingertip or Q-tip and wipe in vaginal roof twice weekly 04/19/24   Stoioff, Glendia BROCKS, MD  furosemide  (LASIX ) 20 MG tablet Take 1 tablet (20 mg total) by mouth daily as needed for edema. 07/28/21   Abonza, Maritza, PA-C  irbesartan  (AVAPRO ) 75 MG tablet TAKE 1 TABLET (75 MG TOTAL) BY MOUTH DAILY. 06/26/24   Gayle Saddie FALCON, PA-C  levothyroxine  (SYNTHROID ) 125 MCG tablet TAKE 1 TABLET (125 MCG TOTAL) BY MOUTH DAILY AT 6 AM. 07/16/24   Clapp, Kara F, PA-C  meclizine  (ANTIVERT ) 25 MG tablet Take 0.5 tablets (12.5 mg total) by mouth 3 (three) times daily as needed for dizziness. 12/04/21   Autry, Lauren E, PA-C  memantine  (NAMENDA ) 5 MG tablet Take 1 tablet (5 mg total) by mouth 2 (two) times daily. 08/10/23   Whitfield Raisin, NP  ondansetron  (ZOFRAN ) 4 MG tablet Take 1 tablet (4 mg total) by mouth every 6 (six) hours. 12/04/21   Autry, Lauren E, PA-C  pantoprazole  (PROTONIX ) 40 MG tablet TAKE 1 TABLET BY MOUTH DAILY 07/16/24   Gayle Saddie F, PA-C  QUEtiapine  (SEROQUEL ) 25 MG tablet Take 0.5 tablets (12.5 mg total) by mouth daily in the afternoon AND 1 tablet (25 mg total) at bedtime. 07/31/24   Whitfield Raisin, NP  traMADol  (ULTRAM ) 50 MG tablet Take 50 mg by mouth 2 (two) times daily as needed. 12/14/23   [provider]  trimethoprim  (TRIMPEX ) 100 MG tablet Take 1 tablet (100 mg total) by mouth daily. 04/19/24    Stoioff, Glendia BROCKS, MD  vitamin B-12 (CYANOCOBALAMIN ) 250 MCG tablet Take 1 tablet (250 mcg total) by mouth daily. 07/02/21   Angiulli, Toribio PARAS, PA-C    Family History Family History  Problem Relation Age of Onset   Arthritis Mother        pt denies   Hypertension Mother        pt denies   Obesity Sister    Cancer Maternal Uncle        type unknown   Colon cancer Neg Hx    Stomach cancer Neg Hx    Esophageal cancer Neg Hx    Pancreatic cancer Neg Hx  Social History Social History   Tobacco Use   Smoking status: Never    Passive exposure: Never   Smokeless tobacco: Never  Vaping Use   Vaping status: Never Used  Substance Use Topics   Alcohol  use: No   Drug use: Not Currently     Allergies   Gluten meal, Wheat, Adhesive [tape], Codeine, Other, and Penicillins   Review of Systems Review of Systems  As per HPI  Physical Exam Triage Vital Signs ED Triage Vitals [07/31/24 1403]  Encounter Vitals Group     BP 121/65     Girls Systolic BP Percentile      Girls Diastolic BP Percentile      Boys Systolic BP Percentile      Boys Diastolic BP Percentile      Pulse Rate 81     Resp 18     Temp (!) 97.3 F (36.3 C)     Temp Source Oral     SpO2 97 %     Weight      Height      Head Circumference      Peak Flow      Pain Score      Pain Loc      Pain Education      Exclude from Growth Chart    No data found.  Updated Vital Signs BP 121/65 (BP Location: Left Arm)   Pulse 81   Temp (!) 97.3 F (36.3 C) (Oral)   Resp 18   SpO2 97%    Physical Exam Vitals and nursing note reviewed.  Constitutional:      General: She is not in acute distress. HENT:     Mouth/Throat:     Mouth: Mucous membranes are moist.     Pharynx: Oropharynx is clear.  Eyes:     Conjunctiva/sclera: Conjunctivae normal.     Pupils: Pupils are equal, round, and reactive to light.  Cardiovascular:     Rate and Rhythm: Normal rate and regular rhythm.     Heart sounds: Normal  heart sounds.  Pulmonary:     Effort: Pulmonary effort is normal.     Breath sounds: Normal breath sounds.  Abdominal:     Palpations: Abdomen is soft.     Tenderness: There is no abdominal tenderness. There is no right CVA tenderness, left CVA tenderness or guarding.  Neurological:     Mental Status: She is alert and oriented to person, place, and time.      UC Treatments / Results  Labs (all labs ordered are listed, but only abnormal results are displayed) Labs Reviewed  POCT URINE DIPSTICK - Abnormal; Notable for the following components:      Result Value   Clarity, UA cloudy (*)    Blood, UA large (*)    Nitrite, UA Positive (*)    Leukocytes, UA Moderate (2+) (*)    All other components within normal limits  URINE CULTURE    EKG  Radiology No results found.  Procedures Procedures   Medications Ordered in UC Medications - No data to display  Initial Impression / Assessment and Plan / UC Course  I have reviewed the triage vital signs and the nursing notes.  Pertinent labs & imaging results that were available during my care of the patient were reviewed by me and considered in my medical decision making (see chart for details).  UA with large RBC, moderate leuks, +nitrates Culture is pending Treat for acute cystitis.  Keflex  causes  diarrhea per spouse. She does not tolerate Augmentin. On chart review she had ciprofloxacin  in April. Discussed with spouse, this was well tolerated and effective. 250 mg BID x 7 days. They have a follow up appointment scheduled with urology for Monday 11/3 (In 6 days) ED precautions in the meantime   Final Clinical Impressions(s) / UC Diagnoses   Final diagnoses:  Acute cystitis with hematuria     Discharge Instructions      Please take Ciprofloxacin  antibiotic as prescribed. Take with food to avoid upset stomach. Finish the full course.    ED Prescriptions     Medication Sig Dispense Auth. Provider   ciprofloxacin   (CIPRO ) 250 MG tablet Take 1 tablet (250 mg total) by mouth 2 (two) times daily for 7 days. 14 tablet Javeah Loeza, Asberry, PA-C      PDMP not reviewed this encounter.   Jeryl Asberry, [place] 07/31/24 1551

## 2024-07-31 NOTE — Patient Instructions (Addendum)
 Your Plan:  Continue Namenda  and Aricept  at current dosages  Increase Seroquel  to 12.5mg  tablet mid afternoon and continue 25mg  nightly  Referral placed to psychiatry for further medications management       Follow up in 6-9 months with Dr. Rosemarie or call earlier if needed      Thank you for coming to see us  at [facility]. I hope we have been able to provide you high quality care today.  You may receive a patient satisfaction survey over the next few weeks. We would appreciate your feedback and comments so that we may continue to improve ourselves and the health of our patients.

## 2024-07-31 NOTE — Telephone Encounter (Signed)
 Referral for psychiatry fax to [facility]. Phone: [phone], Fax: [phone]

## 2024-07-31 NOTE — Discharge Instructions (Addendum)
 Please take Ciprofloxacin  antibiotic as prescribed. Take with food to avoid upset stomach. Finish the full course.

## 2024-07-31 NOTE — ED Triage Notes (Signed)
 Pt here for recurrent UTI per family; pt with hx of stroke and increased confusion in only sx; family sts did at home UTI test and came back positive

## 2024-08-02 ENCOUNTER — Ambulatory Visit (HOSPITAL_COMMUNITY): Payer: Self-pay

## 2024-08-02 LAB — URINE CULTURE: Culture: >=100000 — AB

## 2024-08-04 NOTE — Progress Notes (Unsigned)
 08/06/2024 9:18 PM   Caroline [name] 1936-01-08 [id number]  Referring provider: Gayle Saddie FALCON, PA-C [address] [place],  [place] [address]  Urological history: 1. rUTI's - July 31, 2024 - Klebsiella oxytoca - January 06, 2024 - Enterobacter cloacae  complex  - trimethoprim  100 mg daily  2. Urinary incontinence - failed oxybutynin, tolterodine, mirabegron  and PTNS  Chief Complaint  Patient presents with   Over Active Bladder   HPI: Caroline [name] is a 88 y.o.woman who presents today for positive at home urine test with her husband, Woodrow.    Previous records reviewed.  History is provided by the husband.    She was seen in the urgent care on 07/31/2024 for concerns for an UTI.  Her husband had checked her urine with an at home test he had found in a cupboard and it tested positive for infection.  At that time, she was having a stronger urine odor.  The urine dip was yellow cloudy, specific gravity 1.020, large heme, pH 5.5, nitrite positive and moderate leuks.  She was sent home on Cipro  250 mg BID x 7 days, which she is currently taking, and will finish today.  Urine culture grew out Klebsiella oxytoca and it was sensitive to the Cipro .    Mr. Fromme states her usual symptoms of an UTI are an increase in confusion.  There has not been any complaint of dysuria, pain or fevers with her UTI's.    Today, she feels well.  She is incontinence and wears depends daily.  She has been on trimethoprim  100 mg once daily and applying vaginal estrogen cream three nights weekly.  Patient denies any modifying or aggravating factors.  Patient denies any recent UTI's, gross hematuria, dysuria or suprapubic/flank pain.  Patient denies any fevers, chills, nausea or vomiting.    UA yellow clear, specific gravity 1.015, pH 5.0, 0-5 RBC's, 0-5 WBC's, 0-5 squames and mucus present.    PVR 14 mL   Serum creatinine (04/2024) 0.82, eGFR 70  Hemoglobin A1c (04/2024) 5.6  PMH: Past Medical  History:  Diagnosis Date   Acute ischemic stroke (HCC) 02/08/2018   Anxiety    Arthritis    Celiac sprue    CVA (cerebral vascular accident) (HCC) 02/2018   R thalamic CVA   GERD (gastroesophageal reflux disease)    Glaucoma    Hormone replacement therapy (HRT)    Hyperlipidemia    Hypertension    Hypoglycemia    Hypothyroidism    OAB (overactive bladder)    Osteoporosis    PONV (postoperative nausea and vomiting)    Raynaud's disease    Right thalamic stroke (HCC)    Scoliosis    Seasonal allergies    Senile purpura    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 Cheek   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   Bridge Left Nose, and Left Neck   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 Cheek Tx: curret x 3 and 5FU   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   Bridge of nose tx Cx3 and 5FU, Left neck Tx Cx3 and 5FU   Thrombophlebitis     Surgical History: Past Surgical History:  Procedure Laterality Date   ABDOMINAL HYSTERECTOMY  1979   CATARACT EXTRACTION, BILATERAL     knee surgery Left    VARICOSE VEIN SURGERY Left    XI ROBOTIC ASSISTED PARAESOPHAGEAL HERNIA REPAIR N/A 06/15/2021  Procedure: XI ROBOTIC ASSISTED PARAESOPHAGEAL HERNIA REPAIR, GASTROPEXY, UPPER ENDOSCOPY;  Surgeon: Signe Mitzie LABOR, MD;  Location: [facility];  Service: General;  Laterality: N/A;    Home Medications:  Allergies as of 08/06/2024       Reactions   Gluten Meal Nausea And Vomiting   Patient has CELIAC DISEASE   Wheat Nausea And Vomiting   Patient has CELIAC DISEASE   Adhesive [tape] Other (See Comments)   Patient's skin is VERY THIN and tears very easily; please use either paper tape or Coban wrap   Codeine Nausea And Vomiting, Other (See Comments)   other   Other Other (See Comments)   Patient has Hypoglycemia; her husband stated when her sugar crashes, it crashes hard.   Penicillins Other (See Comments)   Doesn't remember  reaction , but denies any SOB/Swelling States  it was a long time ago          Medication List        Accurate as of August 06, 2024 11:59 PM. If you have any questions, ask your nurse or doctor.          acetaminophen  325 MG tablet Commonly known as: TYLENOL  Take 2 tablets (650 mg total) by mouth every 6 (six) hours as needed for mild pain, moderate pain, fever or headache.   atorvastatin  80 MG tablet Commonly known as: LIPITOR  TAKE 1 TABLET (80 MG TOTAL) BY MOUTH AT BEDTIME.   calcium  carbonate 1250 (500 Ca) MG tablet Commonly known as: CVS Calcium  Carbonate Take 2 tablets (2,500 mg total) by mouth daily.   cholecalciferol  25 MCG (1000 UNIT) tablet Commonly known as: VITAMIN D3 Take 0.5 tablets (500 Units total) by mouth daily.   ciprofloxacin  250 MG tablet Commonly known as: Cipro  Take 1 tablet (250 mg total) by mouth 2 (two) times daily for 7 days.   cyanocobalamin  250 MCG tablet Commonly known as: VITAMIN B12 Take 1 tablet (250 mcg total) by mouth daily.   donepezil  10 MG tablet Commonly known as: Aricept  Take 1 tablet (10 mg total) by mouth daily.   econazole nitrate  1 % cream Apply topically daily. Use on affected areas under breast twice a day until clear when flared.   Eliquis  5 MG Tabs tablet Generic drug: apixaban  TAKE 1 TABLET BY MOUTH 2 TIMES DAILY.   estradiol  0.1 MG/GM vaginal cream Commonly known as: ESTRACE  Apply a pea-sized amount to fingertip or Q-tip and wipe in vaginal roof twice weekly   furosemide  20 MG tablet Commonly known as: LASIX  Take 1 tablet (20 mg total) by mouth daily as needed for edema.   irbesartan  75 MG tablet Commonly known as: AVAPRO  TAKE 1 TABLET (75 MG TOTAL) BY MOUTH DAILY.   levothyroxine  125 MCG tablet Commonly known as: SYNTHROID  TAKE 1 TABLET (125 MCG TOTAL) BY MOUTH DAILY AT 6 AM.   meclizine  25 MG tablet Commonly known as: ANTIVERT  Take 0.5 tablets (12.5 mg total) by mouth 3 (three) times daily as  needed for dizziness.   memantine  5 MG tablet Commonly known as: NAMENDA  Take 1 tablet (5 mg total) by mouth 2 (two) times daily.   methenamine 1 g tablet Commonly known as: Hiprex Take 1 tablet (1 g total) by mouth 2 (two) times daily with a meal. Started by: CLOTILDA CORNWALL   ondansetron  4 MG tablet Commonly known as: ZOFRAN  Take 1 tablet (4 mg total) by mouth every 6 (six) hours.   pantoprazole  40 MG tablet Commonly known as: PROTONIX  TAKE 1  TABLET BY MOUTH DAILY   QUEtiapine  25 MG tablet Commonly known as: SEROquel  Take 0.5 tablets (12.5 mg total) by mouth daily in the afternoon AND 1 tablet (25 mg total) at bedtime.   traMADol  50 MG tablet Commonly known as: ULTRAM  Take 50 mg by mouth 2 (two) times daily as needed.   trimethoprim  100 MG tablet Commonly known as: TRIMPEX  Take 1 tablet (100 mg total) by mouth daily.   vitamin C  1000 MG tablet Take 1,000 mg by mouth daily.        Allergies:  Allergies  Allergen Reactions   Gluten Meal Nausea And Vomiting    Patient has CELIAC DISEASE   Wheat Nausea And Vomiting    Patient has CELIAC DISEASE   Adhesive [Tape] Other (See Comments)    Patient's skin is VERY THIN and tears very easily; please use either paper tape or Coban wrap   Codeine Nausea And Vomiting and Other (See Comments)    other   Other Other (See Comments)    Patient has Hypoglycemia; her husband stated when her sugar crashes, it crashes hard.   Penicillins Other (See Comments)    Doesn't remember reaction , but denies any SOB/Swelling States  it was a long time ago      Family History: Family History  Problem Relation Age of Onset   Arthritis Mother        pt denies   Hypertension Mother        pt denies   Obesity Sister    Cancer Maternal Uncle        type unknown   Colon cancer Neg Hx    Stomach cancer Neg Hx    Esophageal cancer Neg Hx    Pancreatic cancer Neg Hx     Social History:  reports that she has never smoked. She has  never been exposed to tobacco smoke. She has never used smokeless tobacco. She reports that she does not currently use drugs. She reports that she does not drink alcohol .  ROS: Pertinent ROS in HPI  Physical Exam: BP (!) 150/75 (BP Location: Left Arm, Patient Position: Sitting, Cuff Size: Normal)   Pulse 94   Wt 142 lb (64.4 kg)   SpO2 94%   BMI 25.15 kg/m   Constitutional:  Well nourished. Alert and oriented, No acute distress. HEENT: [place] AT, moist mucus membranes.  Trachea midline Cardiovascular: No clubbing, cyanosis, or edema. Respiratory: Normal respiratory effort, no increased work of breathing. Neurologic: Grossly intact, no focal deficits, moving all 4 extremities. Psychiatric: Normal mood and affect.    Laboratory Data: See Epic and HPI   I have reviewed the labs.   Pertinent Imaging:  08/06/24 15:25  Scan Result 14mL    Assessment & Plan:    1. rUTI's - criteria for recurrent UTI had been met previously with 2 or more infections in 6 months or 3 or greater infections in one year  - continue vaginal estrogen cream  - patient is instructed to increase their water intake until the urine is pale yellow or clear (10 to 12 cups daily)  - start Hiprex 1 gram twice daily if creatinine clearance is > 30   2. Genitourinary Syndrome of Menopause (GSM)  - Explained that GSM is a common condition that affects women during and after menopause. It is characterized by a cluster of symptoms related to the genital and urinary tracts, including: vaginal dryness, pain or discomfort during intercourse (dyspareunia), burning or irritation in the vulva  or vagina, thinning or loss of vaginal tissue, frequent urination, urgency (feeling the need to urinate immediately), incontinence (loss of bladder control), and pain or burning during urination - explained that GSM is primarily caused by the decline in estrogen levels during menopause. This leads to changes in the vaginal tissue, making it  thinner, drier, and more susceptible to irritation. Other factors that may contribute to GSM include: Smoking, certain medications (e.g., chemotherapy drugs, and pelvic organ prolapse - advised that First-line treatment options are: Local low-dose vaginal estrogen (cream, ring, tablet), which improves dryness, irritation, dyspareunia and it is safe for most patients, including those with history of breast cancer (with multidisciplinary input). - advised they can also use vaginal moisturizers and lubricants (alone or combined) and avoid irritants and harsh cleansers.  Pelvic floor physical therapy for dysfunction is also helpful.  - Reassured that there is no evidence linking local estrogen to breast or endometrial cancer - Explained that it may take up to 8 months of consistent application of the vaginal estrogen cream to cause unnecessary changes needed to address GSM and that this will be a lifelong medication - continue with vaginal estrogen cream, applying a pea-sized amount with a fingertip just inside the vaginal introitus every night for 30 days and then continuing on to Monday, Wednesday and Friday nights  3. Chronic colonization - reinforced education on the difference between colonization and infection- presence of bacteria alone does not always require treatment - continue preventive strategies for rUTI's - obtain urine testing only if new or worsening urinary symptoms develop - follow up as needed or if new symptoms occur                                   Return in about 1 month (around 09/05/2024) for UA, OAB questionnaire and exam .  These notes generated with voice recognition software. I apologize for typographical errors.  CLOTILDA HELON RIGGERS  [facility] [address]  [address] [place], [place] [address] [phone]

## 2024-08-06 ENCOUNTER — Other Ambulatory Visit
Admission: RE | Admit: 2024-08-06 | Discharge: 2024-08-06 | Disposition: A | Discharge status: Home / Self Care | Payer: Medicare (Managed Care) | Attending: Urology | Admitting: Urology

## 2024-08-06 ENCOUNTER — Ambulatory Visit: Payer: Medicare (Managed Care) | Admitting: Urology

## 2024-08-06 ENCOUNTER — Other Ambulatory Visit: Payer: Self-pay

## 2024-08-06 ENCOUNTER — Ambulatory Visit: Payer: Self-pay | Admitting: Urology

## 2024-08-06 VITALS — BP 150/75 | HR 94 | Wt 142.0 lb

## 2024-08-06 DIAGNOSIS — N390 Urinary tract infection, site not specified: Secondary | ICD-10-CM

## 2024-08-06 DIAGNOSIS — N958 Other specified menopausal and perimenopausal disorders: Secondary | ICD-10-CM | POA: Diagnosis not present

## 2024-08-06 DIAGNOSIS — R8271 Bacteriuria: Secondary | ICD-10-CM

## 2024-08-06 LAB — URINALYSIS, COMPLETE (UACMP) WITH MICROSCOPIC
Bacteria, UA: NONE SEEN
Bilirubin Urine: NEGATIVE
Glucose, UA: NEGATIVE mg/dL
Hgb urine dipstick: NEGATIVE
Ketones, ur: NEGATIVE mg/dL
Leukocytes,Ua: NEGATIVE
Nitrite: NEGATIVE
Protein, ur: NEGATIVE mg/dL
Specific Gravity, Urine: 1.015 (ref 1.005–1.030)
pH: 5 (ref 5.0–8.0)

## 2024-08-06 LAB — BLADDER SCAN AMB NON-IMAGING

## 2024-08-06 MED ORDER — METHENAMINE HIPPURATE 1 G PO TABS: 1.0000 g | ORAL_TABLET | Freq: Two times a day (BID) | ORAL | 3 refills | Status: AC

## 2024-08-08 ENCOUNTER — Telehealth: Payer: Self-pay | Admitting: Urology

## 2024-08-08 ENCOUNTER — Encounter: Payer: Self-pay | Admitting: Adult Health

## 2024-08-08 NOTE — Telephone Encounter (Signed)
 Patient's husband Caroline [name]-on DPR) called in regarding results. Caroline [name] had sent a fpl group, but they have not been able to access mychart, so they didn't get message. I read Shannon's message to patient and he verbalized understanding.

## 2024-08-09 ENCOUNTER — Other Ambulatory Visit: Payer: Self-pay | Admitting: Urology

## 2024-08-09 ENCOUNTER — Encounter: Payer: Self-pay | Admitting: Urology

## 2024-08-15 ENCOUNTER — Other Ambulatory Visit: Payer: Self-pay

## 2024-08-15 ENCOUNTER — Other Ambulatory Visit: Payer: Self-pay | Admitting: Adult Health

## 2024-08-21 NOTE — Telephone Encounter (Signed)
 We received a referral that she is scheduled with Dr. Tasia on 10/02/24

## 2024-09-03 NOTE — Progress Notes (Unsigned)
 09/05/2024 3:37 PM   Caroline [name] [birth year] [id number]  Referring provider: Gayle Saddie FALCON, PA-C [address] [place],  [place] [address]  Urological history: 1. rUTI's - July 31, 2024 - Klebsiella oxytoca - January 06, 2024 - Enterobacter cloacae  complex  - trimethoprim  100 mg daily  2. Urinary incontinence - failed oxybutynin, tolterodine, mirabegron  and PTNS  Chief Complaint  Patient presents with   Recurrent UTI   HPI: Caroline [name] is a 88 y.o.woman who presents today for one month follow up with her husband, Caroline [name].    Previous records reviewed.  History is provided by the husband.    Serum creatinine (04/2024) 0.82, eGFR 70  Hemoglobin A1c (04/2024) 5.6  PMH: 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 Cheek   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   Bridge Left Nose, and Left Neck   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 Cheek Tx: curret x 3 and 5FU   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   Bridge of nose tx Cx3 and 5FU, Left neck Tx Cx3 and 5FU   Thrombophlebitis     Surgical History: Past Surgical History:  Procedure Laterality Date   ABDOMINAL HYSTERECTOMY  1979   CATARACT EXTRACTION, BILATERAL     knee surgery Left    VARICOSE VEIN SURGERY Left    XI ROBOTIC ASSISTED PARAESOPHAGEAL HERNIA REPAIR N/A 06/15/2021   Procedure: XI  ROBOTIC ASSISTED PARAESOPHAGEAL HERNIA REPAIR, GASTROPEXY, UPPER ENDOSCOPY;  Surgeon: Signe Mitzie LABOR, MD;  Location: [facility];  Service: General;  Laterality: N/A;    Home Medications:  Allergies as of 09/05/2024       Reactions   Gluten Meal Nausea And Vomiting   Patient has CELIAC DISEASE   Wheat Nausea And Vomiting   Patient has CELIAC DISEASE   Adhesive [tape] Other (See Comments)   Patient's skin is VERY THIN and tears very easily; please use either paper tape or Coban wrap   Codeine Nausea And Vomiting, Other (See Comments)   other   Other Other (See Comments)   Patient has Hypoglycemia; her husband stated when her sugar crashes, it crashes hard.   Penicillins Other (See Comments)   Doesn't remember reaction , but denies any SOB/Swelling States  it was a long time ago          Medication List        Accurate as of September 05, 2024  3:37 PM. If you have any questions, ask your nurse or doctor.          acetaminophen  325 MG tablet Commonly known as: TYLENOL  Take 2 tablets (650 mg total) by mouth every 6 (six) hours as needed for mild pain, moderate pain, fever or headache.  atorvastatin  80 MG tablet Commonly known as: LIPITOR  TAKE 1 TABLET (80 MG TOTAL) BY MOUTH AT BEDTIME.   calcium  carbonate 1250 (500 Ca) MG tablet Commonly known as: [facility] Calcium  Carbonate Take 2 tablets (2,500 mg total) by mouth daily.   cholecalciferol  25 MCG (1000 UNIT) tablet Commonly known as: VITAMIN D3 Take 0.5 tablets (500 Units total) by mouth daily.   cyanocobalamin  250 MCG tablet Commonly known as: VITAMIN B12 Take 1 tablet (250 mcg total) by mouth daily.   donepezil  10 MG tablet Commonly known as: ARICEPT  TAKE 1 TABLET (10 MG TOTAL) BY MOUTH DAILY.   econazole nitrate  1 % cream Apply topically daily. Use on affected areas under breast twice a day until clear when flared.   Eliquis  5 MG Tabs tablet Generic drug: apixaban  TAKE 1 TABLET BY MOUTH 2 TIMES DAILY.    estradiol  0.1 MG/GM vaginal cream Commonly known as: ESTRACE  Apply a pea-sized amount to fingertip or Q-tip and wipe in vaginal roof twice weekly   furosemide  20 MG tablet Commonly known as: LASIX  Take 1 tablet (20 mg total) by mouth daily as needed for edema.   irbesartan  75 MG tablet Commonly known as: AVAPRO  TAKE 1 TABLET (75 MG TOTAL) BY MOUTH DAILY.   levothyroxine  125 MCG tablet Commonly known as: SYNTHROID  TAKE 1 TABLET (125 MCG TOTAL) BY MOUTH DAILY AT 6 AM.   meclizine  25 MG tablet Commonly known as: ANTIVERT  Take 0.5 tablets (12.5 mg total) by mouth 3 (three) times daily as needed for dizziness.   memantine  5 MG tablet Commonly known as: NAMENDA  Take 1 tablet (5 mg total) by mouth 2 (two) times daily.   methenamine  1 g tablet Commonly known as: Hiprex  Take 1 tablet (1 g total) by mouth 2 (two) times daily with a meal.   ondansetron  4 MG tablet Commonly known as: ZOFRAN  Take 1 tablet (4 mg total) by mouth every 6 (six) hours.   pantoprazole  40 MG tablet Commonly known as: PROTONIX  TAKE 1 TABLET BY MOUTH DAILY   QUEtiapine  25 MG tablet Commonly known as: SEROQUEL  TAKE 1 TABLET (25 MG TOTAL) BY MOUTH AT BEDTIME.   traMADol  50 MG tablet Commonly known as: ULTRAM  Take 50 mg by mouth 2 (two) times daily as needed.   trimethoprim  100 MG tablet Commonly known as: TRIMPEX  TAKE 1 TABLET BY MOUTH DAILY.   vitamin C  1000 MG tablet Take 1,000 mg by mouth daily.        Allergies:  Allergies  Allergen Reactions   Gluten Meal Nausea And Vomiting    Patient has CELIAC DISEASE   Wheat Nausea And Vomiting    Patient has CELIAC DISEASE   Adhesive [Tape] Other (See Comments)    Patient's skin is VERY THIN and tears very easily; please use either paper tape or Coban wrap   Codeine Nausea And Vomiting and Other (See Comments)    other   Other Other (See Comments)    Patient has Hypoglycemia; her husband stated when her sugar crashes, it crashes hard.    Penicillins Other (See Comments)    Doesn't remember reaction , but denies any SOB/Swelling States  it was a long time ago      Family History: Family History  Problem Relation Age of Onset   Arthritis Mother        pt denies   Hypertension Mother        pt denies   Obesity Sister    Cancer Maternal Uncle  type unknown   Colon cancer Neg Hx    Stomach cancer Neg Hx    Esophageal cancer Neg Hx    Pancreatic cancer Neg Hx     Social History:  reports that she has never smoked. She has never been exposed to tobacco smoke. She has never used smokeless tobacco. She reports that she does not currently use drugs. She reports that she does not drink alcohol .  ROS: Pertinent ROS in HPI  Physical Exam: Ht 5' 3 (1.6 m)   Wt 142 lb (64.4 kg)   BMI 25.15 kg/m   Constitutional:  Well nourished. Alert and oriented, No acute distress. HEENT: [place] AT, moist mucus membranes.  Trachea midline, no masses. Cardiovascular: No clubbing, cyanosis, or edema. Respiratory: Normal respiratory effort, no increased work of breathing. GU: No CVA tenderness.  No bladder fullness or masses.  Recession of labia minora, dry, pale vulvar vaginal mucosa and loss of mucosal ridges and folds.  Normal urethral meatus, no lesions, no prolapse, no discharge.   No urethral masses, tenderness and/or tenderness. No bladder fullness, tenderness or masses. *** vagina mucosa, *** estrogen effect, no discharge, no lesions, *** pelvic support, *** cystocele and *** rectocele noted.  No cervical motion tenderness.  Uterus is freely mobile and non-fixed.  No adnexal/parametria masses or tenderness noted.  Anus and perineum are without rashes or lesions.   ***  Neurologic: Grossly intact, no focal deficits, moving all 4 extremities. Psychiatric: Normal mood and affect.     Laboratory Data: See Epic and HPI   I have reviewed the labs.   Pertinent Imaging: N/A  Assessment & Plan:    1. rUTI's - ***  2.  Genitourinary Syndrome of Menopause (GSM)  - ***  3. Chronic colonization - ***                                  No follow-ups on file.  These notes generated with voice recognition software. I apologize for typographical errors.  Caroline [name]  [facility] [address]  [address] [place], [place] [address] [phone]

## 2024-09-05 ENCOUNTER — Ambulatory Visit: Payer: Medicare (Managed Care) | Admitting: Urology

## 2024-09-05 VITALS — BP 169/72 | HR 97 | Ht 63.0 in | Wt 142.0 lb

## 2024-09-05 DIAGNOSIS — R351 Nocturia: Secondary | ICD-10-CM

## 2024-09-05 DIAGNOSIS — N958 Other specified menopausal and perimenopausal disorders: Secondary | ICD-10-CM | POA: Diagnosis not present

## 2024-09-05 DIAGNOSIS — R8271 Bacteriuria: Secondary | ICD-10-CM | POA: Diagnosis not present

## 2024-09-05 DIAGNOSIS — N390 Urinary tract infection, site not specified: Secondary | ICD-10-CM | POA: Diagnosis not present

## 2024-09-07 ENCOUNTER — Other Ambulatory Visit: Payer: Self-pay

## 2024-09-07 DIAGNOSIS — N390 Urinary tract infection, site not specified: Secondary | ICD-10-CM | POA: Diagnosis not present

## 2024-09-08 ENCOUNTER — Encounter: Payer: Self-pay | Admitting: Urology

## 2024-09-10 ENCOUNTER — Telehealth: Payer: Self-pay

## 2024-09-10 LAB — MICROSCOPIC EXAMINATION

## 2024-09-10 LAB — URINALYSIS, COMPLETE
Bilirubin, UA: NEGATIVE
Glucose, UA: NEGATIVE
Ketones, UA: NEGATIVE
Leukocytes,UA: NEGATIVE
Nitrite, UA: NEGATIVE
Protein,UA: NEGATIVE
RBC, UA: NEGATIVE
Specific Gravity, UA: 1.01 (ref 1.005–1.030)
Urobilinogen, Ur: 0.2 mg/dL (ref 0.2–1.0)
pH, UA: 6 (ref 5.0–7.5)

## 2024-09-10 NOTE — Telephone Encounter (Signed)
 The patient's husband (listed on DPR) called requesting an update on the patient's urine sample results. He was informed that the urine culture results are not available yet. He was advised that if the culture returns positive for infection, the provider will contact the patient either through MyChart or by phone. pt's husband voiced understanding.

## 2024-09-11 LAB — CULTURE, URINE COMPREHENSIVE

## 2024-09-12 ENCOUNTER — Ambulatory Visit: Payer: Self-pay | Admitting: Urology

## 2024-09-12 DIAGNOSIS — N390 Urinary tract infection, site not specified: Secondary | ICD-10-CM

## 2024-09-12 MED ORDER — CIPROFLOXACIN HCL 250 MG PO TABS: 250.0000 mg | ORAL_TABLET | Freq: Two times a day (BID) | ORAL | 0 refills | Status: AC

## 2024-09-12 NOTE — Telephone Encounter (Signed)
-----   Message from [facility] sent at 09/12/2024  9:57 AM EST ----- Would you let the [name] know that Caroline [name] urine culture is positive for infection and that we need to start an antibiotic?  She needs to start cipro  250 mg twice daily for seven days.  We will  then need to get her back into the office in 2 months for a CATH UA.   ----- Message ----- From: Interface, [facility] Lab Results In Sent: 09/09/2024   9:35 AM EST To: Clotilda DELENA Cornwall, PA-C

## 2024-09-12 NOTE — Telephone Encounter (Signed)
 Spoke patient husband Caroline [name] indicated on [place], advised that culture was positive for infection and Cipro  250mg  will be sent to the pharmacy.  2 month f/u appointment was also scheduled. Patient husband verbalized understanding of the information given.   Andrea Kirks LPN

## 2024-09-13 ENCOUNTER — Other Ambulatory Visit: Payer: Self-pay | Admitting: Dermatology

## 2024-09-20 ENCOUNTER — Ambulatory Visit: Payer: Medicare (Managed Care) | Admitting: Podiatry

## 2024-09-20 ENCOUNTER — Encounter: Payer: Self-pay | Admitting: Podiatry

## 2024-09-20 VITALS — Ht 63.0 in | Wt 142.0 lb

## 2024-09-20 DIAGNOSIS — M79674 Pain in right toe(s): Secondary | ICD-10-CM | POA: Diagnosis not present

## 2024-09-20 DIAGNOSIS — D689 Coagulation defect, unspecified: Secondary | ICD-10-CM

## 2024-09-20 DIAGNOSIS — M79675 Pain in left toe(s): Secondary | ICD-10-CM | POA: Diagnosis not present

## 2024-09-20 DIAGNOSIS — B351 Tinea unguium: Secondary | ICD-10-CM | POA: Diagnosis not present

## 2024-09-20 NOTE — Progress Notes (Signed)
 This patient returns to my office for at risk foot care.  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  This patient states she is having pain in the outside border right big toe.  The toe is painful walking and wearing her shoes.  She presents to the office with her husband.  This patient presents for at risk foot care today.  General Appearance  Alert, conversant and in no acute stress.  Vascular  Dorsalis pedis and posterior tibial  pulses are  weakly palpable  bilaterally.  Capillary return is within normal limits  bilaterally. Temperature is within normal limits  bilaterally.  Neurologic  Senn-Weinstein monofilament wire test within normal limits  bilaterally. Muscle power within normal limits bilaterally.  Nails Thick disfigured discolored nails with subungual debris  hallux  nails bilaterally. No evidence of bacterial infection or drainage bilaterally.  Orthopedic  No limitations of motion  feet .  No crepitus or effusions noted.  No bony pathology or digital deformities noted.  Skin  normotropic skin with no porokeratosis noted bilaterally.  No signs of infections or ulcers noted.     Paronychia right great toenail.  Pain in right toes  Pain in left toes  Consent was obtained for treatment procedures.   Mechanical debridement of nails 1-5  bilaterally performed with a nail nipper.  Filed with dremel without incident.    Return office visit    10 weeks.                 Told patient to return for periodic foot care and evaluation due to potential at risk complications.   Cordella Bold DPM

## 2024-09-29 ENCOUNTER — Other Ambulatory Visit: Payer: Self-pay | Admitting: Adult Health

## 2024-10-03 ENCOUNTER — Other Ambulatory Visit: Payer: Self-pay

## 2024-10-03 DIAGNOSIS — L304 Erythema intertrigo: Secondary | ICD-10-CM

## 2024-10-03 MED ORDER — ECONAZOLE NITRATE 1 % EX CREA: TOPICAL_CREAM | Freq: Every day | CUTANEOUS | 2 refills | Status: DC

## 2024-10-03 NOTE — Progress Notes (Signed)
 Pt's daughter Burnard, called requesting refill for mother as she is having an intertrigo flare. She has an OV scheduled for end on Jan in regard but was out of her topical.

## 2024-10-24 ENCOUNTER — Ambulatory Visit: Payer: Medicare (Managed Care) | Admitting: Urology

## 2024-10-24 ENCOUNTER — Encounter: Payer: Self-pay | Admitting: Urology

## 2024-10-24 VITALS — BP 164/70 | HR 66 | Ht 63.0 in | Wt 145.0 lb

## 2024-10-24 DIAGNOSIS — N390 Urinary tract infection, site not specified: Secondary | ICD-10-CM

## 2024-10-24 LAB — MICROSCOPIC EXAMINATION: Bacteria, UA: NONE SEEN

## 2024-10-24 LAB — URINALYSIS, COMPLETE
Bilirubin, UA: NEGATIVE
Glucose, UA: NEGATIVE
Ketones, UA: NEGATIVE
Leukocytes,UA: NEGATIVE
Nitrite, UA: NEGATIVE
Protein,UA: NEGATIVE
RBC, UA: NEGATIVE
Specific Gravity, UA: 1.03 (ref 1.005–1.030)
Urobilinogen, Ur: 0.2 mg/dL (ref 0.2–1.0)
pH, UA: 5.5 (ref 5.0–7.5)

## 2024-10-24 NOTE — Progress Notes (Signed)
 "  10/24/2024 2:28 PM   Caroline [name] 01-20-1936 [id number]  Referring provider: Gayle Saddie FALCON, PA-C [address] [place],  [place] [address]  Chief Complaint  Patient presents with   Recurrent UTI   Urological history: 1. rUTI's - July 31, 2024 - Klebsiella oxytoca - January 06, 2024 - Enterobacter cloacae  complex  - trimethoprim  100 mg daily   2. Urinary incontinence - failed oxybutynin, tolterodine, mirabegron  and PTNS  HPI: Caroline [name] is a 89 y.o. female who presents for 86-month follow-up.  She saw Clotilda Cornwall 08/06/2024 for UTI.  She was started on Hiprex  at that visit.  Seen in follow-up 09/05/2024 and was doing well She has no complaints today Her husband is inquiring if she needs to continue the trimethoprim    PMH: 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 Cheek   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   Bridge Left Nose, and Left Neck   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 Cheek Tx: curret x 3 and 5FU   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   Bridge of nose tx Cx3 and 5FU, Left neck Tx Cx3 and 5FU   Thrombophlebitis     Surgical History: Past Surgical History:  Procedure Laterality Date   ABDOMINAL HYSTERECTOMY  1979   CATARACT EXTRACTION, BILATERAL     knee surgery Left    VARICOSE VEIN SURGERY Left    XI ROBOTIC ASSISTED PARAESOPHAGEAL  HERNIA REPAIR N/A 06/15/2021   Procedure: XI ROBOTIC ASSISTED PARAESOPHAGEAL HERNIA REPAIR, GASTROPEXY, UPPER ENDOSCOPY;  Surgeon: Signe Mitzie LABOR, MD;  Location: [facility];  Service: General;  Laterality: N/A;    Home Medications:  Allergies as of 10/24/2024       Reactions   Gluten Meal Nausea And Vomiting   Patient has CELIAC DISEASE   Wheat Nausea And Vomiting   Patient has CELIAC DISEASE   Adhesive [tape] Other (See Comments)   Patient's skin is VERY THIN and tears very easily; please use either paper tape or Coban wrap   Codeine Nausea And Vomiting, Other (See Comments)   other   Other Other (See Comments)   Patient has Hypoglycemia; her husband stated when her sugar crashes, it crashes hard.   Penicillins Other (See Comments)   Doesn't remember reaction , but denies any SOB/Swelling States  it was a long time ago          Medication List        Accurate as of October 24, 2024  2:28 PM. If you have any questions, ask your nurse or doctor.          acetaminophen  325 MG tablet Commonly known as: TYLENOL  Take 2 tablets (650 mg total) by mouth every 6 (six) hours  as needed for mild pain, moderate pain, fever or headache.   atorvastatin  80 MG tablet Commonly known as: LIPITOR  TAKE 1 TABLET (80 MG TOTAL) BY MOUTH AT BEDTIME.   calcium  carbonate 1250 (500 Ca) MG tablet Commonly known as: [facility]S Calcium  Carbonate Take 2 tablets (2,500 mg total) by mouth daily.   cholecalciferol  25 MCG (1000 UNIT) tablet Commonly known as: VITAMIN D3 Take 0.5 tablets (500 Units total) by mouth daily.   cyanocobalamin  250 MCG tablet Commonly known as: VITAMIN B12 Take 1 tablet (250 mcg total) by mouth daily.   donepezil  10 MG tablet Commonly known as: ARICEPT  TAKE 1 TABLET (10 MG TOTAL) BY MOUTH DAILY.   econazole nitrate  1 % cream Apply topically daily. Use on affected areas under breast twice a day until clear when flared.   Eliquis  5 MG Tabs tablet Generic drug:  apixaban  TAKE 1 TABLET BY MOUTH 2 TIMES DAILY.   estradiol  0.1 MG/GM vaginal cream Commonly known as: ESTRACE  Apply a pea-sized amount to fingertip or Q-tip and wipe in vaginal roof twice weekly   furosemide  20 MG tablet Commonly known as: LASIX  Take 1 tablet (20 mg total) by mouth daily as needed for edema.   irbesartan  75 MG tablet Commonly known as: AVAPRO  TAKE 1 TABLET (75 MG TOTAL) BY MOUTH DAILY.   levothyroxine  125 MCG tablet Commonly known as: SYNTHROID  TAKE 1 TABLET (125 MCG TOTAL) BY MOUTH DAILY AT 6 AM.   meclizine  25 MG tablet Commonly known as: ANTIVERT  Take 0.5 tablets (12.5 mg total) by mouth 3 (three) times daily as needed for dizziness.   memantine  5 MG tablet Commonly known as: NAMENDA  TAKE 1 TABLET BY MOUTH 2 TIMES DAILY.   methenamine  1 g tablet Commonly known as: Hiprex  Take 1 tablet (1 g total) by mouth 2 (two) times daily with a meal.   ondansetron  4 MG tablet Commonly known as: ZOFRAN  Take 1 tablet (4 mg total) by mouth every 6 (six) hours.   pantoprazole  40 MG tablet Commonly known as: PROTONIX  TAKE 1 TABLET BY MOUTH DAILY   QUEtiapine  25 MG tablet Commonly known as: SEROQUEL  TAKE 1 TABLET (25 MG TOTAL) BY MOUTH AT BEDTIME.   traMADol  50 MG tablet Commonly known as: ULTRAM  Take 50 mg by mouth 2 (two) times daily as needed.   trimethoprim  100 MG tablet Commonly known as: TRIMPEX  TAKE 1 TABLET BY MOUTH DAILY.   vitamin C  1000 MG tablet Take 1,000 mg by mouth daily.        Allergies: Allergies[1]  Family History: Family History  Problem Relation Age of Onset   Arthritis Mother        pt denies   Hypertension Mother        pt denies   Obesity Sister    Cancer Maternal Uncle        type unknown   Colon cancer Neg Hx    Stomach cancer Neg Hx    Esophageal cancer Neg Hx    Pancreatic cancer Neg Hx     Social History:  reports that she has never smoked. She has never been exposed to tobacco smoke. She has never used  smokeless tobacco. She reports that she does not currently use drugs. She reports that she does not drink alcohol .   Physical Exam: BP (!) 164/70   Pulse 66   Ht 5' 3 (1.6 m)   Wt 145 lb (65.8 kg)   BMI 25.69 kg/m   Constitutional:  Alert, No acute distress. HEENT:  [place] AT Respiratory: Normal respiratory effort, no increased work of breathing. Psychiatric: Normal mood and affect.  Laboratory Data:  Urinalysis Dipstick negative Microscopy 6-10 WBC    Assessment & Plan:    1.  Recurrent UTI Asymptomatic and UA today unremarkable Will have her discontinue the trimethoprim  and continue the Hiprex  She is scheduled for a follow-up appointment with Clotilda next month.  May need to restart the trimethoprim  if she has symptomatic recurrent infection   Caroline JAYSON Barba, MD  [facility] [address] [place], [place] [address] [phone]     [1]  Allergies Allergen Reactions   Gluten Meal Nausea And Vomiting    Patient has CELIAC DISEASE   Wheat Nausea And Vomiting    Patient has CELIAC DISEASE   Adhesive [Tape] Other (See Comments)    Patient's skin is VERY THIN and tears very easily; please use either paper tape or Coban wrap   Codeine Nausea And Vomiting and Other (See Comments)    other   Other Other (See Comments)    Patient has Hypoglycemia; her husband stated when her sugar crashes, it crashes hard.   Penicillins Other (See Comments)    Doesn't remember reaction , but denies any SOB/Swelling States  it was a long time ago     "

## 2024-10-24 NOTE — Progress Notes (Signed)
 Patient presents for an office visit. BP today is High. Greater than 140/90. Provider  notified and recheck Blood Pressure .  Pt advised to talk with PCP.  Pt voiced understanding.

## 2024-10-30 ENCOUNTER — Other Ambulatory Visit: Payer: Self-pay

## 2024-10-30 ENCOUNTER — Other Ambulatory Visit: Payer: Medicare (Managed Care)

## 2024-11-01 ENCOUNTER — Ambulatory Visit: Payer: Medicare (Managed Care) | Admitting: Dermatology

## 2024-11-01 ENCOUNTER — Encounter: Payer: Self-pay | Admitting: Dermatology

## 2024-11-01 VITALS — BP 101/64

## 2024-11-01 DIAGNOSIS — L304 Erythema intertrigo: Secondary | ICD-10-CM | POA: Diagnosis not present

## 2024-11-01 NOTE — Progress Notes (Signed)
" ° °  Follow-Up Visit   Subjective  Caroline [name] is a 89 y.o. female who presents for the following: Last OV - 03/08/2024 - Intertrigo follow up - She has not cleared up. She has econazole nitrate  cream and nystatin -triamcinolone  cream. Her husband states that he put one or the other on her but does not use either cream twice daily. It was recommended that she use Zeasorb powder but they do not remember the recommendation so they did not get it. Her left side seems to be worst side.  Accompanied by husband today.   The following portions of the chart were reviewed this encounter and updated as appropriate: medications, allergies, medical history  Review of Systems:  No other skin or systemic complaints except as noted in HPI or Assessment and Plan.  Objective  Well appearing patient in no apparent distress; mood and affect are within normal limits.   A focused examination was performed of the following areas:   Relevant exam findings are noted in the Assessment and Plan.        Assessment & Plan   INTERTRIGO Exam: Erythematous macerated patches of left inframammary area   Chronic intertrigo with cutaneous candidiasis Persistent irritation on the left side for over a year. Previous treatment with nystatin  and triamcinolone  cream was not fully effective due to inconsistent application and lack of training among caregivers. The condition is exacerbated by sweating, leading to recurrence. The current plan aims to clear the rash and prevent recurrence with appropriate antifungal and anti-inflammatory treatment.  - Apply nystatin  triamcinolone  cream twice daily for two weeks. - If the rash is clear after two weeks, apply Zeasorb antifungal powder to prevent recurrence. - If pinkness persists after two weeks, switch to econazole cream daily until clear. - Provided printed instructions and ensured access to instructions via MyChart. - Scheduled annual follow-up unless symptoms worsen or  treatment becomes ineffective.    No follow-ups on file.  I, Roseline Hutchinson, CMA, am acting as scribe for [employer], DO .    Documentation: I have reviewed the above documentation for accuracy and completeness, and I agree with the above.  Delon Lenis, DO    "

## 2024-11-01 NOTE — Patient Instructions (Addendum)
 " VISIT SUMMARY:  During your visit, we discussed the persistent rash on the left side of your breast that has been ongoing for over a year. We reviewed your previous treatments and the confusion regarding their application.  YOUR PLAN:  -CHRONIC INTERTRIGO WITH CUTANEOUS CANDIDIASIS:  This condition involves a persistent rash caused by a yeast infection and inflammation, often worsened by sweating.   -To treat this, you should apply nystatin  triamcinolone  cream twice daily for two weeks.   -If the rash clears, use Zeasorb antifungal powder to prevent it from coming back.   -If there is still some pinkness after two weeks, switch to econazole cream daily until the rash is completely gone.   INSTRUCTIONS:  Please follow the treatment plan as discussed. We have scheduled an annual follow-up appointment unless your symptoms worsen or the treatment becomes ineffective.       Important Information  Due to recent changes in healthcare laws, you may see results of your pathology and/or laboratory studies on MyChart before the doctors have had a chance to review them. We understand that in some cases there may be results that are confusing or concerning to you. Please understand that not all results are received at the same time and often the doctors may need to interpret multiple results in order to provide you with the best plan of care or course of treatment. Therefore, we ask that you please give us  2 business days to thoroughly review all your results before contacting the office for clarification. Should we see a critical lab result, you will be contacted sooner.   If You Need Anything After Your Visit  If you have any questions or concerns for your doctor, please call our main line at [phone] If no one answers, please leave a voicemail as directed and we will return your call as soon as possible. Messages left after 4 pm will be answered the following business day.   You may also  send us  a message via MyChart. We typically respond to MyChart messages within 1-2 business days.  For prescription refills, please ask your pharmacy to contact our office. Our fax number is [phone].  If you have an urgent issue when the clinic is closed that cannot wait until the next business day, you can page your doctor at the number below.    Please note that while we do our best to be available for urgent issues outside of office hours, we are not available 24/7.   If you have an urgent issue and are unable to reach us , you may choose to seek medical care at your doctor's office, retail clinic, urgent care center, or emergency room.  If you have a medical emergency, please immediately call 911 or go to the emergency department. In the event of inclement weather, please call our main line at [phone] for an update on the status of any delays or closures.  Dermatology Medication Tips: Please keep the boxes that topical medications come in in order to help keep track of the instructions about where and how to use these. Pharmacies typically print the medication instructions only on the boxes and not directly on the medication tubes.   If your medication is too expensive, please contact our office at [phone] or send us  a message through MyChart.   We are unable to tell what your co-pay for medications will be in advance as this is different depending on your insurance coverage. However, we may be able to find a  substitute medication at lower cost or fill out paperwork to get insurance to cover a needed medication.   If a prior authorization is required to get your medication covered by your insurance company, please allow us  1-2 business days to complete this process.  Drug prices often vary depending on where the prescription is filled and some pharmacies may offer cheaper prices.  The website www.goodrx.com contains coupons for medications through different pharmacies. The  prices here do not account for what the cost may be with help from insurance (it may be cheaper with your insurance), but the website can give you the price if you did not use any insurance.  - You can print the associated coupon and take it with your prescription to the pharmacy.  - You may also stop by our office during regular business hours and pick up a GoodRx coupon card.  - If you need your prescription sent electronically to a different pharmacy, notify our office through [facility] or by phone at [phone]     "

## 2024-11-05 ENCOUNTER — Ambulatory Visit: Payer: Medicare (Managed Care) | Admitting: Family Medicine

## 2024-11-05 ENCOUNTER — Encounter: Payer: Self-pay | Admitting: Dermatology

## 2024-11-06 ENCOUNTER — Ambulatory Visit: Payer: Medicare (Managed Care)

## 2024-11-06 ENCOUNTER — Other Ambulatory Visit: Payer: Self-pay

## 2024-11-06 DIAGNOSIS — L304 Erythema intertrigo: Secondary | ICD-10-CM

## 2024-11-06 MED ORDER — ECONAZOLE NITRATE 1 % EX CREA: TOPICAL_CREAM | Freq: Every day | CUTANEOUS | 9 refills | Status: AC

## 2024-11-06 MED ORDER — NYSTATIN-TRIAMCINOLONE 100000-0.1 UNIT/GM-% EX OINT: 1.0000 | TOPICAL_OINTMENT | Freq: Two times a day (BID) | CUTANEOUS | 11 refills | Status: AC

## 2024-11-13 ENCOUNTER — Ambulatory Visit: Payer: Medicare (Managed Care) | Admitting: Urology

## 2024-11-21 ENCOUNTER — Ambulatory Visit: Payer: Self-pay | Admitting: Family Medicine

## 2024-11-29 ENCOUNTER — Ambulatory Visit: Payer: Medicare (Managed Care) | Admitting: Podiatry

## 2024-12-31 ENCOUNTER — Ambulatory Visit: Payer: Medicare (Managed Care) | Admitting: Neurology

## 2025-03-11 ENCOUNTER — Ambulatory Visit: Payer: Medicare (Managed Care)

## 2025-03-12 ENCOUNTER — Ambulatory Visit: Payer: Medicare (Managed Care) | Admitting: Dermatology
